# Patient Record
Sex: Female | Born: 1935 | Race: White | Hispanic: No | Smoking: Never smoker
Health system: Southern US, Community
[De-identification: ages and names within clinical notes are randomized; demographics above are authoritative.]

## PROBLEM LIST (undated history)

## (undated) DIAGNOSIS — I10 Essential (primary) hypertension: Secondary | ICD-10-CM

## (undated) DIAGNOSIS — J209 Acute bronchitis, unspecified: Secondary | ICD-10-CM

## (undated) DIAGNOSIS — R51 Headache: Secondary | ICD-10-CM

## (undated) DIAGNOSIS — C679 Malignant neoplasm of bladder, unspecified: Secondary | ICD-10-CM

## (undated) DIAGNOSIS — H109 Unspecified conjunctivitis: Secondary | ICD-10-CM

## (undated) DIAGNOSIS — M1990 Unspecified osteoarthritis, unspecified site: Secondary | ICD-10-CM

## (undated) DIAGNOSIS — I739 Peripheral vascular disease, unspecified: Secondary | ICD-10-CM

## (undated) DIAGNOSIS — D649 Anemia, unspecified: Secondary | ICD-10-CM

## (undated) DIAGNOSIS — R634 Abnormal weight loss: Secondary | ICD-10-CM

## (undated) DIAGNOSIS — I4430 Unspecified atrioventricular block: Secondary | ICD-10-CM

## (undated) DIAGNOSIS — J329 Chronic sinusitis, unspecified: Secondary | ICD-10-CM

## (undated) DIAGNOSIS — R296 Repeated falls: Secondary | ICD-10-CM

## (undated) DIAGNOSIS — G4700 Insomnia, unspecified: Secondary | ICD-10-CM

## (undated) DIAGNOSIS — D469 Myelodysplastic syndrome, unspecified: Secondary | ICD-10-CM

## (undated) DIAGNOSIS — N390 Urinary tract infection, site not specified: Secondary | ICD-10-CM

## (undated) DIAGNOSIS — S72142A Displaced intertrochanteric fracture of left femur, initial encounter for closed fracture: Secondary | ICD-10-CM

## (undated) DIAGNOSIS — F329 Major depressive disorder, single episode, unspecified: Secondary | ICD-10-CM

## (undated) DIAGNOSIS — T7840XA Allergy, unspecified, initial encounter: Secondary | ICD-10-CM

## (undated) DIAGNOSIS — E785 Hyperlipidemia, unspecified: Secondary | ICD-10-CM

## (undated) DIAGNOSIS — N189 Chronic kidney disease, unspecified: Secondary | ICD-10-CM

## (undated) DIAGNOSIS — S2249XA Multiple fractures of ribs, unspecified side, initial encounter for closed fracture: Secondary | ICD-10-CM

## (undated) DIAGNOSIS — N183 Chronic kidney disease, stage 3 unspecified: Secondary | ICD-10-CM

## (undated) DIAGNOSIS — Z9841 Cataract extraction status, right eye: Secondary | ICD-10-CM

## (undated) DIAGNOSIS — F419 Anxiety disorder, unspecified: Secondary | ICD-10-CM

## (undated) DIAGNOSIS — E039 Hypothyroidism, unspecified: Secondary | ICD-10-CM

## (undated) DIAGNOSIS — I2510 Atherosclerotic heart disease of native coronary artery without angina pectoris: Secondary | ICD-10-CM

## (undated) DIAGNOSIS — I6529 Occlusion and stenosis of unspecified carotid artery: Secondary | ICD-10-CM

## (undated) HISTORY — PX: CHOLECYSTECTOMY: SHX55

## (undated) HISTORY — DX: Myelodysplastic syndrome, unspecified: D46.9

## (undated) HISTORY — DX: Multiple fractures of ribs, unspecified side, initial encounter for closed fracture: S22.49XA

## (undated) HISTORY — DX: Unspecified osteoarthritis, unspecified site: M19.90

## (undated) HISTORY — DX: Chronic kidney disease, unspecified: N18.9

## (undated) HISTORY — DX: Malignant neoplasm of bladder, unspecified: C67.9

## (undated) HISTORY — DX: Urinary tract infection, site not specified: N39.0

## (undated) HISTORY — DX: Chronic sinusitis, unspecified: J32.9

## (undated) HISTORY — DX: Unspecified conjunctivitis: H10.9

## (undated) HISTORY — DX: Headache: R51

## (undated) HISTORY — DX: Cataract extraction status, right eye: Z98.41

## (undated) HISTORY — DX: Occlusion and stenosis of unspecified carotid artery: I65.29

## (undated) HISTORY — PX: SHOULDER OPEN ROTATOR CUFF REPAIR: SHX2407

## (undated) HISTORY — DX: Allergy, unspecified, initial encounter: T78.40XA

## (undated) HISTORY — DX: Major depressive disorder, single episode, unspecified: F32.9

## (undated) HISTORY — PX: PACEMAKER INSERTION: SHX728

## (undated) HISTORY — DX: Hyperlipidemia, unspecified: E78.5

## (undated) HISTORY — DX: Essential (primary) hypertension: I10

## (undated) HISTORY — DX: Acute bronchitis, unspecified: J20.9

## (undated) HISTORY — DX: Insomnia, unspecified: G47.00

## (undated) HISTORY — DX: Peripheral vascular disease, unspecified: I73.9

## (undated) HISTORY — DX: Anemia, unspecified: D64.9

## (undated) HISTORY — PX: ABDOMINAL HYSTERECTOMY: SHX81

## (undated) HISTORY — DX: Hypothyroidism, unspecified: E03.9

## (undated) HISTORY — DX: Anxiety disorder, unspecified: F41.9

## (undated) HISTORY — PX: OTHER SURGICAL HISTORY: SHX169

## (undated) HISTORY — DX: Abnormal weight loss: R63.4

## (undated) HISTORY — DX: Atherosclerotic heart disease of native coronary artery without angina pectoris: I25.10

## (undated) HISTORY — PX: FOOT SURGERY: SHX648

---

## 1999-01-28 ENCOUNTER — Encounter: Payer: Self-pay | Admitting: Cardiology

## 1999-01-28 ENCOUNTER — Ambulatory Visit (HOSPITAL_COMMUNITY): Admission: RE | Admit: 1999-01-28 | Discharge: 1999-01-28 | Payer: Self-pay | Admitting: Cardiology

## 1999-05-04 ENCOUNTER — Emergency Department (HOSPITAL_COMMUNITY): Admission: EM | Admit: 1999-05-04 | Discharge: 1999-05-04 | Payer: Self-pay | Admitting: *Deleted

## 1999-09-29 ENCOUNTER — Encounter: Admission: RE | Admit: 1999-09-29 | Discharge: 1999-09-29 | Payer: Self-pay | Admitting: Family Medicine

## 2000-08-31 ENCOUNTER — Encounter: Payer: Self-pay | Admitting: Cardiology

## 2000-08-31 ENCOUNTER — Ambulatory Visit (HOSPITAL_COMMUNITY): Admission: RE | Admit: 2000-08-31 | Discharge: 2000-08-31 | Payer: Self-pay | Admitting: Cardiology

## 2000-09-11 ENCOUNTER — Emergency Department (HOSPITAL_COMMUNITY): Admission: EM | Admit: 2000-09-11 | Discharge: 2000-09-11 | Payer: Self-pay | Admitting: Emergency Medicine

## 2000-10-11 ENCOUNTER — Encounter: Admission: RE | Admit: 2000-10-11 | Discharge: 2000-10-11 | Payer: Self-pay | Admitting: Family Medicine

## 2000-10-11 ENCOUNTER — Encounter: Payer: Self-pay | Admitting: Otolaryngology

## 2001-03-28 ENCOUNTER — Encounter: Payer: Self-pay | Admitting: Emergency Medicine

## 2001-03-28 ENCOUNTER — Emergency Department (HOSPITAL_COMMUNITY): Admission: EM | Admit: 2001-03-28 | Discharge: 2001-03-28 | Payer: Self-pay | Admitting: Emergency Medicine

## 2001-08-08 ENCOUNTER — Encounter: Payer: Self-pay | Admitting: Orthopedic Surgery

## 2001-08-08 ENCOUNTER — Encounter: Admission: RE | Admit: 2001-08-08 | Discharge: 2001-08-08 | Payer: Self-pay | Admitting: Orthopedic Surgery

## 2001-08-10 ENCOUNTER — Ambulatory Visit (HOSPITAL_BASED_OUTPATIENT_CLINIC_OR_DEPARTMENT_OTHER): Admission: RE | Admit: 2001-08-10 | Discharge: 2001-08-11 | Payer: Self-pay | Admitting: Orthopedic Surgery

## 2001-11-21 ENCOUNTER — Encounter: Admission: RE | Admit: 2001-11-21 | Discharge: 2001-11-21 | Payer: Self-pay | Admitting: Urology

## 2001-11-21 ENCOUNTER — Encounter: Payer: Self-pay | Admitting: Urology

## 2001-12-07 ENCOUNTER — Encounter (INDEPENDENT_AMBULATORY_CARE_PROVIDER_SITE_OTHER): Payer: Self-pay | Admitting: Specialist

## 2001-12-07 ENCOUNTER — Ambulatory Visit (HOSPITAL_BASED_OUTPATIENT_CLINIC_OR_DEPARTMENT_OTHER): Admission: RE | Admit: 2001-12-07 | Discharge: 2001-12-07 | Payer: Self-pay | Admitting: Urology

## 2002-03-08 ENCOUNTER — Encounter (INDEPENDENT_AMBULATORY_CARE_PROVIDER_SITE_OTHER): Payer: Self-pay | Admitting: *Deleted

## 2002-03-08 ENCOUNTER — Ambulatory Visit (HOSPITAL_BASED_OUTPATIENT_CLINIC_OR_DEPARTMENT_OTHER): Admission: RE | Admit: 2002-03-08 | Discharge: 2002-03-08 | Payer: Self-pay | Admitting: Urology

## 2002-03-26 ENCOUNTER — Encounter: Admission: RE | Admit: 2002-03-26 | Discharge: 2002-03-26 | Payer: Self-pay | Admitting: Urology

## 2002-03-26 ENCOUNTER — Encounter: Payer: Self-pay | Admitting: Urology

## 2002-06-14 ENCOUNTER — Encounter: Admission: RE | Admit: 2002-06-14 | Discharge: 2002-06-14 | Payer: Self-pay | Admitting: Family Medicine

## 2002-06-14 ENCOUNTER — Encounter: Payer: Self-pay | Admitting: Family Medicine

## 2002-12-16 DIAGNOSIS — C679 Malignant neoplasm of bladder, unspecified: Secondary | ICD-10-CM

## 2002-12-16 HISTORY — DX: Malignant neoplasm of bladder, unspecified: C67.9

## 2002-12-27 ENCOUNTER — Encounter: Payer: Self-pay | Admitting: Urology

## 2002-12-27 ENCOUNTER — Encounter (INDEPENDENT_AMBULATORY_CARE_PROVIDER_SITE_OTHER): Payer: Self-pay | Admitting: Specialist

## 2002-12-27 ENCOUNTER — Ambulatory Visit (HOSPITAL_BASED_OUTPATIENT_CLINIC_OR_DEPARTMENT_OTHER): Admission: RE | Admit: 2002-12-27 | Discharge: 2002-12-27 | Payer: Self-pay | Admitting: Urology

## 2003-12-10 ENCOUNTER — Encounter: Admission: RE | Admit: 2003-12-10 | Discharge: 2003-12-10 | Payer: Self-pay | Admitting: Family Medicine

## 2004-06-22 ENCOUNTER — Ambulatory Visit: Payer: Self-pay | Admitting: Internal Medicine

## 2005-10-01 ENCOUNTER — Ambulatory Visit: Payer: Self-pay | Admitting: Internal Medicine

## 2005-12-10 ENCOUNTER — Ambulatory Visit: Payer: Self-pay | Admitting: Internal Medicine

## 2006-01-11 ENCOUNTER — Ambulatory Visit: Payer: Self-pay | Admitting: Internal Medicine

## 2006-01-18 ENCOUNTER — Ambulatory Visit: Payer: Self-pay | Admitting: Internal Medicine

## 2006-03-11 ENCOUNTER — Ambulatory Visit: Payer: Self-pay | Admitting: Internal Medicine

## 2007-06-27 ENCOUNTER — Ambulatory Visit (HOSPITAL_COMMUNITY): Admission: RE | Admit: 2007-06-27 | Discharge: 2007-06-27 | Payer: Self-pay | Admitting: Family Medicine

## 2007-07-08 ENCOUNTER — Emergency Department (HOSPITAL_COMMUNITY): Admission: EM | Admit: 2007-07-08 | Discharge: 2007-07-08 | Payer: Self-pay | Admitting: Emergency Medicine

## 2007-07-11 ENCOUNTER — Encounter: Admission: RE | Admit: 2007-07-11 | Discharge: 2007-07-11 | Payer: Self-pay | Admitting: Family Medicine

## 2007-08-18 ENCOUNTER — Ambulatory Visit (HOSPITAL_COMMUNITY): Admission: RE | Admit: 2007-08-18 | Discharge: 2007-08-18 | Payer: Self-pay | Admitting: Family Medicine

## 2007-10-23 ENCOUNTER — Ambulatory Visit: Payer: Self-pay | Admitting: Cardiology

## 2007-10-23 ENCOUNTER — Inpatient Hospital Stay (HOSPITAL_COMMUNITY): Admission: AD | Admit: 2007-10-23 | Discharge: 2007-10-30 | Payer: Self-pay | Admitting: Family Medicine

## 2007-10-23 ENCOUNTER — Ambulatory Visit: Payer: Self-pay | Admitting: Oncology

## 2007-10-27 ENCOUNTER — Ambulatory Visit: Payer: Self-pay | Admitting: Oncology

## 2007-10-27 ENCOUNTER — Encounter: Payer: Self-pay | Admitting: Gastroenterology

## 2007-11-01 ENCOUNTER — Ambulatory Visit: Payer: Self-pay | Admitting: Gastroenterology

## 2007-11-05 ENCOUNTER — Encounter: Payer: Self-pay | Admitting: Gastroenterology

## 2007-11-08 ENCOUNTER — Ambulatory Visit: Payer: Self-pay | Admitting: Cardiology

## 2007-11-13 ENCOUNTER — Encounter: Payer: Self-pay | Admitting: Gastroenterology

## 2007-11-13 ENCOUNTER — Ambulatory Visit: Payer: Self-pay

## 2007-11-13 LAB — CBC & DIFF AND RETIC
Basophils Absolute: 0 10*3/uL (ref 0.0–0.1)
Eosinophils Absolute: 0.5 10*3/uL (ref 0.0–0.5)
HGB: 10.1 g/dL — ABNORMAL LOW (ref 11.6–15.9)
IRF: 0.5 — ABNORMAL HIGH (ref 0.130–0.330)
MCV: 101.7 fL — ABNORMAL HIGH (ref 81.0–101.0)
MONO#: 0.9 10*3/uL (ref 0.1–0.9)
NEUT#: 5.4 10*3/uL (ref 1.5–6.5)
RDW: 14.3 % (ref 11.3–14.5)
RETIC #: 107.8 10*3/uL (ref 19.7–115.1)
Retic %: 3.8 % — ABNORMAL HIGH (ref 0.4–2.3)
WBC: 8.1 10*3/uL (ref 3.9–10.0)
lymph#: 1.3 10*3/uL (ref 0.9–3.3)

## 2007-11-13 LAB — COMPREHENSIVE METABOLIC PANEL
ALT: 21 U/L (ref 0–35)
AST: 21 U/L (ref 0–37)
Alkaline Phosphatase: 40 U/L (ref 39–117)
CO2: 23 mEq/L (ref 19–32)
Creatinine, Ser: 1.46 mg/dL — ABNORMAL HIGH (ref 0.40–1.20)
Sodium: 140 mEq/L (ref 135–145)
Total Bilirubin: 0.4 mg/dL (ref 0.3–1.2)
Total Protein: 7.2 g/dL (ref 6.0–8.3)

## 2007-11-13 LAB — ERYTHROCYTE SEDIMENTATION RATE: Sed Rate: 49 mm/hr — ABNORMAL HIGH (ref 0–30)

## 2007-11-13 LAB — MORPHOLOGY

## 2007-11-13 LAB — CHCC SMEAR

## 2007-11-15 ENCOUNTER — Emergency Department (HOSPITAL_COMMUNITY): Admission: EM | Admit: 2007-11-15 | Discharge: 2007-11-15 | Payer: Self-pay | Admitting: Emergency Medicine

## 2007-11-23 ENCOUNTER — Other Ambulatory Visit: Admission: RE | Admit: 2007-11-23 | Discharge: 2007-11-23 | Payer: Self-pay | Admitting: Oncology

## 2007-11-23 ENCOUNTER — Encounter: Payer: Self-pay | Admitting: Oncology

## 2007-12-10 ENCOUNTER — Inpatient Hospital Stay (HOSPITAL_COMMUNITY): Admission: EM | Admit: 2007-12-10 | Discharge: 2007-12-11 | Payer: Self-pay | Admitting: Emergency Medicine

## 2007-12-10 ENCOUNTER — Ambulatory Visit: Payer: Self-pay | Admitting: *Deleted

## 2007-12-15 ENCOUNTER — Ambulatory Visit: Payer: Self-pay | Admitting: Oncology

## 2007-12-15 ENCOUNTER — Encounter: Payer: Self-pay | Admitting: Gastroenterology

## 2007-12-15 LAB — MORPHOLOGY

## 2007-12-15 LAB — CBC & DIFF AND RETIC
BASO%: 0.2 % (ref 0.0–2.0)
Eosinophils Absolute: 0.2 10*3/uL (ref 0.0–0.5)
MCV: 102.5 fL — ABNORMAL HIGH (ref 81.0–101.0)
MONO%: 9.2 % (ref 0.0–13.0)
NEUT#: 5.1 10*3/uL (ref 1.5–6.5)
RBC: 2.81 10*6/uL — ABNORMAL LOW (ref 3.70–5.32)
RDW: 14.9 % — ABNORMAL HIGH (ref 11.3–14.5)
RETIC #: 75.3 10*3/uL (ref 19.7–115.1)
Retic %: 2.7 % — ABNORMAL HIGH (ref 0.4–2.3)
WBC: 7.1 10*3/uL (ref 3.9–10.0)

## 2008-01-19 ENCOUNTER — Encounter: Payer: Self-pay | Admitting: Gastroenterology

## 2008-01-19 LAB — CBC & DIFF AND RETIC
Eosinophils Absolute: 0.2 10*3/uL (ref 0.0–0.5)
IRF: 0.35 — ABNORMAL HIGH (ref 0.130–0.330)
MCV: 100.3 fL (ref 81.0–101.0)
MONO#: 0.6 10*3/uL (ref 0.1–0.9)
MONO%: 9.6 % (ref 0.0–13.0)
NEUT#: 4.8 10*3/uL (ref 1.5–6.5)
RBC: 3.09 10*6/uL — ABNORMAL LOW (ref 3.70–5.32)
RDW: 14.7 % — ABNORMAL HIGH (ref 11.3–14.5)
RETIC #: 61.2 10*3/uL (ref 19.7–115.1)
Retic %: 2 % (ref 0.4–2.3)
WBC: 6.8 10*3/uL (ref 3.9–10.0)
lymph#: 1.1 10*3/uL (ref 0.9–3.3)

## 2008-01-19 LAB — MORPHOLOGY: PLT EST: DECREASED

## 2008-01-30 ENCOUNTER — Ambulatory Visit: Payer: Self-pay | Admitting: Internal Medicine

## 2008-02-04 ENCOUNTER — Emergency Department (HOSPITAL_BASED_OUTPATIENT_CLINIC_OR_DEPARTMENT_OTHER): Admission: EM | Admit: 2008-02-04 | Discharge: 2008-02-04 | Payer: Self-pay | Admitting: Emergency Medicine

## 2008-02-13 ENCOUNTER — Ambulatory Visit: Payer: Self-pay | Admitting: Cardiology

## 2008-02-21 ENCOUNTER — Ambulatory Visit: Payer: Self-pay | Admitting: Oncology

## 2008-02-23 LAB — CBC WITH DIFFERENTIAL/PLATELET
Basophils Absolute: 0 10*3/uL (ref 0.0–0.1)
Eosinophils Absolute: 0.2 10*3/uL (ref 0.0–0.5)
HCT: 30.8 % — ABNORMAL LOW (ref 34.8–46.6)
HGB: 10.3 g/dL — ABNORMAL LOW (ref 11.6–15.9)
MONO#: 0.6 10*3/uL (ref 0.1–0.9)
NEUT#: 5.9 10*3/uL (ref 1.5–6.5)
NEUT%: 73.8 % (ref 39.6–76.8)
RDW: 14.9 % — ABNORMAL HIGH (ref 11.3–14.5)
lymph#: 1.2 10*3/uL (ref 0.9–3.3)

## 2008-04-05 LAB — CBC WITH DIFFERENTIAL/PLATELET
Eosinophils Absolute: 0.4 10*3/uL (ref 0.0–0.5)
HCT: 31 % — ABNORMAL LOW (ref 34.8–46.6)
HGB: 10.5 g/dL — ABNORMAL LOW (ref 11.6–15.9)
LYMPH%: 22.4 % (ref 14.0–48.0)
MONO#: 0.6 10*3/uL (ref 0.1–0.9)
NEUT#: 4.4 10*3/uL (ref 1.5–6.5)
NEUT%: 62.5 % (ref 39.6–76.8)
Platelets: 150 10*3/uL (ref 145–400)
WBC: 7.1 10*3/uL (ref 3.9–10.0)
lymph#: 1.6 10*3/uL (ref 0.9–3.3)

## 2008-04-05 LAB — MORPHOLOGY

## 2008-04-05 LAB — IRON AND TIBC: %SAT: 18 % — ABNORMAL LOW (ref 20–55)

## 2008-04-05 LAB — FERRITIN: Ferritin: 32 ng/mL (ref 10–291)

## 2008-05-06 ENCOUNTER — Ambulatory Visit (HOSPITAL_COMMUNITY): Admission: RE | Admit: 2008-05-06 | Discharge: 2008-05-06 | Payer: Self-pay | Admitting: Family Medicine

## 2008-06-28 ENCOUNTER — Ambulatory Visit: Payer: Self-pay | Admitting: Oncology

## 2008-07-02 LAB — CBC WITH DIFFERENTIAL/PLATELET
Basophils Absolute: 0 10*3/uL (ref 0.0–0.1)
EOS%: 3.4 % (ref 0.0–7.0)
Eosinophils Absolute: 0.3 10*3/uL (ref 0.0–0.5)
HCT: 34.5 % — ABNORMAL LOW (ref 34.8–46.6)
HGB: 11.4 g/dL — ABNORMAL LOW (ref 11.6–15.9)
LYMPH%: 16.6 % (ref 14.0–48.0)
MCH: 33.9 pg (ref 26.0–34.0)
MCV: 102.5 fL — ABNORMAL HIGH (ref 81.0–101.0)
MONO%: 7.4 % (ref 0.0–13.0)
NEUT%: 72.4 % (ref 39.6–76.8)
Platelets: 151 10*3/uL (ref 145–400)
RDW: 14.1 % (ref 11.3–14.5)

## 2008-07-02 LAB — MORPHOLOGY: PLT EST: ADEQUATE

## 2008-08-30 ENCOUNTER — Encounter (INDEPENDENT_AMBULATORY_CARE_PROVIDER_SITE_OTHER): Payer: Self-pay | Admitting: *Deleted

## 2008-09-20 ENCOUNTER — Ambulatory Visit: Payer: Self-pay | Admitting: Oncology

## 2008-09-24 LAB — CBC & DIFF AND RETIC
BASO%: 0.4 % (ref 0.0–2.0)
LYMPH%: 13.7 % — ABNORMAL LOW (ref 14.0–49.7)
MCHC: 33.2 g/dL (ref 31.5–36.0)
MCV: 101.8 fL — ABNORMAL HIGH (ref 79.5–101.0)
MONO%: 6.3 % (ref 0.0–14.0)
Platelets: 155 10*3/uL (ref 145–400)
RBC: 3.24 10*6/uL — ABNORMAL LOW (ref 3.70–5.45)
Retic %: 1.9 % (ref 0.4–2.3)

## 2008-09-24 LAB — MORPHOLOGY: RBC Comments: NORMAL

## 2008-09-25 LAB — FERRITIN: Ferritin: 37 ng/mL (ref 10–291)

## 2008-09-25 LAB — COMPREHENSIVE METABOLIC PANEL
Albumin: 4.4 g/dL (ref 3.5–5.2)
Alkaline Phosphatase: 38 U/L — ABNORMAL LOW (ref 39–117)
BUN: 18 mg/dL (ref 6–23)
CO2: 22 mEq/L (ref 19–32)
Glucose, Bld: 167 mg/dL — ABNORMAL HIGH (ref 70–99)
Potassium: 4.1 mEq/L (ref 3.5–5.3)

## 2008-09-25 LAB — IRON AND TIBC
Iron: 71 ug/dL (ref 42–145)
UIBC: 278 ug/dL

## 2008-09-25 LAB — LACTATE DEHYDROGENASE: LDH: 186 U/L (ref 94–250)

## 2008-09-27 ENCOUNTER — Encounter: Payer: Self-pay | Admitting: Cardiology

## 2008-10-08 ENCOUNTER — Ambulatory Visit (HOSPITAL_COMMUNITY): Admission: RE | Admit: 2008-10-08 | Discharge: 2008-10-08 | Payer: Self-pay | Admitting: Oncology

## 2008-10-25 ENCOUNTER — Encounter: Payer: Self-pay | Admitting: Cardiology

## 2008-10-31 DIAGNOSIS — N189 Chronic kidney disease, unspecified: Secondary | ICD-10-CM | POA: Insufficient documentation

## 2008-10-31 DIAGNOSIS — E785 Hyperlipidemia, unspecified: Secondary | ICD-10-CM | POA: Insufficient documentation

## 2008-10-31 DIAGNOSIS — I739 Peripheral vascular disease, unspecified: Secondary | ICD-10-CM

## 2008-10-31 DIAGNOSIS — I10 Essential (primary) hypertension: Secondary | ICD-10-CM | POA: Insufficient documentation

## 2008-10-31 DIAGNOSIS — I6529 Occlusion and stenosis of unspecified carotid artery: Secondary | ICD-10-CM

## 2008-10-31 DIAGNOSIS — E039 Hypothyroidism, unspecified: Secondary | ICD-10-CM | POA: Insufficient documentation

## 2008-11-03 DIAGNOSIS — I951 Orthostatic hypotension: Secondary | ICD-10-CM | POA: Insufficient documentation

## 2008-11-05 ENCOUNTER — Ambulatory Visit: Payer: Self-pay | Admitting: Internal Medicine

## 2008-11-05 DIAGNOSIS — I472 Ventricular tachycardia: Secondary | ICD-10-CM

## 2008-11-05 DIAGNOSIS — I442 Atrioventricular block, complete: Secondary | ICD-10-CM | POA: Insufficient documentation

## 2008-11-13 ENCOUNTER — Telehealth (INDEPENDENT_AMBULATORY_CARE_PROVIDER_SITE_OTHER): Payer: Self-pay | Admitting: *Deleted

## 2008-11-14 ENCOUNTER — Encounter: Payer: Self-pay | Admitting: Internal Medicine

## 2008-11-14 ENCOUNTER — Ambulatory Visit: Payer: Self-pay

## 2008-12-16 ENCOUNTER — Emergency Department (HOSPITAL_BASED_OUTPATIENT_CLINIC_OR_DEPARTMENT_OTHER): Admission: EM | Admit: 2008-12-16 | Discharge: 2008-12-16 | Payer: Self-pay | Admitting: Emergency Medicine

## 2008-12-16 ENCOUNTER — Ambulatory Visit: Payer: Self-pay | Admitting: Diagnostic Radiology

## 2008-12-20 ENCOUNTER — Ambulatory Visit: Payer: Self-pay | Admitting: Oncology

## 2008-12-24 LAB — CBC WITH DIFFERENTIAL/PLATELET
BASO%: 0.2 % (ref 0.0–2.0)
EOS%: 4.4 % (ref 0.0–7.0)
LYMPH%: 13.6 % — ABNORMAL LOW (ref 14.0–49.7)
MCHC: 33.2 g/dL (ref 31.5–36.0)
MCV: 99.8 fL (ref 79.5–101.0)
MONO%: 7.2 % (ref 0.0–14.0)
Platelets: 168 10*3/uL (ref 145–400)
RBC: 2.91 10*6/uL — ABNORMAL LOW (ref 3.70–5.45)
WBC: 8.6 10*3/uL (ref 3.9–10.3)

## 2009-02-03 ENCOUNTER — Telehealth (INDEPENDENT_AMBULATORY_CARE_PROVIDER_SITE_OTHER): Payer: Self-pay | Admitting: *Deleted

## 2009-02-21 ENCOUNTER — Encounter (INDEPENDENT_AMBULATORY_CARE_PROVIDER_SITE_OTHER): Payer: Self-pay | Admitting: *Deleted

## 2009-03-18 ENCOUNTER — Encounter (INDEPENDENT_AMBULATORY_CARE_PROVIDER_SITE_OTHER): Payer: Self-pay | Admitting: *Deleted

## 2009-03-24 ENCOUNTER — Ambulatory Visit: Payer: Self-pay | Admitting: Oncology

## 2009-03-26 ENCOUNTER — Encounter: Payer: Self-pay | Admitting: Cardiology

## 2009-03-26 LAB — COMPREHENSIVE METABOLIC PANEL
ALT: 15 U/L (ref 0–35)
AST: 16 U/L (ref 0–37)
Albumin: 4.2 g/dL (ref 3.5–5.2)
Alkaline Phosphatase: 36 U/L — ABNORMAL LOW (ref 39–117)
BUN: 11 mg/dL (ref 6–23)
Potassium: 3.8 mEq/L (ref 3.5–5.3)
Sodium: 142 mEq/L (ref 135–145)

## 2009-03-26 LAB — CBC WITH DIFFERENTIAL/PLATELET
BASO%: 0.3 % (ref 0.0–2.0)
Basophils Absolute: 0 10*3/uL (ref 0.0–0.1)
EOS%: 3.7 % (ref 0.0–7.0)
MCH: 32.6 pg (ref 25.1–34.0)
MCHC: 32.4 g/dL (ref 31.5–36.0)
MCV: 100.7 fL (ref 79.5–101.0)
MONO%: 8.1 % (ref 0.0–14.0)
RBC: 2.92 10*6/uL — ABNORMAL LOW (ref 3.70–5.45)
RDW: 15.4 % — ABNORMAL HIGH (ref 11.2–14.5)

## 2009-04-02 ENCOUNTER — Encounter: Payer: Self-pay | Admitting: Internal Medicine

## 2009-04-02 ENCOUNTER — Ambulatory Visit: Payer: Self-pay

## 2009-04-09 LAB — CBC WITH DIFFERENTIAL/PLATELET
Basophils Absolute: 0 10*3/uL (ref 0.0–0.1)
Eosinophils Absolute: 0.3 10*3/uL (ref 0.0–0.5)
LYMPH%: 17.3 % (ref 14.0–49.7)
MCV: 99 fL (ref 79.5–101.0)
MONO%: 8.8 % (ref 0.0–14.0)
NEUT#: 5.4 10*3/uL (ref 1.5–6.5)
Platelets: 134 10*3/uL — ABNORMAL LOW (ref 145–400)
RBC: 2.96 10*6/uL — ABNORMAL LOW (ref 3.70–5.45)

## 2009-04-23 ENCOUNTER — Ambulatory Visit: Payer: Self-pay | Admitting: Oncology

## 2009-04-23 LAB — CBC WITH DIFFERENTIAL/PLATELET
Basophils Absolute: 0 10*3/uL (ref 0.0–0.1)
EOS%: 5.1 % (ref 0.0–7.0)
HCT: 30.9 % — ABNORMAL LOW (ref 34.8–46.6)
HGB: 9.5 g/dL — ABNORMAL LOW (ref 11.6–15.9)
LYMPH%: 18.2 % (ref 14.0–49.7)
MCH: 30.2 pg (ref 25.1–34.0)
MCHC: 30.7 g/dL — ABNORMAL LOW (ref 31.5–36.0)
MONO#: 0.8 10*3/uL (ref 0.1–0.9)
NEUT%: 65.1 % (ref 38.4–76.8)
Platelets: 139 10*3/uL — ABNORMAL LOW (ref 145–400)
lymph#: 1.3 10*3/uL (ref 0.9–3.3)

## 2009-05-07 LAB — CBC WITH DIFFERENTIAL/PLATELET
BASO%: 0.4 % (ref 0.0–2.0)
Basophils Absolute: 0 10*3/uL (ref 0.0–0.1)
EOS%: 5.1 % (ref 0.0–7.0)
Eosinophils Absolute: 0.4 10*3/uL (ref 0.0–0.5)
HCT: 32.9 % — ABNORMAL LOW (ref 34.8–46.6)
HGB: 10 g/dL — ABNORMAL LOW (ref 11.6–15.9)
LYMPH%: 13.5 % — ABNORMAL LOW (ref 14.0–49.7)
MCH: 29.3 pg (ref 25.1–34.0)
MCHC: 30.4 g/dL — ABNORMAL LOW (ref 31.5–36.0)
MCV: 96.5 fL (ref 79.5–101.0)
MONO#: 0.7 10*3/uL (ref 0.1–0.9)
MONO%: 8.2 % (ref 0.0–14.0)
NEUT#: 6.1 10*3/uL (ref 1.5–6.5)
NEUT%: 72.8 % (ref 38.4–76.8)
Platelets: 162 10*3/uL (ref 145–400)
RBC: 3.41 10*6/uL — ABNORMAL LOW (ref 3.70–5.45)
RDW: 15.1 % — ABNORMAL HIGH (ref 11.2–14.5)
WBC: 8.4 10*3/uL (ref 3.9–10.3)
lymph#: 1.1 10*3/uL (ref 0.9–3.3)

## 2009-05-15 ENCOUNTER — Ambulatory Visit: Payer: Self-pay | Admitting: Oncology

## 2009-05-21 LAB — CBC WITH DIFFERENTIAL/PLATELET
BASO%: 0.2 % (ref 0.0–2.0)
EOS%: 5.1 % (ref 0.0–7.0)
HCT: 34.3 % — ABNORMAL LOW (ref 34.8–46.6)
HGB: 10.7 g/dL — ABNORMAL LOW (ref 11.6–15.9)
MCH: 29.6 pg (ref 25.1–34.0)
MONO#: 0.8 10*3/uL (ref 0.1–0.9)
NEUT#: 6.2 10*3/uL (ref 1.5–6.5)
NEUT%: 69.1 % (ref 38.4–76.8)
RDW: 15.7 % — ABNORMAL HIGH (ref 11.2–14.5)
lymph#: 1.6 10*3/uL (ref 0.9–3.3)

## 2009-06-04 LAB — CBC WITH DIFFERENTIAL/PLATELET
Basophils Absolute: 0 10*3/uL (ref 0.0–0.1)
EOS%: 6.1 % (ref 0.0–7.0)
Eosinophils Absolute: 0.5 10*3/uL (ref 0.0–0.5)
HCT: 29.5 % — ABNORMAL LOW (ref 34.8–46.6)
HGB: 9.6 g/dL — ABNORMAL LOW (ref 11.6–15.9)
MCH: 30.6 pg (ref 25.1–34.0)
MCV: 94.3 fL (ref 79.5–101.0)
NEUT#: 6.6 10*3/uL — ABNORMAL HIGH (ref 1.5–6.5)
NEUT%: 73.8 % (ref 38.4–76.8)
lymph#: 1.2 10*3/uL (ref 0.9–3.3)

## 2009-06-16 ENCOUNTER — Ambulatory Visit: Payer: Self-pay | Admitting: Oncology

## 2009-06-18 LAB — CBC WITH DIFFERENTIAL/PLATELET
Basophils Absolute: 0 10*3/uL (ref 0.0–0.1)
EOS%: 5.3 % (ref 0.0–7.0)
HGB: 10.2 g/dL — ABNORMAL LOW (ref 11.6–15.9)
LYMPH%: 12 % — ABNORMAL LOW (ref 14.0–49.7)
MCH: 28.7 pg (ref 25.1–34.0)
MCV: 95.8 fL (ref 79.5–101.0)
MONO%: 9.4 % (ref 0.0–14.0)
NEUT%: 73.1 % (ref 38.4–76.8)
Platelets: 157 10*3/uL (ref 145–400)
RDW: 16.8 % — ABNORMAL HIGH (ref 11.2–14.5)

## 2009-06-25 ENCOUNTER — Encounter: Payer: Self-pay | Admitting: Cardiology

## 2009-07-02 ENCOUNTER — Encounter: Payer: Self-pay | Admitting: Cardiology

## 2009-07-02 LAB — CBC WITH DIFFERENTIAL/PLATELET
BASO%: 0.3 % (ref 0.0–2.0)
EOS%: 4.1 % (ref 0.0–7.0)
HCT: 34 % — ABNORMAL LOW (ref 34.8–46.6)
LYMPH%: 18.6 % (ref 14.0–49.7)
MCH: 30.3 pg (ref 25.1–34.0)
MCHC: 32.6 g/dL (ref 31.5–36.0)
MCV: 93.1 fL (ref 79.5–101.0)
MONO%: 9.5 % (ref 0.0–14.0)
NEUT%: 67.5 % (ref 38.4–76.8)
Platelets: 190 10*3/uL (ref 145–400)
lymph#: 1.4 10*3/uL (ref 0.9–3.3)

## 2009-07-14 ENCOUNTER — Ambulatory Visit: Payer: Self-pay | Admitting: Oncology

## 2009-07-16 ENCOUNTER — Encounter: Payer: Self-pay | Admitting: Cardiology

## 2009-07-16 LAB — CBC WITH DIFFERENTIAL/PLATELET
BASO%: 0.3 % (ref 0.0–2.0)
Basophils Absolute: 0 10*3/uL (ref 0.0–0.1)
HCT: 37.5 % (ref 34.8–46.6)
LYMPH%: 19.2 % (ref 14.0–49.7)
MCH: 29.2 pg (ref 25.1–34.0)
MCHC: 30.7 g/dL — ABNORMAL LOW (ref 31.5–36.0)
MONO#: 1.1 10*3/uL — ABNORMAL HIGH (ref 0.1–0.9)
NEUT%: 63 % (ref 38.4–76.8)
Platelets: 112 10*3/uL — ABNORMAL LOW (ref 145–400)
WBC: 8.7 10*3/uL (ref 3.9–10.3)

## 2009-09-09 ENCOUNTER — Ambulatory Visit: Payer: Self-pay | Admitting: Oncology

## 2009-09-12 ENCOUNTER — Encounter: Payer: Self-pay | Admitting: Cardiology

## 2009-09-16 ENCOUNTER — Encounter: Payer: Self-pay | Admitting: Internal Medicine

## 2009-09-16 LAB — CBC WITH DIFFERENTIAL/PLATELET
EOS%: 4.2 % (ref 0.0–7.0)
Eosinophils Absolute: 0.3 10*3/uL (ref 0.0–0.5)
MCV: 101.2 fL — ABNORMAL HIGH (ref 79.5–101.0)
MONO%: 9.6 % (ref 0.0–14.0)
NEUT#: 4.6 10*3/uL (ref 1.5–6.5)
RBC: 2.68 10*6/uL — ABNORMAL LOW (ref 3.70–5.45)
RDW: 18.4 % — ABNORMAL HIGH (ref 11.2–14.5)

## 2009-09-26 ENCOUNTER — Encounter: Payer: Self-pay | Admitting: Cardiology

## 2009-09-30 ENCOUNTER — Encounter: Payer: Self-pay | Admitting: Cardiology

## 2009-09-30 LAB — CBC WITH DIFFERENTIAL/PLATELET
Eosinophils Absolute: 0.4 10*3/uL (ref 0.0–0.5)
MONO#: 0.7 10*3/uL (ref 0.1–0.9)
NEUT#: 4.9 10*3/uL (ref 1.5–6.5)
RBC: 3.04 10*6/uL — ABNORMAL LOW (ref 3.70–5.45)
RDW: 15.7 % — ABNORMAL HIGH (ref 11.2–14.5)
WBC: 7.1 10*3/uL (ref 3.9–10.3)

## 2009-10-10 ENCOUNTER — Ambulatory Visit: Payer: Self-pay | Admitting: Oncology

## 2009-10-14 LAB — CBC WITH DIFFERENTIAL/PLATELET
Eosinophils Absolute: 0.3 10*3/uL (ref 0.0–0.5)
HCT: 31.9 % — ABNORMAL LOW (ref 34.8–46.6)
LYMPH%: 14.8 % (ref 14.0–49.7)
MONO#: 1 10*3/uL — ABNORMAL HIGH (ref 0.1–0.9)
NEUT#: 6.7 10*3/uL — ABNORMAL HIGH (ref 1.5–6.5)
NEUT%: 70.4 % (ref 38.4–76.8)
Platelets: 133 10*3/uL — ABNORMAL LOW (ref 145–400)
WBC: 9.5 10*3/uL (ref 3.9–10.3)

## 2009-10-28 LAB — CBC WITH DIFFERENTIAL/PLATELET
Basophils Absolute: 0.1 10*3/uL (ref 0.0–0.1)
EOS%: 4.9 % (ref 0.0–7.0)
HCT: 28.9 % — ABNORMAL LOW (ref 34.8–46.6)
HGB: 9.5 g/dL — ABNORMAL LOW (ref 11.6–15.9)
LYMPH%: 13.5 % — ABNORMAL LOW (ref 14.0–49.7)
MCH: 32.3 pg (ref 25.1–34.0)
MCHC: 32.8 g/dL (ref 31.5–36.0)
MCV: 98.2 fL (ref 79.5–101.0)
MONO%: 7.2 % (ref 0.0–14.0)
NEUT%: 73.8 % (ref 38.4–76.8)
Platelets: 141 10*3/uL — ABNORMAL LOW (ref 145–400)

## 2009-10-30 ENCOUNTER — Encounter: Payer: Self-pay | Admitting: Cardiology

## 2009-10-31 ENCOUNTER — Ambulatory Visit: Payer: Self-pay | Admitting: Internal Medicine

## 2009-11-11 ENCOUNTER — Ambulatory Visit: Payer: Self-pay | Admitting: Oncology

## 2009-11-11 LAB — CBC WITH DIFFERENTIAL/PLATELET
BASO%: 0.4 % (ref 0.0–2.0)
Basophils Absolute: 0 10*3/uL (ref 0.0–0.1)
EOS%: 5.3 % (ref 0.0–7.0)
HGB: 10.1 g/dL — ABNORMAL LOW (ref 11.6–15.9)
MCH: 32.1 pg (ref 25.1–34.0)
MCHC: 33.2 g/dL (ref 31.5–36.0)
MCV: 96.7 fL (ref 79.5–101.0)
MONO%: 8.2 % (ref 0.0–14.0)
RBC: 3.14 10*6/uL — ABNORMAL LOW (ref 3.70–5.45)
RDW: 16 % — ABNORMAL HIGH (ref 11.2–14.5)

## 2009-11-12 ENCOUNTER — Encounter: Admission: RE | Admit: 2009-11-12 | Discharge: 2009-11-12 | Payer: Self-pay | Admitting: Family Medicine

## 2009-11-25 LAB — CBC WITH DIFFERENTIAL/PLATELET
Basophils Absolute: 0 10*3/uL (ref 0.0–0.1)
Eosinophils Absolute: 0.1 10*3/uL (ref 0.0–0.5)
HGB: 10.2 g/dL — ABNORMAL LOW (ref 11.6–15.9)
LYMPH%: 13.8 % — ABNORMAL LOW (ref 14.0–49.7)
MCV: 97.2 fL (ref 79.5–101.0)
MONO#: 1.1 10*3/uL — ABNORMAL HIGH (ref 0.1–0.9)
NEUT#: 7.3 10*3/uL — ABNORMAL HIGH (ref 1.5–6.5)
Platelets: 197 10*3/uL (ref 145–400)
RBC: 3.25 10*6/uL — ABNORMAL LOW (ref 3.70–5.45)
RDW: 16.7 % — ABNORMAL HIGH (ref 11.2–14.5)
WBC: 9.9 10*3/uL (ref 3.9–10.3)

## 2009-12-09 LAB — CBC WITH DIFFERENTIAL/PLATELET
Eosinophils Absolute: 0.2 10*3/uL (ref 0.0–0.5)
HCT: 32.2 % — ABNORMAL LOW (ref 34.8–46.6)
LYMPH%: 11.1 % — ABNORMAL LOW (ref 14.0–49.7)
MCV: 98.2 fL (ref 79.5–101.0)
MONO#: 1.2 10*3/uL — ABNORMAL HIGH (ref 0.1–0.9)
MONO%: 11 % (ref 0.0–14.0)
NEUT#: 8.5 10*3/uL — ABNORMAL HIGH (ref 1.5–6.5)
NEUT%: 75.7 % (ref 38.4–76.8)
Platelets: 150 10*3/uL (ref 145–400)
WBC: 11.2 10*3/uL — ABNORMAL HIGH (ref 3.9–10.3)

## 2009-12-19 ENCOUNTER — Ambulatory Visit: Payer: Self-pay | Admitting: Oncology

## 2009-12-23 LAB — CBC WITH DIFFERENTIAL/PLATELET
BASO%: 0.5 % (ref 0.0–2.0)
Basophils Absolute: 0 10*3/uL (ref 0.0–0.1)
EOS%: 3.2 % (ref 0.0–7.0)
HGB: 9.6 g/dL — ABNORMAL LOW (ref 11.6–15.9)
MCH: 29 pg (ref 25.1–34.0)
MCHC: 30.4 g/dL — ABNORMAL LOW (ref 31.5–36.0)
MCV: 95.5 fL (ref 79.5–101.0)
MONO%: 10.7 % (ref 0.0–14.0)
RBC: 3.31 10*6/uL — ABNORMAL LOW (ref 3.70–5.45)
RDW: 16.5 % — ABNORMAL HIGH (ref 11.2–14.5)
lymph#: 1.3 10*3/uL (ref 0.9–3.3)

## 2009-12-26 ENCOUNTER — Telehealth: Payer: Self-pay | Admitting: Internal Medicine

## 2009-12-29 ENCOUNTER — Ambulatory Visit: Payer: Self-pay

## 2009-12-29 ENCOUNTER — Encounter: Payer: Self-pay | Admitting: Internal Medicine

## 2009-12-29 ENCOUNTER — Telehealth (INDEPENDENT_AMBULATORY_CARE_PROVIDER_SITE_OTHER): Payer: Self-pay | Admitting: *Deleted

## 2010-01-05 ENCOUNTER — Ambulatory Visit: Payer: Self-pay | Admitting: Internal Medicine

## 2010-01-05 DIAGNOSIS — IMO0002 Reserved for concepts with insufficient information to code with codable children: Secondary | ICD-10-CM

## 2010-01-06 LAB — CBC WITH DIFFERENTIAL/PLATELET
Basophils Absolute: 0 10*3/uL (ref 0.0–0.1)
Eosinophils Absolute: 0.4 10*3/uL (ref 0.0–0.5)
HGB: 10.5 g/dL — ABNORMAL LOW (ref 11.6–15.9)
MCV: 95.2 fL (ref 79.5–101.0)
MONO#: 0.9 10*3/uL (ref 0.1–0.9)
MONO%: 9.9 % (ref 0.0–14.0)
NEUT#: 5.8 10*3/uL (ref 1.5–6.5)
RBC: 3.54 10*6/uL — ABNORMAL LOW (ref 3.70–5.45)
RDW: 17.2 % — ABNORMAL HIGH (ref 11.2–14.5)
WBC: 8.6 10*3/uL (ref 3.9–10.3)
lymph#: 1.6 10*3/uL (ref 0.9–3.3)

## 2010-01-20 ENCOUNTER — Ambulatory Visit: Payer: Self-pay | Admitting: Oncology

## 2010-01-20 LAB — CBC WITH DIFFERENTIAL/PLATELET
BASO%: 0.2 % (ref 0.0–2.0)
Basophils Absolute: 0 10*3/uL (ref 0.0–0.1)
EOS%: 5.5 % (ref 0.0–7.0)
HCT: 30.1 % — ABNORMAL LOW (ref 34.8–46.6)
HGB: 9.8 g/dL — ABNORMAL LOW (ref 11.6–15.9)
MCH: 30.4 pg (ref 25.1–34.0)
MCHC: 32.4 g/dL (ref 31.5–36.0)
MONO#: 0.7 10*3/uL (ref 0.1–0.9)
RDW: 18.6 % — ABNORMAL HIGH (ref 11.2–14.5)
WBC: 9.3 10*3/uL (ref 3.9–10.3)
lymph#: 1.4 10*3/uL (ref 0.9–3.3)

## 2010-01-20 LAB — MORPHOLOGY

## 2010-01-20 LAB — COMPREHENSIVE METABOLIC PANEL
Alkaline Phosphatase: 28 U/L — ABNORMAL LOW (ref 39–117)
BUN: 21 mg/dL (ref 6–23)
CO2: 29 mEq/L (ref 19–32)
Creatinine, Ser: 1.29 mg/dL — ABNORMAL HIGH (ref 0.40–1.20)
Glucose, Bld: 88 mg/dL (ref 70–99)
Total Bilirubin: 0.6 mg/dL (ref 0.3–1.2)

## 2010-01-20 LAB — LACTATE DEHYDROGENASE: LDH: 211 U/L (ref 94–250)

## 2010-01-21 ENCOUNTER — Telehealth: Payer: Self-pay | Admitting: Internal Medicine

## 2010-01-29 ENCOUNTER — Ambulatory Visit: Payer: Self-pay | Admitting: Internal Medicine

## 2010-01-30 LAB — CONVERTED CEMR LAB
BUN: 25 mg/dL — ABNORMAL HIGH (ref 6–23)
Sodium: 142 meq/L (ref 135–145)

## 2010-02-03 LAB — CBC WITH DIFFERENTIAL/PLATELET
BASO%: 0.4 % (ref 0.0–2.0)
Basophils Absolute: 0 10*3/uL (ref 0.0–0.1)
Eosinophils Absolute: 1 10*3/uL — ABNORMAL HIGH (ref 0.0–0.5)
HCT: 34.5 % — ABNORMAL LOW (ref 34.8–46.6)
HGB: 10.6 g/dL — ABNORMAL LOW (ref 11.6–15.9)
MCHC: 30.7 g/dL — ABNORMAL LOW (ref 31.5–36.0)
MONO#: 0.9 10*3/uL (ref 0.1–0.9)
NEUT#: 6.4 10*3/uL (ref 1.5–6.5)
NEUT%: 65.6 % (ref 38.4–76.8)
Platelets: 143 10*3/uL — ABNORMAL LOW (ref 145–400)
WBC: 9.7 10*3/uL (ref 3.9–10.3)
lymph#: 1.4 10*3/uL (ref 0.9–3.3)

## 2010-02-11 ENCOUNTER — Encounter: Payer: Self-pay | Admitting: Cardiology

## 2010-02-17 LAB — CBC WITH DIFFERENTIAL/PLATELET
Basophils Absolute: 0 10*3/uL (ref 0.0–0.1)
EOS%: 5.1 % (ref 0.0–7.0)
Eosinophils Absolute: 0.5 10*3/uL (ref 0.0–0.5)
HGB: 10.1 g/dL — ABNORMAL LOW (ref 11.6–15.9)
LYMPH%: 11.4 % — ABNORMAL LOW (ref 14.0–49.7)
MCH: 29.4 pg (ref 25.1–34.0)
MCV: 95.9 fL (ref 79.5–101.0)
MONO%: 10 % (ref 0.0–14.0)
NEUT%: 73.2 % (ref 38.4–76.8)
Platelets: 143 10*3/uL — ABNORMAL LOW (ref 145–400)
RDW: 16.6 % — ABNORMAL HIGH (ref 11.2–14.5)

## 2010-02-23 ENCOUNTER — Telehealth: Payer: Self-pay | Admitting: Internal Medicine

## 2010-02-24 ENCOUNTER — Ambulatory Visit: Payer: Self-pay | Admitting: Internal Medicine

## 2010-02-24 LAB — CONVERTED CEMR LAB
Calcium: 9.6 mg/dL (ref 8.4–10.5)
Chloride: 100 meq/L (ref 96–112)
Glucose, Bld: 135 mg/dL — ABNORMAL HIGH (ref 70–99)

## 2010-02-27 ENCOUNTER — Ambulatory Visit: Payer: Self-pay | Admitting: Oncology

## 2010-03-03 LAB — CBC WITH DIFFERENTIAL/PLATELET
BASO%: 0.4 % (ref 0.0–2.0)
EOS%: 4.9 % (ref 0.0–7.0)
HCT: 28.9 % — ABNORMAL LOW (ref 34.8–46.6)
LYMPH%: 15.6 % (ref 14.0–49.7)
MCH: 31 pg (ref 25.1–34.0)
MCHC: 33 g/dL (ref 31.5–36.0)
MCV: 93.7 fL (ref 79.5–101.0)
MONO%: 9.4 % (ref 0.0–14.0)
NEUT%: 69.7 % (ref 38.4–76.8)
Platelets: 136 10*3/uL — ABNORMAL LOW (ref 145–400)
RBC: 3.08 10*6/uL — ABNORMAL LOW (ref 3.70–5.45)
WBC: 7.6 10*3/uL (ref 3.9–10.3)

## 2010-03-05 ENCOUNTER — Ambulatory Visit (HOSPITAL_COMMUNITY)
Admission: RE | Admit: 2010-03-05 | Discharge: 2010-03-05 | Payer: Self-pay | Source: Home / Self Care | Admitting: Family Medicine

## 2010-03-17 LAB — CBC WITH DIFFERENTIAL/PLATELET
BASO%: 0.4 % (ref 0.0–2.0)
Eosinophils Absolute: 0.3 10*3/uL (ref 0.0–0.5)
HCT: 33.2 % — ABNORMAL LOW (ref 34.8–46.6)
LYMPH%: 11.1 % — ABNORMAL LOW (ref 14.0–49.7)
MCHC: 30.4 g/dL — ABNORMAL LOW (ref 31.5–36.0)
MCV: 95.1 fL (ref 79.5–101.0)
MONO%: 7.4 % (ref 0.0–14.0)
NEUT%: 78.4 % — ABNORMAL HIGH (ref 38.4–76.8)
Platelets: 144 10*3/uL — ABNORMAL LOW (ref 145–400)
RBC: 3.49 10*6/uL — ABNORMAL LOW (ref 3.70–5.45)
nRBC: 0 % (ref 0–0)

## 2010-03-31 ENCOUNTER — Ambulatory Visit: Payer: Self-pay | Admitting: Oncology

## 2010-03-31 LAB — CBC WITH DIFFERENTIAL/PLATELET
BASO%: 0.3 % (ref 0.0–2.0)
Basophils Absolute: 0 10*3/uL (ref 0.0–0.1)
EOS%: 3.8 % (ref 0.0–7.0)
HCT: 31.5 % — ABNORMAL LOW (ref 34.8–46.6)
HGB: 9.6 g/dL — ABNORMAL LOW (ref 11.6–15.9)
MCH: 28.9 pg (ref 25.1–34.0)
MCHC: 30.5 g/dL — ABNORMAL LOW (ref 31.5–36.0)
MCV: 94.9 fL (ref 79.5–101.0)
MONO%: 7.8 % (ref 0.0–14.0)
NEUT%: 73.9 % (ref 38.4–76.8)
lymph#: 1.6 10*3/uL (ref 0.9–3.3)

## 2010-04-14 LAB — CBC WITH DIFFERENTIAL/PLATELET
BASO%: 0.2 % (ref 0.0–2.0)
Basophils Absolute: 0 10*3/uL (ref 0.0–0.1)
EOS%: 4 % (ref 0.0–7.0)
HGB: 9.3 g/dL — ABNORMAL LOW (ref 11.6–15.9)
MCH: 31 pg (ref 25.1–34.0)
MCHC: 32.9 g/dL (ref 31.5–36.0)
MCV: 94 fL (ref 79.5–101.0)
MONO%: 9.5 % (ref 0.0–14.0)
RBC: 3.01 10*6/uL — ABNORMAL LOW (ref 3.70–5.45)
RDW: 19.1 % — ABNORMAL HIGH (ref 11.2–14.5)
lymph#: 1 10*3/uL (ref 0.9–3.3)

## 2010-04-28 LAB — CBC WITH DIFFERENTIAL/PLATELET
Basophils Absolute: 0 10*3/uL (ref 0.0–0.1)
Eosinophils Absolute: 0.2 10*3/uL (ref 0.0–0.5)
HCT: 30 % — ABNORMAL LOW (ref 34.8–46.6)
HGB: 9.7 g/dL — ABNORMAL LOW (ref 11.6–15.9)
LYMPH%: 12.3 % — ABNORMAL LOW (ref 14.0–49.7)
MCV: 93.8 fL (ref 79.5–101.0)
MONO#: 0.7 10*3/uL (ref 0.1–0.9)
MONO%: 8.7 % (ref 0.0–14.0)
NEUT#: 6.4 10*3/uL (ref 1.5–6.5)
Platelets: 143 10*3/uL — ABNORMAL LOW (ref 145–400)

## 2010-05-07 ENCOUNTER — Ambulatory Visit: Payer: Self-pay | Admitting: Oncology

## 2010-05-26 ENCOUNTER — Ambulatory Visit: Payer: Self-pay | Admitting: Oncology

## 2010-05-26 ENCOUNTER — Encounter: Payer: Self-pay | Admitting: Cardiology

## 2010-05-26 LAB — CBC WITH DIFFERENTIAL/PLATELET
BASO%: 0.4 % (ref 0.0–2.0)
Basophils Absolute: 0 10*3/uL (ref 0.0–0.1)
EOS%: 3.6 % (ref 0.0–7.0)
Eosinophils Absolute: 0.3 10*3/uL (ref 0.0–0.5)
HCT: 29.2 % — ABNORMAL LOW (ref 34.8–46.6)
HGB: 9.3 g/dL — ABNORMAL LOW (ref 11.6–15.9)
LYMPH%: 17.7 % (ref 14.0–49.7)
MCH: 29.5 pg (ref 25.1–34.0)
MCHC: 31.7 g/dL (ref 31.5–36.0)
MCV: 93.1 fL (ref 79.5–101.0)
MONO#: 1 10*3/uL — ABNORMAL HIGH (ref 0.1–0.9)
MONO%: 11.3 % (ref 0.0–14.0)
NEUT#: 5.7 10*3/uL (ref 1.5–6.5)
NEUT%: 67 % (ref 38.4–76.8)
Platelets: 150 10*3/uL (ref 145–400)
RBC: 3.14 10*6/uL — ABNORMAL LOW (ref 3.70–5.45)
RDW: 19.2 % — ABNORMAL HIGH (ref 11.2–14.5)
WBC: 8.5 10*3/uL (ref 3.9–10.3)
lymph#: 1.5 10*3/uL (ref 0.9–3.3)

## 2010-05-26 LAB — COMPREHENSIVE METABOLIC PANEL
ALT: 15 U/L (ref 0–35)
AST: 21 U/L (ref 0–37)
Albumin: 4.5 g/dL (ref 3.5–5.2)
Alkaline Phosphatase: 29 U/L — ABNORMAL LOW (ref 39–117)
BUN: 19 mg/dL (ref 6–23)
CO2: 28 mEq/L (ref 19–32)
Calcium: 9.5 mg/dL (ref 8.4–10.5)
Chloride: 103 mEq/L (ref 96–112)
Creatinine, Ser: 1.29 mg/dL — ABNORMAL HIGH (ref 0.40–1.20)
Glucose, Bld: 100 mg/dL — ABNORMAL HIGH (ref 70–99)
Potassium: 4 mEq/L (ref 3.5–5.3)
Sodium: 142 mEq/L (ref 135–145)
Total Bilirubin: 0.5 mg/dL (ref 0.3–1.2)
Total Protein: 7 g/dL (ref 6.0–8.3)

## 2010-05-26 LAB — LACTATE DEHYDROGENASE: LDH: 220 U/L (ref 94–250)

## 2010-06-07 ENCOUNTER — Encounter: Payer: Self-pay | Admitting: Cardiology

## 2010-06-07 ENCOUNTER — Encounter: Payer: Self-pay | Admitting: Family Medicine

## 2010-06-16 LAB — CBC WITH DIFFERENTIAL/PLATELET
BASO%: 0.3 % (ref 0.0–2.0)
EOS%: 4.9 % (ref 0.0–7.0)
HCT: 29.9 % — ABNORMAL LOW (ref 34.8–46.6)
MCH: 29.8 pg (ref 25.1–34.0)
MCHC: 31.9 g/dL (ref 31.5–36.0)
MONO#: 0.8 10*3/uL (ref 0.1–0.9)
NEUT%: 69.6 % (ref 38.4–76.8)
RBC: 3.2 10*6/uL — ABNORMAL LOW (ref 3.70–5.45)
WBC: 8 10*3/uL (ref 3.9–10.3)
lymph#: 1.3 10*3/uL (ref 0.9–3.3)

## 2010-06-18 NOTE — Progress Notes (Signed)
  Called and spoke w/ [facility], she faxed over records to me.Gave to Jodi Marble Mesiemore  December 29, 2009 11:46 AM\    Appended Document:  Records not give to [place].Given to [employer]

## 2010-06-18 NOTE — Cardiovascular Report (Signed)
Summary: Office Visit   Office Visit   Imported By: Roderic Ovens 01/06/2010 16:11:24  _____________________________________________________________________  External Attachment:    Type:   Image     Comment:   External Document

## 2010-06-18 NOTE — Letter (Signed)
Summary: labs dr Laurence Spates 09-16-09  labs dr Laurence Spates 09-16-09   Imported By: Faythe Ghee 09/26/2009 09:47:11  _____________________________________________________________________  External Attachment:    Type:   Image     Comment:   External Document

## 2010-06-18 NOTE — Assessment & Plan Note (Signed)
Summary: ROV PER DR.ALLRED/D.MILLER   Primary Provider:  Oval Linsey MD  CC:  pt feeling better. Pt states she is still having puffiness and swelling at site.  She also states she has finished antibiotic for MRSA and that the dermatology center was supposed to fax some documentation about her MRSA.  History of Present Illness: [name] is seen in followup for pacemaker implantation for symptomatic bradycardia with high-grade heart block and post micturition syncope.  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 The site was in the left shoulder proximate to her pacemaker site. 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 It has been healing now. She is previously but no longer on antibiotics. There's been some concern about puffiness of the pacemaker site. She saw Dr. Johney Frame last week to set up this appointment today.  The other concern is that she has been having problems with falling for the last 2 weeks. She finds that she tends to drift to one side and then falls down. She is to stay on the ground for some period of time.       Current Medications (verified): 1)  Plavix 75 Mg Tabs (Clopidogrel Bisulfate) .... Take One Tablet By Mouth Once Daily. 2)  Singulair 10 Mg Tabs (Montelukast Sodium) .... As Needed 3)  Acetaminophen 500 Mg Tabs (Acetaminophen) .... As Needed 4)  Levothroid 112 Mcg Tabs (Levothyroxine Sodium) .... Take One Tablet By Mouth Once Daily. 5)  Aspirin Ec 325 Mg Tbec (Aspirin) .... Take One Tablet By Mouth Daily 6)  Crestor 20 Mg Tabs (Rosuvastatin Calcium) .... Take One Tablet By Mouth Once Daily. 7)  Metformin Hcl 1000 Mg Tabs (Metformin Hcl) .... Take One Tablet By Mouth Twice Daily. 8)  Paroxetine Hcl 10 Mg/60ml Susp (Paroxetine Hcl) .... Take One Tablet By Mouth Once Daily. 9)  Lisinopril-Hydrochlorothiazide 20-12.5 Mg Tabs (Lisinopril-Hydrochlorothiazide) .... Take One Tablet Once Daily  Allergies (verified): 1)  ! Penicillin 2)  !  Sulfa 3)  ! Codeine  Past History:  Past Medical History: Last updated: 10/31/2008 CAD, UNSPECIFIED SITE (ICD-414.00) PVD (ICD-443.9) CAROTID ARTERY STENOSIS, WITHOUT INFARCTION (ICD-433.10) HYPERTENSION, UNSPECIFIED (ICD-401.9) HYPERLIPIDEMIA-MIXED (ICD-272.4) HYPOTHYROIDISM (ICD-244.9) RENAL FAILURE, CHRONIC (ICD-585.9)    Past Surgical History: Last updated: 10/31/2008 Cholecystectomy PCM - medtronic Hyesterctomy  Social History: Last updated: 10/31/2008 Retired  Single  Tobacco Use - No.  Alcohol Use - no Regular Exercise - no Drug Use - no  Vital Signs:  Patient profile:   77 year old female Height:      61 inches Weight:      117 pounds BMI:     22.19 Pulse rate:   74 / minute Pulse (ortho):   86 / minute Pulse rhythm:   regular BP sitting:   130 / 68  (left arm) BP standing:   108 / 71 Cuff size:   large  Vitals Entered By: Judithe Modest CMA (January 05, 2010 11:47 AM)  Serial Vital Signs/Assessments:  Time      Position  BP       Pulse  Resp  Temp     By 12:35 PM  Lying LA  130/72                         Judithe Modest CMA 12:35 PM  Sitting   108/72   80                    Judithe Modest CMA  12:35 PM  Standing  108/71   86                    Judithe Modest CMA  Comments: 12:35 PM 2 minutes-115/73 HR 81 3 minutes-112/72 HR 81  Pt reports no symptoms during.   By: Judithe Modest CMA    Physical Exam  General:  The patient was alert and oriented in no acute distress. HEENT Normal.  Neck veins were flat, carotids were brisk.  Pacemaker site was well-healed . It is without tenderness erythema or puffiness. The other wound is approximately 1-2 cm to its left (i.e. laterally) and seems to be healing. Lungs were clear.  Heart sounds were regular without murmurs or gallops.  Abdomen was soft with active bowel sounds. There is no clubbing cyanosis or edema. Skin Warm and dry neurological exam demonstrates abnol rhomberg with drifitng to the left   finger nose normal     PPM Specifications Following MD:  Sherryl Manges, MD     PPM Vendor:  Medtronic     PPM Model Number:  [id number]     PPM Serial Number:  [id number] PPM DOI:  10/25/2007      Lead 1    Location: RA     DOI: 10/25/2007     Model #: 9147     Serial #: [id number]     Status: active Lead 2    Location: RV     DOI: 10/25/2007     Model #: 6578     Serial #: [id number]     Status: active   Indications:  BRADY   PPM Follow Up Pacer Dependent:  Yes      Episodes Coumadin:  No  Parameters Mode:  DDDR     Lower Rate Limit:  60     Upper Rate Limit:  130 Paced AV Delay:  150     Sensed AV Delay:  120  Impression & Recommendations:  Problem # 1:  HYPOTENSION, ORTHOSTATIC (ICD-458.0) NOt demonstable to day, bytut history consistent and tehn almost passed out with abrupt standing.  will discontinue her Lisisonpril HCT  She will need followup with her PCP re BP  Problem # 2:  PACEMAKER, PERMANENT DDD- MDT (ICD-V45.01) wound looks ok, no evidne ce of infection of device pocket   Problem # 3:  CAD, PRIOR STENTING  NORMAL LV FUNCTION (ICD-414.00)  wothoug symptpoms Her updated medication list for this problem includes:    Plavix 75 Mg Tabs (Clopidogrel bisulfate) .Marland Kitchen... Take one tablet by mouth once daily.    Aspirin Ec 325 Mg Tbec (Aspirin) .Marland Kitchen... Take one tablet by mouth daily    Lisinopril-hydrochlorothiazide 20-12.5 Mg Tabs (Lisinopril-hydrochlorothiazide) .Marland Kitchen... Take one tablet once daily  Her updated medication list for this problem includes:    Plavix 75 Mg Tabs (Clopidogrel bisulfate) .Marland Kitchen... Take one tablet by mouth once daily.    Aspirin Ec 325 Mg Tbec (Aspirin) .Marland Kitchen... Take one tablet by mouth daily    Lisinopril-hydrochlorothiazide 20-12.5 Mg Tabs (Lisinopril-hydrochlorothiazide) .Marland Kitchen... Take one tablet once daily  Problem # 4:  SHOULDR&UPPR ARM ABRASION/FRICTION BURN INFECTED (ICD-912.1) related to dermatological procedure, proximal to but not involving hte pacer  pocket  Patient Instructions: 1)  Your physician recommends that you schedule a follow-up appointment in: 6 MONTHS KRISTIN AND PAULA 2)  Your physician has recommended you make the following change in your medication:  STOP LISINOPRIL/HCTZ AND CALL IN COUPLE OF WEEKS WITH STATUS IF CONT TO HAVE DIZZINESS

## 2010-06-18 NOTE — Cardiovascular Report (Signed)
Summary: Office Visit   Office Visit   Imported By: Roderic Ovens 11/03/2009 15:25:08  _____________________________________________________________________  External Attachment:    Type:   Image     Comment:   External Document

## 2010-06-18 NOTE — Letter (Signed)
Summary: Internal Correspondence  Internal Correspondence   Imported By: Kassie Mends 10/02/2009 09:26:42  _____________________________________________________________________  External Attachment:    Type:   Image     Comment:   External Document

## 2010-06-18 NOTE — Cardiovascular Report (Signed)
Summary: Office Visit   Office Visit   Imported By: Roderic Ovens 05/22/2009 15:21:26  _____________________________________________________________________  External Attachment:    Type:   Image     Comment:   External Document

## 2010-06-18 NOTE — Letter (Signed)
[facility] 09-[facility]  [facility] 09-16-2009   Imported By: Faythe Ghee 09/30/2009 11:55:52  _____________________________________________________________________  External Attachment:    Type:   Image     Comment:   External Document

## 2010-06-18 NOTE — Letter (Signed)
Summary: [facility]  [facility]   Imported By: Debby Freiberg 11/20/2009 13:28:30  _____________________________________________________________________  External Attachment:    Type:   Image     Comment:   External Document

## 2010-06-18 NOTE — Progress Notes (Signed)
Summary: CALLING ABOUT B/P MEDS  Phone Note Call from Patient Call back at Home Phone [phone]   Caller: Patient Summary of Call: CALLING REGARDING BEING OFF OF B/P MEDICATION LISINOPRIL Initial call taken by: Judie Grieve,  January 21, 2010 11:11 AM  Follow-up for Phone Call        PER DR Graciela Husbands START HCTZ 25 MG 1/2 TAB once daily AND REPEAT BMET IN 1 WEEK  PT AWARE OF ABOVE AND TO ALSO KEEP B/P LOG. Follow-up by: Scherrie Bateman, LPN,  January 21, 2010 4:38 PM    New/Updated Medications: HYDROCHLOROTHIAZIDE 25 MG TABS (HYDROCHLOROTHIAZIDE) 1/2 TAB once daily Prescriptions: HYDROCHLOROTHIAZIDE 25 MG TABS (HYDROCHLOROTHIAZIDE) 1/2 TAB once daily  #30 x 11   Entered by:   Scherrie Bateman, LPN   Authorized by:   Nathen May, MD, [facility]   Signed by:   Scherrie Bateman, LPN on 27/25/3664   Method used:   [facility] to        [facility]  [address] [phone] ([facility]ail[facility]       [address] [place], [place]  [address]       Ph: [phone] or [phone]       Fax: [phone]   RxID:   [id number]

## 2010-06-18 NOTE — Assessment & Plan Note (Signed)
Summary: pacer check.[email or web address].amber   Visit Type:  Follow-up Primary Provider:  Oval Linsey MD   History of Present Illness: [name] is seen in followup for pacemaker implantation for symptomatic bradycardia with high-grade heart block and post micturition syncope. She has had no recurrent syncope.  The patient denies SOB, chest pain, edema or palpitations  that she is worried about her pacemaker site because of thinning at the cephalad aspect.  She struggles with sleeping and sadness related to the 16 year absence since her husband died     Current Medications (verified): 1)  Plavix 75 Mg Tabs (Clopidogrel Bisulfate) .... Take One Tablet By Mouth Once Daily. 2)  Singulair 10 Mg Tabs (Montelukast Sodium) .... As Needed 3)  Acetaminophen 500 Mg Tabs (Acetaminophen) .... As Needed 4)  Levothroid 112 Mcg Tabs (Levothyroxine Sodium) .... Take One Tablet By Mouth Once Daily. 5)  Aspirin Ec 325 Mg Tbec (Aspirin) .... Take One Tablet By Mouth Daily 6)  Crestor 20 Mg Tabs (Rosuvastatin Calcium) .... Take One Tablet By Mouth Once Daily. 7)  Metformin Hcl 1000 Mg Tabs (Metformin Hcl) .... Take One Tablet By Mouth Twice Daily. 8)  Paroxetine Hcl 10 Mg/14ml Susp (Paroxetine Hcl) .... Take One Tablet By Mouth Once Daily.  Allergies (verified): 1)  ! Penicillin 2)  ! Sulfa 3)  ! Codeine  Past History:  Past Medical History: Last updated: 10/31/2008 CAD, UNSPECIFIED SITE (ICD-414.00) PVD (ICD-443.9) CAROTID ARTERY STENOSIS, WITHOUT INFARCTION (ICD-433.10) HYPERTENSION, UNSPECIFIED (ICD-401.9) HYPERLIPIDEMIA-MIXED (ICD-272.4) HYPOTHYROIDISM (ICD-244.9) RENAL FAILURE, CHRONIC (ICD-585.9)    Vital Signs:  Patient profile:   77 year old female Height:      61 inches Weight:      117 pounds BMI:     22.19 Pulse rate:   76 / minute BP sitting:   110 / 70  (left arm)  Vitals Entered By: Laurance Flatten CMA (October 31, 2009 3:23 PM)  Physical Exam  General:  The patient was alert and  oriented in no acute distress. HEENT Normal.  Neck veins were flat, carotids were brisk.  Lungs were clear.  there is atrophy of the subcutaneous fat cephalad to the pacemaker. There is no tethering of the skin over the device Heart sounds were regular without murmurs or gallops.  Abdomen was soft with active bowel sounds. There is no clubbing cyanosis or edema. Skin Warm and dry    PPM Specifications Following MD:  Sherryl Manges, MD     PPM Vendor:  Medtronic     PPM Model Number:  [id number]     PPM Serial Number:  [id number] PPM DOI:  10/25/2007      Lead 1    Location: RA     DOI: 10/25/2007     Model #: 5076     Serial #: [id number]     Status: active Lead 2    Location: RV     DOI: 10/25/2007     Model #: 8295     Serial #: [id number]     Status: active   Indications:  BRADY   PPM Follow Up Battery Voltage:  2.80 V     Battery Est. Longevity:  72YRS     Pacer Dependent:  Yes       PPM Device Measurements Atrium  Amplitude: 0.70 mV, Impedance: 534 ohms, Threshold: 0.750 V at 0.40 msec Right Ventricle  Amplitude: 5.60 mV, Impedance: 462 ohms, Threshold: 0.50 V at 0.40 msec  Episodes MS Episodes:  6  Percent Mode Switch:  <0.1%     Coumadin:  No Ventricular High Rate:  0     Atrial Pacing:  20.6%     Ventricular Pacing:  99.9%  Parameters Mode:  DDDR     Lower Rate Limit:  60     Upper Rate Limit:  130 Paced AV Delay:  150     Sensed AV Delay:  120 Tech Comments:  6 MODE SWITCHES--ALL LESS THAN 1 MINUTE.  NORMAL DEVICE FUNCTION.  CHANGED A AMPLITUDE FROM 1.5 TO 2.00 AND RV AMPLITUDE FROM 2.00 TO 2.5O V.  ROV 6 [facility]. Vella Kohler  October 31, 2009 3:41 PM  Impression & Recommendations:  Problem # 1:  VENTRICULAR TACHYCARDIA (ICD-427.1) no ventricular tachycardia Her updated medication list for this problem includes:    Plavix 75 Mg Tabs (Clopidogrel bisulfate) .Marland Kitchen... Take one tablet by mouth once daily.    Aspirin Ec 325 Mg Tbec (Aspirin) .Marland Kitchen... Take one tablet by  mouth daily  Problem # 2:  SECOND DEGREE AV BLOCK, MOBITZ II (ICD-426.12) she is 100% ventricularly paced Her updated medication list for this problem includes:    Plavix 75 Mg Tabs (Clopidogrel bisulfate) .Marland Kitchen... Take one tablet by mouth once daily.    Aspirin Ec 325 Mg Tbec (Aspirin) .Marland Kitchen... Take one tablet by mouth daily  Problem # 3:  PACEMAKER, PERMANENT DDD- MDT (ICD-V45.01) Device parameters and data were reviewed and no changes were made  as noted there is subcutaneous atrophy but no tethering  Patient Instructions: 1)  Your physician wants you to follow-up in: 6 MONTHS WITH DEVICE CLINIC.  You will receive a reminder letter in the mail two months in advance. If you don't receive a letter, please call our office to schedule the follow-up appointment.

## 2010-06-18 NOTE — Letter (Signed)
Summary: [facility] 06-25-09  [facility] 06-25-09   Imported By: Faythe Ghee 09/12/2009 14:47:46  _____________________________________________________________________  External Attachment:    Type:   Image     Comment:   External Document

## 2010-06-18 NOTE — Progress Notes (Signed)
  Phone Note Call from Patient   Caller: Ky Barban Call For: HCTZ Details for Reason: took whole pill for month rather than 1/2 pill  Follow-up for Phone Call        spoke w/pt and adv w/discuss w/md if BMP needed and BP check needed. She read labs from M/C but addresses WBC only.  Follow-up by: Claris Gladden RN,  February 23, 2010 9:56 AM  Additional Follow-up for Phone Call Additional follow up Details #1::        per Dr. Johney Frame see if can f/u w/PCP. Claris Gladden, RN, BSN 10/10 [phone]    Additional Follow-up for Phone Call Additional follow up Details #2::    adv pt that have ordered HCTZ 12.5 so won't have to cut pill in half.  also she will come in tomorrow for BMET, BP check and also stated that pacemaker feels like sticking out and wants that looked at.  Follow-up by: Claris Gladden RN,  February 23, 2010 12:13 PM  New/Updated Medications: HYDROCHLOROTHIAZIDE 12.5 MG TABS (HYDROCHLOROTHIAZIDE) Take one tablet by mouth daily. Prescriptions: HYDROCHLOROTHIAZIDE 12.5 MG TABS (HYDROCHLOROTHIAZIDE) Take one tablet by mouth daily.  #30 x 11   Entered by:   Claris Gladden RN   Authorized by:   Nathen May, MD, [facility]   Signed by:   Claris Gladden RN on 02/23/2010   Method used:   [facility]        [facility]  [address] [phone]       [address]       [place], [place]  [address]       Ph: [phone] or [phone]       Fax: [phone]   RxID:   [id number]

## 2010-06-18 NOTE — Letter (Signed)
Summary: [facility] 01/20/10  [facility] 01/20/10   Imported By: Faythe Ghee 02/11/2010 15:50:01  _____________________________________________________________________  External Attachment:    Type:   Image     Comment:   External Document

## 2010-06-18 NOTE — Assessment & Plan Note (Signed)
Summary: device check/pacer site is puffy   Allergies: 1)  ! Penicillin 2)  ! Sulfa 3)  ! Codeine   PPM Specifications Following MD:  Sherryl Manges, MD     PPM Vendor:  Medtronic     PPM Model Number:  VEDR01     PPM Serial Number:  [id number] PPM DOI:  10/25/2007      Lead 1    Location: [facility]     DOI: 10/25/2007     Model #: 7846     Serial #: [id number]     Status: active Lead 2    Location: RV     DOI: 10/25/2007     Model #: 2440     Serial #: [id number]     Status: active   Indications:  BRADY   PPM Follow Up Remote Check?  Yes Battery Voltage:  2.80 V     Battery Est. Longevity:  8 yrs     Pacer Dependent:  Yes       PPM Device Measurements Atrium  Amplitude: 1.40 mV, Impedance: 536 ohms, Threshold: 0.50 V at 0.40 msec Right Ventricle  Amplitude: PACED mV, Impedance: 465 ohms, Threshold: 0.750 V at 0.40 msec  Episodes MS Episodes:  7     Percent Mode Switch:  <0.1%     Coumadin:  No Ventricular High Rate:  0     Atrial Pacing:  16.6%     Ventricular Pacing:  99.9%  Parameters Mode:  DDDR     Lower Rate Limit:  60     Upper Rate Limit:  130 Paced AV Delay:  150     Sensed AV Delay:  120 Tech Comments:  PT HAD SURGERY ON SHOULDER X 1 MTH AGO AND THEN DEVELOPED MRSA 2 WKS AFTER SURGERY.  PT HAS BEEN ANTIBIOTICS FOR MRSA.  PT NOTICED LAST WEEK PACER SITE WAS PUFFY.  PT WANTED TO BE CHECKED--PACER SITE A LITTLE PUFFY.  DR Johney Frame ALSO LOOKED AT SITE AND WANTS PT TO KEEP CLOSE CHECK ON IT.  PER JA OV SCHEDULED W/SK FOR NEXT WEEK.  PT SCHEDULED FOR 01-05-10 @ 1130.  NORMAL DEVICE FUNCTION.  NO CHANGES MADE.  Vella Kohler  December 29, 2009 12:25 PM

## 2010-06-18 NOTE — Progress Notes (Signed)
Summary: concern re pacemaker and post surgery site /pt call again  Phone Note Outgoing Call   Caller: Patient [phone] Reason for Call: Talk to Nurse Summary of Call: PT HAD SURGERY ON SHOULDER SAME SIDE AS PACEMAKER(DUE TO CANCER) PT HAD MRSA-SEEMS TO HAVE SOME PROBLEMS WITH THE INCISION SITE AND CONCERNED WITH HER PACEMAKER -PLS ADVISE [phone] OK TO LAEAVE MSG Initial call taken by: Glynda Jaeger,  December 26, 2009 11:32 AM Summary of Call: Spoke w/pt---pt had surgery x 1 mth ago and was diagnosed w/MRSA x 2 wks ago.  Pt has noticed pacer site is puffy and is very uncomfortable.  Pt is worried that something is wrong w/pacer.  Scheduled pt for ov today at 1030.  Vella Kohler  December 29, 2009 9:06 AM  Follow-up for Phone Call        Left message for patient to call us back.  pt returning call pls call [phone] Glynda Jaeger  December 26, 2009 4:26 PM  pt rtn call pls call her back at [phone] pt has problem with pacer Omer Jack  December 29, 2009 8:23 AM  Follow-up by: Altha Harm, LPN,  December 26, 2009 12:37 PM

## 2010-06-24 NOTE — Letter (Signed)
Summary: [facility]   Imported By: Marylou Mccoy 06/15/2010 16:01:56  _____________________________________________________________________  External Attachment:    Type:   Image     Comment:   External Document

## 2010-07-02 ENCOUNTER — Encounter: Payer: Self-pay | Admitting: Internal Medicine

## 2010-07-02 ENCOUNTER — Encounter (INDEPENDENT_AMBULATORY_CARE_PROVIDER_SITE_OTHER): Payer: Medicare Other

## 2010-07-02 DIAGNOSIS — I495 Sick sinus syndrome: Secondary | ICD-10-CM

## 2010-07-07 ENCOUNTER — Encounter (HOSPITAL_BASED_OUTPATIENT_CLINIC_OR_DEPARTMENT_OTHER): Payer: Medicare Other | Admitting: Oncology

## 2010-07-07 ENCOUNTER — Other Ambulatory Visit: Payer: Self-pay | Admitting: Oncology

## 2010-07-07 DIAGNOSIS — D649 Anemia, unspecified: Secondary | ICD-10-CM

## 2010-07-07 LAB — CBC WITH DIFFERENTIAL/PLATELET
Basophils Absolute: 0 10*3/uL (ref 0.0–0.1)
Eosinophils Absolute: 0.4 10*3/uL (ref 0.0–0.5)
HCT: 31.2 % — ABNORMAL LOW (ref 34.8–46.6)
HGB: 9.9 g/dL — ABNORMAL LOW (ref 11.6–15.9)
MCV: 93.7 fL (ref 79.5–101.0)
MONO%: 6.6 % (ref 0.0–14.0)
NEUT#: 7.5 10*3/uL — ABNORMAL HIGH (ref 1.5–6.5)
NEUT%: 74 % (ref 38.4–76.8)
RDW: 19.1 % — ABNORMAL HIGH (ref 11.2–14.5)

## 2010-07-14 NOTE — Procedures (Signed)
Summary: pc2/lg   Current Medications (verified): 1)  Singulair 10 Mg Tabs (Montelukast Sodium) .... As Needed 2)  Acetaminophen 500 Mg Tabs (Acetaminophen) .... As Needed 3)  Levothroid 112 Mcg Tabs (Levothyroxine Sodium) .... Take One Tablet By Mouth Once Daily. 4)  Aspirin Ec 325 Mg Tbec (Aspirin) .... Take One Tablet By Mouth Daily 5)  Crestor 20 Mg Tabs (Rosuvastatin Calcium) .... Take One Tablet By Mouth Once Daily. 6)  Metformin Hcl 1000 Mg Tabs (Metformin Hcl) .... Take One Tablet By Mouth Twice Daily. 7)  Paroxetine Hcl 10 Mg/34ml Susp (Paroxetine Hcl) .... Take One Tablet By Mouth Once Daily. 8)  Hydrochlorothiazide 12.5 Mg Tabs (Hydrochlorothiazide) .... Take One Tablet By Mouth Daily. 9)  Dulera 200-5 Mcg/act Aero (Mometasone Furo-Formoterol Fum) .... Use As Needed  Allergies (verified): 1)  ! Penicillin 2)  ! Sulfa 3)  ! Codeine  PPM Specifications Following MD:  Sherryl Manges, MD     PPM Vendor:  Medtronic     PPM Model Number:  [id number]     PPM Serial Number:  [id number] PPM DOI:  10/25/2007      Lead 1    Location: RA     DOI: 10/25/2007     Model #: 4098     Serial #: [id number]     Status: active Lead 2    Location: RV     DOI: 10/25/2007     Model #: 6213     Serial #: [id number]     Status: active   Indications:  BRADY   PPM Follow Up Pacer Dependent:  Yes      Episodes Coumadin:  No  Parameters Mode:  DDDR     Lower Rate Limit:  60     Upper Rate Limit:  130 Paced AV Delay:  150     Sensed AV Delay:  120 Tech Comments:  See PaceArt report. Vella Kohler  July 02, 2010 9:54 AM

## 2010-07-23 NOTE — Cardiovascular Report (Signed)
Summary: Office Visit   Office Visit   Imported By: Roderic Ovens 07/16/2010 16:14:12  _____________________________________________________________________  External Attachment:    Type:   Image     Comment:   External Document

## 2010-07-28 ENCOUNTER — Other Ambulatory Visit: Payer: Self-pay | Admitting: Oncology

## 2010-07-28 ENCOUNTER — Encounter (HOSPITAL_BASED_OUTPATIENT_CLINIC_OR_DEPARTMENT_OTHER): Payer: Medicare Other | Admitting: Oncology

## 2010-07-28 DIAGNOSIS — D649 Anemia, unspecified: Secondary | ICD-10-CM

## 2010-07-28 LAB — CBC WITH DIFFERENTIAL/PLATELET
Basophils Absolute: 0 10*3/uL (ref 0.0–0.1)
Eosinophils Absolute: 0.4 10*3/uL (ref 0.0–0.5)
HGB: 9.3 g/dL — ABNORMAL LOW (ref 11.6–15.9)
MCV: 94.4 fL (ref 79.5–101.0)
MONO%: 10 % (ref 0.0–14.0)
NEUT#: 5.5 10*3/uL (ref 1.5–6.5)
RBC: 3.21 10*6/uL — ABNORMAL LOW (ref 3.70–5.45)
RDW: 17.3 % — ABNORMAL HIGH (ref 11.2–14.5)
WBC: 8 10*3/uL (ref 3.9–10.3)
lymph#: 1.2 10*3/uL (ref 0.9–3.3)

## 2010-08-18 ENCOUNTER — Encounter (HOSPITAL_BASED_OUTPATIENT_CLINIC_OR_DEPARTMENT_OTHER): Payer: Medicare Other | Admitting: Oncology

## 2010-08-18 ENCOUNTER — Other Ambulatory Visit: Payer: Self-pay | Admitting: Oncology

## 2010-08-18 DIAGNOSIS — D649 Anemia, unspecified: Secondary | ICD-10-CM

## 2010-08-18 LAB — CBC WITH DIFFERENTIAL/PLATELET
Basophils Absolute: 0 10*3/uL (ref 0.0–0.1)
Eosinophils Absolute: 0.7 10*3/uL — ABNORMAL HIGH (ref 0.0–0.5)
HGB: 9.2 g/dL — ABNORMAL LOW (ref 11.6–15.9)
LYMPH%: 16.1 % (ref 14.0–49.7)
MONO#: 0.6 10*3/uL (ref 0.1–0.9)
NEUT#: 5.5 10*3/uL (ref 1.5–6.5)
Platelets: 134 10*3/uL — ABNORMAL LOW (ref 145–400)
RBC: 3.04 10*6/uL — ABNORMAL LOW (ref 3.70–5.45)
RDW: 18.9 % — ABNORMAL HIGH (ref 11.2–14.5)
WBC: 8.2 10*3/uL (ref 3.9–10.3)

## 2010-08-23 LAB — CBC
HCT: 34.3 % — ABNORMAL LOW (ref 36.0–46.0)
Hemoglobin: 11.4 g/dL — ABNORMAL LOW (ref 12.0–15.0)
MCHC: 33.2 g/dL (ref 30.0–36.0)
MCV: 101.5 fL — ABNORMAL HIGH (ref 78.0–100.0)
RBC: 3.38 MIL/uL — ABNORMAL LOW (ref 3.87–5.11)
RDW: 14.2 % (ref 11.5–15.5)

## 2010-08-23 LAB — DIFFERENTIAL
Basophils Absolute: 0 10*3/uL (ref 0.0–0.1)
Basophils Relative: 0 % (ref 0–1)
Eosinophils Relative: 3 % (ref 0–5)
Monocytes Absolute: 1 10*3/uL (ref 0.1–1.0)
Monocytes Relative: 10 % (ref 3–12)
Neutro Abs: 7.5 10*3/uL (ref 1.7–7.7)

## 2010-08-23 LAB — BASIC METABOLIC PANEL
CO2: 30 mEq/L (ref 19–32)
Chloride: 100 mEq/L (ref 96–112)
GFR calc Af Amer: >60 mL/min (ref >60)
Glucose, Bld: 121 mg/dL — ABNORMAL HIGH (ref 70–99)
Sodium: 143 mEq/L (ref 135–145)

## 2010-08-23 LAB — POCT CARDIAC MARKERS: CKMB, poc: <1 ng/mL — ABNORMAL LOW (ref 1.0–8.0)

## 2010-08-24 ENCOUNTER — Encounter (HOSPITAL_BASED_OUTPATIENT_CLINIC_OR_DEPARTMENT_OTHER): Payer: Medicare Other | Admitting: Oncology

## 2010-08-24 ENCOUNTER — Other Ambulatory Visit: Payer: Self-pay | Admitting: Oncology

## 2010-08-24 DIAGNOSIS — E039 Hypothyroidism, unspecified: Secondary | ICD-10-CM

## 2010-08-24 DIAGNOSIS — D649 Anemia, unspecified: Secondary | ICD-10-CM

## 2010-08-24 DIAGNOSIS — D696 Thrombocytopenia, unspecified: Secondary | ICD-10-CM

## 2010-08-24 LAB — CBC WITH DIFFERENTIAL/PLATELET
Basophils Absolute: 0 10*3/uL (ref 0.0–0.1)
Eosinophils Absolute: 0.7 10*3/uL — ABNORMAL HIGH (ref 0.0–0.5)
HCT: 32.6 % — ABNORMAL LOW (ref 34.8–46.6)
HGB: 10.4 g/dL — ABNORMAL LOW (ref 11.6–15.9)
MCH: 30 pg (ref 25.1–34.0)
MCV: 93.4 fL (ref 79.5–101.0)
MONO%: 9 % (ref 0.0–14.0)
NEUT#: 5.5 10*3/uL (ref 1.5–6.5)
NEUT%: 65.3 % (ref 38.4–76.8)
RDW: 18.8 % — ABNORMAL HIGH (ref 11.2–14.5)
lymph#: 1.5 10*3/uL (ref 0.9–3.3)

## 2010-09-08 ENCOUNTER — Encounter (HOSPITAL_BASED_OUTPATIENT_CLINIC_OR_DEPARTMENT_OTHER): Payer: Medicare Other | Admitting: Oncology

## 2010-09-08 ENCOUNTER — Other Ambulatory Visit: Payer: Self-pay | Admitting: Oncology

## 2010-09-08 DIAGNOSIS — D649 Anemia, unspecified: Secondary | ICD-10-CM

## 2010-09-08 LAB — CBC WITH DIFFERENTIAL/PLATELET
Eosinophils Absolute: 0.6 10*3/uL — ABNORMAL HIGH (ref 0.0–0.5)
HCT: 30.3 % — ABNORMAL LOW (ref 34.8–46.6)
LYMPH%: 14.6 % (ref 14.0–49.7)
MCV: 93.8 fL (ref 79.5–101.0)
MONO#: 0.7 10*3/uL (ref 0.1–0.9)
MONO%: 7.4 % (ref 0.0–14.0)
NEUT#: 6.6 10*3/uL — ABNORMAL HIGH (ref 1.5–6.5)
NEUT%: 71.1 % (ref 38.4–76.8)
Platelets: 137 10*3/uL — ABNORMAL LOW (ref 145–400)
WBC: 9.3 10*3/uL (ref 3.9–10.3)

## 2010-09-21 ENCOUNTER — Encounter (HOSPITAL_BASED_OUTPATIENT_CLINIC_OR_DEPARTMENT_OTHER): Payer: Medicare Other | Admitting: Oncology

## 2010-09-21 ENCOUNTER — Other Ambulatory Visit: Payer: Self-pay | Admitting: Oncology

## 2010-09-21 DIAGNOSIS — D649 Anemia, unspecified: Secondary | ICD-10-CM

## 2010-09-21 LAB — CBC & DIFF AND RETIC
BASO%: 0.2 % (ref 0.0–2.0)
EOS%: 5.3 % (ref 0.0–7.0)
HCT: 30.4 % — ABNORMAL LOW (ref 34.8–46.6)
LYMPH%: 15.8 % (ref 14.0–49.7)
MCH: 29.1 pg (ref 25.1–34.0)
MCHC: 30.9 g/dL — ABNORMAL LOW (ref 31.5–36.0)
MCV: 94.1 fL (ref 79.5–101.0)
MONO#: 0.9 10*3/uL (ref 0.1–0.9)
MONO%: 9.6 % (ref 0.0–14.0)
NEUT%: 69.1 % (ref 38.4–76.8)
Platelets: 126 10*3/uL — ABNORMAL LOW (ref 145–400)

## 2010-09-21 LAB — COMPREHENSIVE METABOLIC PANEL
ALT: 13 U/L (ref 0–35)
AST: 19 U/L (ref 0–37)
Calcium: 10 mg/dL (ref 8.4–10.5)
Chloride: 100 mEq/L (ref 96–112)
Creatinine, Ser: 1.47 mg/dL — ABNORMAL HIGH (ref 0.40–1.20)
Sodium: 141 mEq/L (ref 135–145)

## 2010-09-21 LAB — MORPHOLOGY: PLT EST: DECREASED

## 2010-09-29 ENCOUNTER — Other Ambulatory Visit: Payer: Self-pay | Admitting: Oncology

## 2010-09-29 ENCOUNTER — Encounter (HOSPITAL_BASED_OUTPATIENT_CLINIC_OR_DEPARTMENT_OTHER): Payer: Medicare Other | Admitting: Oncology

## 2010-09-29 DIAGNOSIS — E039 Hypothyroidism, unspecified: Secondary | ICD-10-CM

## 2010-09-29 DIAGNOSIS — D696 Thrombocytopenia, unspecified: Secondary | ICD-10-CM

## 2010-09-29 DIAGNOSIS — D649 Anemia, unspecified: Secondary | ICD-10-CM

## 2010-09-29 LAB — CBC WITH DIFFERENTIAL/PLATELET
Basophils Absolute: 0 10*3/uL (ref 0.0–0.1)
EOS%: 3.8 % (ref 0.0–7.0)
HCT: 30.6 % — ABNORMAL LOW (ref 34.8–46.6)
HGB: 9.4 g/dL — ABNORMAL LOW (ref 11.6–15.9)
MCH: 28.7 pg (ref 25.1–34.0)
MCV: 93.6 fL (ref 79.5–101.0)
MONO%: 9.5 % (ref 0.0–14.0)
NEUT%: 71 % (ref 38.4–76.8)
RDW: 16.9 % — ABNORMAL HIGH (ref 11.2–14.5)

## 2010-09-29 NOTE — Assessment & Plan Note (Signed)
[facility]ALTHCARE                       [facility] CARDIOLOGY OFFICE NOTE   VALLERI, HENDRICKSEN                        MRN:          [id number]  DATE:11/08/2007                            DOB:          May 21, 1923    CARDIOLOGIST:  Gerrit Friends. Dietrich Pates, MD, [facility]  Duke Salvia, MD, [facility].   PRIMARY CARE PHYSICIAN:  Isabella Stalling, MD   REASON FOR VISIT:  Post-hospitalization followup.   HISTORY OF PRESENT ILLNESS:  [name] is an 77 year old female patient  who was recently admitted with unstable angina and near syncope  associated with profound first-degree AV block as well as transient  second-degree heart block.  She was initially seen in [facility] and transferred to [facility].  She underwent cardiac  catheterization that revealed normal LV function and 95% ostial first  diagonal stenosis and a 70% proximal right posterolateral branch  stenosis.  She was treated with a TAXUS drug-eluting stent to the  diagonal lesion  by Dr. Juanda Chance.  Medical therapy was recommended for the  right posterolateral branch stenosis.  She eventually underwent  Medtronic Dual-chamber pacemaker implantation by Dr. Graciela Husbands secondary to  her asymptomatic bradycardia and second-degree heart block.  The patient  had a significant amount of nausea and was eventually seen by  gastroenterology while she was admitted.  She was found to have gastric  erosions on her EGD and was placed on twice daily dosing of proton pump  inhibitor therapy.  She was also noted to be significantly anemic.  She  was seen by Dr. Cyndie Chime of Hematology who ordered several studies,  which essentially came back negative.  The patient probably is going to  undergo a bone marrow biopsy and sees Dr. Cyndie Chime on Monday of next  week.  She saw her primary care physician this morning who noted that  she is probably diabetic and has ordered some followup blood work.  Her  last set of  labs from discharge from [facility] revealed  potassium of 4.7, creatinine of 1.12, hemoglobin of 9.3, MCV of  103.5,  platelet count 93,000.   The patient notes that she continues to be weak.  She denies any  recurrence of her angina.  She does have some soreness around her  pacemaker.  She denies any fevers, chills, or purulent drainage.  She  denies any orthopnea, PND, or pedal edema.  Denies any syncope or near  syncope.   CURRENT MEDICATIONS:  1. Tessalon Perles as directed.  2. Synthroid 112 mcg daily.  3. Crestor 20 mg daily.  4. Aspirin 325 mg daily.  5. Benicar 20 mg daily.  6. Singulair p.r.n.  7. Xanax 0.25 mg half tablet nightly.  8. Plavix 75 mg daily.  9. Pepcid  20 mg b.i.d.   PHYSICAL EXAMINATION:  GENERAL:  She is a well-nourished, well-developed  female, in no acute distress.  VITAL SIGNS:  Blood pressure is 106/70, pulse 60, and weight 127 pounds.  HEENT:  Normal.  NECK:  Without JVD.  CARDIAC:  Normal S1 and S2.  Regular rate and rhythm.  LUNGS:  Clear to auscultation bilaterally.  ABDOMEN:  Soft and nontender.  EXTREMITIES:  Without edema.  NEUROLOGIC:  She is alert and oriented x3.  Cranial nerves II through  XII are grossly intact.  BACK:  She has some mild femoral arteriotomy site with a hematoma or  bruits.   IMPRESSION:  1. Coronary artery disease status post TAXUS drug-eluting stent      placement to the first diagonal, October 23, 2007.      a.     Residual posterolateral branch stenosis treated medically.  2. Good left ventricular function.  3. Symptomatic bradycardia status post Medtronic Dual-chamber      pacemaker implantation.  4. Anemia.      a.     Rule out myelodysplastic syndrome - followed by Dr.       Cyndie Chime.  5. Gastroesophageal reflux disease with evidence of gastric erosions      by recent esophagogastroduodenoscopy - evaluated by      Gastroenterology during her recent hospitalization.  6. Carotid stenosis with 50%  right internal carotid artery stenosis      and a 7-mm left internal carotid artery aneurysm - on Plavix      therapy.  7. Hypertension.  8. Dyslipidemia followed by Dr. Janna Arch.  9. History of asthmatic bronchitis.  10.Treated hypothyroidism.  11.History of bladder cancer diagnosed July 2003 status post      chemotherapy.  12.Deconditioning.   PLAN:  1. Ms. Bonds returns to the office today for followup.  Overall, she      is doing well from a cardiovascular standpoint.  She continues to      be weak.  This may in part be related to her anemia.  I suspect she      is also deconditioned.  2. She sees Dr. Cyndie Chime on Monday.  He will followup on her      anemia.  Therefore, we will not draw a complete blood count today.  3. It appears as though she  has diabetes mellitus based upon her      recent lab work detected at Dr. Otilio Saber office and her      hemoglobin A1C done when she was recently hospitalized was 7.      Further recommendations will be per her primary care physician.  4. She has a pacemaker clinic followup next Monday.  She has been      reminded of this appointment.  5. We will facilitate referral to cardiac rehab for her.  I think      continuing increase in activity may help.  6. Her medications appear to be adequate and her blood pressure is      well controlled.  She will continue on aspirin and Plavix.  She has      been educated on the importance of Plavix therapy with a drug-      eluting stent.  7. The patient will followup with Dr. Dietrich Pates in the next 3 months      for routine followup or sooner p.r.n.      Tereso Newcomer, PA-C  Electronically Signed      Gerrit Friends. Dietrich Pates, MD, [facility]  Electronically Signed   SW/MedQ  DD: 11/08/2007  DT: 11/09/2007  Job #: [id number]   cc:   Melvyn Novas, MD

## 2010-09-29 NOTE — H&P (Signed)
[name] NO.:  [id number]   MEDICAL RECORD NO.:  [id number]          PATIENT TYPE:  INP   LOCATION:  2919                         FACILITY:  [facility]   PHYSICIAN:  [name] Sans. Wall, MD, FACCDATE OF BIRTH:  01/20/1924   DATE OF ADMISSION:  10/23/2007  DATE OF DISCHARGE:                              HISTORY & PHYSICAL   PRIMARY CARE [name]:  [name] Harness, MD.   PRIMARY CARDIOLOGIST:  [name].   PATIENT PROFILE:  An 77 year old Caucasian female without prior cardiac  history who presents with a 6-day history of substernal chest pressure  and presyncope in the setting of bradycardia and second-degree type 2  heart block.  Problems:  1. Symptomatic bradycardia with first-degree heart block and secondary      type 2 heart block.  2. Hypertension.  3. Hyperlipidemia.  4. PVD/carotid disease.      a.     August 18, 2007, MRA 50% right internal carotid artery       stenosis, focal 7-mm left internal carotid artery aneurysm.  5. Hypothyroidism.  6. Status post hysterectomy, approximately 50 years ago.  7. Status post cholecystectomy and appendectomy.  8. Bladder cancer, previously followed by Dr. Etta [name] with chemotherapy      last performed 4 years ago.  9. Seasonal allergies.   HISTORY OF PRESENT ILLNESS:  An 77 year old Caucasian female without  prior cardiac history.  She was in her usual state of health until this  past Wednesday, October 18, 2007 at which she began to experience  orthostasis presyncope.  By October 19, 2007, she noticed irregularity to  her heart rhythm with intermittent rest, exertional substernal chest  pressure, and heaviness associated with dyspnea and nausea, lasting  approximately 5-10 minutes, resolving spontaneously.  Symptoms persisted  all weekend long on intermittent basis and today she saw her primary  care [name] who sent her along to [facility] where she was  found to be bradycardic with first-degree  heart block as well as  secondary type 2 heart block with variable block.  She was then sent on  to [facility].  Currently, she is in step-down and continues to complain  of mild chest pressure with dizziness and lightheadedness even with just  sitting up.  Heart rate is in the 40s.   ALLERGIES:  1. CODEINE.  2. SULFA.  3. PENICILLIN.   HOME MEDICATIONS:  1. Synthroid 112 mcg daily.  2. Plavix 75 mg daily.  3. Aspirin 81 mg daily.  4. Benicar HCT 40/12.5 mg daily.  5. Crestor 20 mg nightly.  6. Singular 10 mg p.r.n.   FAMILY HISTORY:  Mother died of pneumonia following a hip fracture at  age 24.  Father died of old age at 79.  She has a brother died of  cancer.   SOCIAL HISTORY:  She lives in [place] by herself.  She is retired.  She denies tobacco, alcohol, or drug use.  Has never done any exercise.   REVIEW OF SYSTEMS:  Positive for vertiginous-like symptoms with  lightheadedness, dizziness,  and presyncope since last Wednesday.  She  has had chest pressure and heaviness associated with dyspnea on exertion  as well as dyspnea at rest as well as nausea since last Thursday.  Otherwise, all systems were reviewed and negative.   PHYSICAL EXAMINATION:  VITAL SIGNS:  She is afebrile.  Heart rate 44,  respirations 20, blood pressure 104/47, and pulse ox 98% on 2 L.  GENERAL:  Pleasant white female in no acute distress.  Awake, alert, and  oriented x3.  HEENT:  Normal.  Nares grossly intact, nonfocal.  SKIN:  Warm and dry without lesions or masses.  NECK:  She has bilateral soft carotid bruits.  No JVD.  LUNGS:  Respirations regular, unlabored. Clear to auscultation.  CARDIAC:  Irregular, distant S1 and S2.  I do not appreciate any  murmurs.  ABDOMEN:  Round, soft, nontender, and nondistended.  Bowel sounds  present.  LOWER EXTREMITIES:  Warm, dry, and pink.  No clubbing, cyanosis, or  edema.  Dorsalis pedis +2, tibial pulses 2+ ankle  bilaterally.  No  femoral bruits  noted.   Chest x-ray is pending.  EKG shows sinus rhythm with a first-degree AV  block as well as secondary type 2 heart block at a rate of 44 beats per  minute.  There is a normal axis and right bundle-branch block.   LAB WORK:  CK 56, MB 1.8, and troponin I 0.01.   ASSESSMENT AND PLAN:  1. Unstable angina/symptomatic bradycardia/high-grade heart block      including second-degree type 2:  She has had symptoms since October 18, 2007.  She still complains of chest heaviness currently.  She did      receive Lovenox at 3 p.m. and is currently on IV nitroglycerin.  We      [name] titrate this for chest pain and continue cycle of cardiac      markers.  As she is continuing to have symptoms, we [name] plan an      urgent catheterization this afternoon.  With regards to her      bradycardia, we [name] first rule out ischemia and also check her      blood works to rule out electrolyte abnormalities which did not      appear had been done yet.  If we cannot find an ischemic or      metabolic cause of her bradycardia, she [name] likely require      permanent pacemaker possibly tomorrow or sooner if necessary.      Continue aspirin and statin therapy.  No beta-blocker in the      setting of bradycardia.  2. Hypertension, currently stable.  3. Hyperlipidemia.  Continue lipid therapy.  We [name] continue statin      therapy.  Check lipids and LFTs.  4. Known peripheral vascular disease.  She was recently placed on      Plavix secondary to internal carotid artery      stenosis.  Continue aspirin, statin, and Plavix.  5. History of bladder cancer.  She last underwent chemotherapy about 4      years ago.  She has not seen Dr. Etta [name] in some time.  She has had      no issues since her last chemotherapy.      [name], ANP      [name] Sans. Daleen Squibb, MD, [facility]  Electronically Signed    CB/MEDQ  D:  10/23/2007  T:  10/24/2007  Job:  [id number]

## 2010-09-29 NOTE — H&P (Signed)
[name], FAILLA NO.:  [id number]   MEDICAL RECORD NO.:  [id number]         PATIENT TYPE:  PINP   LOCATION:  IC07                          FACILITY:  [facility]   PHYSICIAN:  Melvyn Novas, MDDATE OF BIRTH:  January 22, 1924   DATE OF ADMISSION:  10/23/2007  DATE OF DISCHARGE:  06/08/2009LH                              HISTORY & PHYSICAL   The patient is an 77 year old white female, who presented to the office  and had complaints of retrosternal chest pressure, which was recurrent  over several days.  She was seen in the ER, subsequently sent home.  Now, she presents to the office with having a history of exertional  classic crescendo angina.  The patient was subsequently admitted to ICU  for evaluation of this and to rule out myocardial infarction and  stabilize any potential ischemic mediated symptoms.   PAST MEDICAL HISTORY:  Significant for peripheral arterial disease with  79% right internal carotid artery stenosis, hyperlipidemia, history of  hypertension, chronic renal failure, and hypothyroidism.   PAST SURGICAL HISTORY:  Remarkable for cholecystectomy.   CURRENT MEDICATIONS:  1. Synthroid 112 mcg p.o. daily.  2. Crestor 20 mg per day.  3. Plavix 75 mg daily.  4. Aspirin 81 mg per day.  5. Benicar 40 mg per day.   SOCIAL HISTORY:  She is a nonsmoker.   PHYSICAL EXAMINATION:  VITAL SIGNS:  Blood pressure is 132/86.  Pulse is  72 and regular.  She is afebrile.  Respiratory rate is 18.  EYES:  PERRLA.  Extraocular movements are intact.  Sclerae clear.  Conjunctivae pink.  NECK:  No JVD.  No carotid bruits.  No thyromegaly.  No thyroid bruits.  LUNGS:  Clear to A and P.  No rales, wheezes, or rhonchi.  HEART:  Regular rhythm.  S1, S2 with normal intensity.  No S3, S4,  gallops.  No heaves, thrills, rubs.  ABDOMEN:  Soft and nontender.  Bowel sounds are normoactive.  No  guarding, rebound, masses, or splenomegaly.  EXTREMITIES:  Trace to 1+ pedal  edema.  NEUROLOGIC:  Cranial nerves grossly intact.  The patient moves all 4  extremities.  Plantars are downgoing.   IMPRESSION:  1. Crescendo angina pectoris.  2. Rule out myocardial infarction.  3. Peripheral arterial disease.  4. Hypertension.  5. Hyperlipidemia.  6. Hypothyroidism.   PLAN:  Admit to ICU with Lovenox, aspirin, Plavix, add Lopressor 25 mg  p.o. q.12 h. and intravenous nitroglycerin for stabilization and ruling  out myocardial infarction.      Melvyn Novas, MD  Electronically Signed     RMD/MEDQ  D:  04/05/2008  T:  04/06/2008  Job:  [id number]

## 2010-09-29 NOTE — Consult Note (Signed)
[name], [name] NO.:  [id number]   MEDICAL RECORD NO.:  [id number]          PATIENT TYPE:  INP   LOCATION:  5501                         FACILITY:  [facility]   PHYSICIAN:  Audery Amel, MD    DATE OF BIRTH:  Feb 02, 1924   DATE OF CONSULTATION:  12/10/2007  DATE OF DISCHARGE:                                 CONSULTATION   PRIMARY CARDIOLOGIST:  Gerrit Friends. Dietrich Pates, MD, [facility]   CONSULT REQUESTED BY:  Internal Medicine Teaching Service.   CHIEF COMPLAINT:  Chest pain.   HISTORY OF PRESENT ILLNESS:  [name] is an 77 year old white female  with past medical history notable for coronary artery disease status  post recent PCI who presents with complaints of chest pain after a  traumatic fall.  The patient states that she got up this morning at  approximately 2:00 a.m. to go to the bathroom.  On her way back to bed  from the bathroom, she fell and landed on her right side sustaining  injury to her right neck and right chest wall.  The patient denied any  chest pain, shortness breath, dyspnea on exertion, palpitations, or  presyncopal symptoms prior to falling.  She did however have right-sided  chest discomfort after the fall.  She presented to the emergency  department for evaluation.  On arrival, her EKG revealed atrial pacing  with no evidence of acute injury or ischemia.  Her initial cardiac  biomarkers were negative x1.  The patient had several CT scans one of  which revealed a fractured right sixth rib.  In light of these findings,  the patient was admitted to the Internal Medicine Service for further  monitoring.  Given her recent history of PCI and an elevated creatinine  kinase, we are consulted for further evaluation.   PAST MEDICAL HISTORY:  1. CAD status post PCI to the ostial D1 with a Taxus 2.25 x 8 mm drug-      eluting stent.  2. Symptomatic bradycardia status post dual-chamber pacemaker.  3. Peptic ulcer disease.  4. Anemia.  5.  Hyperlipidemia.  6. Hypertension.  7. Hypothyroidism.  8. History of bladder carcinoma.  9. Status post TAH.  10.Status post cholecystectomy.  11.Status post appendectomy.   ALLERGIES:  CODEINE, PENICILLIN, SULFA.   HOME MEDICATIONS:  1. Synthroid 112 mcg daily.  2. Aspirin 81 mg daily.  3. Plavix 75  mg daily.  4. Protonix 40 mg b.i.d.  5. Crestor 20 mg daily.  6. Benicar 20 mg daily.   SOCIAL HISTORY:  The patient lives in [place] alone.  She is retired.  She denies any tobacco, alcohol or illicit substance.   FAMILY HISTORY:  There is no pertinent family history contributing to  her presentation.   REVIEW OF SYSTEMS:  As per HPI, otherwise a complete review of systems  was obtained and is negative except as documented.   PHYSICAL EXAMINATION:  Blood pressure is 103/55, heart rate is 65, O2  saturations are 93% on 2 liters nasal cannula.  GENERAL:  The patient is alert and x3, no acute  distress, pleasantly  conversant.  HEENT:  Normocephalic, atraumatic.  EOMI, PERRL, nares patent, OMP is  clear without erythema or exudate.  NECK:  Supple, full range of motion, no appreciable JVD.  Carotid  upstrokes are equal and symmetric bilaterally.  No audible bruit.  There  is no palpable thyromegaly or lymphadenopathy.  CARDIOVASCULAR:  Normal S1,S2 with no audible murmurs, rubs or gallops.  Her PMI is nondisplaced in the left mid clavicular line.  The patient  has palpable chest discomfort over the right chest wall.  LUNGS:  Clear to auscultation anteriorly.  SKIN:  No rashes or lesions.  ABDOMEN:  Soft, nontender, nondistended.  Positive bowel sounds and no  hepatosplenomegaly.  GU:  Normal female genitalia.  EXTREMITIES:  No clubbing, cyanosis or edema.  Peripheral pulses are 2+  and symmetric bilaterally.  There is no further evidence of  musculoskeletal trauma.  NEUROLOGIC:  Grossly nonfocal.   EKG :  Dual-chamber pacemaker with the appropriate capture and tracking.    LABORATORY DATA:  White count 9.6, hematocrit 25.8, platelets 113.  Sodium 139, potassium 4.4, chloride 107, CO2 is 26, BUN 25, creatinine  1.2, glucose 142, CK 189, MB 7.5, troponin-I less than 0.01.   IMPRESSION:  1. Traumatic chest pain.  2. Coronary artery disease.  3. Hyperlipidemia.  4. Hypertension.  5. Anemia.  6. Symptomatic bradycardia status post permanent pacemaker.  7. Hypothyroidism.   PLAN:  From a cardiovascular standpoint, [name] is stable.  On review  of her EKG, there is no evidence of acute injury or ischemia.  It  appears that her dual-chamber pacemaker is functioning appropriately.  Her elevated CK is most likely related to her traumatic fall, and is  unlikely to represent an acute coronary syndrome, especially with a  normal troponin.  I recommend continuing aspirin and Plavix therapy.  There is no indication for an antithrombin at this time.  I would  consider restarting her Crestor 20 mg once daily.  Currently her blood  pressure is under excellent control.  However, should she require  additional therapy, a beta blocker or an ACE inhibitor would be  suitable.  The patient's pacemaker was interrogated during this  presentation and reveals appropriate function and no further evaluation  is warranted.   We appreciate the opportunity to participate in the care of your patient  and we will follow along without you throughout her hospital course.  Please feel free to call with any additional questions.      Audery Amel, MD  Electronically Signed     SHG/MEDQ  D:  12/10/2007  T:  12/10/2007  Job:  [id number]

## 2010-09-29 NOTE — Assessment & Plan Note (Signed)
[name]                         ELECTROPHYSIOLOGY OFFICE NOTE   [name], [name]                        MRN:          [id number]  DATE:01/30/2008                            DOB:          09-May-1924    [name] is seen following pacemaker implantation for symptomatic  bradycardia.   She also had an episode recently of post-micturition syncope in the  middle of the night.  She has had problems with orthostatic  lightheadedness and she saw Dr. Cyndie Chime.  We noted that she was  orthostatic with her blood pressure going from the same position of 105  to 85 in the standing position associated with symptoms, so her Benicar  was discontinued.   Other medications include Synthroid, Xanax, Plavix, and famotidine.   On examination today, her blood pressure off antihypertensives were  143/76 with a pulse of 79.  Her weight was 121, which is down about 5  pounds for the last 2 years.  Her lungs were clear.  Her neck veins were  flat.  Her heart sounds were regular.  Extremities had no edema.   Interrogation of Medtronic Versa pulse generator demonstrated that she  had no intrinsic sinus rhythm at 30 beats per minute.  Her impedance was  570, her threshold was 0.75 at 0.4 in the RA and RV was 0.5 at 0.4 with  an impedance of 503 and an amplitude of 11.2.  Battery voltage was 2.80.  There was no significant intercurrent episodes of atrial fibrillation.   IMPRESSION:  1. Sinus bradycardia - symptomatic status post pacemaker implantation      with recent heart rate excursion.  2. Hypertension in the past with orthostatic intolerance and a recent      fall.   We had a lengthy discussion about the hemodynamics of orthostatic  intolerance secondary to systolic hypertension.  At this point, her  blood pressure does not seem to be striking, but it may be that this was  because of sitting in the upright position.  I have recommended that  when she see Dr.  Janna Arch and Dr. Dietrich Pates that she have her blood  pressure recorded in the supine position.  Sometimes, Mestinon in  conjunction with something like midodrine can be used in the patients  who have symptomatic orthostatic intolerance in the setting of  significant systolic hypertension.   I had also advised her to raise the head of her bed about 6 inches to  help with her nocturnal orthostasis.  We will see her again in one  year's time.     Duke Salvia, MD, [facility]  Electronically Signed    SCK/MedQ  DD: 01/30/2008  DT: 01/31/2008  Job #: [id number]   cc:   Gerrit Friends. Dietrich Pates, MD, [facility]  Melvyn Novas, MD  Genene Churn. Cyndie Chime, M.D.

## 2010-09-29 NOTE — Cardiovascular Report (Signed)
[name], [name]                 ACCOUNT NO.:  [id number]   MEDICAL RECORD NO.:  [id number]          PATIENT TYPE:  INP   LOCATION:  3740                         FACILITY:  [facility]   PHYSICIAN:  Everardo Beals. Juanda Chance, MD, FACCDATE OF BIRTH:  [birth year]   DATE OF PROCEDURE:  10/23/2007  DATE OF DISCHARGE:  10/30/2007                            CARDIAC CATHETERIZATION   HISTORY:  [name] is an 77 year old and was admitted with unstable  angina and second-degree AV block.  She underwent diagnostic  catheterization by Dr. Diona Browner and was found to have a severe disease  in diagonal branch of the LAD and also disease in the distal right  coronary artery and posterior descending artery.  After review of her  angiogram, Dr. Diona Browner and I felt that intervention of the diagonal was  the appropriate choice and felt that she probably would need a pacemaker  as well.   PROCEDURE:  The procedure was performed via the right femoral arteries  and arterial sheath and a Q 3.5 6-French guiding catheter with side  holes.  The patient was given Angiomax bolus and infusion and was loaded  with aspirin and Plavix.  We passed a Prowater wire down the diagonal  branch without difficulty.  We predilated with 2.0 x 16 mm Apex balloon  performing one inflation up to 8 atmospheres for 30 seconds.  We then  used a 2.25 x 10 mm cutting balloon performing three inflations up to 8  atmospheres for 30 seconds.  This gave less than optimal results and we  decided to deploy 2.25 x 8 mm Taxus Atom stent.  We deployed this with  one inflation up to 12 atmospheres for 30 seconds.  The patient  tolerated the procedure well and left the laboratory in satisfactory  condition.   RESULTS:  Initially, stenosis in the diagonal branch was estimated at  95%.  Following stenting it was improved to 0%.   CONCLUSION:  Successful PCI of the lesion in the diagonal branch to LAD  with improvement in central narrowing from 95% to 0%  using a drug-  eluting stent.   The patient returned to the recovery room for further observation.      Bruce Elvera Lennox Juanda Chance, MD, [employer]  Electronically Signed     BRB/MEDQ  D:  12/08/2007  T:  12/09/2007  Job:  [id number]   cc:   Delbert Harness, MD  Jonelle Sidle, MD

## 2010-09-29 NOTE — Letter (Signed)
February 13, 2008    Melvyn Novas, M.D.  [address]  [place], [place] [address]   RE:  TAKYAH, CIARAMITARO  MRN:  [id number]  /  DOB:  [birth year]   Dear Gerlene Burdock:   Ms. Philbert returns to the office for continued assessment and treatment  of coronary artery disease, now 3 months following placement of a drug-  eluting stent in her first diagonal vessel after she presented with  unstable angina.  She also has conduction system disease and a recently  implanted pacemaker.  Nonetheless, she suffered a fall due to a brief  syncopal spell in July.  That was apparently due to orthostatic  hypotension.  Since Benicar was discontinued, she has done well.  She  also has an early myelodysplastic syndrome followed by Dr. Cyndie Chime  that is not currently causing problems.  She has been treated for  hyperlipidemia, but I have no recent lipid profile.  She received  pneumococcal vaccine 3 months ago and would like [name] to administer  influenza vaccine.   CURRENT MEDICATIONS:  1. Levothyroxine 0.112 mg daily.  2. Rosuvastatin 20 mg daily.  3. Aspirin 325 mg daily.  4. Singulair p.r.n.  5. Clopidogrel 75 mg daily.  6. Pepcid 1 b.i.d.  7. Lorazepam 0.5 mg p.r.n.  8. Metformin 1000 mg b.i.d..   PHYSICAL EXAMINATION:  GENERAL:  Lambert Mody older woman in no acute distress.  VITAL SIGNS:  The weight is 121, stable.  Blood pressure 135/80 without  orthostatic change, heart rate 65 and regular, respirations 14.  NECK:  Questionable external jugular distention; normal carotid  upstrokes without bruits.  LUNGS:  Coarse rales at both bases, more prominent on the left.  CARDIAC:  Normal first and second heart sounds.  ABDOMEN:  Soft and nontender; no organomegaly.  EXTREMITIES:  No edema; distal pulses intact.   IMPRESSION:  Ms. Godino is doing well at present time.  It would be  reasonable to tolerate systolic blood pressures between 130 and 150 in  this diabetic woman, since she has some tendency  towards orthostatic  hypotension.  She will require aspirin and clopidogrel for at least 2  years.  We will check a lipid profile.  The last creatinine value we  have is 1.46 in June, which would raise some concern in a patient being  treated with metformin.  I assume that improved after by the car was  discontinued allowing metformin to be utilized.  I will plan to see this  nice woman again in 6 months.    Sincerely,      Gerrit Friends. Dietrich Pates, MD, [facility]  Electronically Signed    RMR/MedQ  DD: 02/13/2008  DT: 02/14/2008  Job #: [id number]

## 2010-09-29 NOTE — Discharge Summary (Signed)
[name], [name] NO.:  [id number]   MEDICAL RECORD NO.:  [id number]          PATIENT TYPE:  INP   LOCATION:  5501                         FACILITY:  [facility]   PHYSICIAN:  Alvester Morin, M.D.  DATE OF BIRTH:  1923/12/05   DATE OF ADMISSION:  12/10/2007  DATE OF DISCHARGE:  12/11/2007                               DISCHARGE SUMMARY   DISCHARGE DIAGNOSES:  1. Fracture of lateral right sixth rib and seventh rib secondary to      trauma from fall.  2. Asymptomatic hematuria.  3. Chronic macrocytic anemia with recent unrevealing workup in June      2009.  4. Chronic renal insufficiency.  5. Hypothyroidism.  6. Diabetes mellitus type 2 with hemoglobin A1c of 7.0 in June 2009.  7. Hypertension.  8. Hyperlipidemia.  9. Coronary artery disease status post cardiac catheterization showing      ejection fraction of 70% and placement of a drug-eluting stent in      June 2009.  10.History of symptomatic bradycardia status post [employer] pacemaker      in June 2009, for first and second degree atrioventricular block.  11.History of gastroesophageal reflux disease with gastric erosions      seen on EGD in June 2009.  12.History of bladder cancer in July 2003, status post chemotherapy.  13.History of cholecystectomy, appendectomy, and hysterectomy.   DISCHARGE MEDICATIONS:  1. Vicodin 5/500 mg 1-2 tablets p.o. q.6 h. p.r.n. for pain.  2. Synthroid 100 mcg p.o. daily.  3. Plavix 75 mg p.o. daily.  4. Metformin 1000 mg p.o. b.i.d.  5. Ecotrin 325 mg p.o. daily.  6. Crestor 20 mg p.o. daily.  7. Ferrous sulfate 325 mg p.o. daily.  8. Singulair 10 mg p.o. daily/p.r.n.  9. Pepcid 20 mg p.o. daily.  10.Ambien 5 mg p.o. nightly p.r.n. for insomnia.   DISPOSITION AND FOLLOWUP:  The patient is to follow up with her primary  care Candee Hoon, Dr. Janna Arch in [place] in 1-2 weeks after discharge.  At this time, the patient's breathing pain control should be assessed  due  to her recent traumatic rib fracture.  In addition, she should be  started on a calcium and vitamin D supplement as her serum 25-hydroxy  Vitamin D2 level was found to be low during her hospitilization.  In  addition, the patient's blood pressure should be checked as her Benicar  was discontinued upon discharge from the hospitalization due to low  normal blood pressures.  Furthermore, the patient is to follow up with  her Urologist at her earliest convenience.  At that time, the patient's  bladder should be assessed for a recurrence of her neoplasm as she was  found to have asymptomatic hematuria upon admission with an unrevealing  urinary tract infection work-up.   PROCEDURES PERFORMED:  A CT of head, cervical spine, chest, abdomen, and  pelvis was performed on December 10, 2007, and was within normal limits  except for a fracture of the lateral right sixth rib and probably her  seventh rib as well.  No signs of intracranial hemorrhage or  C-spine  fracture were noted.  Furthermore CT of her pelvis showed, her to have  an unremarkable bladder, but was positive for sigmoid diverticulosis.   BRIEF ADMITTING HISTORY AND PHYSICAL:  [name] is a pleasant 77-year-  old white female with a past medical history of pacemaker implantation  for symptomatic bradycardia, coronary artery disease status post drug-  eluting stent placement, chronic macrocytic anemia, diabetes mellitus  type 2, and history of bladder cancer in 2003 status post chemotherapy,  who presents to the emergency room after the patient fell at home and  hurt herself.  The patient states that approximately 2 o'clock in the  morning on day of admission, she was walking from the bathroom to her  bedroom when she suddenly fell on her right side.  The patient does not  recall tripping on anything and denies any loss of consciousness.  She  further denies any bowel or bladder incontinence after the event.  She  also denies any  precipitating events including feeling lightheaded,  chest pain, or palpitations.  The patient has no history of recurrent  falls in the past and denies any fevers, shortness of breath, abdominal  pain, or dysuria prior to the fall.  However, after the fall the patient  has been complaining of persistent pain on her lower right side of her  chest wall.   PHYSICAL EXAMINATION:  VITAL SIGNS:  Temperature was 96.1, blood  pressure of 94/58, pulse of 68, respiratory rate of 14, and oxygen  saturation of 97% on room air.  GENERAL:  The patient was in moderate distress, but was alert and  oriented x3.  HEENT:  Extraocular motions to be intact.  Pupils equal, round, reactive  to light and clear sclerae.  Oropharynx and oral cavity were clear.  NECK:  Supple without any JVD or lymphadenopathy or bruits.  RESPIRATORY:  Decreased inspiratory effort secondary to pain from her  right side.  Otherwise, the patient was clear to auscultation  bilaterally.  CARDIOVASCULAR:  Distant heart sounds, but regular rate and rhythm  without any murmurs, rubs or gallops.  GI:  Positive bowel sounds and a soft nondistended abdomen.  There is  some tenderness to palpation in the suprapubic region without guarding  or rebound.  There was also some point tenderness to the right upper  quadrant/side of chest wall following the lower ribs on the right side  without any associated costovertebral angle tenderness.  EXTREMITIES:  No clubbing, cyanosis or edema.  NEUROLOGIC:  The patient to be alert and oriented x 3.  Cranial nerves  II through XII to be intact.  A 5-/5 strength in all extremities and  normal sensation throughout.  PSYCHIATRY:  Appropriate.   The patient's white blood cell count was 9.6, hemoglobin of 8.7,  platelets of 113, and MCV of 101.9.  Sodium was 139, potassium was 4.0,  chloride was 106, bicarb was 26, BUN was 34, creatinine was 1.4, and  glucose was 135.  Urinalysis showed hyaline cast, 3-6  white blood cells,  and 7-10 red blood cells.   HOSPITAL COURSE BY PROBLEM:  Problem #1:  Fall with Associated Rib  Fracture-  The patient's rib fracture was managed with adequate pain  control during her hospitalization with follow up chest x-ray showing no  signs of pulmonary contusion.  She was discharged from the hospital in  stable condition with a prescription for Vicodin to help control the  pain from her rib fracture.  It was thought  that her fall was secondary  to combination of living in a cluttered house per the patient's  daughter, and recent start of a benzodiazepine.  Therefore, upon the  patient's discharge the patient was discontinued from the lorazepam and  Ambien was substituted to help with her insomnia.  Furthermore,  postdischarge results of the patient's serum vitamin D2 level showed her  to be low at 24 ng/mL.  It is recommended that she be started on a  calcium and vitamin D combination as an outpatient.  Cardiology was  consulted and subsequently ruled out a malfunctioning of the patient's  pacemaker as well as an acute coronary syndrome as the cause of the  patient's fall.  Other etiologies on the differential, which were  subsequently ruled out via history and labs included orthostatic  hypotension, TIAs/stroke, hypoglycemia, seizure, cardiac valvular  disorder, pulmonary embolism, and infectious etiologies.  Problem #2:  Hypertension-  Although, the patient was on  antihypertensive medication at home, her admission blood pressure was  only 94/58.  Therefore, her Benicar was held and subsequently  discontinued altogether upon her discharge.  Throughout her  hospitalization, the patient's blood pressure remained mainly in the  110's over 50's.  Problem #3:  Hematuria-  Upon admission, the patient was found to have  mild amounts of blood and small amount of white blood cell in her urine.  The patient denied any costovertebral angle tenderness or urinary tract   symptoms including dysuria and frequency.  On exam the patient was found  to be slightly tender to palpation in her suprapubic region, but this  was thought to be secondary to her recent fall.  The patient's urine  culture showed no growth, so unlikely to be infectious etiology.  With  her history of bladder cancer, however, she was instructed to follow up  with her urologist upon discharge for further evaluation possibly  including a cystoscopy as an outpatient.  Problem #4:  Hypothyroidism-  The patient's TSH was found to be low  normal at 0.36.  Therefore, her home Synthroid dose of 112 mcg was  decreased to 100 mcg per day upon discharge.  Problem #5:  Hyperlipidemia-  The patient's statin therapy was held upon  admission and then restarted upon discharge.  Her fasting lipid profile  during her hospital course revealed a total cholesterol of 109 with TG  of 176, HDL of 35, and LDL of 39.  Problem #6:  Diabetes Mellitus 2-  The patient's hemoglobin A1c was  borderline high at 7.0 in June 2009.  She was recently started on  metformin by her primary care Thi Klich.  However, her metformin was held  during her admission and fasting blood glucose on day of discharge was  106.  The patient was only on a sliding scale insulin during her  hospitalization and was restarted on her metformin upon discharge.  Problem #7.  Chronic Renal Insufficiency:  The patient was gently  hydrated with IV normal saline during her hospitalization and her  creatinine dropped from 1.40 to 1.03 upon discharge with a subsequent  drop in her BUN from 34 to 17.   DISCHARGE LABORATORY DATA AND VITALS:  On day of discharge, the patient  had a temperature of 97.8, blood pressure of 110/50, pulse of 62,  respiratory rate of 16, and sating 99% on 2 liters.  The patient had a  sodium of 139, potassium of 4.0, chloride of 105, bicarb of 29, BUN of  17, creatinine of 1.04,  glucose of 107, albumin was 2.8, calcium was  8.4,  phosphorus was 3.8, and magnesium was 2.0.      Lucy Antigua, MD  Electronically Signed      Alvester Morin, M.D.  Electronically Signed    RK/MEDQ  D:  12/12/2007  T:  12/13/2007  Job:  [id number]   cc:   Melvyn Novas, MD

## 2010-09-29 NOTE — Consult Note (Signed)
NAMEJANAYLA, MARIK NO.:  [id number]   MEDICAL RECORD NO.:  [id number]          PATIENT TYPE:  INP   LOCATION:  5501                         FACILITY:  [facility]   PHYSICIAN:  Gabrielle Dare. Janee Morn, M.D.DATE OF BIRTH:  [birth year]   DATE OF CONSULTATION:  12/11/2007  DATE OF DISCHARGE:                                 CONSULTATION   HISTORY OF PRESENT ILLNESS:  [name] is an 77 year old white female  who was admitted on December 10, 2007 after a fall at home.  She fell  walking to her bedroom and hit her right side.  She was evaluated in the  emergency department and workup showed right rib fractures, #6 and #7.  She was admitted to the Teaching Service.  As she has multiple medical  problems, we were asked to evaluate for further treatment of her rib  fractures.  Currently, she is complaining of some localized pain along  her right ribs extending upwards and posteriorly there is some  exacerbation of pain with deep inspiration.   PAST MEDICAL HISTORY:  Coronary artery disease secondary to AV block  with pacemaker placement.  She also had cardiac stents placed in June  2009.  She has diabetes mellitus, hypothyroidism, GERD, and chronic  renal insufficiency.   PAST SURGICAL HISTORY:  Hysterectomy and cholecystectomy many years ago.   CURRENT MEDICATIONS:  Plavix, Synthroid, Protonix, Percocet, aspirin,  NovoLog sliding scale, morphine, Zofran, and Colace.   ALLERGIES:  PENICILLIN, SULFA, and CODEINE.   REVIEW OF SYSTEMS:  MUSCULOSKELETAL:  As above.  PULMONARY:  She does  have some pain with increased breathing.  Remainder of review of systems  is unremarkable.   PHYSICAL EXAMINATION:  VITAL SIGNS:  Temperature 98.1, heart rate 60,  respirations 16, blood pressure 90/50 and respirations are 100% on 2 L  nasal cannula oxygen.  GENERAL:  She is awake and alert.  She is oriented.  She follows  commands.  She moves all extremities well.  HEENT:  Normocephalic with no  evidence of trauma.  Face is stable.  Pupils are equal and reactive.  Sclerae are clear.  NECK:  Has some tenderness in the anterior down the right side above the  clavicle.  No evidence of significant tenderness.  LUNGS:  Clear to auscultation.  She has a decent respiratory effort.  She has tenderness along the right ribs laterally in her right chest  wall more anteriorly.  She has no crepitance.  ABDOMEN:  Soft and nontender.  Bowel sounds are active.  There is no  distention.  No masses are palpated.  No organomegaly is noted.  EXTREMITIES:  No deformity or significant tenderness noted.   Data reviewed include CT scan results, CT scan of head is negative.  CT  scan of the cervical spine shows degenerative changes, but no acute  findings.  CT scan of the chest shows right rib fractures, #6 and #7.  CT scan of the abdomen and pelvis is negative.   IMPRESSION:  An 77 year old female, status post fall with right rib  fractures, #6 and #7.  RECOMMENDATIONS:  1. Increasing pulmonary toilet.  We will have respiratory therapy      evaluation and begin incentive spirometry.  She may need some      bronchodilators, but we will see how see does.  2. Continue pain medication as ordered.  3. We will follow along with you.      Gabrielle Dare Janee Morn, M.D.  Electronically Signed     BET/MEDQ  D:  12/11/2007  T:  12/12/2007  Job:  [id number]   cc:   Alvester Morin, M.D.

## 2010-09-29 NOTE — Discharge Summary (Signed)
[name], [name] NO.:  [id number]   MEDICAL RECORD NO.:  [id number]          PATIENT TYPE:  INP   LOCATION:  3740                         FACILITY:  [facility]   PHYSICIAN:  Duke Salvia, MD, FACCDATE OF BIRTH:  1923/06/05   DATE OF ADMISSION:  10/23/2007  DATE OF DISCHARGE:  10/30/2007                               DISCHARGE SUMMARY   ALLERGIES:  This patient has allergies and they are to CODEINE, SULFA,  and PENICILLIN.   TIME FOR THIS DICTATION AND EXAM:  Greater than 45 minutes.   FINAL DIAGNOSES:  1. Admitted after 6 days of substernal chest pain/symptoms include      presyncope with concurrence bradycardia, second-degree heart block,      and profound first-degree atrioventricular block.      a.     The patient had concomitant dizziness and nausea during 6       days prior to admission history.      b.     Troponin I studies are 0.01, then 0.01, then 0.04, and then       0.07.  2. Left heart catheterization performed on October 23, 2007, ejection      fraction is 70%.  There was a 95% ostial stenosis in the first      diagonal.  There was 70% proximal stenosis in the right      posterolateral branch.  The left circumflex in the left anterior      descending then had isolated 20-40% stenoses.  3. Percutaneous coronary intervention on October 23, 2007, Taxus drug-      eluting stent placed at the ostium of the first diagonal with a      reduction of the stenosis from 95% to 0, minimal impact on the left      anterior descending.  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  4. Symptomatic bradycardia.      a.     Medtronic VERSA dual-chamber pacemaker implanted on October 25, 2007, Dr. Sherryl Manges.  5. Persistent nausea after the cath and after the pacemaker.      a.     Gastrointestinal consult.      b.     Esophagogastroduodenoscopy on October 27, 2007, demonstrating       gastric erosions.  Recommendation  was PPI twice daily.      c.     If nausea persists per gastrointestinal, gastric emptying       study.      d.     The patient better on Reglan therapy (Reglan have been       recommended in the past by Pulmonology when she was seen for       cough/hoarseness) possibly a reflux process.  6. Anemia with hemoglobin of 9.7 on admission.      a.     Hematology consult.      b.     Labs this admission including vitamin B12, folate, ferritin,  and iron were all within normal limits.  The patient also had a       multiple myeloma panel, which showed faint restricted bands in the       gamma region, which could not be completed or excluded.      c.     Dr. Cyndie Chime in attendance will schedule outpatient bone       marrow biopsy and will call the patient as an outpatient.  Her       telephone number is [phone].  7. Debilitation.      a.     Daily ambulations with cardiac rehab.  The patient is       getting a little stronger, but she will need outpatient OT/PT and       to use walker at all times.  8. Elevated hemoglobin A1c of 7.0 this admission.  She is asked to see      Dr. Delbert Harness for elevated blood sugars.  For example on October 24, 2007, at 2 o'clock in the morning, they were 203 and at 11 o'clock      on October 27, 2007, of 243.   SECONDARY DIAGNOSES:  1. Hypertension.  2. Dyslipidemia.  3. Extracranial cerebrovascular occlusive disease.  The patient had      ultrasound on August 18, 2007, 50% right internal carotid artery      stenosis and a 7-mm left internal carotid artery aneurysm.  4. Asthmatic bronchitis.  5. Gastroesophageal reflux disease.  6. Hypothyroidism.  7. Status post hysterectomy, cholecystectomy, and appendectomy.  8. History of bladder cancer diagnosed in July 2003, chemotherapy per      Dr. Etta Grandchild with recurrence in October 2003.   PROCEDURES:  1. Left heart catheterization on October 23, 2007, ejection fraction 70%      with 95% ostial stenosis in the  first diagonal.  2. Percutaneous coronary intervention on October 23, 2007, Taxus drug-      eluting stent reducing 95% ostial diagonal stenosis to 0.  3. On October 25, 2007, implant of Medtronic VERSA dual-chamber pacemaker      with symptomatic bradycardia/presyncope.   BRIEF HISTORY:  [name] is an 77 year old female.  She presents with a  6-day history of substernal chest pressure/presyncope.  She has  bradycardia and second-degree type 2 heart block.   Her symptoms started on Wednesday, October 18, 2007, she began to experience  orthostasis and presyncope.  She noted irregularity in her heart rhythm.  She also had exertional substernal chest pressure and heaviness, which  was associated with dyspnea and nausea.  The symptoms would come and go  intermittently and would resolve spontaneously.   The patient saw her primary caregiver on October 23, 2007, he sent her to  [facility].  She was found to be bradycardic with first-degree  AV block as well as second-degree high-grade heart block.  She was then  transferred to [facility] where she continues to complain of mild chest  pressure, dizziness, lightheadedness, especially when trying to sit up,  her heart rates were in the 40s.  The patient is being admitted for  unstable angina and symptomatic bradycardia.  She will be maintained on  the IV nitroglycerin, which was started at [facility].  Cardiac markers  will continue to be cycled, but she will have urgent catheterization in  the afternoon of October 23, 2007.  With regard to bradycardia, she will  probably require permanent pacemaker.  At the time of admission, she is  on Plavix, aspirin, and statin.  She will not have a beta-blocker  secondary to bradycardia.   HOSPITAL COURSE:  The patient presents to [facility] in  transfer from [facility] complaining of substernal chest pressure,  presyncope, nausea, and dizziness.  She went to the cath lab in urgent  fashion where it was  found that she had a 95% ostial stenosis in the  first diagonal.  This was rapidly treated with a drug-eluting stent by  Dr. Charlies Constable.  He thought that the residual disease in the RPLS  could be treated medically.  She was seen in consultation by Dr. Sherryl Manges on October 25, 2007, who recommended implantation of permanent  pacemaker.  This was done the same day.  Because she was also  complaining of nausea whenever she assumed an upright position, a  Gastroenterology consult was obtained.  Because she had anemia on  admission with a hemoglobin 9.7, a Hematology consult was also obtained.  Both of these were accomplished before implantation of the pacemaker.  Per GI, she was scheduled for EGD, which showed gastric erosions.  She  was placed on proton pump inhibitor with the proviso that if nausea  persists, she would have a gastric emptying study.  Hematology  recommended a series of blood studies.  They all came back negative or  within normal limits, and so Dr. Cyndie Chime will call the patient when  she has been discharged for an outpatient bone marrow biopsy.  The  patient was also relatively debilitated.  She required several days of  two-person assist with a walker.  Before her gait would actually  stabilize to some extent, she will still need to use a walker at all  times and she will have outpatient home health and occupational therapy.  It is true that her exercise tolerance has gotten better.  Her nausea  has actually resolved at the time of discharge.  She will be going home  on October 30, 2007.   She has the following medications:  1. Darvocet-N 100, 1-2 tablets every 4-6 hours as needed for pain.  2. Enteric-coated aspirin 325 mg daily.  This is a new dose      recommended after stenting.  3. Plavix 75 mg daily.  4. Levothyroxine 112 mcg daily.  5. Benicar HCT 40/12.5, 1 tablet daily.  6. Crestor 20 mg daily at bedtime.  7. Pepcid 20 mg twice daily.  8. Reglan, a new  medication, 1 tablet before each meal and at bedtime.      This may help with her nausea.  9. Singulair 10 mg daily.  10.A new medication, Xanax 0.25 mg twice daily as needed.   PLAN:  1. The patient will follow up with Dr. Cyndie Chime, hematologist.  He      will call with an appointment for a bone marrow biopsy.  Once      again, her telephone number is [phone].  2. Follow up with [facility] [address]  She is      asked to keep her incision dry until Wednesday, November 01, 2007,      pacer clinic is Monday at November 13, 2007, at 9:40.  She will see Dr.      Graciela Husbands, Tuesday, January 30, 2008 at 11.  3. Follow up with [facility] Main office.  This is      located by [employer].  She sees  Dr. Dietrich Pates, Wednesday, November 08, 2007, at 10:30 in the morning.  This was a post cath visit.  4. The patient is asked to arrange to see Dr. Delbert Harness about elevated      blood sugars this admission.  Remember her hemoglobin A1c was 7,      and she has several glucose determinations greater than 200 this      admission.   LABORATORY STUDIES:  TSH, which was 1.489.  In the setting of nausea,  her amylase was 122 within normal limits.  Lipase 34 within normal  limits.  Alkaline phosphatase 28, SGOT 22, SGPT of 17, and Hemoccult was  negative.  On October 27, 2007, sodium is 136, potassium 4.7, chloride 99,  carbonate 26, BUN is 24, creatinine is 1.12, and glucose is 243.  Complete blood count on October 27, 2007, hemoglobin 9.3, hematocrit 28.1,  white cells 9.2, platelets 93, and mean cell volume is 103.5; however,  vitamin B12 is within normal limits.  This patient did not receive a  transfusion this admission.  Protime this admission 13.4 and INR is 1.  The troponin I studies are 0.01,  0.04, then 0.07.  Magnesium this admission is 2.  We will just repeat  the studies iron is 51, TIBC is 359, saturations are 14%, reticulocyte  count was 2.1 within normal limits,  folate was 19.7, vitamin B12 was  391, ferritin 72, and erythropoietin 52.3.      Maple Mirza, [place]      Duke Salvia, MD, [facility]  Electronically Signed    GM/MEDQ  D:  10/30/2007  T:  10/31/2007  Job:  [id number]   cc:   Delbert Harness, MD  Gerrit Friends. Dietrich Pates, MD, [facility]  Genene Churn. Cyndie Chime, M.D.  Barbette Hair. Arlyce Dice, MD,FACG

## 2010-09-29 NOTE — Consult Note (Signed)
[name], FIKES NO.:  [id number]   MEDICAL RECORD NO.:  [id number]          PATIENT TYPE:  INP   LOCATION:  2919                         FACILITY:  [facility]   PHYSICIAN:  Genene Churn. Granf[place], M.D.DATE OF BIRTH:  02-08-1924   DATE OF CONSULTATION:  DATE OF DISCHARGE:                                 CONSULTATION   CHIEF COMPLAINT:  Consultation called by Dr. Daleen Squibb, [facility]  for anemia and thrombocytopenia.   HISTORY OF PRESENT ILLNESS:  This is an 77 year old white female with a  past medical history of hypertension, hyperlipidemia, hypothyroidism,  and peripheral vascular disease who presented to [facility] with  presyncope and substernal chest pressure associated with an irregular  heart rhythm.  She was admitted on October 23, 2007.  She is found to be in  first degree heart block and type 2 secondary heart block with variable  block.  Her heart rate at that time was in the 40s.  She underwent a  cardiac catheterization and was to found to have a 95% diagonal ostial  lesion and underwent a stent procedure that was successful.  At the time  of admission her hemoglobin was found to be 9.7 and her platelet count  was 114.  Prior to this admission she has had some decreased energy  level and intermittent dizziness especially when she stands from a  seated or lying position.  However, these symptoms were becoming worse  one week prior to admission.   PAST MEDICAL HISTORY:  1. 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  Her last pathology report on the      computer was a low-grade noninvasional erythro carcinoma which was      done on August 2004.  She did have BCG instillation for      chemotherapy in October 2003.  She also had mitomycin C      instillation August 2004.  2. Hypertension.  3. Hyperlipidemia.  4. Peripheral vascular  disease.  5. Asthmatic bronchitis/allergic rhinitis.  6. GERD.  7. Hypothyroidism.  8. Coronary artery disease.  9. Status post hysterectomy, remote.  10.Status post appendectomy, remote.  11.Status post cholecystectomy, remote.   MEDICATIONS:  Medications in the hospital she is currently on  1. Aspirin 325 daily.  2. Synthroid 112 mcg daily.  3. Benicar 40 mg daily.  4. Hydrochlorothiazide 12.5 mg daily.  5. Crestor 20 mg daily.  6. Plavix 75 mg daily.   OUTPATIENT MEDICATIONS:  1. Synthroid 112 mcg daily.  2. Plavix 75 mg daily.  3. Aspirin 81 mg daily.  4. Benicar/hydrochlorothiazide 40/12.5 mg daily.  5. Crestor 20 mg daily.  6. Singulair 10 mg daily.   ALLERGIES:  1. CODEINE.  2. SULFA.  3. PENICILLIN.   FAMILY HISTORY:  Mother died at age 78 of pneumonia, father died at age  86.  She had a brother who died of lung cancer at age 25.  Her children  are all in good health.   SOCIAL HISTORY:  She lives alone at [facility].  She denied any tobacco,  alcohol, or drug use in the past.   REVIEW OF SYSTEMS:  Positive for chest pressure with movement.  Positive  dizziness when she sits up or walks.  Negative abdominal pain.  Negative  hematuria.  Negative melena.  Negative bleeding from gums and negative  for easy bruising.   PHYSICAL EXAMINATION:  VITAL SIGNS:  T-max of 98.7, pulse of 39 and  regular, respiratory rate of 15, blood pressure of 103/37, and sating  97%.  GENERAL:  This is a very pleasant white woman lying down in no acute  distress.  HEENT:  Pupils were equal, round, and reactive to light.  No nystagmus.  No icterus.  Oropharynx clear.  No petechiae.  No adenopathy.  No  ulcers.  LUNGS:  Clear to auscultation bilaterally.  HEART:  Distant heart sounds which were regular and no murmur  appreciated.  ABDOMEN:  Soft, nontender, nondistended.  Positive bowel sounds.  No  organomegaly.  PERIPHERY:  No edema, clubbing, or cyanosis.  NEURO:  Alert and  oriented x3.  Cranial nerves were intact.  Sensory was  intact.  Muscle strength was normal throughout.   IMPRESSION:  Moderate macrocytic anemia with associated mild  thrombocytopenia in an elderly woman on no obvious marrow suprressive  medications  Most likely etiology is a developing myelodysplastic syndrome.  Differential also includes B12, folic acid deficiency, multiple myeloma,  and other bone marrow disorders.  I would also consider the possibility  of a medication effect given acute fall in platelets consistent with  February 9 values, however, she has not started any new meds recently.   RECOMMENDATIONS:  1. Okay to proceed with pacemaker procedure today.  2. She is on two antiplatelet agents and although currently her      thrombocytopenia is mild, would avoid using heparin or Lovenox.  3. Probable bone marrow biopsy as an outpatient if outstanding labs do      not provide diagnosis.  4. Transfuse p.r.n. if hemoglobin is less than 8.   Thank you for this consultation      Hollace Hayward, M.D.  Electronically Signed      Genene Churn. Cyndie Chime, M.D.  Electronically Signed    TE/MEDQ  D:  10/25/2007  T:  10/26/2007  Job:  [id number]

## 2010-09-29 NOTE — Cardiovascular Report (Signed)
[name], PERROW NO.:  [id number]   MEDICAL RECORD NO.:  [id number]          PATIENT TYPE:  INP   LOCATION:  2919                         FACILITY:  [facility]   PHYSICIAN:  Everardo Beals. Juanda Chance, MD, FACCDATE OF BIRTH:  05-05-1924   DATE OF PROCEDURE:  DATE OF DISCHARGE:                            CARDIAC CATHETERIZATION   CLINICAL HISTORY:  Ms. [name] is 77 years old and has no known prior  heart disease.  She is admitted to the hospital with chest pain, thought  to represent unstable angina and type 1 second-degree AV block with some  intermittent third-degree AV block.  The diagnostic study was performed  by Dr. Diona Browner, which showed a 95% stenosis and large diagonal branch  of the LAD at the ostium of the diagonal branch as well as 80-90% lesion  in the distal right coronary extending into the posterior descending  branch.  We made a decision to see with intervention of the diagonal  branch today.   PROCEDURE:  The procedure was performed of the right femoral arterial  sheath and a 6-French Q 3.5 guiding catheter with side holes.  The  patient was given antiemetics and bolus infusion.  The patient had been  on Plavix therapy.  I believe she is on Plavix because of carotid  disease.  She did get 4 chewable aspirin today.  We crossed the lesion  in diagonal branch with PTT light support wire without difficulty.  We  attempted to cross with a 2.25 mm x 10 mm cutting balloon, but this was  unable to cross.  We then crossed with a 2.0 x 15 mm apex.  We crossed  the lesion and dilated up to 8 atmospheres for 30 seconds.  We then were  able to pass the 2.25 mm x 10 mm cutting balloon across the lesion and  we performed 3 inflations up to 10 atmospheres for 40 seconds.  This  gave [name] suboptimal result and we felt that we need to stent the branch.  We felt not to stent the vessel because we had some concern that it  might compromise the diagonal branch.  We chose a 2.25 mm  x 8 mm Taxus-  Atom stent and we deployed this with 1 inflation up to 14 atmospheres  for 30 seconds.  Final diagnostics were then performed through guiding  catheter.  The patient tolerated the procedure well and left the  laboratory in satisfactory condition.   RESULTS:  Initially, the stenosis at the ostium of diagonal branches  were estimated at 95%.  Following stenting, this improved to 0%.  There  was only mild compromise of the LAD.   CONCLUSION:  Successful PCI of the ostial lesion in the diagonal branch  of LAD using a Taxus-Atom drug-eluting stent with improvement in center  of narrowing from 95% to 0%.   DISPOSITION:  The patient __________ for further observation.  We will  review the films with my colleagues tomorrow and make a decision whether  we should perform a stage intervention on the lesion in the distal right  coronary.  The patient may also require pacing for high-degree A-V block  __________  type 1 AV block.      Bruce Elvera Lennox Juanda Chance, MD, [facility]  Electronically Signed    BRB/MEDQ  D:  10/23/2007  T:  10/24/2007  Job:  [id number]   cc:   Thomas C. Daleen Squibb, MD, [facility]  Jonelle Sidle, MD

## 2010-10-02 NOTE — Procedures (Signed)
[facility]  Patient:    [name], [name]                       MRN: [id number] Proc. Date: 08/31/00 Attending:  Winn Jock. Smitty Cords, M.D. CC:         Dr. Raquel Sarna, [facility], [facility]   Procedure Report  PROCEDURE:  Stress adenosine Cardiolite.  PRIMARY PURPOSE:  Chest pain.  CLINICAL NOTE:  Medical history indicates this to be a 77 year old widow who has had atypical chest pain primarily manifested by discomfort over the right sternum and up into the right neck area, also a history in the lower epigastric area with some discomfort and a history of hiatal hernia.  She denies any relationship to physical activity.  It usually comes on spontaneously, unassociated with other symptoms, although she does have complaints of some insomnia and has had a significant history of mild depression and anxiety.  PAST MEDICAL HISTORY:  Her past medical history likewise indicates she has had major surgery, including cholecystectomy, hysterectomy, and a subtotal thyroidectomy.  REVIEW OF SYSTEMS:  Her review of systems likewise reveals that she is now a widow living alone, has three children, living and well.  FAMILY HISTORY:  Her family history is not remarkable.  Her father had hypertension.  Her mother lived to be in her 9s.  MEDICATIONS:  Zoloft one daily, Synthroid dosage not known, Xanax 0.25 mg at bedtime.  PHYSICAL EXAMINATION:  VITAL SIGNS:  Weight 124; blood pressure 140/80 supine; pulse 85, regular; temperature 98.6; respirations quiet, no wheezing.  HEENT:  Negative.  NECK:  Thyroid scar, well-healed.  No carotid bruits.  CHEST:  Clear.  No wheezing or rales.  CARDIAC:  Normal in rhythm.  No significant murmur noted.  ABDOMEN:  Soft, nontender.  EXTREMITIES:  Good pedal pulses.  LABORATORY DATA:  Resting EKG reveals what appears to be an IV conduction defect, possibly related to primary voltage changes and cannot be  interpreted as necessarily left ventricular hypertrophy.  DESCRIPTION OF PROCEDURE:  The procedure consisted of IV adenosine given over a five-minute period of time.  There was a transient drop in the blood pressure to 110/70.  She complained of slight burning in her throat, and her blood pressure was associated with some symptoms but not notable on the monitor.  She completed the procedure without incident, slight dizziness immediately afterwards.  The perfusion scan will be in progress, and an additional note will be provided in regard to its results.  It is anticipated that the stress procedure is all she will need; however, if there is any doubt, she will be returned to the lab tomorrow for a second injection.  IMPRESSION:  No positive findings following stress adenosine.  A chest x-ray pending to rule out cardiac size changes. DD:  08/31/00 TD:  08/31/00 Job: [id number]

## 2010-10-02 NOTE — Assessment & Plan Note (Signed)
[facility]                               PULMONARY OFFICE NOTE   [name], [name]                        MRN:          [id number]  DATE:03/11/2006                            DOB:          1923-08-07    PROBLEMS:  1. Asthmatic bronchitis.  2. Allergic rhinitis.  3. Bladder cancer.  4. Esophageal reflux.   HISTORY:  She had seen the nurse practitioner with an acute upper  respiratory infection treated with a Z-Pak, Mucinex, and cough syrup on  September 4.  She had seen Dr. Pollyann Kennedy for persistent cough and globus  complaints.  He found no evidence of sinus disease and suspected reflux-  induced laryngitis, recommending a proton pump inhibitor and to hold off  Reglan.  He provided samples of a blue and white capsule she says helped but  she does not know what it was.  She has had flu vaccine.  Now she reports  she is still having some cough but better.  She wakes in the morning hoarse.  She does not remember trying Reglan.  She does not feel heartburn and does  not choke when she is swallowing.   MEDICATION:  1. Synthroid 112 mcg.  2. Benicar 20 mg b.i.d.  3. Amitriptyline 10 mg x2 at h.s.  4. Metformin 1000 mg b.i.d.  5. Aspirin 81 mg.  6. Crestor 20 mg.  7. Mucinex DM.  8. Asmanex.  9. Tessalon Perles.   OBJECTIVE:  Weight 114 pounds, BP 132/82, pulse regular 77, room air  saturation 97%.  She had a few squeaks on chest exam but no cough, wheeze,  rhonchi or dullness, and work of breathing was not increased.  Lung sounds  were actually rather quiet.  Heart sounds regular without murmur.  Pharynx  may be minimally reddened, nonspecific.   IMPRESSION:  Cough, improved after apparently treating as a reflux process  with underlying bronchitis.   PLAN:  1. Try available samples AcipHex 20 mg one q.a.m.  2. Reflux precautions.  3. Continue other medications.   She will follow with her primary physicians as already directed.  Schedule  return 6 months but earlier, suggested 2 weeks, if not improved.     Clinton D. Maple Hudson, MD, FCCP, FACP    CDY/MedQ  DD: 03/13/2006  DT: 03/14/2006  Job #: [id number]   cc:   Enrigue Catena H. Pollyann Kennedy, MD  [facility]

## 2010-10-02 NOTE — Op Note (Signed)
   NAME:  [name], [name]                           ACCOUNT NO.:  [id number]   MEDICAL RECORD NO.:  [id number]                   PATIENT TYPE:  AMB   LOCATION:  [facility]                                 FACILITY:  [facility]   PHYSICIAN:  Claudette Laws, M.D.               DATE OF BIRTH:  Jan 09, 1924   DATE OF PROCEDURE:  12/27/2002  DATE OF DISCHARGE:                                 OPERATIVE REPORT   PREOPERATIVE DIAGNOSIS:  Superficial papillary bladder carcinoma.   POSTOPERATIVE DIAGNOSIS:  Superficial papillary bladder carcinoma.   OPERATIONS:  1. Cystoscopy.  2. Cold cup biopsy, bladder tumor.  3. Fulguration, multiple bladder lesions.  4. Instillation of 40 mg of mitomycin C in 40 mL of sterile water.   SURGEON:  Claudette Laws, M.D.   DESCRIPTION OF PROCEDURE:  The patient was prepped and draped in the dorsal  lithotomy position under LMA anesthesia.  Cystoscopy was performed with both  the 12 degree and 70 degree lens using the camera.  She was noted to have a  small papillary superficial-appearing lesion along the midline of the  posterior bladder wall.  This was biopsied with cold cup forceps and then  the base was fulgurated with the Bugbee electrode at 30 coagulation and also  on a centimeter area around it.  There were also other erythematous areas,  which we fulgurated with the Bugbee electrode.  We could see some scarring  in the bladder from prior bladder resections.  The ureteral orifices were  normal.  There were no calculi, +1 trabeculation.  Some of the findings were  hard to differentiate between BCG effect and actual carcinoma in situ.  At  the conclusion the bladder was emptied and 40 mg of mitomycin C in 40 mL of  sterile water was instilled through a three-way Foley catheter, which was  then plugged.  The plan was to retain this for one hour and then irrigate  the bladder with 1 L of saline.  The patient was then taken back to the  recovery room in satisfactory  condition.                                                Claudette Laws, M.D.    RFS/MEDQ  D:  12/27/2002  T:  12/27/2002  Job:  [id number]

## 2010-10-06 ENCOUNTER — Other Ambulatory Visit: Payer: Self-pay | Admitting: Family Medicine

## 2010-10-06 DIAGNOSIS — Z1231 Encounter for screening mammogram for malignant neoplasm of breast: Secondary | ICD-10-CM

## 2010-10-20 ENCOUNTER — Other Ambulatory Visit: Payer: Self-pay | Admitting: Oncology

## 2010-10-20 ENCOUNTER — Encounter (HOSPITAL_BASED_OUTPATIENT_CLINIC_OR_DEPARTMENT_OTHER): Payer: Medicare Other | Admitting: Oncology

## 2010-10-20 DIAGNOSIS — D696 Thrombocytopenia, unspecified: Secondary | ICD-10-CM

## 2010-10-20 DIAGNOSIS — D649 Anemia, unspecified: Secondary | ICD-10-CM

## 2010-10-20 DIAGNOSIS — E039 Hypothyroidism, unspecified: Secondary | ICD-10-CM

## 2010-10-20 LAB — CBC WITH DIFFERENTIAL/PLATELET
BASO%: 0.2 % (ref 0.0–2.0)
LYMPH%: 15.1 % (ref 14.0–49.7)
MCH: 30.5 pg (ref 25.1–34.0)
MCHC: 32.9 g/dL (ref 31.5–36.0)
MCV: 92.7 fL (ref 79.5–101.0)
MONO%: 8.3 % (ref 0.0–14.0)
Platelets: 127 10*3/uL — ABNORMAL LOW (ref 145–400)
RBC: 2.97 10*6/uL — ABNORMAL LOW (ref 3.70–5.45)

## 2010-10-21 ENCOUNTER — Telehealth: Payer: Self-pay | Admitting: Internal Medicine

## 2010-10-21 NOTE — Telephone Encounter (Signed)
Faxed LOV, Pacer Check & Stress to [facility] @ [facility] ([phone]).

## 2010-10-22 ENCOUNTER — Emergency Department (HOSPITAL_BASED_OUTPATIENT_CLINIC_OR_DEPARTMENT_OTHER)
Admission: EM | Admit: 2010-10-22 | Discharge: 2010-10-22 | Disposition: A | Discharge status: Home / Self Care | Payer: Medicare Other | Attending: Emergency Medicine | Admitting: Emergency Medicine

## 2010-10-22 ENCOUNTER — Emergency Department (INDEPENDENT_AMBULATORY_CARE_PROVIDER_SITE_OTHER): Payer: Medicare Other

## 2010-10-22 DIAGNOSIS — M542 Cervicalgia: Secondary | ICD-10-CM

## 2010-10-22 DIAGNOSIS — S0003XA Contusion of scalp, initial encounter: Secondary | ICD-10-CM

## 2010-10-22 DIAGNOSIS — W19XXXA Unspecified fall, initial encounter: Secondary | ICD-10-CM

## 2010-10-22 DIAGNOSIS — Y92009 Unspecified place in unspecified non-institutional (private) residence as the place of occurrence of the external cause: Secondary | ICD-10-CM | POA: Insufficient documentation

## 2010-10-22 DIAGNOSIS — M4802 Spinal stenosis, cervical region: Secondary | ICD-10-CM

## 2010-10-22 DIAGNOSIS — E119 Type 2 diabetes mellitus without complications: Secondary | ICD-10-CM | POA: Insufficient documentation

## 2010-10-22 DIAGNOSIS — S0083XA Contusion of other part of head, initial encounter: Secondary | ICD-10-CM | POA: Insufficient documentation

## 2010-10-22 DIAGNOSIS — Z950 Presence of cardiac pacemaker: Secondary | ICD-10-CM | POA: Insufficient documentation

## 2010-10-22 DIAGNOSIS — I10 Essential (primary) hypertension: Secondary | ICD-10-CM | POA: Insufficient documentation

## 2010-10-22 DIAGNOSIS — M47812 Spondylosis without myelopathy or radiculopathy, cervical region: Secondary | ICD-10-CM

## 2010-10-22 DIAGNOSIS — M5030 Other cervical disc degeneration, unspecified cervical region: Secondary | ICD-10-CM

## 2010-10-22 DIAGNOSIS — Z79899 Other long term (current) drug therapy: Secondary | ICD-10-CM | POA: Insufficient documentation

## 2010-10-22 DIAGNOSIS — D649 Anemia, unspecified: Secondary | ICD-10-CM | POA: Insufficient documentation

## 2010-10-22 DIAGNOSIS — M25529 Pain in unspecified elbow: Secondary | ICD-10-CM

## 2010-10-22 DIAGNOSIS — Z7982 Long term (current) use of aspirin: Secondary | ICD-10-CM | POA: Insufficient documentation

## 2010-10-22 DIAGNOSIS — R55 Syncope and collapse: Secondary | ICD-10-CM

## 2010-10-22 DIAGNOSIS — IMO0002 Reserved for concepts with insufficient information to code with codable children: Secondary | ICD-10-CM | POA: Insufficient documentation

## 2010-10-22 DIAGNOSIS — E039 Hypothyroidism, unspecified: Secondary | ICD-10-CM | POA: Insufficient documentation

## 2010-10-22 DIAGNOSIS — E7800 Pure hypercholesterolemia, unspecified: Secondary | ICD-10-CM | POA: Insufficient documentation

## 2010-10-22 LAB — BASIC METABOLIC PANEL
Chloride: 99 mEq/L (ref 96–112)
Creatinine, Ser: 1.4 mg/dL — ABNORMAL HIGH (ref 0.4–1.2)
GFR calc Af Amer: 43 mL/min — ABNORMAL LOW (ref >60)
GFR calc non Af Amer: 36 mL/min — ABNORMAL LOW (ref >60)

## 2010-10-22 LAB — DIFFERENTIAL
Basophils Relative: 0 % (ref 0–1)
Eosinophils Absolute: 0.4 10*3/uL (ref 0.0–0.7)
Monocytes Relative: 11 % (ref 3–12)
Neutrophils Relative %: 71 % (ref 43–77)

## 2010-10-22 LAB — CBC
MCH: 28.8 pg (ref 26.0–34.0)
Platelets: 130 10*3/uL — ABNORMAL LOW (ref 150–400)
RBC: 2.99 MIL/uL — ABNORMAL LOW (ref 3.87–5.11)
WBC: 8.2 10*3/uL (ref 4.0–10.5)

## 2010-10-23 ENCOUNTER — Emergency Department (HOSPITAL_BASED_OUTPATIENT_CLINIC_OR_DEPARTMENT_OTHER)
Admission: EM | Admit: 2010-10-23 | Discharge: 2010-10-23 | Disposition: A | Discharge status: Home / Self Care | Payer: Medicare Other | Attending: Emergency Medicine | Admitting: Emergency Medicine

## 2010-10-23 ENCOUNTER — Emergency Department (INDEPENDENT_AMBULATORY_CARE_PROVIDER_SITE_OTHER): Payer: Medicare Other

## 2010-10-23 DIAGNOSIS — S0083XA Contusion of other part of head, initial encounter: Secondary | ICD-10-CM | POA: Insufficient documentation

## 2010-10-23 DIAGNOSIS — M25519 Pain in unspecified shoulder: Secondary | ICD-10-CM | POA: Insufficient documentation

## 2010-10-23 DIAGNOSIS — S0003XA Contusion of scalp, initial encounter: Secondary | ICD-10-CM

## 2010-10-23 DIAGNOSIS — W19XXXA Unspecified fall, initial encounter: Secondary | ICD-10-CM

## 2010-10-23 DIAGNOSIS — S1093XA Contusion of unspecified part of neck, initial encounter: Secondary | ICD-10-CM

## 2010-10-23 DIAGNOSIS — E119 Type 2 diabetes mellitus without complications: Secondary | ICD-10-CM | POA: Insufficient documentation

## 2010-10-23 DIAGNOSIS — E7800 Pure hypercholesterolemia, unspecified: Secondary | ICD-10-CM | POA: Insufficient documentation

## 2010-10-23 DIAGNOSIS — I10 Essential (primary) hypertension: Secondary | ICD-10-CM | POA: Insufficient documentation

## 2010-10-23 DIAGNOSIS — E039 Hypothyroidism, unspecified: Secondary | ICD-10-CM | POA: Insufficient documentation

## 2010-10-23 DIAGNOSIS — Z79899 Other long term (current) drug therapy: Secondary | ICD-10-CM | POA: Insufficient documentation

## 2010-10-26 ENCOUNTER — Ambulatory Visit (HOSPITAL_BASED_OUTPATIENT_CLINIC_OR_DEPARTMENT_OTHER): Admission: RE | Admit: 2010-10-26 | Payer: Medicare Other | Source: Ambulatory Visit | Admitting: Urology

## 2010-11-10 ENCOUNTER — Encounter (HOSPITAL_BASED_OUTPATIENT_CLINIC_OR_DEPARTMENT_OTHER): Payer: Medicare Other | Admitting: Oncology

## 2010-11-10 ENCOUNTER — Other Ambulatory Visit: Payer: Self-pay | Admitting: Oncology

## 2010-11-10 DIAGNOSIS — E039 Hypothyroidism, unspecified: Secondary | ICD-10-CM

## 2010-11-10 DIAGNOSIS — D649 Anemia, unspecified: Secondary | ICD-10-CM

## 2010-11-10 DIAGNOSIS — D509 Iron deficiency anemia, unspecified: Secondary | ICD-10-CM

## 2010-11-10 DIAGNOSIS — D696 Thrombocytopenia, unspecified: Secondary | ICD-10-CM

## 2010-11-10 LAB — CBC WITH DIFFERENTIAL/PLATELET
Eosinophils Absolute: 0.2 10*3/uL (ref 0.0–0.5)
LYMPH%: 13.7 % — ABNORMAL LOW (ref 14.0–49.7)
MONO#: 0.7 10*3/uL (ref 0.1–0.9)
NEUT#: 7.5 10*3/uL — ABNORMAL HIGH (ref 1.5–6.5)
Platelets: 147 10*3/uL (ref 145–400)
RBC: 3.06 10*6/uL — ABNORMAL LOW (ref 3.70–5.45)
RDW: 18.1 % — ABNORMAL HIGH (ref 11.2–14.5)
WBC: 9.8 10*3/uL (ref 3.9–10.3)
lymph#: 1.3 10*3/uL (ref 0.9–3.3)

## 2010-11-19 ENCOUNTER — Ambulatory Visit: Payer: Medicare Other

## 2010-11-23 ENCOUNTER — Other Ambulatory Visit: Payer: Self-pay | Admitting: Urology

## 2010-11-23 ENCOUNTER — Ambulatory Visit (HOSPITAL_BASED_OUTPATIENT_CLINIC_OR_DEPARTMENT_OTHER)
Admission: RE | Admit: 2010-11-23 | Discharge: 2010-11-23 | Disposition: A | Discharge status: Home / Self Care | Payer: Medicare Other | Source: Ambulatory Visit | Attending: Urology | Admitting: Urology

## 2010-11-23 DIAGNOSIS — N329 Bladder disorder, unspecified: Secondary | ICD-10-CM | POA: Insufficient documentation

## 2010-11-23 DIAGNOSIS — Z9861 Coronary angioplasty status: Secondary | ICD-10-CM | POA: Insufficient documentation

## 2010-11-23 DIAGNOSIS — Z8551 Personal history of malignant neoplasm of bladder: Secondary | ICD-10-CM | POA: Insufficient documentation

## 2010-11-23 DIAGNOSIS — E119 Type 2 diabetes mellitus without complications: Secondary | ICD-10-CM | POA: Insufficient documentation

## 2010-11-23 DIAGNOSIS — Z950 Presence of cardiac pacemaker: Secondary | ICD-10-CM | POA: Insufficient documentation

## 2010-11-23 DIAGNOSIS — I10 Essential (primary) hypertension: Secondary | ICD-10-CM | POA: Insufficient documentation

## 2010-11-23 DIAGNOSIS — Z01812 Encounter for preprocedural laboratory examination: Secondary | ICD-10-CM | POA: Insufficient documentation

## 2010-11-23 DIAGNOSIS — I2510 Atherosclerotic heart disease of native coronary artery without angina pectoris: Secondary | ICD-10-CM | POA: Insufficient documentation

## 2010-11-23 LAB — POCT I-STAT 4, (NA,K, GLUC, HGB,HCT)
Glucose, Bld: 133 mg/dL — ABNORMAL HIGH (ref 70–99)
HCT: 31 % — ABNORMAL LOW (ref 36.0–46.0)
Hemoglobin: 10.5 g/dL — ABNORMAL LOW (ref 12.0–15.0)
Potassium: 4.1 mEq/L (ref 3.5–5.1)
Sodium: 138 mEq/L (ref 135–145)

## 2010-11-26 NOTE — Op Note (Signed)
  NAMEENORA, TRILLO NO.:  [id number]  MEDICAL RECORD NO.:  [id number]  LOCATION:                                 FACILITY:  PHYSICIAN:  Valetta Fuller, M.D.  DATE OF BIRTH:  [birth year]  DATE OF PROCEDURE:  11/23/2010 DATE OF DISCHARGE:                              OPERATIVE REPORT   PREOPERATIVE DIAGNOSIS:  History of bladder cancer.  POSTOPERATIVE DIAGNOSIS:  History of bladder cancer.  PROCEDURE PERFORMED:  Cystoscopy, bilateral retrograde pyelography and bladder biopsy with fulguration.  SURGEON:  Valetta Fuller, MD  ANESTHESIA:  General.  INDICATIONS:  Ms. Erck is 77 years of age.  The patient previously was seen by Dr. 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  The patient did have multifocal tumors that apparently showed no definitive evidence of invasion, although apparently superficial invasion was suspected at one point.  The patient did receive BCG therapy.  The patient subsequently had been under the care of Dr. Alfredo Martinez.  Recently on cystoscopy, she was felt to have some increased erythema on the wall of her bladder posteriorly and the concern was over possible carcinoma in situ.  She was sent to see me for assessment.  The patient underwent assessment in our office.  We discussed her situation and felt that it would be prudent to re-evaluate her urothelium with cystoscopy under anesthesia, possible biopsy, bladder washings for cytology and retrograde pyelograms.  Full informed consent was obtained.  TECHNIQUE AND FINDINGS:  The patient was brought to the operating room where she had successful induction of general anesthesia.  She received perioperative ciprofloxacin and was placed in lithotomy position and prepped and draped in usual manner.  Cystoscopy revealed unremarkable urethra.  On the left posterior wall of her bladder, there was an area of some slight  increased erythema.  This appeared to be inflammatory but carcinoma in situ cannot be completely ruled out.  Retrograde pyelograms were done with open-end catheters bilaterally. Fluoroscopic interpretation of the entire collecting system bilaterally was performed and felt to be normal without filling defects or obstruction.  Saline barbotage of the bladder was done and sent for cytologic analysis.  The posterior wall of her bladder was then biopsied with two small cold cup biopsies and these areas were then fulgurated with a Bugbee electrode.  The patient had no obvious complications and was brought to recovery room in stable condition.     Valetta Fuller, M.D.     DSG/MEDQ  D:  11/24/2010  T:  11/24/2010  Job:  [id number]  Electronically Signed by Barron Alvine M.D. on 11/26/2010 05:45:11 PM

## 2010-12-01 ENCOUNTER — Other Ambulatory Visit: Payer: Self-pay | Admitting: Oncology

## 2010-12-01 ENCOUNTER — Encounter (HOSPITAL_BASED_OUTPATIENT_CLINIC_OR_DEPARTMENT_OTHER): Payer: Medicare Other | Admitting: Oncology

## 2010-12-01 DIAGNOSIS — D649 Anemia, unspecified: Secondary | ICD-10-CM

## 2010-12-01 DIAGNOSIS — E039 Hypothyroidism, unspecified: Secondary | ICD-10-CM

## 2010-12-01 DIAGNOSIS — D469 Myelodysplastic syndrome, unspecified: Secondary | ICD-10-CM

## 2010-12-01 DIAGNOSIS — D696 Thrombocytopenia, unspecified: Secondary | ICD-10-CM

## 2010-12-01 LAB — CBC WITH DIFFERENTIAL/PLATELET
Basophils Absolute: 0 10*3/uL (ref 0.0–0.1)
Eosinophils Absolute: 0.3 10*3/uL (ref 0.0–0.5)
HGB: 9.5 g/dL — ABNORMAL LOW (ref 11.6–15.9)
MCV: 91.4 fL (ref 79.5–101.0)
MONO#: 0.9 10*3/uL (ref 0.1–0.9)
MONO%: 9.6 % (ref 0.0–14.0)
NEUT#: 6.4 10*3/uL (ref 1.5–6.5)
RBC: 3.2 10*6/uL — ABNORMAL LOW (ref 3.70–5.45)
RDW: 18.6 % — ABNORMAL HIGH (ref 11.2–14.5)
WBC: 8.9 10*3/uL (ref 3.9–10.3)
lymph#: 1.4 10*3/uL (ref 0.9–3.3)

## 2010-12-11 ENCOUNTER — Encounter: Payer: Self-pay | Admitting: Internal Medicine

## 2010-12-22 ENCOUNTER — Other Ambulatory Visit: Payer: Self-pay | Admitting: Oncology

## 2010-12-22 ENCOUNTER — Encounter (HOSPITAL_BASED_OUTPATIENT_CLINIC_OR_DEPARTMENT_OTHER): Payer: Medicare Other | Admitting: Oncology

## 2010-12-22 DIAGNOSIS — D469 Myelodysplastic syndrome, unspecified: Secondary | ICD-10-CM

## 2010-12-22 DIAGNOSIS — D649 Anemia, unspecified: Secondary | ICD-10-CM

## 2010-12-22 DIAGNOSIS — D696 Thrombocytopenia, unspecified: Secondary | ICD-10-CM

## 2010-12-22 DIAGNOSIS — E039 Hypothyroidism, unspecified: Secondary | ICD-10-CM

## 2010-12-22 LAB — CBC WITH DIFFERENTIAL/PLATELET
Basophils Absolute: 0 10*3/uL (ref 0.0–0.1)
Eosinophils Absolute: 0.4 10*3/uL (ref 0.0–0.5)
HCT: 29.3 % — ABNORMAL LOW (ref 34.8–46.6)
HGB: 8.8 g/dL — ABNORMAL LOW (ref 11.6–15.9)
LYMPH%: 19.1 % (ref 14.0–49.7)
MCV: 94.5 fL (ref 79.5–101.0)
MONO#: 0.8 10*3/uL (ref 0.1–0.9)
MONO%: 10.2 % (ref 0.0–14.0)
NEUT#: 5.4 10*3/uL (ref 1.5–6.5)
Platelets: 141 10*3/uL — ABNORMAL LOW (ref 145–400)
RBC: 3.1 10*6/uL — ABNORMAL LOW (ref 3.70–5.45)
WBC: 8.2 10*3/uL (ref 3.9–10.3)
nRBC: 0 % (ref 0–0)

## 2011-01-11 ENCOUNTER — Emergency Department (HOSPITAL_COMMUNITY): Payer: Medicare Other

## 2011-01-11 ENCOUNTER — Telehealth: Payer: Self-pay | Admitting: Internal Medicine

## 2011-01-11 ENCOUNTER — Inpatient Hospital Stay (HOSPITAL_COMMUNITY)
Admission: EM | Admit: 2011-01-11 | Discharge: 2011-01-13 | DRG: 313 | Disposition: A | Discharge status: Home / Self Care | Payer: Medicare Other | Attending: Cardiology | Admitting: Cardiology

## 2011-01-11 DIAGNOSIS — R0789 Other chest pain: Principal | ICD-10-CM | POA: Diagnosis present

## 2011-01-11 DIAGNOSIS — R0602 Shortness of breath: Secondary | ICD-10-CM

## 2011-01-11 DIAGNOSIS — E039 Hypothyroidism, unspecified: Secondary | ICD-10-CM | POA: Diagnosis present

## 2011-01-11 DIAGNOSIS — D649 Anemia, unspecified: Secondary | ICD-10-CM | POA: Diagnosis present

## 2011-01-11 DIAGNOSIS — Z8551 Personal history of malignant neoplasm of bladder: Secondary | ICD-10-CM

## 2011-01-11 DIAGNOSIS — D469 Myelodysplastic syndrome, unspecified: Secondary | ICD-10-CM | POA: Diagnosis present

## 2011-01-11 DIAGNOSIS — N183 Chronic kidney disease, stage 3 unspecified: Secondary | ICD-10-CM | POA: Diagnosis present

## 2011-01-11 DIAGNOSIS — Z7982 Long term (current) use of aspirin: Secondary | ICD-10-CM

## 2011-01-11 DIAGNOSIS — R079 Chest pain, unspecified: Secondary | ICD-10-CM

## 2011-01-11 DIAGNOSIS — I2510 Atherosclerotic heart disease of native coronary artery without angina pectoris: Secondary | ICD-10-CM | POA: Diagnosis present

## 2011-01-11 DIAGNOSIS — I129 Hypertensive chronic kidney disease with stage 1 through stage 4 chronic kidney disease, or unspecified chronic kidney disease: Secondary | ICD-10-CM | POA: Diagnosis present

## 2011-01-11 DIAGNOSIS — Z950 Presence of cardiac pacemaker: Secondary | ICD-10-CM

## 2011-01-11 DIAGNOSIS — I498 Other specified cardiac arrhythmias: Secondary | ICD-10-CM | POA: Diagnosis present

## 2011-01-11 DIAGNOSIS — E119 Type 2 diabetes mellitus without complications: Secondary | ICD-10-CM | POA: Diagnosis present

## 2011-01-11 LAB — URINALYSIS, ROUTINE W REFLEX MICROSCOPIC
Glucose, UA: NEGATIVE mg/dL
Specific Gravity, Urine: 1.015 (ref 1.005–1.030)
pH: 5.5 (ref 5.0–8.0)

## 2011-01-11 LAB — DIFFERENTIAL
Basophils Absolute: 0 10*3/uL (ref 0.0–0.1)
Basophils Relative: 0 % (ref 0–1)
Lymphocytes Relative: 15 % (ref 12–46)
Monocytes Absolute: 0.8 10*3/uL (ref 0.1–1.0)
Monocytes Relative: 9 % (ref 3–12)
Neutro Abs: 6.2 10*3/uL (ref 1.7–7.7)
Neutrophils Relative %: 70 % (ref 43–77)

## 2011-01-11 LAB — MRSA PCR SCREENING: MRSA by PCR: NEGATIVE

## 2011-01-11 LAB — BASIC METABOLIC PANEL
CO2: 30 mEq/L (ref 19–32)
Chloride: 99 mEq/L (ref 96–112)
GFR calc Af Amer: 49 mL/min — ABNORMAL LOW (ref >60)
Potassium: 3.6 mEq/L (ref 3.5–5.1)

## 2011-01-11 LAB — CBC
HCT: 29.2 % — ABNORMAL LOW (ref 36.0–46.0)
Hemoglobin: 8.9 g/dL — ABNORMAL LOW (ref 12.0–15.0)
MCH: 28 pg (ref 26.0–34.0)
RBC: 3.18 MIL/uL — ABNORMAL LOW (ref 3.87–5.11)

## 2011-01-11 LAB — HEPATIC FUNCTION PANEL
ALT: 16 U/L (ref 0–35)
AST: 20 U/L (ref 0–37)
Alkaline Phosphatase: 30 U/L — ABNORMAL LOW (ref 39–117)
Total Protein: 6.3 g/dL (ref 6.0–8.3)

## 2011-01-11 LAB — GLUCOSE, CAPILLARY: Glucose-Capillary: 165 mg/dL — ABNORMAL HIGH (ref 70–99)

## 2011-01-11 LAB — TROPONIN I: Troponin I: <0.3 ng/mL (ref <0.30)

## 2011-01-11 LAB — CK TOTAL AND CKMB (NOT AT ARMC): Relative Index: INVALID (ref 0.0–2.5)

## 2011-01-11 LAB — PROTIME-INR
INR: 0.91 (ref 0.00–1.49)
Prothrombin Time: 12.4 seconds (ref 11.6–15.2)

## 2011-01-11 LAB — TSH: TSH: 0.267 u[IU]/mL — ABNORMAL LOW (ref 0.350–4.500)

## 2011-01-11 LAB — URINE MICROSCOPIC-ADD ON

## 2011-01-11 NOTE — Telephone Encounter (Signed)
PT calling stating left arm pain woke her in middle of nite, also had CP--"something sitting on my chest" and SOB--took 2 arthritis tylenol with some  Relief--no N and V--No diaphoresis--advised pt to come to [place] --pt states appoint with dr Graciela Husbands 8/28, but advised to go to ED ANYWAY--PT AGREES--NT

## 2011-01-11 NOTE — Telephone Encounter (Signed)
Pt calling c/o tightness in chest and a little sob. Pt said she is not too alarmed by it. Pt said she does have appt w/ Dr. Graciela Husbands tomorrow just wanted to inform Dr. Odessa Fleming nurse of her condition in case nurse wants pt to come in today. Please return pt call to advise/discuss.

## 2011-01-12 ENCOUNTER — Inpatient Hospital Stay (HOSPITAL_COMMUNITY): Payer: Medicare Other

## 2011-01-12 ENCOUNTER — Encounter: Payer: Medicare Other | Admitting: Internal Medicine

## 2011-01-12 DIAGNOSIS — I2510 Atherosclerotic heart disease of native coronary artery without angina pectoris: Secondary | ICD-10-CM

## 2011-01-12 DIAGNOSIS — I059 Rheumatic mitral valve disease, unspecified: Secondary | ICD-10-CM

## 2011-01-12 LAB — CBC
HCT: 24.2 % — ABNORMAL LOW (ref 36.0–46.0)
Hemoglobin: 8 g/dL — ABNORMAL LOW (ref 12.0–15.0)
MCH: 27.8 pg (ref 26.0–34.0)
MCH: 28.7 pg (ref 26.0–34.0)
MCHC: 30.2 g/dL (ref 30.0–36.0)
MCV: 92 fL (ref 78.0–100.0)
RBC: 2.79 MIL/uL — ABNORMAL LOW (ref 3.87–5.11)
RDW: 17.3 % — ABNORMAL HIGH (ref 11.5–15.5)
WBC: 7.7 10*3/uL (ref 4.0–10.5)

## 2011-01-12 LAB — CARDIAC PANEL(CRET KIN+CKTOT+MB+TROPI)
Relative Index: INVALID (ref 0.0–2.5)
Troponin I: <0.3 ng/mL (ref <0.30)

## 2011-01-12 LAB — DIFFERENTIAL
Eosinophils Relative: 6 % — ABNORMAL HIGH (ref 0–5)
Lymphocytes Relative: 19 % (ref 12–46)
Lymphs Abs: 1.5 10*3/uL (ref 0.7–4.0)
Monocytes Absolute: 0.9 10*3/uL (ref 0.1–1.0)
Monocytes Relative: 12 % (ref 3–12)

## 2011-01-12 LAB — BASIC METABOLIC PANEL
BUN: 26 mg/dL — ABNORMAL HIGH (ref 6–23)
Chloride: 102 mEq/L (ref 96–112)
Creatinine, Ser: 1.39 mg/dL — ABNORMAL HIGH (ref 0.50–1.10)
GFR calc Af Amer: 44 mL/min — ABNORMAL LOW (ref >60)

## 2011-01-12 LAB — GLUCOSE, CAPILLARY: Glucose-Capillary: 128 mg/dL — ABNORMAL HIGH (ref 70–99)

## 2011-01-12 LAB — URINE CULTURE
Colony Count: NO GROWTH
Culture  Setup Time: 201208271142

## 2011-01-12 NOTE — H&P (Addendum)
[name], [name] NO.:  [id number]  MEDICAL RECORD NO.:  [id number]  LOCATION:  3730                         FACILITY:  [facility]  PHYSICIAN:  Peter M. Swaziland, M.D.  DATE OF BIRTH:  [birth year]  DATE OF ADMISSION:  01/11/2011 DATE OF DISCHARGE:                             HISTORY & PHYSICAL   PRIMARY CARDIOLOGIST:  Duke Salvia, MD, [facility]  PRIMARY MEDICAL DOCTOR:  Dr. Janna Arch, MD  CHIEF COMPLAINT:  Chest pressure and shortness of breath.  HISTORY OF PRESENT ILLNESS:  Ms. [name] is an 77 year old pleasant lady with a history of coronary artery disease status post drug-eluting stent placement to the diagonal in 2009, bradycardia status post pacemaker implantation, chronic kidney disease, myelodysplastic syndrome who presented to the [facility] with complaints of chest pressure and shortness of breath.  She woke up approximately 3 o'clock in the morning with a sensation of left arm pain radiating to her neck associated with shortness of breath and pressure on her chest, just someone standing on it.  She got up, took 2 arthritis pills with no relief.  The pain resolved spontaneously, then came back, she became particularly concern about the neck discomfort, so called our office and was instructed to proceed to the ER for further evaluation.  She still had chest pressure coming into the emergency room which was relieved with nitroglycerin and aspirin.  With her 2009 stent, she only had shortness of breath and no discomfort.  EKG is V-paced and initial troponin is negative x1.  She has a decreased hemoglobin of 8.9 and creatinine of 1.6 which are in line with previously known values.  Her daughter is convinced that her mother's symptoms are related to [place] and [place] fries that she ate the night prior.  PAST MEDICAL HISTORY: 1. Coronary artery disease status post drug-eluting stent placement to     the ostial diagonal in June 2009. 2.  Myelodysplastic syndrome.     a.     Followed by Dr. Cyndie Chime.     b.     Hemoglobin of 8.21 October 2010. 3. Chronic renal insufficiency with creatinine of 1.40 in June 2012. 4. Hypothyroidism. 5. Hypertension. 6. Hyperlipidemia. 7. PAD with 79% RCA stenosis in 2009. 8. 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 9. Diabetes mellitus. 10.GERD and gastric erosion, EGD in 2009. 11.Bladder cancer treated in 2003. 12.History of orthostatic hypotension. 13.Status post cholecystectomy. 14.Status post appendectomy. 15.Status post hysterectomy.  MEDICATIONS: 1. Aranesp every 3 weeks. 2. HCTZ 12.5 mg daily. 3. Paxil 10 mg daily. 4. Metformin 1000 mg b.i.d. 5. Aspirin 325 mg daily. 6. Crestor 20 mg daily. 7. Levothyroxine 112 mcg daily. 8. Tylenol 500 mg 2 tablets p.r.n.  ALLERGIES:  CODEINE, PENICILLIN, and SULFA.  SOCIAL HISTORY:  Ms. [name] lives in [place].  She is accompanied by her daughter today.  She denies any tobacco or alcohol use.  FAMILY HISTORY:  Her mother died of pneumonia.  Father died of old age. She has siblings with lung cancer.  REVIEW OF SYSTEMS:  No fevers.  The patient felt an occasional chilliness,  states she is cold natured but denies any overt rigors.  No nausea, vomiting, or diarrhea.  No lower extremity edema or syncope.  No orthopnea.  All other systems reviewed and otherwise negative.  LABORATORY FINDINGS:  WBC 8.8, hemoglobin 8.9, hematocrit 29.2, platelet count 143.  Sodium 138, potassium 2.6, chloride 99, CO2 30, glucose 140, BUN 23, creatinine 1.26.  Cardiac enzymes negative x1.  UA is small blood with small leukocytes.  EKG V-paced, 66 beats per minutes, otherwise no acute changes.  RADIOLOGIC STUDIES:  Chest x-ray showed possible mild pulmonary edema versus interval worsening and pulmonary fibrosis.Marland Kitchen  PHYSICAL EXAMINATION:  VITAL SIGNS:  Temperature 98.1, pulse  56, respirations 15, blood pressure 124/60, pulse ox 100% on room air. GENERAL:  This is a pleasant, comfortable-appearing white female in no acute distress, lying flat in bed. HEENT:  Normocephalic, atraumatic.  Extraocular movements intact.  Clear sclerae.  Nares are without discharge. NECK:  Supple without carotid bruits. CARDIAC:  Auscultation to the heart shows regular rate and rhythm with S1 and S2 without murmurs, rubs or gallops. LUNGS:  Crackles bilaterally half way up, reminiscent of those with pulmonary fibrosis.  SKIN:  Skin on her neck is thickened. ABDOMEN:  Soft, nontender, nondistended, positive bowel sounds.  No rebound or guarding. EXTREMITIES:  Warm, dry without edema.  She has 2+ pedal pulses bilaterally. NEURO:  She is alert and oriented x3, responds to questions appropriately with a normal affect.  ASSESSMENT AND PLAN:  The patient was seen and examined by Dr. Swaziland and myself.  This is an 77 year old lady with a history of coronary artery disease, chronic renal insufficiency, myelodysplastic syndrome, pacemaker implantation who presents with an episode of chest discomfort waking her from sleep associated with shortness of breath.  These symptoms are somewhat worrisome for unstable angina.  However given her creatinine and myelodysplastic syndrome, she is a less than ideal candidate for catheterization.  In light of these comorbidities, we will plan to admit the patient and rule her out by cycling cardiac enzymes. We will keep her n.p.o. after midnight, plan for Lexiscan Myoview in the morning.  We will hold off on heparin given her chronically low hemoglobin.  We would consider the addition of Imdur.  We will hold off on beta-blockade given question of pulmonary fibrosis, of which the patient denies any known history that is evidenced by her chest x-ray.  Her other home medications will be continued with the exception of metformin and HCTZ, in the event that  she does rule in.  At that point the risks versus benefits of the catheterization would have to be discussed with the patient and her family.     Ronie Spies, P.A.C.   ______________________________ Peter M. Swaziland, M.D.    DD/MEDQ  D:  01/11/2011  T:  01/12/2011  Job:  [id number]  cc:   Duke Salvia, MD, [facility] Richard Roylene Reason, MD  Electronically Signed by Ronie Spies  on 01/12/2011 91:47:82 PM Electronically Signed by PETER Swaziland M.D. on 01/21/2011 06:46:21 PM

## 2011-01-13 LAB — GLUCOSE, CAPILLARY: Glucose-Capillary: 147 mg/dL — ABNORMAL HIGH (ref 70–99)

## 2011-01-13 LAB — BASIC METABOLIC PANEL
CO2: 31 mEq/L (ref 19–32)
GFR calc non Af Amer: 39 mL/min — ABNORMAL LOW (ref >60)
Glucose, Bld: 146 mg/dL — ABNORMAL HIGH (ref 70–99)
Potassium: 4 mEq/L (ref 3.5–5.1)
Sodium: 141 mEq/L (ref 135–145)

## 2011-01-13 LAB — CBC
Hemoglobin: 10.8 g/dL — ABNORMAL LOW (ref 12.0–15.0)
RBC: 3.78 MIL/uL — ABNORMAL LOW (ref 3.87–5.11)

## 2011-01-13 MED ORDER — TECHNETIUM TC 99M TETROFOSMIN IV KIT
10.0000 | PACK | Freq: Once | INTRAVENOUS | Status: AC | PRN
  Administered 2011-01-13: 10 via INTRAVENOUS

## 2011-01-13 MED ORDER — TECHNETIUM TC 99M TETROFOSMIN IV KIT
30.0000 | PACK | Freq: Once | INTRAVENOUS | Status: AC | PRN
  Administered 2011-01-13: 30 via INTRAVENOUS

## 2011-01-14 LAB — CROSSMATCH: Unit division: 0

## 2011-01-14 LAB — GLUCOSE, CAPILLARY: Glucose-Capillary: 155 mg/dL — ABNORMAL HIGH (ref 70–99)

## 2011-02-03 NOTE — Discharge Summary (Signed)
NAMEPRISCILA, BEAN NO.:  [id number]  MEDICAL RECORD NO.:  [id number]  LOCATION:  3730                         FACILITY:  [facility]  PHYSICIAN:  Bevelyn Buckles. Grason Brailsford, MDDATE OF BIRTH:  [birth year]  DATE OF ADMISSION:  01/11/2011 DATE OF DISCHARGE:  01/13/2011                              DISCHARGE SUMMARY   PROCEDURES: 1. Lexiscan Myoview. 2. Two-view chest x-ray. 3. Two-D echocardiogram.  PRIMARY FINAL DISCHARGE DIAGNOSIS:  Chest pain.  SECONDARY DIAGNOSES: 1. History of myelodysplasia. 2. 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 3. Status post cardiac catheterization in June 2009 with drug-eluting     stent to the ostium of the diagonal. 4. History of symptomatic bradycardia with first-degree heart block     and Mobitz II second-degree arteriovenous block, status post     Medtronic Versa dual lead permanent pacemaker. 5. Chronic kidney disease, stage III. 6. Diabetes. 7. Hypothyroidism. 8. Hypertension. 9. Hyperlipidemia. 10.Gastroesophageal reflux disease and gastric erosions seen on     esophagogastroduodenoscopy. 11.History of bladder cancer. 12.Status post cholecystectomy, appendectomy, and hysterectomy.  TIME AT DISCHARGE:  38 minutes.  HOSPITAL COURSE:  Ms. [name] is an 77 year old female with no previous history of coronary artery disease.  She has been hospitalized before for chest pain, but has not been cathed since 2009.  She had chest pain and came to the hospital where she was admitted for further evaluation and treatment.  Her cardiac enzymes were negative for MI.  She had anemia with a hemoglobin of 7.3 and was transfused 2 units.  Her hemoglobin was 10.8 with hematocrit of 33.3 at discharge and she is to follow up with Dr. Cyndie Chime.  She has chronic kidney disease stage III and her BUN and creatinine are 24/1.29 at discharge.  Her TSH is slightly low at 0.267 and this is to be followed as  well.  There was concern for UTI and a urinalysis showed a small amount of blood and leukocytes, but her urine culture was negative.  A chest x-ray showed pulmonary fibrosis versus mild pulmonary edema.  Since her cardiac enzymes were negative, Ms. [name] had a Lexiscan Myoview done on January 13, 2011.  She is otherwise doing well and if the Myoview was negative she is considered stable for discharge, to follow up as an outpatient.  DISCHARGE INSTRUCTIONS: 1. Her activity level is to be increased gradually. 2. She is encouraged to stick to a low-sodium, heart-healthy, diabetic     diet. 3. She is to follow up with Dr. Graciela Husbands and an appointment will be     arranged. 4. She is to follow up with Dr. Janna Arch as needed.  DISCHARGE MEDICATIONS: 1. Tylenol 500 mg 2 tablets daily p.r.n. 2. Paxil 10 mg a day. 3. Metformin 1000 mg b.i.d. 4. Crestor 20 mg a day. 5. Aspirin 325 mg a day. 6. Hydrochlorothiazide 12.5 mg a day. 7. Levothroid 112 mcg daily. 8. Aranesp every 3 weeks. 9. Sublingual nitroglycerin p.r.n.Theodore Demark, PA-C   ______________________________ Bevelyn Buckles. Slevin Gunby, MD    RB/MEDQ  D:  01/13/2011  T:  01/13/2011  Job:  [id number]  cc:  Melvyn Novas, MD  Electronically Signed by Theodore Demark PA-C on 01/21/2011 03:16:50 PM Electronically Signed by Arvilla Meres MD on 02/03/2011 10:37:26 PM

## 2011-02-05 LAB — CBC
HCT: 32.7 — ABNORMAL LOW
Hemoglobin: 11 — ABNORMAL LOW
MCHC: 33.6
MCV: 100.7 — ABNORMAL HIGH
Platelets: 177
RBC: 3.24 — ABNORMAL LOW
RDW: 14
WBC: 9.1

## 2011-02-05 LAB — I-STAT 8, (EC8 V) (CONVERTED LAB)
Acid-Base Excess: 2
BUN: 28 — ABNORMAL HIGH
Bicarbonate: 25.1 — ABNORMAL HIGH
Chloride: 108
Glucose, Bld: 137 — ABNORMAL HIGH
HCT: 35 — ABNORMAL LOW
Hemoglobin: 11.9 — ABNORMAL LOW
Operator id: 265201
Potassium: 4.3
Sodium: 139
TCO2: 26
pCO2, Ven: 31.6 — ABNORMAL LOW
pH, Ven: 7.508 — ABNORMAL HIGH

## 2011-02-05 LAB — DIFFERENTIAL
Basophils Absolute: 0
Eosinophils Relative: 6 — ABNORMAL HIGH
Lymphocytes Relative: 22
Lymphs Abs: 2
Neutro Abs: 5.8

## 2011-02-05 LAB — POCT I-STAT CREATININE
Creatinine, Ser: 1.4 — ABNORMAL HIGH
Operator id: 265201

## 2011-02-08 ENCOUNTER — Encounter (HOSPITAL_BASED_OUTPATIENT_CLINIC_OR_DEPARTMENT_OTHER): Payer: Medicare Other | Admitting: Oncology

## 2011-02-08 ENCOUNTER — Other Ambulatory Visit: Payer: Self-pay | Admitting: Oncology

## 2011-02-08 DIAGNOSIS — D649 Anemia, unspecified: Secondary | ICD-10-CM

## 2011-02-08 DIAGNOSIS — D696 Thrombocytopenia, unspecified: Secondary | ICD-10-CM

## 2011-02-08 DIAGNOSIS — Z23 Encounter for immunization: Secondary | ICD-10-CM

## 2011-02-08 DIAGNOSIS — D469 Myelodysplastic syndrome, unspecified: Secondary | ICD-10-CM

## 2011-02-08 DIAGNOSIS — E039 Hypothyroidism, unspecified: Secondary | ICD-10-CM

## 2011-02-08 DIAGNOSIS — N289 Disorder of kidney and ureter, unspecified: Secondary | ICD-10-CM

## 2011-02-08 LAB — CBC & DIFF AND RETIC
Basophils Absolute: 0 10*3/uL (ref 0.0–0.1)
Eosinophils Absolute: 0.7 10*3/uL — ABNORMAL HIGH (ref 0.0–0.5)
HGB: 11.4 g/dL — ABNORMAL LOW (ref 11.6–15.9)
Immature Retic Fract: 15.6 % — ABNORMAL HIGH (ref 1.60–10.00)
MCV: 90 fL (ref 79.5–101.0)
MONO#: 1.1 10*3/uL — ABNORMAL HIGH (ref 0.1–0.9)
MONO%: 10.7 % (ref 0.0–14.0)
NEUT#: 6.7 10*3/uL — ABNORMAL HIGH (ref 1.5–6.5)
Platelets: 114 10*3/uL — ABNORMAL LOW (ref 145–400)
RDW: 17 % — ABNORMAL HIGH (ref 11.2–14.5)
Retic Ct Abs: 53.04 10*3/uL (ref 33.70–90.70)
WBC: 10 10*3/uL (ref 3.9–10.3)

## 2011-02-08 LAB — COMPREHENSIVE METABOLIC PANEL
Albumin: 3.9 g/dL (ref 3.5–5.2)
Alkaline Phosphatase: 33 U/L — ABNORMAL LOW (ref 39–117)
BUN: 24 mg/dL — ABNORMAL HIGH (ref 6–23)
Calcium: 10.5 mg/dL (ref 8.4–10.5)
Chloride: 99 mEq/L (ref 96–112)
Glucose, Bld: 158 mg/dL — ABNORMAL HIGH (ref 70–99)
Potassium: 3.3 mEq/L — ABNORMAL LOW (ref 3.5–5.3)
Sodium: 140 mEq/L (ref 135–145)
Total Protein: 7.4 g/dL (ref 6.0–8.3)

## 2011-02-08 LAB — MORPHOLOGY

## 2011-02-11 LAB — BASIC METABOLIC PANEL
BUN: 18
BUN: 20
CO2: 24
CO2: 26
Calcium: 8.8
Calcium: 9.2
Calcium: 9.5
Creatinine, Ser: 1.25 — ABNORMAL HIGH
GFR calc Af Amer: 56 — ABNORMAL LOW
GFR calc non Af Amer: 42 — ABNORMAL LOW
GFR calc non Af Amer: 47 — ABNORMAL LOW
Glucose, Bld: 133 — ABNORMAL HIGH
Glucose, Bld: 203 — ABNORMAL HIGH
Sodium: 136
Sodium: 141

## 2011-02-11 LAB — CBC
HCT: 25.9 — ABNORMAL LOW
HCT: 27.5 — ABNORMAL LOW
Hemoglobin: 8.8 — ABNORMAL LOW
Hemoglobin: 8.9 — ABNORMAL LOW
Hemoglobin: 9.3 — ABNORMAL LOW
Hemoglobin: 9.5 — ABNORMAL LOW
MCHC: 34.1
MCHC: 34.3
MCHC: 35.2
MCV: 102.5 — ABNORMAL HIGH
MCV: 102.7 — ABNORMAL HIGH
Platelets: 106 — ABNORMAL LOW
Platelets: 114 — ABNORMAL LOW
Platelets: 96 — ABNORMAL LOW
RBC: 2.48 — ABNORMAL LOW
RBC: 2.52 — ABNORMAL LOW
RBC: 2.69 — ABNORMAL LOW
RBC: 2.69 — ABNORMAL LOW
RBC: 2.71 — ABNORMAL LOW
RDW: 13.7
RDW: 13.9
WBC: 7.4
WBC: 9.2

## 2011-02-11 LAB — CARDIAC PANEL(CRET KIN+CKTOT+MB+TROPI)
Relative Index: INVALID
Relative Index: INVALID
Relative Index: INVALID
Total CK: 56
Troponin I: 0.01

## 2011-02-11 LAB — COMPREHENSIVE METABOLIC PANEL
BUN: 19
CO2: 24
Calcium: 9.2
Chloride: 108
Creatinine, Ser: 1.18
GFR calc non Af Amer: 44 — ABNORMAL LOW
Total Bilirubin: 0.7

## 2011-02-11 LAB — HEMOGLOBIN A1C
Hgb A1c MFr Bld: 7 — ABNORMAL HIGH
Mean Plasma Glucose: 172

## 2011-02-11 LAB — PROTIME-INR
INR: 1
Prothrombin Time: 13.4

## 2011-02-11 LAB — RETICULOCYTES
RBC.: 3.3 — ABNORMAL LOW
Retic Count, Absolute: 69.3
Retic Ct Pct: 2.1

## 2011-02-11 LAB — POCT I-STAT, CHEM 8
Calcium, Ion: 1.24
Creatinine, Ser: 1.5 — ABNORMAL HIGH
Glucose, Bld: 103 — ABNORMAL HIGH
HCT: 31 — ABNORMAL LOW
Hemoglobin: 10.5 — ABNORMAL LOW

## 2011-02-11 LAB — MULTIPLE MYELOMA PANEL, SERUM
Alpha-2-Globulin: 16.1 — ABNORMAL HIGH
Beta Globulin: 6.3
Gamma Globulin: 11.5
IgM, Serum: 195
M-Spike, %: NOT DETECTED
Total Protein: 7.2

## 2011-02-11 LAB — IRON AND TIBC
Iron: 51
TIBC: 359
UIBC: 308

## 2011-02-11 LAB — FERRITIN: Ferritin: 72 (ref 10–291)

## 2011-02-11 LAB — HEPATIC FUNCTION PANEL
Albumin: 3.3 — ABNORMAL LOW
Total Protein: 6.2

## 2011-02-11 LAB — DIFFERENTIAL
Basophils Relative: 0
Lymphocytes Relative: 14
Monocytes Relative: 8
Neutro Abs: 7.2

## 2011-02-11 LAB — CROSSMATCH
ABO/RH(D): O POS
Antibody Screen: NEGATIVE

## 2011-02-11 LAB — LIPASE, BLOOD: Lipase: 34

## 2011-02-11 LAB — OCCULT BLOOD X 1 CARD TO LAB, STOOL: Fecal Occult Bld: NEGATIVE

## 2011-02-11 LAB — APTT: aPTT: 32

## 2011-02-11 LAB — VITAMIN B12: Vitamin B-12: 391 (ref 211–911)

## 2011-02-11 LAB — CHROMOSOME ANALYSIS, BONE MARROW

## 2011-02-11 LAB — ABO/RH: ABO/RH(D): O POS

## 2011-02-12 LAB — CARDIAC PANEL(CRET KIN+CKTOT+MB+TROPI)
CK, MB: 2.8
Relative Index: 3.6 — ABNORMAL HIGH
Total CK: 146
Troponin I: 0.03
Troponin I: <0.01

## 2011-02-12 LAB — URINALYSIS, ROUTINE W REFLEX MICROSCOPIC
Glucose, UA: NEGATIVE
Ketones, ur: NEGATIVE
Protein, ur: NEGATIVE
Specific Gravity, Urine: 1.016
Urobilinogen, UA: 0.2

## 2011-02-12 LAB — URINE MICROSCOPIC-ADD ON

## 2011-02-12 LAB — COMPREHENSIVE METABOLIC PANEL
ALT: 24
AST: 30
Alkaline Phosphatase: 34 — ABNORMAL LOW
CO2: 26
Calcium: 8.7
Chloride: 107
GFR calc non Af Amer: 44 — ABNORMAL LOW
Glucose, Bld: 142 — ABNORMAL HIGH
Potassium: 4.4
Sodium: 139
Total Bilirubin: 0.7

## 2011-02-12 LAB — LIPID PANEL
Total CHOL/HDL Ratio: 3.1
VLDL: 35

## 2011-02-12 LAB — RENAL FUNCTION PANEL
BUN: 17
Calcium: 8.4
Glucose, Bld: 107 — ABNORMAL HIGH
Phosphorus: 3.8

## 2011-02-12 LAB — POCT I-STAT, CHEM 8
Calcium, Ion: 1.2
Chloride: 106
Creatinine, Ser: 1.4 — ABNORMAL HIGH
Glucose, Bld: 135 — ABNORMAL HIGH
HCT: 27 — ABNORMAL LOW
Hemoglobin: 9.2 — ABNORMAL LOW

## 2011-02-12 LAB — URINE CULTURE: Culture: NO GROWTH

## 2011-02-12 LAB — CBC
MCHC: 33.8
MCV: 101.9 — ABNORMAL HIGH
Platelets: 113 — ABNORMAL LOW

## 2011-02-12 LAB — MAGNESIUM: Magnesium: 2

## 2011-02-12 LAB — PROTIME-INR: Prothrombin Time: 13.9

## 2011-02-12 LAB — SODIUM, URINE, RANDOM: Sodium, Ur: 147

## 2011-02-12 LAB — SAMPLE TO BLOOD BANK

## 2011-02-12 LAB — CK TOTAL AND CKMB (NOT AT ARMC): Total CK: 189 — ABNORMAL HIGH

## 2011-02-23 ENCOUNTER — Emergency Department (HOSPITAL_COMMUNITY): Payer: Medicare Other

## 2011-02-23 ENCOUNTER — Inpatient Hospital Stay (HOSPITAL_COMMUNITY)
Admission: EM | Admit: 2011-02-23 | Discharge: 2011-03-02 | DRG: 690 | Disposition: A | Discharge status: Skilled Nursing Facility | Payer: Medicare Other | Attending: Internal Medicine | Admitting: Internal Medicine

## 2011-02-23 DIAGNOSIS — R52 Pain, unspecified: Secondary | ICD-10-CM

## 2011-02-23 DIAGNOSIS — I739 Peripheral vascular disease, unspecified: Secondary | ICD-10-CM | POA: Diagnosis present

## 2011-02-23 DIAGNOSIS — I2510 Atherosclerotic heart disease of native coronary artery without angina pectoris: Secondary | ICD-10-CM | POA: Diagnosis present

## 2011-02-23 DIAGNOSIS — Z9181 History of falling: Secondary | ICD-10-CM

## 2011-02-23 DIAGNOSIS — D630 Anemia in neoplastic disease: Secondary | ICD-10-CM | POA: Diagnosis present

## 2011-02-23 DIAGNOSIS — N390 Urinary tract infection, site not specified: Principal | ICD-10-CM | POA: Diagnosis present

## 2011-02-23 DIAGNOSIS — S025XXA Fracture of tooth (traumatic), initial encounter for closed fracture: Secondary | ICD-10-CM | POA: Diagnosis present

## 2011-02-23 DIAGNOSIS — E860 Dehydration: Secondary | ICD-10-CM | POA: Diagnosis present

## 2011-02-23 DIAGNOSIS — N189 Chronic kidney disease, unspecified: Secondary | ICD-10-CM | POA: Diagnosis present

## 2011-02-23 DIAGNOSIS — Z8551 Personal history of malignant neoplasm of bladder: Secondary | ICD-10-CM

## 2011-02-23 DIAGNOSIS — E119 Type 2 diabetes mellitus without complications: Secondary | ICD-10-CM | POA: Diagnosis present

## 2011-02-23 DIAGNOSIS — I129 Hypertensive chronic kidney disease with stage 1 through stage 4 chronic kidney disease, or unspecified chronic kidney disease: Secondary | ICD-10-CM | POA: Diagnosis present

## 2011-02-23 DIAGNOSIS — S82009A Unspecified fracture of unspecified patella, initial encounter for closed fracture: Secondary | ICD-10-CM | POA: Diagnosis present

## 2011-02-23 DIAGNOSIS — R0602 Shortness of breath: Secondary | ICD-10-CM | POA: Diagnosis not present

## 2011-02-23 DIAGNOSIS — E039 Hypothyroidism, unspecified: Secondary | ICD-10-CM | POA: Diagnosis present

## 2011-02-23 DIAGNOSIS — R55 Syncope and collapse: Secondary | ICD-10-CM | POA: Diagnosis present

## 2011-02-23 DIAGNOSIS — R296 Repeated falls: Secondary | ICD-10-CM | POA: Diagnosis present

## 2011-02-23 DIAGNOSIS — D469 Myelodysplastic syndrome, unspecified: Secondary | ICD-10-CM | POA: Diagnosis present

## 2011-02-23 DIAGNOSIS — Y92009 Unspecified place in unspecified non-institutional (private) residence as the place of occurrence of the external cause: Secondary | ICD-10-CM

## 2011-02-23 DIAGNOSIS — R0789 Other chest pain: Secondary | ICD-10-CM | POA: Diagnosis present

## 2011-02-23 DIAGNOSIS — Z950 Presence of cardiac pacemaker: Secondary | ICD-10-CM

## 2011-02-23 LAB — CARDIAC PANEL(CRET KIN+CKTOT+MB+TROPI)
CK, MB: 2.3 ng/mL (ref 0.3–4.0)
Relative Index: INVALID (ref 0.0–2.5)
Total CK: 73 U/L (ref 7–177)

## 2011-02-23 LAB — POCT I-STAT, CHEM 8
Calcium, Ion: 1.15 mmol/L (ref 1.12–1.32)
Creatinine, Ser: 1.3 mg/dL — ABNORMAL HIGH (ref 0.50–1.10)
Hemoglobin: 11.2 g/dL — ABNORMAL LOW (ref 12.0–15.0)
Sodium: 140 mEq/L (ref 135–145)
TCO2: 26 mmol/L (ref 0–100)

## 2011-02-23 LAB — COMPREHENSIVE METABOLIC PANEL
Albumin: 3.1 g/dL — ABNORMAL LOW (ref 3.5–5.2)
Alkaline Phosphatase: 30 U/L — ABNORMAL LOW (ref 39–117)
BUN: 18 mg/dL (ref 6–23)
Sodium: 140 mEq/L (ref 135–145)
Total Protein: 6.3 g/dL (ref 6.0–8.3)

## 2011-02-23 LAB — CBC
HCT: 33 % — ABNORMAL LOW (ref 36.0–46.0)
Hemoglobin: 10.5 g/dL — ABNORMAL LOW (ref 12.0–15.0)
MCV: 94 fL (ref 78.0–100.0)
MCV: 93.8 fL (ref 78.0–100.0)
Platelets: 112 10*3/uL — ABNORMAL LOW (ref 150–400)
RBC: 3.51 MIL/uL — ABNORMAL LOW (ref 3.87–5.11)
RBC: 3.25 MIL/uL — ABNORMAL LOW (ref 3.87–5.11)
RDW: 18.5 % — ABNORMAL HIGH (ref 11.5–15.5)
RDW: 18.4 % — ABNORMAL HIGH (ref 11.5–15.5)
WBC: 9.9 10*3/uL (ref 4.0–10.5)
WBC: 10.5 10*3/uL (ref 4.0–10.5)

## 2011-02-23 LAB — DIFFERENTIAL
Basophils Absolute: 0 10*3/uL (ref 0.0–0.1)
Eosinophils Relative: 6 % — ABNORMAL HIGH (ref 0–5)
Lymphocytes Relative: 13 % (ref 12–46)
Lymphs Abs: 1.2 10*3/uL (ref 0.7–4.0)
Neutro Abs: 7.4 10*3/uL (ref 1.7–7.7)

## 2011-02-23 LAB — URINALYSIS, ROUTINE W REFLEX MICROSCOPIC
Ketones, ur: NEGATIVE mg/dL
Protein, ur: 30 mg/dL — AB
Urobilinogen, UA: 0.2 mg/dL (ref 0.0–1.0)

## 2011-02-23 LAB — PROTIME-INR: Prothrombin Time: 13.7 seconds (ref 11.6–15.2)

## 2011-02-23 LAB — PHOSPHORUS: Phosphorus: 2.4 mg/dL (ref 2.3–4.6)

## 2011-02-23 LAB — ABO/RH: ABO/RH(D): O POS

## 2011-02-23 LAB — CK TOTAL AND CKMB (NOT AT ARMC): Total CK: 69 U/L (ref 7–177)

## 2011-02-23 LAB — GLUCOSE, CAPILLARY: Glucose-Capillary: 189 mg/dL — ABNORMAL HIGH (ref 70–99)

## 2011-02-23 LAB — URINE MICROSCOPIC-ADD ON

## 2011-02-23 LAB — TROPONIN I: Troponin I: <0.3 ng/mL (ref <0.30)

## 2011-02-23 LAB — APTT: aPTT: 26 seconds (ref 24–37)

## 2011-02-23 LAB — MAGNESIUM: Magnesium: 1.6 mg/dL (ref 1.5–2.5)

## 2011-02-24 ENCOUNTER — Other Ambulatory Visit: Payer: Self-pay

## 2011-02-24 DIAGNOSIS — I059 Rheumatic mitral valve disease, unspecified: Secondary | ICD-10-CM

## 2011-02-24 DIAGNOSIS — D469 Myelodysplastic syndrome, unspecified: Secondary | ICD-10-CM

## 2011-02-24 DIAGNOSIS — R55 Syncope and collapse: Secondary | ICD-10-CM

## 2011-02-24 LAB — COMPREHENSIVE METABOLIC PANEL
Alkaline Phosphatase: 30 U/L — ABNORMAL LOW (ref 39–117)
BUN: 12 mg/dL (ref 6–23)
CO2: 30 mEq/L (ref 19–32)
Calcium: 9.1 mg/dL (ref 8.4–10.5)
GFR calc Af Amer: 50 mL/min — ABNORMAL LOW (ref >90)
GFR calc non Af Amer: 43 mL/min — ABNORMAL LOW (ref >90)
Glucose, Bld: 127 mg/dL — ABNORMAL HIGH (ref 70–99)
Potassium: 3.9 mEq/L (ref 3.5–5.1)
Total Protein: 6.2 g/dL (ref 6.0–8.3)

## 2011-02-24 LAB — LIPID PANEL
Cholesterol: 129 mg/dL (ref 0–200)
Total CHOL/HDL Ratio: 3.5 RATIO

## 2011-02-24 LAB — URINE CULTURE: Colony Count: 7000

## 2011-02-24 LAB — CARDIAC PANEL(CRET KIN+CKTOT+MB+TROPI)
CK, MB: 2.1 ng/mL (ref 0.3–4.0)
CK, MB: 2.8 ng/mL (ref 0.3–4.0)
Relative Index: INVALID (ref 0.0–2.5)
Total CK: 73 U/L (ref 7–177)
Troponin I: <0.3 ng/mL (ref <0.30)

## 2011-02-24 LAB — CBC
MCHC: 32.5 g/dL (ref 30.0–36.0)
RDW: 18.6 % — ABNORMAL HIGH (ref 11.5–15.5)

## 2011-02-24 LAB — MRSA PCR SCREENING: MRSA by PCR: NEGATIVE

## 2011-02-24 LAB — HEMOGLOBIN A1C
Hgb A1c MFr Bld: 7.1 % — ABNORMAL HIGH (ref <5.7)
Mean Plasma Glucose: 157 mg/dL — ABNORMAL HIGH (ref <117)

## 2011-02-24 LAB — GLUCOSE, CAPILLARY: Glucose-Capillary: 138 mg/dL — ABNORMAL HIGH (ref 70–99)

## 2011-02-24 NOTE — H&P (Signed)
[name], OSBOURN NO.:  [id number]  MEDICAL RECORD NO.:  [id number]  LOCATION:  [facility]                         FACILITY:  [facility]  PHYSICIAN:  Standley Dakins, MD   DATE OF BIRTH:  1923-09-04  DATE OF ADMISSION:  02/23/2011 DATE OF DISCHARGE:                             HISTORY & PHYSICAL   PRIMARY CARE PHYSICIAN:  Melvyn Novas, MD.  HEMATOLOGIST:  Levert Feinstein, M.D., F.A.C.P.  NEUROLOGIST:  Valetta Fuller, MD  CHIEF COMPLAINT:  Fall.  HISTORY OF PRESENT ILLNESS:  This patient is an 77 year old female who presented to the emergency department after falling down at home.  She reports that she was going to a doctor's appointment today.  She was in her bathroom and she turned to fix her hair and fell flat on her face. She knocked down her front teeth and fractured her right knee.  The patient says that she immediately got up after falling down and was in a pool of blood and started to collect her teeth.  She reports that she has been disoriented since that time.  Unfortunately, the patient has been having frequent episodes of passing out.  This is the worst episode.  She reports that she blacks out 1 time per month and that has occurred over the last 3-4 months.  The patient lives alone, but has family members that live close to her.  The patient denies having chest pain or shortness of breath before, during, or after the episode.  The patient reports that she has been urinating frequently for the past several weeks.  She did have a cystoscopy done several weeks ago to follow up on her history of bladder cancer.  The patient is also being treated for anemia and received a blood transfusion of 2 units approximately 1 month ago.  The patient reports that she otherwise has been doing fairly well.  She has no history of epilepsy.  Hospital admission was requested for further evaluation and monitoring for this syncopal episode.  PAST  MEDICAL HISTORY: 1. Coronary artery disease status post drug-eluting stent placement to     the ostial diagonal in June 2009. 2. Myelodysplastic syndrome followed by Dr. Cephas Darby. 3. Chronic renal insufficiency. 4. Hypothyroidism. 5. Hypertension. 6. Hyperlipidemia. 7. Peripheral artery disease with 79% RCA stenosis in 2009. 8. Symptomatic bradycardia with 1st and 2nd degree AV block status     post Medtronic pacemaker implantation in June 2009. 9. Diabetes mellitus type 2 non-insulin requiring. 10.GERD with a history of gastric erosions status post EGD in 2009. 11.Bladder cancer treated in 2003. 12.History of orthostatic hypotension. 13.Status post cholecystectomy. 14.Status post appendectomy. 15.Status post hysterectomy.  MEDICATIONS: 1. Hydrochlorothiazide 12.5 mg p.o. daily. 2. Aranesp injected every 3 weeks. 3. Paxil 10 mg daily. 4. Metformin 1000 mg p.o. b.i.d. 5. Aspirin 325 mg daily. 6. Crestor 20 mg daily. 7. Levothyroxine 112 mcg daily. 8. Tylenol 500 mg 2 tablets p.o. q.4-6 hours p.r.n. pain. 9. Advair Diskus 100/50 one puff inhaled b.i.d. only use p.r.n. and     last used approximately 3 months ago according to the patient.  ALLERGIES: 1. CODEINE. 2. PENICILLIN. 3. SULFA.  SOCIAL HISTORY:  [name] lives in [place]  She lives very close to her daughter and son who live next door.  Her granddaughter also lives close to her.  She denies tobacco, alcohol, or recreational drug use.  She is widowed.  FAMILY HISTORY:  The patient reports that her mother died of pneumonia. Her father died of old age.  She has siblings with lung cancer.  REVIEW OF SYSTEMS:  Significant for all of these systems and symptoms listed in the HPI, otherwise all systems reviewed and reported as negative.  PHYSICAL EXAMINATION:  CURRENT VITAL SIGNS:  Temperature 97.6, pulse 76, respirations 17, blood pressure 118/71, pulse ox 97% on room air. GENERAL:  This  is a well-developed elderly female.  She is lying in the bed.  She is in no apparent distress.  She is bruised on her lips and a small amount of blood around the lips and she has missing teeth in the front of the mouth from her recent fall. HEENT:  Normocephalic.  Bruising noted on the mouth as mentioned above. Abrasions and bleeding noted.  Nares clear.  Extraocular movements intact.  Sclerae are clear.  NECK:  Supple.  Thyroid soft.  No nodules or masses palpated. CARDIAC:  Normal S1 and S2 sounds, paced rhythm. LUNGS: Bilateral breath sounds with rare expiratory wheezes heard anteriorly.  ABDOMEN:  Soft, nontender, nondistended.  Bowel sounds present.  No guarding or rebound tenderness. EXTREMITIES:  Swollen right patella noted.  Abrasions on the upper extremities and psoriatic lesions noted on the right hand. NEUROLOGICAL EXAM:  Alert and oriented x3.  No focal deficits.  LABORATORY DATA:  White blood cell count 9.9, hemoglobin 10.5, hematocrit 33.0, platelet count 131.  Urinalysis reveals 11-20 white blood cells per high-power field, 7-10 red blood cells per high-power field, and bacteria.  Sodium 140, potassium 3.6, chloride 102, CO2 is 26, BUN 22, creatinine 1.30, glucose 189.  X-ray of the right elbow negative for fracture.  Chest x-ray chronic lung disease.  No acute cardiopulmonary disease noted.  X-ray of the right knee nondisplaced fracture of the patella noted.  CT of the head reveals atrophy and chronic ischemic change.  No acute abnormality seen.  No facial fracture seen.  CT of the cervical spine negative for fracture.  IMPRESSION: 1. An 77 year old female with recurrent syncopal episodes. 2. Urinary tract infection. 3. Clinical dehydration. 4. Anemia. 5. Diabetes mellitus type 2. 6. Dyslipidemia. 7. Coronary artery disease. 8. Hypothyroidism. 9. Acute right knee patellar fracture. 10.Psoriasis.  PLAN: 1. The patient is going to be admitted into the hospital  and worked up     for syncopal episodes. 2. We will treat the patient's dehydration with gentle IV fluids and     monitor fluid balance. 3. Monitor anemia. 4. Resume home medications for blood pressured and monitor blood     glucose. 5. Check an echocardiogram, carotid Doppler studies, and monitor     telemetry. 6. Consult orthopedist to evaluate and provide recommendations for the     right knee fracture.  Currently, the patient has a brace that has     been fitted on the right knee for stability. 7. The patient is requesting Dr. Eulah Pont for orthopedic care. 8. Monitor and make adjustments to medical care as required.     Standley Dakins, MD     CJ/MEDQ  D:  02/23/2011  T:  02/23/2011  Job:  [id number]  cc:   Melvyn Novas, MD Fax: [phone]  Valetta Fuller, MD Fax: [phone]  Levert Feinstein, M.D., F.A.C.P. Fax: [phone]  Electronically Signed by Standley Dakins  on 02/24/2011 11:49:50 PM

## 2011-02-25 ENCOUNTER — Inpatient Hospital Stay (HOSPITAL_COMMUNITY): Payer: Medicare Other

## 2011-02-25 LAB — CBC
MCH: 30.7 pg (ref 26.0–34.0)
MCHC: 32.6 g/dL (ref 30.0–36.0)
Platelets: 109 10*3/uL — ABNORMAL LOW (ref 150–400)
RDW: 18.7 % — ABNORMAL HIGH (ref 11.5–15.5)

## 2011-02-25 LAB — GLUCOSE, CAPILLARY
Glucose-Capillary: 147 mg/dL — ABNORMAL HIGH (ref 70–99)
Glucose-Capillary: 104 mg/dL — ABNORMAL HIGH (ref 70–99)
Glucose-Capillary: 202 mg/dL — ABNORMAL HIGH (ref 70–99)

## 2011-02-25 LAB — COMPREHENSIVE METABOLIC PANEL
ALT: 17 U/L (ref 0–35)
Albumin: 3 g/dL — ABNORMAL LOW (ref 3.5–5.2)
Alkaline Phosphatase: 30 U/L — ABNORMAL LOW (ref 39–117)
Calcium: 9 mg/dL (ref 8.4–10.5)
GFR calc Af Amer: 50 mL/min — ABNORMAL LOW (ref >90)
Potassium: 3.6 mEq/L (ref 3.5–5.1)
Sodium: 137 mEq/L (ref 135–145)
Total Protein: 6.1 g/dL (ref 6.0–8.3)

## 2011-02-26 DIAGNOSIS — S82009A Unspecified fracture of unspecified patella, initial encounter for closed fracture: Secondary | ICD-10-CM

## 2011-02-26 LAB — GLUCOSE, CAPILLARY
Glucose-Capillary: 158 mg/dL — ABNORMAL HIGH (ref 70–99)
Glucose-Capillary: 176 mg/dL — ABNORMAL HIGH (ref 70–99)

## 2011-02-26 LAB — BASIC METABOLIC PANEL
BUN: 10 mg/dL (ref 6–23)
CO2: 28 mEq/L (ref 19–32)
Glucose, Bld: 180 mg/dL — ABNORMAL HIGH (ref 70–99)
Potassium: 3.6 mEq/L (ref 3.5–5.1)
Sodium: 136 mEq/L (ref 135–145)

## 2011-02-26 LAB — CBC
HCT: 27.6 % — ABNORMAL LOW (ref 36.0–46.0)
Hemoglobin: 9.2 g/dL — ABNORMAL LOW (ref 12.0–15.0)
RBC: 2.96 MIL/uL — ABNORMAL LOW (ref 3.87–5.11)

## 2011-02-26 LAB — CARDIAC PANEL(CRET KIN+CKTOT+MB+TROPI): Relative Index: INVALID (ref 0.0–2.5)

## 2011-02-27 LAB — CROSSMATCH
Antibody Screen: NEGATIVE
Unit division: 0

## 2011-02-27 LAB — CARDIAC PANEL(CRET KIN+CKTOT+MB+TROPI)
Relative Index: INVALID (ref 0.0–2.5)
Total CK: 44 U/L (ref 7–177)
Troponin I: <0.3 ng/mL (ref <0.30)

## 2011-02-27 LAB — GLUCOSE, CAPILLARY
Glucose-Capillary: 177 mg/dL — ABNORMAL HIGH (ref 70–99)
Glucose-Capillary: 268 mg/dL — ABNORMAL HIGH (ref 70–99)

## 2011-02-27 LAB — OCCULT BLOOD X 1 CARD TO LAB, STOOL: Fecal Occult Bld: NEGATIVE

## 2011-03-01 ENCOUNTER — Inpatient Hospital Stay (HOSPITAL_COMMUNITY): Payer: Medicare Other

## 2011-03-01 ENCOUNTER — Other Ambulatory Visit: Payer: Self-pay | Admitting: Internal Medicine

## 2011-03-01 DIAGNOSIS — R9389 Abnormal findings on diagnostic imaging of other specified body structures: Secondary | ICD-10-CM

## 2011-03-01 LAB — GLUCOSE, CAPILLARY: Glucose-Capillary: 164 mg/dL — ABNORMAL HIGH (ref 70–99)

## 2011-03-01 LAB — URINE CULTURE
Colony Count: 15000
Culture  Setup Time: 201210142110

## 2011-03-01 MED ORDER — XENON XE 133 GAS
10.0000 | GAS_FOR_INHALATION | Freq: Once | RESPIRATORY_TRACT | Status: AC | PRN
  Administered 2011-03-01: 10 via RESPIRATORY_TRACT

## 2011-03-01 MED ORDER — TECHNETIUM TO 99M ALBUMIN AGGREGATED
5.0000 | Freq: Once | INTRAVENOUS | Status: AC | PRN
  Administered 2011-03-01: 5 via INTRAVENOUS

## 2011-03-01 NOTE — Consult Note (Signed)
NAMEGERALDY, [name] NO.:  [id number]  MEDICAL RECORD NO.:  [id number]  LOCATION:  1432                         FACILITY:  [facility]  PHYSICIAN:  Madlyn Frankel. Charlann Boxer, M.D.  DATE OF BIRTH:  08/22/1923  DATE OF CONSULTATION:  02/23/2011 DATE OF DISCHARGE:                                CONSULTATION   CHIEF COMPLAINT:  Right patella fracture.  [name] is a pleasant 77 year old female, seen and evaluated at [facility] in room 1432 with her son and grandson by her bedside.  She had apparently fallen at her home.  She lives alone and does not remember the event and felt to have syncopal episode.  She unfortunately fell directly forward, hitting her face and head. Workup was negative. She did lose some teeth at the incident and had knee pain.  She was brought to the emergency room.  Radiographs revealed patella fracture. Orthopedics was consulted.  She is admitted to the medical service for evaluation of her syncopal episode, chronic anemia, etc.  We  were asked to see and evaluate her for her right knee.  PAST MEDICAL HISTORY:  Includes: 1. Chronic anemia. 2. History of coronary disease. 3. History of stent placement, 2009. 4. History of myelodysplastic syndrome followed by Dr. Cyndie Chime. 5. History of renal insufficiency. 6. Hypothyroidism. 7. Hypertension. 8. Hyperlipidemia. 9. History of peripheral artery disease. 10.Symptomatic bradycardia . 11.Diabetes. 12.Reflux disease. 13.Bladder cancer treated in 2003. 14.History of orthostatic hypotension.  PAST SURGICAL HISTORY:  Includes: 1. Appendectomy. 2. Hysterectomy. 3. Cholecystectomy.  MEDICATIONS:  Hydrochlorothiazide, Aranesp, Paxil, metformin, aspirin, Crestor, levothyroxine, Tylenol, and Advair Diskus.  DRUG ALLERGIES: 1. CODEINE. 2. PENICILLIN. 3. SULFA.  SOCIAL HISTORY:  She lives in [place], [place].  She denies any tobacco or alcohol use.  She is widowed.  FAMILY HISTORY:   Mom died of pneumonia.  No known cause of death of her father.  She has 1 sibling with lung cancer.  REVIEW OF SYSTEMS:  Reviewed.  No orthopedic conditions associated or musculoskeletal conditions associated with her current admission, other than her right patella injury.  Otherwise review of systems reported by the admitting history and physical are reviewed.  PHYSICAL EXAMINATION:  GENERAL:  She is seen and evaluated in the hospital bed today.  She is awake, alert, and oriented.  She does have some bruising and some dried blood on her face, and has lost front right teeth, 2 or 3 of these teeth.  She otherwise is awake, alert. VITAL SIGNS:  Afebrile. Vitals are stable.  At this time, she is present with her son and grandson. EXTREMITIES:  She is currently in a knee immobilizer from the ER.  She has no cuts or laceration about the knee.  She has normal dorsi and plantar flexion of her foot and ankle.  RADIOGRAPHS:  Views of her right knee indicate a fracture to the dorsal 3rd of the patella, midportion of patella without continued displacement into the joint surface and no displacement of the patella fracture.  ASSESSMENT:  Nondisplaced patella fracture.  PLAN:  [name] treatment is going to be nonoperative management of her right patella fracture. She will use a knee  immobilizer.  Physical Therapy will be consulted for weightbearing as tolerated.  Activity with knee immobilizer to prevent further flexion.  Recommendations for needs of walker versus a cane will be provided.  She can follow up with an orthopedist in 2 to 3 weeks for clinical and radiographic followup.  Ice can otherwise be used for pain in addition to Tylenol other than mild narcotics given her current situation.  She will currently be in the hospital for a period of time while they do her workup.  She has seen Dr. Mckinley Jewel in the past and of course can get back to see him or would be glad to see him at  [facility], office [phone].     Madlyn Frankel Charlann Boxer, M.D.     MDO/MEDQ  D:  02/23/2011  T:  02/24/2011  Job:  [id number]  Electronically Signed by Durene Romans M.D. on 03/01/2011 08:52:19 AM

## 2011-03-02 ENCOUNTER — Encounter: Payer: Medicare Other | Admitting: Internal Medicine

## 2011-03-02 LAB — GLUCOSE, CAPILLARY
Glucose-Capillary: 210 mg/dL — ABNORMAL HIGH (ref 70–99)
Glucose-Capillary: 160 mg/dL — ABNORMAL HIGH (ref 70–99)

## 2011-03-06 NOTE — Discharge Summary (Signed)
NAMEBERNETA, SCONYERS NO.:  [id number]  MEDICAL RECORD NO.:  [id number]  LOCATION:                                 FACILITY:  PHYSICIAN:  Henretta Quist Bosie Helper, MD      DATE OF BIRTH:  1923-10-29  DATE OF ADMISSION:  02/23/2011 DATE OF DISCHARGE:  03/01/2011                              DISCHARGE SUMMARY   PRIMARY CARE PHYSICIAN:  Melvyn Novas, M.D.  HEMATOLOGIST:  Levert Feinstein, M.D., F.A.C.P.  UROLOGIST:  Valetta Fuller, M.D.  DISCHARGE DIAGNOSES: 1. Status post fall, cause is likely secondary to deconditioning. 2. History of recurrent fall. 3. Right-sided chest pain, secondary to muscular from the fall. 4. Right patellar fracture of the patella. 5. History of chronic anemia, secondary to myelodysplasia, received     Aranesp. 6. Status post pacemaker, which is interrogated during hospital stay. 7. Diabetes mellitus with hemoglobin A1c of 7.1. 8. Hypothyroidism. 9. Hypertension. 10.Hyperlipidemia.  DISCHARGE MEDICATIONS: 1. Lisinopril 5 mg p.o. daily. 2. Tramadol 50 mg p.o. q.6 hours as needed. 3. Aspirin 325 mg daily. 4. Hydrochlorothiazide 12.5 mg daily. 5. Levothyroxine 112 mcg daily. 6. Metformin 1000 mg twice daily. 7. Nitroglycerin 0.4 mg every 5 minutes as needed. 8. Paxil 10 mg p.o. daily. 9. Crestor 20 mg p.o. at bedtime.  PROCEDURES: 1. Chronic lung disease.  No acute cardiopulmonary process. 2. X-ray of the knee, right nondisplaced fracture of the patella. 3. CT of the head.  Atrophy and chronic ischemic change.  No acute     abnormality. 4. CT maxillofacial.  Negative for facial fracture. 5. CT of C-spine.  Negative for fracture of the cervical spine. 6. V/Q scan, low probability for PE.  CONSULTATIONS: orthopedicas and cardeology  HISTORY OF PRESENT ILLNESS:  This is an 77 year old female, who presented to the ED after falling down at home.  She was in her bathroom and tend to fix her hair and fell flat on her  face.  She knocked down her front teeth and fractured her right knee.  The patient denies any syncopal episode.  The patient admitted she has been falling.  She denies any chest pain or shortness of breath.  An EKG did show electronic paced rhythm. 1. Fall.  The patient had a pacemaker, which we interrogated.  Cardiac     enzymes cycled, which were negative.  The patient was kept under     telemonitor.  The patient had a 2D echo done, which showed some     mild change and I will let Dr. Graciela Husbands know before discharging the     patient, V/Q scan negative and as I mentioned, cardiac enzymes     negative and the patient denies any left-sided chest pain.  Her     chest pain is mainly on the right side and was told that is     musculoskeletal in nature. 2. Anemia, secondary to myelodysplastic syndrome.  The patient has     received Aranesp. 3. Hyperlipidemia.  The patient will continue her home medication. 4. Hypertension.  Lisinopril and hydrochlorothiazide added to her     medications. 5. Right patellar fracture ortho consulted_ done  by Dr. Durene Romans.  Their     recommendation, knee immobilizer, physical therapy for     weightbearing as tolerated.  She needs to follow up with Dr. Charlann Boxer     within the next 3 weeks for clinical and angiographic followup.     Dr. Charlann Boxer is happy to see her in [facility] office at     [phone].  From our standpoint, the patient is medically cleared to     be discharged.     Xavia Kniskern Bosie Helper, MD     HIE/MEDQ  D:  03/01/2011  T:  03/01/2011  Job:  [id number]  Electronically Signed by Ebony Cargo MD on 03/06/2011 12:27:16 PM

## 2011-03-16 ENCOUNTER — Other Ambulatory Visit: Payer: Self-pay | Admitting: Oncology

## 2011-03-16 ENCOUNTER — Encounter (HOSPITAL_BASED_OUTPATIENT_CLINIC_OR_DEPARTMENT_OTHER): Payer: Medicare Other | Admitting: Oncology

## 2011-03-16 DIAGNOSIS — D509 Iron deficiency anemia, unspecified: Secondary | ICD-10-CM

## 2011-03-16 DIAGNOSIS — E039 Hypothyroidism, unspecified: Secondary | ICD-10-CM

## 2011-03-16 DIAGNOSIS — D696 Thrombocytopenia, unspecified: Secondary | ICD-10-CM

## 2011-03-16 DIAGNOSIS — D649 Anemia, unspecified: Secondary | ICD-10-CM

## 2011-03-16 LAB — CBC WITH DIFFERENTIAL/PLATELET
Basophils Absolute: 0 10*3/uL (ref 0.0–0.1)
EOS%: 5.4 % (ref 0.0–7.0)
Eosinophils Absolute: 0.5 10*3/uL (ref 0.0–0.5)
HGB: 11.2 g/dL — ABNORMAL LOW (ref 11.6–15.9)
NEUT#: 6.1 10*3/uL (ref 1.5–6.5)
RDW: 19.7 % — ABNORMAL HIGH (ref 11.2–14.5)
lymph#: 1.6 10*3/uL (ref 0.9–3.3)

## 2011-03-20 NOTE — Consult Note (Signed)
NAMEKENDRA, GRISSETT NO.:  [id number]  MEDICAL RECORD NO.:  [id number]  LOCATION:  1432                         FACILITY:  [facility]  PHYSICIAN:  Pricilla Riffle, MD, FACCDATE OF BIRTH:  08-07-1923  DATE OF CONSULTATION: DATE OF DISCHARGE:  03/02/2011                                CONSULTATION   IDENTIFICATION:  The patient is an 77 year old who we are asked to see regarding an abnormal test.  The patient is an 77 year old admitted on October 9th, she fell at home, knocked out her teeth, fractured her knee.  She denied syncope.  She got up early that morning and felt okay, had breakfast, got dressed, went to the bathroom and got up to put her makeup on.  She opened the cabinet, turned to the left, knew she was going to fall, but denies passing out.  In the hospital, she was consulted by Orthopedic Surgery and Hematology. A permanent pacemaker was interrogated and appeared to be working okay.  She did have an episode of chest pain on October 12th, felt to be muscular, shortness of breath on October 14, given IV Lasix and IV fluids were discontinued.  Lisinopril was added.  Currently she notes no shortness of breath.  No chest pain.  ALLERGIES:  CODEINE, PENICILLIN and SULFA.  PAST MEDICAL HISTORY: 1. Coronary artery disease, status post DES to the diagonal in 2002. 2. Orthostatic hypotension. 3. Myelodysplastic syndrome. 4. URI. 5. Hypothyroidism. 6. Hypertension. 7. Dyslipidemia. 8. Bradycardia leading to permanent pacemaker in June 2009. 9. Diabetes. 10.GE reflux. 11.Bladder cancer.  PAST SURGICAL HISTORY:  Status post cholecystectomy.  Status post pacer implant.  Status post appendectomy.  Status post TAH.  MEDICATIONS AT HOME:  Aspirin, Crestor, Paxil, Synthroid, metformin. Here in the hospital, they are the same, plus hydrochlorothiazide, which has been discontinued, Lasix she got x1, 40 mg lisinopril started on October 14, and IV fluids at 50  mL/h.  SOCIAL HISTORY:  The patient lives at home.  She is widow.  Does not smoke.  Does not drink.  FAMILY HISTORY:  Noncontributory.  REVIEW OF SYSTEMS:  All systems were reviewed and negative to the above problem except as noted above.  PHYSICAL EXAMINATION:  GENERAL:  On exam, the patient is in no acute distress at rest. VITAL SIGNS:  Blood pressure 112-130/60-70, pulse is 72, and on tele, it is paced, temperature is 98.  I's and O's today -840 mL.  Orthostatics on February 25, 2011, showed pulse lying of 63, blood pressure 136/72; sitting pulse 76, blood pressure 130/72; standing pulse 79, blood pressure 130/65. HEENT:  Normocephalic, atraumatic.  EOMI. NECK:  JVP is normal.  No bruits.  No thyromegaly. LUNGS:  Clear to auscultation.  No rales or wheezes. CARDIAC:  Regular rate and rhythm, S1, S2.  No S3.  No significant murmurs. ABDOMEN:  Shows mild diffuse tenderness.  No masses.  Normal bowel sounds.  No rebound. EXTREMITIES:  Right leg in brace.  No edema.  2+ posterior tibial pulses.  Echocardiogram shows 50-55% LVEF with septal wall motion consistent with pacer delay.  Carotid duplex scan shows no stenosis in the ICA with 60- 79% stenosis.  Vertebral flow is antegrade.  Chest x-ray, no acute disease.  V/Q scan, low probability for PE.  A 12-lead EKG shows sinus rhythm, ventricular paced.  IMPRESSION:  The patient is an 77 year old with history of orthostatic intolerance, hypertension, bradycardia (status post pacemaker) admitted with sudden fall.  There was no syncope on her report, still suspicious for orthostasis, though not orthostatics here.  Workup otherwise was negative.  Echo was without change.  RECOMMENDATIONS:  I will follow.  I would hold blood pressure meds.  Let her blood pressure went a little higher.  She is going to rehab and this can be followed closely.  I would avoid sudden up, down, changes in position, would use support hose.  Will make sure  she has follow up with Sherryl Manges.     Pricilla Riffle, MD, [facility]     PVR/MEDQ  D:  03/19/2011  T:  03/20/2011  Job:  [id number]

## 2011-03-23 ENCOUNTER — Telehealth: Payer: Self-pay | Admitting: *Deleted

## 2011-03-23 ENCOUNTER — Telehealth: Payer: Self-pay | Admitting: Cardiology

## 2011-03-23 NOTE — Telephone Encounter (Signed)
Daughter is sending another [employer] statement to you. This insurance will help pay for hospital stay

## 2011-03-23 NOTE — Telephone Encounter (Signed)
Pt daughter calling asking that Synetta Fail go ahead and send paper to Dr. Graciela Husbands to fill out. No need to call back.

## 2011-03-23 NOTE — Telephone Encounter (Signed)
Pt needs physicians statemant pt's dtr faxed checking on status pls call [phone] dtr pat [name]

## 2011-03-25 ENCOUNTER — Telehealth: Payer: Self-pay | Admitting: Internal Medicine

## 2011-03-25 NOTE — Telephone Encounter (Signed)
Pt's daughter sent a physicians statement yesterday and wanted to make sure you got it

## 2011-03-25 NOTE — Telephone Encounter (Signed)
I left a message for [name] to call.

## 2011-03-26 NOTE — Telephone Encounter (Signed)
I left a message for [name] to call. I have the form she sent for the patient. It has been signed by Dr. Graciela Husbands, but there are some blanks we have to find info for and complete.

## 2011-03-30 NOTE — Telephone Encounter (Signed)
Pt daughter returning call to speak to [facility]. Please return call.

## 2011-03-30 NOTE — Telephone Encounter (Signed)
LMTC

## 2011-03-31 ENCOUNTER — Telehealth: Payer: Self-pay | Admitting: Internal Medicine

## 2011-03-31 NOTE — Telephone Encounter (Signed)
I spoke with the patient's daughter earlier this afternoon to let her know I had faxed the form to her office fax. She states she was out of the office for her mother birthday, but would send her husband to look for this, and if she did not call back today, she had the form. No call received as of 5:15pm.

## 2011-03-31 NOTE — Telephone Encounter (Signed)
Previous encounter open. Closing this encounter.

## 2011-03-31 NOTE — Telephone Encounter (Signed)
Late entry: I spoke with the patient's daughter last night and explained the Physician Statement form was filled out as much as possible. She will attach her EOB's from those dates and send with the form. I faxed the form to [phone] today.

## 2011-03-31 NOTE — Telephone Encounter (Signed)
Wanted to let you know fax was not received, fax [phone]

## 2011-03-31 NOTE — Telephone Encounter (Signed)
[name] 03/31/2011 10:40 AM Signed  Wanted to let you know fax was not received, fax [phone]       I left a message on the Mrs. Whitt's cell number that I faxed the form at 10:17am to the fax number left and received confirmation that this went through. I attempted to call the work #, but Mrs. Whitt is not there today.

## 2011-04-02 ENCOUNTER — Other Ambulatory Visit: Payer: Self-pay | Admitting: Oncology

## 2011-04-02 ENCOUNTER — Encounter: Payer: Self-pay | Admitting: Oncology

## 2011-04-02 DIAGNOSIS — D469 Myelodysplastic syndrome, unspecified: Secondary | ICD-10-CM

## 2011-04-02 DIAGNOSIS — N189 Chronic kidney disease, unspecified: Secondary | ICD-10-CM

## 2011-04-02 HISTORY — DX: Myelodysplastic syndrome, unspecified: D46.9

## 2011-04-02 NOTE — Progress Notes (Signed)
  CC: Melvyn Novas, MD  Gerrit Friends. Dietrich Pates, MD, [facility]   Soon to be 77 year old woman followed here for a myelodysplastic syndrome.  She is on chronic Aranesp injections to maintain her hemoglobin above 10 g.   I received a call from Dr. Willa Rough last month.  The patient was hospitalized with anginal chest pain.  She has known underlying ischemic cardiac disease.  She has had a previous pacemaker implant, previous LAD drug-eluting coronary stent in June 2009.  She developed left shoulder pain radiating down her arm and then dyspnea.  Myocardial infarction was ruled out.  She had a Myoview stress study, results not available at time of this dictation.  She had an echocardiogram which showed a normal ejection fraction around 60% to 65%.  Hemoglobin was 7.3 at time of admission and she was transfused 2 units of blood.     Her shoulder pain and dyspnea have resolved.  No new symptoms since hospital discharge on 08/29.  She does complain of weakness and unsteadiness.  She does not go out much anymore.  PHYSICAL EXAMINATION:  Blood pressure 127/74, pulse 85 regular.  She is afebrile.  Weight 117, stable.  Head and Neck:  Normal.  Lungs:  Clear.  Regular cardiac rhythm.  No murmur.  Abdomen:  Soft, nontender.  No mass, no organomegaly.  Extremities:  No edema.  Neurologic:  No focal deficit.  Gait not tested.  LABORATORY DATA:  Hemoglobin 11.4, hematocrit 35, MCV 90, white count 10,000, 67 neutrophils, 15 lymphocytes, 11 monocytes, platelets 114,000.  Chem profile:  BUN 24, creatinine 1.4, random glucose 158, potassium 3.3, uric acid 10.5.  IMPRESSION:   1. Low-risk myelodysplastic syndrome stable on intermittent Aranesp injections.  Plan to continue the same.  It is not clear why her hemoglobin fell so low at time of recent admission.  We have been able to keep her hemoglobins at 9 g or above with the Aranesp.  She was a little bit lower than usual at time of her last injection on 12/22/2010  with hemoglobin 8.8.  We will continue to monitor counts every 3 weeks and adjust the Aranesp if necessary. 2. Coronary artery disease status post pacemaker for heart block, status post drug-eluting stent of the left anterior descending.  Recent evaluation unremarkable after the patient received a transfusion.  I think we will try to maintain her hemoglobin at 10 g or above. 3. Chronic mild renal insufficiency, current creatinine 1.4. 4. Hypothyroidism on replacement. 5. Hyperlipidemia. 6. Essential hypertension. 7. Type 2 diabetes on Glucophage.   ADDENDUM:  Myoview study was normal.    ______________________________ Levert Feinstein, M.D., F.A.C.P. JMG/MEDQ  D:  02/08/2011  T:  02/08/2011  Job:  [id number]

## 2011-04-06 ENCOUNTER — Encounter: Payer: Self-pay | Admitting: Physician Assistant

## 2011-04-06 ENCOUNTER — Ambulatory Visit (INDEPENDENT_AMBULATORY_CARE_PROVIDER_SITE_OTHER): Payer: Medicare Other | Admitting: Physician Assistant

## 2011-04-06 ENCOUNTER — Ambulatory Visit (INDEPENDENT_AMBULATORY_CARE_PROVIDER_SITE_OTHER): Payer: Medicare Other | Admitting: *Deleted

## 2011-04-06 ENCOUNTER — Other Ambulatory Visit (HOSPITAL_BASED_OUTPATIENT_CLINIC_OR_DEPARTMENT_OTHER): Payer: Medicare Other | Admitting: Lab

## 2011-04-06 ENCOUNTER — Other Ambulatory Visit: Payer: Self-pay | Admitting: Internal Medicine

## 2011-04-06 ENCOUNTER — Encounter: Payer: Self-pay | Admitting: Internal Medicine

## 2011-04-06 ENCOUNTER — Other Ambulatory Visit: Payer: Self-pay | Admitting: Oncology

## 2011-04-06 ENCOUNTER — Ambulatory Visit: Payer: Medicare Other

## 2011-04-06 VITALS — BP 131/67 | HR 88 | Ht 60.0 in | Wt 115.0 lb

## 2011-04-06 DIAGNOSIS — I472 Ventricular tachycardia: Secondary | ICD-10-CM

## 2011-04-06 DIAGNOSIS — D469 Myelodysplastic syndrome, unspecified: Secondary | ICD-10-CM

## 2011-04-06 DIAGNOSIS — D696 Thrombocytopenia, unspecified: Secondary | ICD-10-CM

## 2011-04-06 DIAGNOSIS — I10 Essential (primary) hypertension: Secondary | ICD-10-CM

## 2011-04-06 DIAGNOSIS — D649 Anemia, unspecified: Secondary | ICD-10-CM

## 2011-04-06 DIAGNOSIS — R42 Dizziness and giddiness: Secondary | ICD-10-CM

## 2011-04-06 DIAGNOSIS — E785 Hyperlipidemia, unspecified: Secondary | ICD-10-CM

## 2011-04-06 DIAGNOSIS — I2510 Atherosclerotic heart disease of native coronary artery without angina pectoris: Secondary | ICD-10-CM | POA: Insufficient documentation

## 2011-04-06 DIAGNOSIS — E039 Hypothyroidism, unspecified: Secondary | ICD-10-CM

## 2011-04-06 DIAGNOSIS — I441 Atrioventricular block, second degree: Secondary | ICD-10-CM

## 2011-04-06 LAB — CBC WITH DIFFERENTIAL/PLATELET
BASO%: 0.1 % (ref 0.0–2.0)
EOS%: 4 % (ref 0.0–7.0)
MCH: 33 pg (ref 25.1–34.0)
MCHC: 32.6 g/dL (ref 31.5–36.0)
MCV: 101.2 fL — ABNORMAL HIGH (ref 79.5–101.0)
MONO%: 5.6 % (ref 0.0–14.0)
RBC: 3.36 10*6/uL — ABNORMAL LOW (ref 3.70–5.45)
RDW: 18.7 % — ABNORMAL HIGH (ref 11.2–14.5)
lymph#: 1.4 10*3/uL (ref 0.9–3.3)

## 2011-04-06 LAB — PACEMAKER DEVICE OBSERVATION
AL AMPLITUDE: 0.7 mv
AL THRESHOLD: 0.75 V
BAMS-0001: 150 {beats}/min
RV LEAD IMPEDENCE PM: 465 Ohm
RV LEAD THRESHOLD: 0.625 V
VENTRICULAR PACING PM: 100

## 2011-04-06 MED ORDER — DARBEPOETIN ALFA-POLYSORBATE 500 MCG/ML IJ SOLN: 300.0000 ug | Freq: Once | INTRAMUSCULAR | Status: DC

## 2011-04-06 NOTE — Assessment & Plan Note (Signed)
Managed by PCP

## 2011-04-06 NOTE — Assessment & Plan Note (Signed)
She had normal LVF by recent echo and an normal Myoview in the last several mos.  Check BMET and Mg2+ today.

## 2011-04-06 NOTE — Assessment & Plan Note (Signed)
Controlled.  Continue current therapy.  

## 2011-04-06 NOTE — Progress Notes (Signed)
PPM check 

## 2011-04-06 NOTE — Assessment & Plan Note (Signed)
Resolved.  Workup in the hospital unrevealing.  Likely related to orthostatic intolerance from the HCTZ.

## 2011-04-06 NOTE — Patient Instructions (Addendum)
Your physician wants you to follow-up in: 6 MONTHS WITH DR. Graciela Husbands. You will receive a reminder letter in the mail two months in advance. If you don't receive a letter, please call our office to schedule the follow-up appointment.  Your physician recommends that you return for lab work in: BMET, MAGNESIUM 427.1 PT WILL GET THIS DONE @ DR. GRANDFORTUNA OFFICE TODAY, RX GIVEN FOR LAB ORDER WITH RESULTS TO BE FAXED TO [place], PA-C [phone].

## 2011-04-06 NOTE — Progress Notes (Signed)
Addended by: Tarri Fuller on: 04/06/2011 09:57 AM   Modules accepted: Orders

## 2011-04-06 NOTE — Progress Notes (Signed)
History of Present Illness: PCP:  Dr. Janna Arch Primary Cardiologist:  Dr. Sherryl Manges  Primary Electrophysiologist:  Dr. Sherryl Manges   [name] is a 77 y.o. female presents for post-hospitalization followup.  She has a history of CAD, s/p Taxus DES to the oD1 in 6/09 and residual 70% in prox right PL branch treated medically, bradycardia, s/p Medtronic dual chamber pacer in 6/09, DM2, HTN, HLP, hypothyroidism, carotid stenosis, RICA 50% by MRA in 2009, CKD, bladder CA, myelodysplastic syndrome (followed by Dr. Cyndie Chime), PUD, asthmatic bronchitis.  Last myoview 8/12 after admxn for chest pain:   Impression: Normal lexiscan myovue EF 70%  She was admitted 10/9-10/15.  She presented with a fall that was ultimately felt to be related to deconditioning vs orthostasis.  There was no reported syncope.  She knocked out some teeth and fractured her knee.  Pacer was interrogated and working appropriately.  She had some chest pain that was felt to be MSK.  MI was ruled out.  Carotid dopplers 10/12: RICA 40-59%.  Echo done 10/12 did not mention LVF but notable for mild MR.  Dr. Dietrich Pates saw patient in consultation and noted EF was 50-55%.  Of note, Echo 8/12:  Mild LVH, EF 60-65%, grade 1 diast dysfxn, mild MR.  VQ scan was low probability for pulmonary embolism.    Pertinent Labs: K 3.6, creatinine 1.04, ALT 22, Hgb 9.2, BNP 395, TSH 0.257 (low), hemoccult negative.  She is out of rehab.  Knee feels better.  The patient denies chest pain, significant shortness of breath, syncope, orthopnea, PND or significant pedal edema.  No further dizziness.  Feels like stopping HCTZ was helpful.  Of note, pacer interrogated today.  Functioning appropriately.  There was a short run of what appears to be NSVT.  Discussed with patient and she was asymptomatic.    Past Medical History  Diagnosis Date  . CAD (coronary artery disease)   . PVD (peripheral vascular disease)   . Carotid artery stenosis   . HTN  (hypertension)   . Hyperlipidemia   . Hypothyroidism   . Chronic renal failure   . Myelodysplastic syndrome 04/02/2011    Current Outpatient Prescriptions  Medication Sig Dispense Refill  . acetaminophen (TYLENOL) 500 MG tablet Take 500 mg by mouth every 6 (six) hours as needed.        Marland Kitchen amLODipine (NORVASC) 5 MG tablet Take 5 mg by mouth daily.        Marland Kitchen aspirin 81 MG tablet Take 81 mg by mouth daily.        Marland Kitchen levothyroxine (SYNTHROID, LEVOTHROID) 112 MCG tablet Take 112 mcg by mouth daily.        . metFORMIN (GLUMETZA) 1000 MG (MOD) 24 hr tablet Take 1,000 mg by mouth 2 (two) times daily with a meal.        . Mometasone Furo-Formoterol Fum (DULERA) 200-5 MCG/ACT AERO Inhale into the lungs. Use as needed       . montelukast (SINGULAIR) 10 MG tablet as needed.        Marland Kitchen PARoxetine (PAXIL) 10 MG tablet Take 10 mg by mouth daily.        . rosuvastatin (CRESTOR) 20 MG tablet Take 20 mg by mouth daily.        Marland Kitchen zolpidem (AMBIEN) 5 MG tablet Take 5 mg by mouth at bedtime as needed.          Allergies: Allergies  Allergen Reactions  . Codeine   . Penicillins   .  Sulfonamide Derivatives     History  Substance Use Topics  . Smoking status: Never Smoker   . Smokeless tobacco: Not on file  . Alcohol Use: No     Vital Signs: BP 131/67  Pulse 88  Ht 5' (1.524 m)  Wt 115 lb (52.164 kg)  BMI 22.46 kg/m2  PHYSICAL EXAM: Well nourished, well developed, in no acute distress HEENT: normal Neck: no JVD at 90 degrees Cardiac:  normal S1, S2; RRR; no murmur Lungs:  clear to auscultation bilaterally, no wheezing, rhonchi or rales Abd: soft, nontender, no hepatomegaly Ext: no edema MSK:  Right knee in an immobilizer Skin: warm and dry Neuro:  CNs 2-12 intact, no focal abnormalities noted  ASSESSMENT AND PLAN:

## 2011-04-06 NOTE — Assessment & Plan Note (Signed)
Continue ASA and statin.  Follow up with Dr. Sherryl Manges in 6 mos.

## 2011-04-13 ENCOUNTER — Encounter: Payer: Self-pay | Admitting: *Deleted

## 2011-04-13 NOTE — Progress Notes (Signed)
Copy of lab done 04/06/11 sent electronically to Dr. Janna Arch & Dr. Sibley Bing.

## 2011-04-27 ENCOUNTER — Other Ambulatory Visit: Payer: Self-pay | Admitting: Oncology

## 2011-04-27 ENCOUNTER — Other Ambulatory Visit (HOSPITAL_BASED_OUTPATIENT_CLINIC_OR_DEPARTMENT_OTHER): Payer: Medicare Other | Admitting: Lab

## 2011-04-27 ENCOUNTER — Ambulatory Visit: Payer: Medicare Other

## 2011-04-27 DIAGNOSIS — D509 Iron deficiency anemia, unspecified: Secondary | ICD-10-CM

## 2011-04-27 DIAGNOSIS — D469 Myelodysplastic syndrome, unspecified: Secondary | ICD-10-CM

## 2011-04-27 LAB — CBC WITH DIFFERENTIAL/PLATELET
Basophils Absolute: 0 10*3/uL (ref 0.0–0.1)
EOS%: 10.3 % — ABNORMAL HIGH (ref 0.0–7.0)
Eosinophils Absolute: 0.9 10*3/uL — ABNORMAL HIGH (ref 0.0–0.5)
HGB: 11.1 g/dL — ABNORMAL LOW (ref 11.6–15.9)
MONO%: 10.8 % (ref 0.0–14.0)
NEUT#: 5.4 10*3/uL (ref 1.5–6.5)
RBC: 3.34 10*6/uL — ABNORMAL LOW (ref 3.70–5.45)
RDW: 14.9 % — ABNORMAL HIGH (ref 11.2–14.5)
lymph#: 1.5 10*3/uL (ref 0.9–3.3)
nRBC: 0 % (ref 0–0)

## 2011-04-27 MED ORDER — DARBEPOETIN ALFA-POLYSORBATE 500 MCG/ML IJ SOLN: 300.0000 ug | Freq: Once | INTRAMUSCULAR | Status: DC

## 2011-05-07 ENCOUNTER — Telehealth: Payer: Self-pay | Admitting: Hematology and Oncology

## 2011-05-07 NOTE — Telephone Encounter (Signed)
Returned pt call re 2013 appts. Informed pt 2013 appts have been scheduled and gv next date for 1/2. Pt will get schedule when she comes.

## 2011-05-19 ENCOUNTER — Ambulatory Visit (HOSPITAL_BASED_OUTPATIENT_CLINIC_OR_DEPARTMENT_OTHER): Payer: Medicare Other

## 2011-05-19 ENCOUNTER — Other Ambulatory Visit: Payer: Self-pay | Admitting: Oncology

## 2011-05-19 ENCOUNTER — Other Ambulatory Visit: Payer: Medicare Other

## 2011-05-19 VITALS — BP 137/74 | HR 86 | Temp 98.9°F | Wt 115.3 lb

## 2011-05-19 DIAGNOSIS — D469 Myelodysplastic syndrome, unspecified: Secondary | ICD-10-CM

## 2011-05-19 LAB — CBC WITH DIFFERENTIAL/PLATELET
BASO%: 0.5 % (ref 0.0–2.0)
Eosinophils Absolute: 0.6 10*3/uL — ABNORMAL HIGH (ref 0.0–0.5)
HCT: 31.4 % — ABNORMAL LOW (ref 34.8–46.6)
LYMPH%: 17.8 % (ref 14.0–49.7)
MCHC: 33 g/dL (ref 31.5–36.0)
MONO#: 0.5 10*3/uL (ref 0.1–0.9)
NEUT#: 4.8 10*3/uL (ref 1.5–6.5)
Platelets: 122 10*3/uL — ABNORMAL LOW (ref 145–400)
RBC: 3.02 10*6/uL — ABNORMAL LOW (ref 3.70–5.45)
WBC: 7.2 10*3/uL (ref 3.9–10.3)
lymph#: 1.3 10*3/uL (ref 0.9–3.3)

## 2011-05-19 MED ORDER — DARBEPOETIN ALFA-POLYSORBATE 500 MCG/ML IJ SOLN
300.0000 ug | Freq: Once | INTRAMUSCULAR | Status: AC
  Administered 2011-05-19: 300 ug via SUBCUTANEOUS
  Filled 2011-05-19: qty 1

## 2011-06-09 ENCOUNTER — Other Ambulatory Visit: Payer: Medicare Other | Admitting: Lab

## 2011-06-09 ENCOUNTER — Telehealth: Payer: Self-pay | Admitting: *Deleted

## 2011-06-09 ENCOUNTER — Ambulatory Visit: Payer: Medicare Other

## 2011-06-09 NOTE — Telephone Encounter (Signed)
Patient missed lab and injection appt.  Called and left message to call back to reschedule.

## 2011-06-30 ENCOUNTER — Ambulatory Visit: Payer: Medicare Other

## 2011-06-30 ENCOUNTER — Other Ambulatory Visit (HOSPITAL_BASED_OUTPATIENT_CLINIC_OR_DEPARTMENT_OTHER): Payer: Medicare Other | Admitting: Lab

## 2011-06-30 DIAGNOSIS — D509 Iron deficiency anemia, unspecified: Secondary | ICD-10-CM

## 2011-06-30 DIAGNOSIS — D469 Myelodysplastic syndrome, unspecified: Secondary | ICD-10-CM

## 2011-06-30 LAB — CBC WITH DIFFERENTIAL/PLATELET
Basophils Absolute: 0 10*3/uL (ref 0.0–0.1)
EOS%: 8.6 % — ABNORMAL HIGH (ref 0.0–7.0)
HCT: 33.2 % — ABNORMAL LOW (ref 34.8–46.6)
HGB: 11 g/dL — ABNORMAL LOW (ref 11.6–15.9)
LYMPH%: 15.2 % (ref 14.0–49.7)
MCH: 33.5 pg (ref 25.1–34.0)
MCHC: 33.2 g/dL (ref 31.5–36.0)
MCV: 100.7 fL (ref 79.5–101.0)
NEUT%: 68.6 % (ref 38.4–76.8)
Platelets: 116 10*3/uL — ABNORMAL LOW (ref 145–400)
lymph#: 1.3 10*3/uL (ref 0.9–3.3)

## 2011-06-30 NOTE — Progress Notes (Signed)
Aranesp held today d/t hgb 11.0.  Verified with Efraim Kaufmann, pharmacist.

## 2011-06-30 NOTE — Progress Notes (Signed)
Aranesp held today d/t hgb 11.0.  Verified with Melissa, pharmacist. 

## 2011-07-13 ENCOUNTER — Ambulatory Visit: Payer: Medicare Other | Admitting: Hematology and Oncology

## 2011-07-13 ENCOUNTER — Other Ambulatory Visit: Payer: Medicare Other

## 2011-07-13 ENCOUNTER — Ambulatory Visit: Payer: Medicare Other

## 2011-07-16 ENCOUNTER — Encounter: Payer: Self-pay | Admitting: Family Medicine

## 2011-07-16 ENCOUNTER — Ambulatory Visit (INDEPENDENT_AMBULATORY_CARE_PROVIDER_SITE_OTHER): Payer: Medicare Other | Admitting: Family Medicine

## 2011-07-16 DIAGNOSIS — M129 Arthropathy, unspecified: Secondary | ICD-10-CM

## 2011-07-16 DIAGNOSIS — C679 Malignant neoplasm of bladder, unspecified: Secondary | ICD-10-CM

## 2011-07-16 DIAGNOSIS — R319 Hematuria, unspecified: Secondary | ICD-10-CM

## 2011-07-16 DIAGNOSIS — I441 Atrioventricular block, second degree: Secondary | ICD-10-CM

## 2011-07-16 DIAGNOSIS — E119 Type 2 diabetes mellitus without complications: Secondary | ICD-10-CM

## 2011-07-16 DIAGNOSIS — I10 Essential (primary) hypertension: Secondary | ICD-10-CM

## 2011-07-16 DIAGNOSIS — R42 Dizziness and giddiness: Secondary | ICD-10-CM

## 2011-07-16 DIAGNOSIS — J329 Chronic sinusitis, unspecified: Secondary | ICD-10-CM

## 2011-07-16 DIAGNOSIS — M1990 Unspecified osteoarthritis, unspecified site: Secondary | ICD-10-CM

## 2011-07-16 DIAGNOSIS — D469 Myelodysplastic syndrome, unspecified: Secondary | ICD-10-CM

## 2011-07-16 DIAGNOSIS — D649 Anemia, unspecified: Secondary | ICD-10-CM

## 2011-07-16 DIAGNOSIS — E039 Hypothyroidism, unspecified: Secondary | ICD-10-CM

## 2011-07-16 DIAGNOSIS — T7840XA Allergy, unspecified, initial encounter: Secondary | ICD-10-CM

## 2011-07-16 DIAGNOSIS — E785 Hyperlipidemia, unspecified: Secondary | ICD-10-CM

## 2011-07-16 DIAGNOSIS — G4700 Insomnia, unspecified: Secondary | ICD-10-CM

## 2011-07-16 DIAGNOSIS — N390 Urinary tract infection, site not specified: Secondary | ICD-10-CM

## 2011-07-16 HISTORY — DX: Allergy, unspecified, initial encounter: T78.40XA

## 2011-07-16 HISTORY — DX: Urinary tract infection, site not specified: N39.0

## 2011-07-16 HISTORY — DX: Insomnia, unspecified: G47.00

## 2011-07-16 LAB — POCT URINALYSIS DIPSTICK
Glucose, UA: NEGATIVE
Spec Grav, UA: 1.02
Urobilinogen, UA: 0.2
pH, UA: 5.5

## 2011-07-16 MED ORDER — GUAIFENESIN ER 600 MG PO TB12: 1200.0000 mg | ORAL_TABLET | Freq: Two times a day (BID) | ORAL | Status: DC

## 2011-07-16 MED ORDER — CIPROFLOXACIN HCL 250 MG PO TABS: 250.0000 mg | ORAL_TABLET | Freq: Two times a day (BID) | ORAL | Status: AC

## 2011-07-16 NOTE — Patient Instructions (Signed)

## 2011-07-16 NOTE — Assessment & Plan Note (Signed)
Had an episode of vertigo earlier this week and fell backwards and hit her head. No break in skin or fracture

## 2011-07-18 ENCOUNTER — Encounter: Payer: Self-pay | Admitting: Family Medicine

## 2011-07-18 DIAGNOSIS — M1990 Unspecified osteoarthritis, unspecified site: Secondary | ICD-10-CM | POA: Insufficient documentation

## 2011-07-18 DIAGNOSIS — C679 Malignant neoplasm of bladder, unspecified: Secondary | ICD-10-CM | POA: Insufficient documentation

## 2011-07-18 DIAGNOSIS — D649 Anemia, unspecified: Secondary | ICD-10-CM

## 2011-07-18 DIAGNOSIS — J329 Chronic sinusitis, unspecified: Secondary | ICD-10-CM

## 2011-07-18 DIAGNOSIS — D539 Nutritional anemia, unspecified: Secondary | ICD-10-CM | POA: Insufficient documentation

## 2011-07-18 HISTORY — DX: Unspecified osteoarthritis, unspecified site: M19.90

## 2011-07-18 HISTORY — DX: Chronic sinusitis, unspecified: J32.9

## 2011-07-18 HISTORY — DX: Anemia, unspecified: D64.9

## 2011-07-18 NOTE — Assessment & Plan Note (Signed)
Has pacer in place and follows with cardiology

## 2011-07-18 NOTE — Assessment & Plan Note (Signed)
Will request old records, no change in meds at this time.

## 2011-07-18 NOTE — Assessment & Plan Note (Signed)
Tolerating Crestor, avoid trans fats, consider fatty acid supplement

## 2011-07-18 NOTE — Assessment & Plan Note (Signed)
Has appt with urology soon

## 2011-07-18 NOTE — Progress Notes (Signed)
Patient ID: [name], female   DOB: 19-Jan-1924, 78 y.o.   MRN: [id number] [name] [id number] August 26, 1923 07/18/2011      Progress Note New Patient  Subjective  Chief Complaint  Chief Complaint  Patient presents with  . Establish Care    New acute: cough     HPI  Patient is an 78 year old Caucasian female who is in today for new patient appointment. She's been feeling poorly this week she still fatigued and exhausted. She did fall and bruised her right shoulder recently but that is improving. She's had several days of nasal congestion productive yellow phlegm. Cough productive of yellow phlegm. She feels tight in her chest and short of breath and wheezing at times. She noticed some anorexia but denies nausea, vomiting, diarrhea or constipation. She denies palpitations or chest pain. She denies sore throat or pain she has a lot of pressure in her head but denies actual headache. She is a long complicated past medical history including bladder cancer, diabetes, hypertension and depression. Overall she feels her depression is manageable. She's had a lot of trouble with her cardiovascular health and follows with cardiology. Has a pacemaker in place.  Past Medical History  Diagnosis Date  . CAD (coronary artery disease)   . PVD (peripheral vascular disease)   . Carotid artery stenosis   . HTN (hypertension)   . Hyperlipidemia   . Hypothyroidism   . Chronic renal failure   . Diabetes mellitus 07/16/2011  . Allergic state 07/16/2011  . Insomnia 07/16/2011  . Recurrent UTI 07/16/2011  . Asthma     childhood, recurrent at 12  . Myelodysplastic syndrome 04/02/2011    bladder cancer  . Bladder cancer 07/18/2011  . Sinusitis 07/18/2011  . Anemia 07/18/2011  . Arthritis 07/18/2011    Past Surgical History  Procedure Date  . Cholecystectomy   . Pcm - medtronic   . Abdominal hysterectomy   . Shoulder open rotator cuff repair   . Foot surgery     bunionectomy    Family History  Problem  Relation Age of Onset  . Heart disease Mother     chf  . Arthritis Mother   . Arthritis Father   . Kidney disease Father   . Hypertension Father   . Cancer Brother     lung  . Arthritis Daughter     MVA with very serious injuries    History   Social History  . Marital Status: Widowed    Spouse Name: N/A    Number of Children: N/A  . Years of Education: N/A   Occupational History  . Not on file.   Social History Main Topics  . Smoking status: Never Smoker   . Smokeless tobacco: Not on file  . Alcohol Use: No  . Drug Use: No  . Sexually Active: No   Other Topics Concern  . Not on file   Social History Narrative  . No narrative on file    Current Outpatient Prescriptions on File Prior to Visit  Medication Sig Dispense Refill  . acetaminophen (TYLENOL) 500 MG tablet Take 500 mg by mouth every 6 (six) hours as needed.        Marland Kitchen amLODipine (NORVASC) 5 MG tablet Take 5 mg by mouth daily.        Marland Kitchen aspirin 81 MG tablet Take 81 mg by mouth daily.        Marland Kitchen levothyroxine (SYNTHROID, LEVOTHROID) 112 MCG tablet Take 112 mcg by mouth daily.        Marland Kitchen  metFORMIN (GLUMETZA) 1000 MG (MOD) 24 hr tablet Take 1,000 mg by mouth 2 (two) times daily with a meal.        . Mometasone Furo-Formoterol Fum (DULERA) 200-5 MCG/ACT AERO Inhale into the lungs. Use as needed       . montelukast (SINGULAIR) 10 MG tablet as needed.        Marland Kitchen PARoxetine (PAXIL) 10 MG tablet Take 10 mg by mouth daily.        . rosuvastatin (CRESTOR) 20 MG tablet Take 20 mg by mouth daily.          Allergies  Allergen Reactions  . Codeine   . Penicillins   . Sulfonamide Derivatives     Review of Systems  Review of Systems  Constitutional: Positive for malaise/fatigue. Negative for fever and chills.  HENT: Positive for congestion. Negative for hearing loss, ear pain and nosebleeds.   Eyes: Negative for discharge.  Respiratory: Positive for sputum production and shortness of breath. Negative for cough and  wheezing.   Cardiovascular: Negative for chest pain, palpitations and leg swelling.  Gastrointestinal: Negative for heartburn, nausea, vomiting, abdominal pain, diarrhea, constipation and blood in stool.  Genitourinary: Positive for frequency. Negative for dysuria, urgency and hematuria.  Musculoskeletal: Positive for myalgias. Negative for back pain and falls.  Skin: Negative for rash.  Neurological: Negative for dizziness, tremors, sensory change, focal weakness, loss of consciousness, weakness and headaches.  Endo/Heme/Allergies: Negative for polydipsia. Does not bruise/bleed easily.  Psychiatric/Behavioral: Negative for depression and suicidal ideas. The patient is not nervous/anxious and does not have insomnia.     Objective  BP 112/60  Pulse 72  Temp(Src) 98 F (36.7 C) (Oral)  Wt 110 lb (49.896 kg)  Physical Exam  Physical Exam  Constitutional: She is oriented to person, place, and time and well-developed, well-nourished, and in no distress. No distress.  HENT:  Head: Normocephalic and atraumatic.  Right Ear: External ear normal.  Left Ear: External ear normal.  Nose: Nose normal.  Mouth/Throat: Oropharynx is clear and moist. No oropharyngeal exudate.       Nasal mucosa boggy and erythema.   Eyes: Conjunctivae are normal. Pupils are equal, round, and reactive to light. Right eye exhibits no discharge. Left eye exhibits no discharge. No scleral icterus.  Neck: Normal range of motion. Neck supple. No thyromegaly present.  Cardiovascular: Normal rate, regular rhythm and intact distal pulses.   Murmur heard.      Pacer palpated in LUpper CW, 1/6 M  Pulmonary/Chest: Effort normal and breath sounds normal. No respiratory distress. She has no wheezes. She has no rales.  Abdominal: Soft. Bowel sounds are normal. She exhibits no distension and no mass. There is no tenderness.  Musculoskeletal: Normal range of motion. She exhibits no edema and no tenderness.  Lymphadenopathy:     She has no cervical adenopathy.  Neurological: She is alert and oriented to person, place, and time. She has normal reflexes. No cranial nerve deficit. Coordination normal.  Skin: Skin is warm and dry. No rash noted. She is not diaphoretic.  Psychiatric: Mood, memory and affect normal.       Assessment & Plan  Dizziness Had an episode of vertigo earlier this week and fell backwards and hit her head. No break in skin or fracture  Bladder cancer Has appt with urology soon.   Diabetes mellitus Will request records from patient's previous PMD. No change in meds, encouraged to minimize simple carbs.  HYPERTENSION, UNSPECIFIED Well controlled at today's visit.  No changes  Recurrent UTI Recently treated now with decreased nocturia  SECOND DEGREE AV BLOCK, MOBITZ II Has pacer in place and follows with cardiology  Sinusitis Given Ciprofloxacin bid, encouraged Mucinex bid and increased hydration. Report if symptoms do not improve.  HYPOTHYROIDISM Will request old records, no change in meds at this time.  HYPERLIPIDEMIA-MIXED Tolerating Crestor, avoid trans fats, consider fatty acid supplement  Myelodysplastic syndrome Patient following with hematology, has chronic anemia as a result  Arthritis Most notable in right knee which she fractured this past October.

## 2011-07-18 NOTE — Assessment & Plan Note (Signed)
Will request records from patient's previous PMD. No change in meds, encouraged to minimize simple carbs.

## 2011-07-18 NOTE — Assessment & Plan Note (Signed)
Well controlled at today's visit. No changes 

## 2011-07-18 NOTE — Assessment & Plan Note (Addendum)
Patient following with hematology, has chronic anemia as a result

## 2011-07-18 NOTE — Assessment & Plan Note (Signed)
Given Ciprofloxacin bid, encouraged Mucinex bid and increased hydration. Report if symptoms do not improve.

## 2011-07-18 NOTE — Assessment & Plan Note (Signed)
Recently treated now with decreased nocturia

## 2011-07-18 NOTE — Assessment & Plan Note (Signed)
Most notable in right knee which she fractured this past October.

## 2011-07-19 ENCOUNTER — Telehealth: Payer: Self-pay

## 2011-07-19 LAB — URINE CULTURE

## 2011-07-19 NOTE — Telephone Encounter (Signed)
FYIAurther Loft with [facility] stated that patient is pretty much back to normal. Pt is able to stand again to get diaper on/off, alert, and will speak/ answer questions again.

## 2011-07-19 NOTE — Telephone Encounter (Signed)
Information entered into the wrong chart

## 2011-07-21 ENCOUNTER — Other Ambulatory Visit: Payer: Medicare Other

## 2011-07-21 ENCOUNTER — Ambulatory Visit: Payer: Medicare Other

## 2011-07-21 ENCOUNTER — Ambulatory Visit (HOSPITAL_BASED_OUTPATIENT_CLINIC_OR_DEPARTMENT_OTHER): Payer: Medicare Other | Admitting: Nurse Practitioner

## 2011-07-21 ENCOUNTER — Telehealth: Payer: Self-pay | Admitting: Oncology

## 2011-07-21 ENCOUNTER — Ambulatory Visit: Payer: Medicare Other | Admitting: Nurse Practitioner

## 2011-07-21 ENCOUNTER — Other Ambulatory Visit (INDEPENDENT_AMBULATORY_CARE_PROVIDER_SITE_OTHER): Payer: Medicare Other

## 2011-07-21 VITALS — BP 138/72 | HR 82 | Temp 97.6°F | Ht 60.0 in | Wt 112.3 lb

## 2011-07-21 DIAGNOSIS — I2510 Atherosclerotic heart disease of native coronary artery without angina pectoris: Secondary | ICD-10-CM

## 2011-07-21 DIAGNOSIS — D469 Myelodysplastic syndrome, unspecified: Secondary | ICD-10-CM

## 2011-07-21 DIAGNOSIS — D638 Anemia in other chronic diseases classified elsewhere: Secondary | ICD-10-CM

## 2011-07-21 DIAGNOSIS — D649 Anemia, unspecified: Secondary | ICD-10-CM

## 2011-07-21 DIAGNOSIS — R350 Frequency of micturition: Secondary | ICD-10-CM

## 2011-07-21 LAB — POCT URINALYSIS DIPSTICK
Glucose, UA: NEGATIVE
Ketones, UA: NEGATIVE
Protein, UA: 100
Urobilinogen, UA: 0.2

## 2011-07-21 LAB — CBC WITH DIFFERENTIAL/PLATELET
BASO%: 0.2 % (ref 0.0–2.0)
Basophils Absolute: 0 10*3/uL (ref 0.0–0.1)
Eosinophils Absolute: 0.7 10*3/uL — ABNORMAL HIGH (ref 0.0–0.5)
HCT: 31.7 % — ABNORMAL LOW (ref 34.8–46.6)
HGB: 10.4 g/dL — ABNORMAL LOW (ref 11.6–15.9)
LYMPH%: 13.7 % — ABNORMAL LOW (ref 14.0–49.7)
MONO#: 0.7 10*3/uL (ref 0.1–0.9)
NEUT#: 5.5 10*3/uL (ref 1.5–6.5)
NEUT%: 68.8 % (ref 38.4–76.8)
Platelets: 163 10*3/uL (ref 145–400)
WBC: 8.1 10*3/uL (ref 3.9–10.3)
lymph#: 1.1 10*3/uL (ref 0.9–3.3)

## 2011-07-21 MED ORDER — DARBEPOETIN ALFA-POLYSORBATE 300 MCG/0.6ML IJ SOLN
300.0000 ug | Freq: Once | INTRAMUSCULAR | Status: AC
  Administered 2011-07-21: 300 ug via SUBCUTANEOUS
  Filled 2011-07-21: qty 0.6

## 2011-07-21 NOTE — Progress Notes (Signed)
OFFICE PROGRESS NOTE  Interval history:  [name] is an 78 year old woman with myelodysplastic syndrome. She is maintained on Aranesp injections on a 3 week schedule to keep the hemoglobin greater than 10 g. She is seen today for scheduled followup.  [name] reports that overall she feels well. She states her energy is "okay". She denies shortness of breath. No bleeding.   Objective: Blood pressure 138/72, pulse 82, temperature 97.6 F (36.4 C), temperature source Oral, height 5' (1.524 m), weight 112 lb 4.8 oz (50.939 kg).  Oropharynx is without thrush or ulceration. No palpable cervical, supraclavicular or axillary lymph nodes. Lungs are clear. No wheezes or rales. Regular cardiac rhythm. Abdomen is soft and nontender. No organomegaly. Extremities are without edema. Calves are soft and nontender.  Lab Results: Lab Results  Component Value Date   WBC 8.1 07/21/2011   HGB 10.4* 07/21/2011   HCT 31.7* 07/21/2011   MCV 100.9 07/21/2011   PLT 163 07/21/2011    Chemistry:    Chemistry      Component Value Date/Time   NA 136 02/26/2011 0525   K 3.6 02/26/2011 0525   CL 99 02/26/2011 0525   CO2 28 02/26/2011 0525   BUN 10 02/26/2011 0525   CREATININE 1.04 02/26/2011 0525      Component Value Date/Time   CALCIUM 8.6 02/26/2011 0525   ALKPHOS 30* 02/25/2011 0445   AST 19 02/25/2011 0445   ALT 17 02/25/2011 0445   BILITOT 0.5 02/25/2011 0445       Studies/Results: No results found.  Medications: I have reviewed the patient's current medications.  Assessment/Plan:  1. Myelodysplastic syndrome. We are checking CBCs on a 3 week schedule and administering Aranesp as indicated. 2. Coronary artery disease status post pacemaker for heart block, status post drug-eluting stent of the left anterior descending. 3. Chronic mild renal insufficiency. 4. Hypothyroidism on replacement. 5. Hyperlipidemia. 6. Essential hypertension. 7. Type 2 diabetes on metformin.  Disposition-[name]  appears stable. We will continue to check blood counts on a 3 week schedule with Aranesp as indicated. She will return for a followup visit in June 2013. She will contact the office the interim with any problems.  Lonna Cobb ANP/GNP-BC

## 2011-07-21 NOTE — Telephone Encounter (Signed)
appts made and printed for  q 3week for  Lab inj and a md visit on 6/18   aom

## 2011-07-27 ENCOUNTER — Ambulatory Visit: Payer: Medicare Other | Admitting: Nurse Practitioner

## 2011-08-10 ENCOUNTER — Other Ambulatory Visit: Payer: Medicare Other | Admitting: Lab

## 2011-08-10 ENCOUNTER — Ambulatory Visit (HOSPITAL_BASED_OUTPATIENT_CLINIC_OR_DEPARTMENT_OTHER): Payer: Medicare Other

## 2011-08-10 VITALS — BP 147/72 | HR 90 | Temp 97.1°F

## 2011-08-10 DIAGNOSIS — D469 Myelodysplastic syndrome, unspecified: Secondary | ICD-10-CM

## 2011-08-10 LAB — CBC WITH DIFFERENTIAL/PLATELET
BASO%: 0.4 % (ref 0.0–2.0)
Basophils Absolute: 0 10*3/uL (ref 0.0–0.1)
EOS%: 4.9 % (ref 0.0–7.0)
HCT: 32.4 % — ABNORMAL LOW (ref 34.8–46.6)
LYMPH%: 16.4 % (ref 14.0–49.7)
MCH: 32.7 pg (ref 25.1–34.0)
MCHC: 32.7 g/dL (ref 31.5–36.0)
NEUT%: 67.3 % (ref 38.4–76.8)
Platelets: 130 10*3/uL — ABNORMAL LOW (ref 145–400)

## 2011-08-10 MED ORDER — DARBEPOETIN ALFA-POLYSORBATE 300 MCG/0.6ML IJ SOLN
300.0000 ug | Freq: Once | INTRAMUSCULAR | Status: AC
  Administered 2011-08-10: 300 ug via SUBCUTANEOUS
  Filled 2011-08-10: qty 0.6

## 2011-08-16 ENCOUNTER — Ambulatory Visit (INDEPENDENT_AMBULATORY_CARE_PROVIDER_SITE_OTHER): Payer: Medicare Other | Admitting: Family Medicine

## 2011-08-16 ENCOUNTER — Encounter: Payer: Self-pay | Admitting: Family Medicine

## 2011-08-16 VITALS — BP 136/75 | HR 84 | Temp 97.4°F | Ht 60.0 in | Wt 109.1 lb

## 2011-08-16 DIAGNOSIS — E785 Hyperlipidemia, unspecified: Secondary | ICD-10-CM

## 2011-08-16 DIAGNOSIS — E119 Type 2 diabetes mellitus without complications: Secondary | ICD-10-CM

## 2011-08-16 DIAGNOSIS — Z9841 Cataract extraction status, right eye: Secondary | ICD-10-CM

## 2011-08-16 DIAGNOSIS — D649 Anemia, unspecified: Secondary | ICD-10-CM

## 2011-08-16 DIAGNOSIS — R05 Cough: Secondary | ICD-10-CM

## 2011-08-16 DIAGNOSIS — J329 Chronic sinusitis, unspecified: Secondary | ICD-10-CM

## 2011-08-16 DIAGNOSIS — R350 Frequency of micturition: Secondary | ICD-10-CM

## 2011-08-16 DIAGNOSIS — F419 Anxiety disorder, unspecified: Secondary | ICD-10-CM

## 2011-08-16 DIAGNOSIS — I10 Essential (primary) hypertension: Secondary | ICD-10-CM

## 2011-08-16 DIAGNOSIS — Z9849 Cataract extraction status, unspecified eye: Secondary | ICD-10-CM

## 2011-08-16 DIAGNOSIS — F341 Dysthymic disorder: Secondary | ICD-10-CM

## 2011-08-16 MED ORDER — BENZONATATE 100 MG PO CAPS: 100.0000 mg | ORAL_CAPSULE | Freq: Three times a day (TID) | ORAL | Status: AC | PRN

## 2011-08-16 NOTE — Patient Instructions (Signed)

## 2011-08-17 ENCOUNTER — Other Ambulatory Visit (INDEPENDENT_AMBULATORY_CARE_PROVIDER_SITE_OTHER): Payer: Medicare Other

## 2011-08-17 DIAGNOSIS — E119 Type 2 diabetes mellitus without complications: Secondary | ICD-10-CM

## 2011-08-17 DIAGNOSIS — E785 Hyperlipidemia, unspecified: Secondary | ICD-10-CM

## 2011-08-17 DIAGNOSIS — R350 Frequency of micturition: Secondary | ICD-10-CM

## 2011-08-17 DIAGNOSIS — I10 Essential (primary) hypertension: Secondary | ICD-10-CM

## 2011-08-17 LAB — RENAL FUNCTION PANEL
Albumin: 4 g/dL (ref 3.5–5.2)
BUN: 25 mg/dL — ABNORMAL HIGH (ref 6–23)
GFR: 42.09 mL/min — ABNORMAL LOW (ref >60.00)
Glucose, Bld: 156 mg/dL — ABNORMAL HIGH (ref 70–99)
Phosphorus: 4 mg/dL (ref 2.3–4.6)
Potassium: 4.2 mEq/L (ref 3.5–5.1)

## 2011-08-17 LAB — LIPID PANEL
Cholesterol: 123 mg/dL (ref 0–200)
LDL Cholesterol: 41 mg/dL (ref 0–99)
Total CHOL/HDL Ratio: 2
VLDL: 29.8 mg/dL (ref 0.0–40.0)

## 2011-08-17 LAB — MICROALBUMIN / CREATININE URINE RATIO: Microalb, Ur: 14.4 mg/dL — ABNORMAL HIGH (ref 0.0–1.9)

## 2011-08-17 LAB — CBC
HCT: 32.7 % — ABNORMAL LOW (ref 36.0–46.0)
MCV: 101.3 fl — ABNORMAL HIGH (ref 78.0–100.0)
RDW: 16.5 % — ABNORMAL HIGH (ref 11.5–14.6)
WBC: 7.7 10*3/uL (ref 4.5–10.5)

## 2011-08-17 LAB — HEPATIC FUNCTION PANEL
ALT: 15 U/L (ref 0–35)
AST: 22 U/L (ref 0–37)
Alkaline Phosphatase: 37 U/L — ABNORMAL LOW (ref 39–117)
Bilirubin, Direct: 0 mg/dL (ref 0.0–0.3)
Total Bilirubin: 0.6 mg/dL (ref 0.3–1.2)

## 2011-08-17 LAB — TSH: TSH: 0.13 u[IU]/mL — ABNORMAL LOW (ref 0.35–5.50)

## 2011-08-18 ENCOUNTER — Other Ambulatory Visit: Payer: Self-pay | Admitting: Oncology

## 2011-08-18 ENCOUNTER — Encounter: Payer: Self-pay | Admitting: Family Medicine

## 2011-08-18 ENCOUNTER — Other Ambulatory Visit: Payer: Self-pay | Admitting: *Deleted

## 2011-08-18 DIAGNOSIS — Z9841 Cataract extraction status, right eye: Secondary | ICD-10-CM

## 2011-08-18 DIAGNOSIS — D469 Myelodysplastic syndrome, unspecified: Secondary | ICD-10-CM

## 2011-08-18 DIAGNOSIS — F419 Anxiety disorder, unspecified: Secondary | ICD-10-CM

## 2011-08-18 DIAGNOSIS — F32A Depression, unspecified: Secondary | ICD-10-CM | POA: Insufficient documentation

## 2011-08-18 DIAGNOSIS — F329 Major depressive disorder, single episode, unspecified: Secondary | ICD-10-CM | POA: Insufficient documentation

## 2011-08-18 HISTORY — DX: Anxiety disorder, unspecified: F41.9

## 2011-08-18 HISTORY — DX: Cataract extraction status, right eye: Z98.41

## 2011-08-18 HISTORY — DX: Depression, unspecified: F32.A

## 2011-08-18 NOTE — Assessment & Plan Note (Signed)
Symptoms essentially resolved

## 2011-08-18 NOTE — Assessment & Plan Note (Signed)
Check hgba1c with blood draw, avoid simple carbs

## 2011-08-18 NOTE — Assessment & Plan Note (Signed)
Has recently been told she may need surgery and does note she has been straining more and having occasional fleeting pains in the muscles of the forehead above the eye, may apply warm compresses and topical ointments, report if symptoms worsen

## 2011-08-18 NOTE — Assessment & Plan Note (Signed)
Adequately controlled, no changes 

## 2011-08-18 NOTE — Assessment & Plan Note (Signed)
Doing well on low dose Paroxetine, no changes today

## 2011-08-18 NOTE — Progress Notes (Signed)
Patient ID: [name], female   DOB: 09-Aug-1923, 78 y.o.   MRN: [id number] CHARLIE CHAR [id number] 10/14/1923 08/18/2011      Progress Note-Follow Up  Subjective  Chief Complaint  Chief Complaint  Patient presents with  . Follow-up    1 month follow up    HPI  Is an 78 year old patient female who is in today for followup. Overall she's doing relatively well. Her anemia has worsened again and she has restarted her Aranesp shots roughly every third week. She does note when her anemia worsened she does have more trouble with disequilibrium and stumbling. She's not any falls or syncope. She doesn't know which is worse when she stands up quickly. She also notes some slight blurring in her vision especially in the right field and has been told recently she is cataract. She may need to consider surgery. She does note since then she's had some intermittent Sharp pains in her forehead which resolved spontaneously. No chest pain no palpitations, shortness of breath, GI or GU complaints. Her mood is improved and stable. Her sugars have been well controlled recently. No polyuria or polydipsia. She reports her mood response to the Paxil. No new or acute concerns noted at this time.  Past Medical History  Diagnosis Date  . CAD (coronary artery disease)   . PVD (peripheral vascular disease)   . Carotid artery stenosis   . HTN (hypertension)   . Hyperlipidemia   . Hypothyroidism   . Chronic renal failure   . Diabetes mellitus 07/16/2011  . Allergic state 07/16/2011  . Insomnia 07/16/2011  . Recurrent UTI 07/16/2011  . Asthma     childhood, recurrent at 89  . Myelodysplastic syndrome 04/02/2011    bladder cancer  . Bladder cancer 07/18/2011  . Sinusitis 07/18/2011  . Anemia 07/18/2011  . Arthritis 07/18/2011  . Cataract extraction status of right eye 08/18/2011  . Anxiety and depression 08/18/2011    Past Surgical History  Procedure Date  . Cholecystectomy   . Pcm - medtronic   . Abdominal  hysterectomy   . Shoulder open rotator cuff repair   . Foot surgery     bunionectomy    Family History  Problem Relation Age of Onset  . Heart disease Mother     chf  . Arthritis Mother   . Arthritis Father   . Kidney disease Father   . Hypertension Father   . Cancer Brother     lung  . Arthritis Daughter     MVA with very serious injuries    History   Social History  . Marital Status: Widowed    Spouse Name: N/A    Number of Children: N/A  . Years of Education: N/A   Occupational History  . Not on file.   Social History Main Topics  . Smoking status: Never Smoker   . Smokeless tobacco: Never Used  . Alcohol Use: No  . Drug Use: No  . Sexually Active: No   Other Topics Concern  . Not on file   Social History Narrative  . No narrative on file    Current Outpatient Prescriptions on File Prior to Visit  Medication Sig Dispense Refill  . acetaminophen (TYLENOL) 500 MG tablet Take 500 mg by mouth every 6 (six) hours as needed.        Marland Kitchen amLODipine (NORVASC) 5 MG tablet Take 5 mg by mouth daily.        Marland Kitchen aspirin 81 MG tablet Take 81  mg by mouth daily.        Marland Kitchen levothyroxine (SYNTHROID, LEVOTHROID) 112 MCG tablet Take 112 mcg by mouth daily.        . metFORMIN (GLUMETZA) 1000 MG (MOD) 24 hr tablet Take 1,000 mg by mouth 2 (two) times daily with a meal.        . Mometasone Furo-Formoterol Fum (DULERA) 200-5 MCG/ACT AERO Inhale into the lungs. Use as needed       . montelukast (SINGULAIR) 10 MG tablet as needed.        Marland Kitchen PARoxetine (PAXIL) 10 MG tablet Take 10 mg by mouth daily.        . rosuvastatin (CRESTOR) 20 MG tablet Take 20 mg by mouth daily.          Allergies  Allergen Reactions  . Codeine   . Penicillins   . Sulfonamide Derivatives     Review of Systems  Review of Systems  Constitutional: Positive for malaise/fatigue. Negative for fever.  HENT: Negative for congestion.   Eyes: Positive for blurred vision. Negative for double vision, photophobia,  pain and discharge.       Slightly right eye, cataract  Respiratory: Negative for shortness of breath.   Cardiovascular: Negative for chest pain, palpitations and leg swelling.  Gastrointestinal: Negative for nausea, abdominal pain and diarrhea.  Genitourinary: Negative for dysuria.  Musculoskeletal: Negative for falls.  Skin: Negative for rash.  Neurological: Positive for weakness and headaches. Negative for dizziness, tingling, tremors and loss of consciousness.  Endo/Heme/Allergies: Negative for polydipsia.  Psychiatric/Behavioral: Negative for depression and suicidal ideas. The patient is not nervous/anxious and does not have insomnia.     Objective  BP 136/75  Pulse 84  Temp(Src) 97.4 F (36.3 C) (Temporal)  Ht 5' (1.524 m)  Wt 109 lb 1.9 oz (49.497 kg)  BMI 21.31 kg/m2  SpO2 97%  Physical Exam  Physical Exam  Constitutional: She is oriented to person, place, and time and well-developed, well-nourished, and in no distress. No distress.  HENT:  Head: Normocephalic and atraumatic.  Eyes: Conjunctivae are normal.  Neck: Neck supple. No thyromegaly present.  Cardiovascular: Normal rate and regular rhythm.   Murmur heard.      1/6 sys m  Pulmonary/Chest: Effort normal and breath sounds normal. She has no wheezes.  Abdominal: She exhibits no distension and no mass.  Musculoskeletal: She exhibits no edema.  Lymphadenopathy:    She has no cervical adenopathy.  Neurological: She is alert and oriented to person, place, and time.  Skin: Skin is warm and dry. No rash noted. She is not diaphoretic.  Psychiatric: Memory, affect and judgment normal.    Lab Results  Component Value Date   TSH 0.13* 08/17/2011   Lab Results  Component Value Date   WBC 7.7 08/17/2011   HGB 10.5* 08/17/2011   HCT 32.7* 08/17/2011   MCV 101.3* 08/17/2011   PLT 143.0* 08/17/2011   Lab Results  Component Value Date   CREATININE 1.3* 08/17/2011   BUN 25* 08/17/2011   NA 139 08/17/2011   K 4.2 08/17/2011   CL  100 08/17/2011   CO2 29 08/17/2011   Lab Results  Component Value Date   ALT 15 08/17/2011   AST 22 08/17/2011   ALKPHOS 37* 08/17/2011   BILITOT 0.6 08/17/2011   Lab Results  Component Value Date   CHOL 123 08/17/2011   Lab Results  Component Value Date   HDL 51.80 08/17/2011   Lab Results  Component Value  Date   LDLCALC 41 08/17/2011   Lab Results  Component Value Date   TRIG 149.0 08/17/2011   Lab Results  Component Value Date   CHOLHDL 2 08/17/2011     Assessment & Plan  Anemia Has had to restart her Aranesp shots recently and does note she feels a little less steady on her feet when her anemia starts to resurface, no falls or syncope. She is reminded to stay well hydrated and to arise slowly from a sitting or lying position  HYPERTENSION, UNSPECIFIED Adequately controlled, no changes  Cataract extraction status of right eye Has recently been told she may need surgery and does note she has been straining more and having occasional fleeting pains in the muscles of the forehead above the eye, may apply warm compresses and topical ointments, report if symptoms worsen  Sinusitis Symptoms essentially resolved  Diabetes mellitus Check hgba1c with blood draw, avoid simple carbs  Anxiety and depression Doing well on low dose Paroxetine, no changes today

## 2011-08-18 NOTE — Assessment & Plan Note (Signed)
Has had to restart her Aranesp shots recently and does note she feels a little less steady on her feet when her anemia starts to resurface, no falls or syncope. She is reminded to stay well hydrated and to arise slowly from a sitting or lying position

## 2011-08-31 ENCOUNTER — Other Ambulatory Visit: Payer: Self-pay | Admitting: Nurse Practitioner

## 2011-08-31 ENCOUNTER — Other Ambulatory Visit (HOSPITAL_BASED_OUTPATIENT_CLINIC_OR_DEPARTMENT_OTHER): Payer: Medicare Other | Admitting: Lab

## 2011-08-31 ENCOUNTER — Ambulatory Visit (HOSPITAL_BASED_OUTPATIENT_CLINIC_OR_DEPARTMENT_OTHER): Payer: Medicare Other

## 2011-08-31 VITALS — BP 150/79 | HR 91 | Temp 97.8°F

## 2011-08-31 DIAGNOSIS — D469 Myelodysplastic syndrome, unspecified: Secondary | ICD-10-CM

## 2011-08-31 LAB — CBC WITH DIFFERENTIAL/PLATELET
BASO%: 0.5 % (ref 0.0–2.0)
Eosinophils Absolute: 0.6 10*3/uL — ABNORMAL HIGH (ref 0.0–0.5)
HCT: 32 % — ABNORMAL LOW (ref 34.8–46.6)
HGB: 10.4 g/dL — ABNORMAL LOW (ref 11.6–15.9)
MCHC: 32.3 g/dL (ref 31.5–36.0)
MONO#: 0.7 10*3/uL (ref 0.1–0.9)
NEUT#: 5.3 10*3/uL (ref 1.5–6.5)
NEUT%: 67.7 % (ref 38.4–76.8)
WBC: 7.8 10*3/uL (ref 3.9–10.3)
lymph#: 1.3 10*3/uL (ref 0.9–3.3)

## 2011-08-31 MED ORDER — DARBEPOETIN ALFA-POLYSORBATE 300 MCG/0.6ML IJ SOLN
300.0000 ug | Freq: Once | INTRAMUSCULAR | Status: AC
  Administered 2011-08-31: 300 ug via SUBCUTANEOUS
  Filled 2011-08-31: qty 0.6

## 2011-09-21 ENCOUNTER — Ambulatory Visit (HOSPITAL_BASED_OUTPATIENT_CLINIC_OR_DEPARTMENT_OTHER): Payer: Medicare Other

## 2011-09-21 ENCOUNTER — Other Ambulatory Visit (HOSPITAL_BASED_OUTPATIENT_CLINIC_OR_DEPARTMENT_OTHER): Payer: Medicare Other | Admitting: Lab

## 2011-09-21 VITALS — BP 130/72 | HR 87 | Temp 97.4°F

## 2011-09-21 DIAGNOSIS — D469 Myelodysplastic syndrome, unspecified: Secondary | ICD-10-CM

## 2011-09-21 LAB — CBC WITH DIFFERENTIAL/PLATELET
Basophils Absolute: 0.1 10*3/uL (ref 0.0–0.1)
Eosinophils Absolute: 0.6 10*3/uL — ABNORMAL HIGH (ref 0.0–0.5)
HCT: 32.8 % — ABNORMAL LOW (ref 34.8–46.6)
HGB: 10.4 g/dL — ABNORMAL LOW (ref 11.6–15.9)
MCV: 98.8 fL (ref 79.5–101.0)
MONO%: 6.8 % (ref 0.0–14.0)
NEUT#: 6.9 10*3/uL — ABNORMAL HIGH (ref 1.5–6.5)
RDW: 16.2 % — ABNORMAL HIGH (ref 11.2–14.5)
lymph#: 1.3 10*3/uL (ref 0.9–3.3)

## 2011-09-21 MED ORDER — DARBEPOETIN ALFA-POLYSORBATE 300 MCG/0.6ML IJ SOLN
300.0000 ug | Freq: Once | INTRAMUSCULAR | Status: AC
  Administered 2011-09-21: 300 ug via SUBCUTANEOUS
  Filled 2011-09-21: qty 0.6

## 2011-10-05 ENCOUNTER — Encounter: Payer: Self-pay | Admitting: Internal Medicine

## 2011-10-05 ENCOUNTER — Ambulatory Visit (INDEPENDENT_AMBULATORY_CARE_PROVIDER_SITE_OTHER): Payer: Medicare Other | Admitting: Internal Medicine

## 2011-10-05 VITALS — BP 132/71 | HR 89 | Ht 60.0 in | Wt 112.0 lb

## 2011-10-05 DIAGNOSIS — I10 Essential (primary) hypertension: Secondary | ICD-10-CM

## 2011-10-05 DIAGNOSIS — I472 Ventricular tachycardia: Secondary | ICD-10-CM

## 2011-10-05 DIAGNOSIS — I2510 Atherosclerotic heart disease of native coronary artery without angina pectoris: Secondary | ICD-10-CM

## 2011-10-05 DIAGNOSIS — Z950 Presence of cardiac pacemaker: Secondary | ICD-10-CM | POA: Insufficient documentation

## 2011-10-05 NOTE — Progress Notes (Signed)
HPI  [name] is a 78 y.o. female followup for pacemaker implantation for symptomatic bradycardia with high-grade heart block and post  micturition syncope.  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 The site was in the left shoulder proximate to  her pacemaker site.   She has a history of CAD, s/p Taxus DES to the oD1 in 6/09 and residual 70% in prox right PL branch treated medically   8/12  Normal lexiscan myovue EF 70%  this was following an admission for chest pain August 2012. She now comes in complaining of discomfort in the infrascapular region with walking. She also complains of debilitating fatigue.  I should note that she has chronic kidney disease which has not been too bad and a myelodysplasia for which he gets recurrently transfused. Most recent hemoglobin 2 weeks ago was 10.4.       Past Medical History  Diagnosis Date  . CAD (coronary artery disease)   . PVD (peripheral vascular disease)   . Carotid artery stenosis   . HTN (hypertension)   . Hyperlipidemia   . Hypothyroidism   . Chronic renal failure   . Diabetes mellitus 07/16/2011  . Allergic state 07/16/2011  . Insomnia 07/16/2011  . Recurrent UTI 07/16/2011  . Asthma     childhood, recurrent at 74  . Myelodysplastic syndrome 04/02/2011    bladder cancer  . Bladder cancer 07/18/2011  . Sinusitis 07/18/2011  . Anemia 07/18/2011  . Arthritis 07/18/2011  . Cataract extraction status of right eye 08/18/2011  . Anxiety and depression 08/18/2011    Past Surgical History  Procedure Date  . Cholecystectomy   . Pcm - medtronic   . Abdominal hysterectomy   . Shoulder open rotator cuff repair   . Foot surgery     bunionectomy    Current Outpatient Prescriptions  Medication Sig Dispense Refill  . acetaminophen (TYLENOL) 500 MG tablet Take 500 mg by mouth every 6 (six) hours as needed.        Marland Kitchen amLODipine (NORVASC) 5 MG tablet Take 5 mg by mouth daily.        Marland Kitchen aspirin 81 MG tablet Take 81 mg by mouth  daily.        Marland Kitchen levothyroxine (SYNTHROID, LEVOTHROID) 112 MCG tablet Take 112 mcg by mouth daily.        . metFORMIN (GLUMETZA) 1000 MG (MOD) 24 hr tablet Take 1,000 mg by mouth 2 (two) times daily with a meal.        . Mometasone Furo-Formoterol Fum (DULERA) 200-5 MCG/ACT AERO Inhale into the lungs. Use as needed       . montelukast (SINGULAIR) 10 MG tablet as needed.        Marland Kitchen PARoxetine (PAXIL) 10 MG tablet Take 10 mg by mouth daily.        . rosuvastatin (CRESTOR) 20 MG tablet Take 20 mg by mouth daily.        . solifenacin (VESICARE) 5 MG tablet Take 10 mg by mouth daily.        Allergies  Allergen Reactions  . Codeine   . Penicillins   . Sulfonamide Derivatives     Review of Systems negative except from HPI and PMH  Physical Exam BP 132/71  Pulse 89  Ht 5' (1.524 m)  Wt 112 lb (50.803 kg)  BMI 21.87 kg/m2 Well developed and well nourished in no acute distress HENT normal E scleral and icterus clear Neck Supple JVP flat; carotids brisk and  full Clear to ausculation Regular rate and rhythm, no murmurs gallops or rub Soft with active bowel sounds No clubbing cyanosis none Edema Alert and oriented, grossly normal motor and sensory function Skin Warm and Dry    Assessment and  Plan

## 2011-10-05 NOTE — Assessment & Plan Note (Signed)
Blood pressure is relatively well controlled. I wonder though about whether the amlodipine is contributing to the progressive fatigue and she has noted over the last 3-6 months. We'll undertake a three-week elimination trial

## 2011-10-05 NOTE — Assessment & Plan Note (Signed)
The patient's device was interrogated.  The information was reviewed. No changes were made in the programming.    

## 2011-10-05 NOTE — Assessment & Plan Note (Signed)
Concern that her infrascapular chest discomfort is a ischemic equivalent. We will plan to undertaking Myoview. sHe had one done in August. I would have a low threshold for proceeding with catheterization event that were abnormal.

## 2011-10-05 NOTE — Patient Instructions (Signed)
Your physician wants you to follow-up in: 6 months with Kristin/ Gunnar Fusi for a device check. You will receive a reminder letter in the mail two months in advance. If you don't receive a letter, please call our office to schedule the follow-up appointment.  Your physician has recommended you make the following change in your medication:  1) hold amlodipine for 3 weeks to see how you feel.  Your physician has requested that you have a lexiscan myoview. For further information please visit [email or web address] Please follow instruction sheet, as given.

## 2011-10-06 ENCOUNTER — Telehealth: Payer: Self-pay

## 2011-10-06 ENCOUNTER — Other Ambulatory Visit: Payer: Self-pay | Admitting: Family Medicine

## 2011-10-06 LAB — PACEMAKER DEVICE OBSERVATION
AL THRESHOLD: 0.625 V
ATRIAL PACING PM: 38
BATTERY VOLTAGE: 2.79 V
VENTRICULAR PACING PM: 100

## 2011-10-07 MED ORDER — METFORMIN HCL ER (MOD) 1000 MG PO TB24: 1000.0000 mg | ORAL_TABLET | Freq: Two times a day (BID) | ORAL | Status: DC

## 2011-10-07 NOTE — Telephone Encounter (Signed)
RX sent

## 2011-10-08 NOTE — Telephone Encounter (Signed)
Error

## 2011-10-12 ENCOUNTER — Other Ambulatory Visit (HOSPITAL_BASED_OUTPATIENT_CLINIC_OR_DEPARTMENT_OTHER): Payer: Medicare Other | Admitting: Lab

## 2011-10-12 ENCOUNTER — Ambulatory Visit (HOSPITAL_BASED_OUTPATIENT_CLINIC_OR_DEPARTMENT_OTHER): Payer: Medicare Other

## 2011-10-12 VITALS — BP 133/72 | HR 91 | Temp 99.0°F

## 2011-10-12 DIAGNOSIS — D469 Myelodysplastic syndrome, unspecified: Secondary | ICD-10-CM

## 2011-10-12 LAB — CBC WITH DIFFERENTIAL/PLATELET
BASO%: 0.5 % (ref 0.0–2.0)
Eosinophils Absolute: 0.7 10*3/uL — ABNORMAL HIGH (ref 0.0–0.5)
HCT: 31.8 % — ABNORMAL LOW (ref 34.8–46.6)
MCHC: 32.3 g/dL (ref 31.5–36.0)
MONO#: 0.8 10*3/uL (ref 0.1–0.9)
NEUT#: 6.2 10*3/uL (ref 1.5–6.5)
NEUT%: 68 % (ref 38.4–76.8)
WBC: 9.2 10*3/uL (ref 3.9–10.3)
lymph#: 1.5 10*3/uL (ref 0.9–3.3)
nRBC: 0 % (ref 0–0)

## 2011-10-12 MED ORDER — DARBEPOETIN ALFA-POLYSORBATE 300 MCG/0.6ML IJ SOLN
300.0000 ug | Freq: Once | INTRAMUSCULAR | Status: AC
  Administered 2011-10-12: 300 ug via SUBCUTANEOUS
  Filled 2011-10-12: qty 0.6

## 2011-10-14 ENCOUNTER — Ambulatory Visit (HOSPITAL_COMMUNITY): Payer: Medicare Other | Attending: Internal Medicine | Admitting: Radiology

## 2011-10-14 VITALS — BP 119/77 | HR 60 | Ht 60.0 in | Wt 110.0 lb

## 2011-10-14 DIAGNOSIS — R5383 Other fatigue: Secondary | ICD-10-CM | POA: Insufficient documentation

## 2011-10-14 DIAGNOSIS — E119 Type 2 diabetes mellitus without complications: Secondary | ICD-10-CM | POA: Insufficient documentation

## 2011-10-14 DIAGNOSIS — R0602 Shortness of breath: Secondary | ICD-10-CM

## 2011-10-14 DIAGNOSIS — R110 Nausea: Secondary | ICD-10-CM | POA: Insufficient documentation

## 2011-10-14 DIAGNOSIS — I779 Disorder of arteries and arterioles, unspecified: Secondary | ICD-10-CM | POA: Insufficient documentation

## 2011-10-14 DIAGNOSIS — R0609 Other forms of dyspnea: Secondary | ICD-10-CM | POA: Insufficient documentation

## 2011-10-14 DIAGNOSIS — I472 Ventricular tachycardia: Secondary | ICD-10-CM

## 2011-10-14 DIAGNOSIS — R61 Generalized hyperhidrosis: Secondary | ICD-10-CM | POA: Insufficient documentation

## 2011-10-14 DIAGNOSIS — R5381 Other malaise: Secondary | ICD-10-CM | POA: Insufficient documentation

## 2011-10-14 DIAGNOSIS — R000 Tachycardia, unspecified: Secondary | ICD-10-CM | POA: Insufficient documentation

## 2011-10-14 DIAGNOSIS — I209 Angina pectoris, unspecified: Secondary | ICD-10-CM

## 2011-10-14 DIAGNOSIS — R42 Dizziness and giddiness: Secondary | ICD-10-CM | POA: Insufficient documentation

## 2011-10-14 DIAGNOSIS — I739 Peripheral vascular disease, unspecified: Secondary | ICD-10-CM | POA: Insufficient documentation

## 2011-10-14 DIAGNOSIS — E785 Hyperlipidemia, unspecified: Secondary | ICD-10-CM | POA: Insufficient documentation

## 2011-10-14 DIAGNOSIS — R55 Syncope and collapse: Secondary | ICD-10-CM | POA: Insufficient documentation

## 2011-10-14 DIAGNOSIS — I10 Essential (primary) hypertension: Secondary | ICD-10-CM | POA: Insufficient documentation

## 2011-10-14 DIAGNOSIS — I2510 Atherosclerotic heart disease of native coronary artery without angina pectoris: Secondary | ICD-10-CM | POA: Insufficient documentation

## 2011-10-14 DIAGNOSIS — R0989 Other specified symptoms and signs involving the circulatory and respiratory systems: Secondary | ICD-10-CM | POA: Insufficient documentation

## 2011-10-14 DIAGNOSIS — I447 Left bundle-branch block, unspecified: Secondary | ICD-10-CM

## 2011-10-14 MED ORDER — ADENOSINE (DIAGNOSTIC) 3 MG/ML IV SOLN
0.5600 mg/kg | Freq: Once | INTRAVENOUS | Status: AC
  Administered 2011-10-14: 27.9 mg via INTRAVENOUS

## 2011-10-14 MED ORDER — TECHNETIUM TC 99M TETROFOSMIN IV KIT
32.7000 | PACK | Freq: Once | INTRAVENOUS | Status: AC | PRN
  Administered 2011-10-14: 32.7 via INTRAVENOUS

## 2011-10-14 MED ORDER — TECHNETIUM TC 99M TETROFOSMIN IV KIT
10.8000 | PACK | Freq: Once | INTRAVENOUS | Status: AC | PRN
  Administered 2011-10-14: 11 via INTRAVENOUS

## 2011-10-14 NOTE — Progress Notes (Signed)
[facility] SITE 3 NUCLEAR MED [address] [place] [address] [phone]  Cardiology Nuclear Med Study  [name] is a 78 y.o. female     MRN : [id number]     DOB: 1924/03/03  Procedure Date: 10/14/2011  Nuclear Med Background Indication for Stress Test:  Evaluation for Ischemia and Stent Patency History:  '09 Stent-Dx with residual in PLA; 8/12 QMV:HQIONG, EF=70%; PTVP Cardiac Risk Factors: Carotid Disease, Hypertension, Lipids, NIDDM and PVD  Symptoms:  "Discomfort" Between Shoulder Blades with Walking; Diaphoresis, Dizziness, DOE, Fatigue, Fatigue with Exertion, Nausea, Near Syncope and Rapid HR    Nuclear Pre-Procedure Caffeine/Decaff Intake:  None > 12 hrs NPO After: 9:00pm   Lungs:  clear O2 Sat: 98% on room air. IV 0.9% NS with Angio Cath:  20g  IV Site: R Wrist x 1, tolerated well IV Started by:  Irean Hong, RN  Chest Size (in):  38 Cup Size: B  Height: 5' (1.524 m)  Weight:  110 lb (49.896 kg)  BMI:  Body mass index is 21.48 kg/(m^2). Tech Comments:  n/a    Nuclear Med Study 1 or 2 day study: 1 day  Stress Test Type:  Adenosine  Reading MD: Charlton Haws, MD  Order Authorizing Provider:  Sherryl Manges, MD  Resting Radionuclide: Technetium 71m Tetrofosmin  Resting Radionuclide Dose: 10.8 mCi   Stress Radionuclide:  Technetium 25m Tetrofosmin  Stress Radionuclide Dose: 32.7 mCi           Stress Protocol Rest HR: 60 Stress HR: 77  Rest BP: 119/77 Stress BP: 174/81  Exercise Time (min): n/a METS: n/a   Predicted Max HR: 135 bpm % Max HR: 57.04 bpm Rate Pressure Product: 29528   Dose of Adenosine (mg):  28.0 Dose of Lexiscan: n/a mg  Dose of Atropine (mg): n/a Dose of Dobutamine: n/a mcg/kg/min (at max HR)  Stress Test Technologist: Smiley Houseman, CMA-N  Nuclear Technologist:  Domenic Polite, CNMT     Rest Procedure:  Myocardial perfusion imaging was performed at rest 45 minutes following the intravenous administration of  Technetium 25m Tetrofosmin.  Rest ECG: AV Paced-LBBB.  Stress Procedure:  The patient received IV adenosine at 140 mcg/kg/min for 4 minutes.  There were no significant changes with infusion.  She did c/o chest tightness, 10/10, with infusion.  Technetium 58m Tetrofosmin was injected at the 2 minute mark and quantitative spect images were obtained after a 45 minute delay.  Stress ECG: P synchronous pacing  QPS Raw Data Images:  Normal; no motion artifact; normal heart/lung ratio. Stress Images:  Normal homogeneous uptake in all areas of the myocardium. Rest Images:  Normal homogeneous uptake in all areas of the myocardium. Subtraction (SDS):  Normal Transient Ischemic Dilatation (Normal <1.22):  .99 Lung/Heart Ratio (Normal <0.45):  .35  Quantitative Gated Spect Images QGS EDV:  56 ml QGS ESV:  17 ml  Impression Exercise Capacity:  Adenosine study with no exercise. BP Response:  Normal blood pressure response. Clinical Symptoms:  Atypical chest pain. ECG Impression:  No significant ST segment change suggestive of ischemia. Comparison with Prior Nuclear Study: No images to compare  Overall Impression:  Normal stress nuclear study.  LV Ejection Fraction: 69%.  LV Wall Motion:  NL LV Function; NL Wall Motion  Charlton Haws

## 2011-11-02 ENCOUNTER — Telehealth: Payer: Self-pay | Admitting: Oncology

## 2011-11-02 ENCOUNTER — Ambulatory Visit (HOSPITAL_BASED_OUTPATIENT_CLINIC_OR_DEPARTMENT_OTHER): Payer: Medicare Other | Admitting: Oncology

## 2011-11-02 ENCOUNTER — Other Ambulatory Visit (HOSPITAL_BASED_OUTPATIENT_CLINIC_OR_DEPARTMENT_OTHER): Payer: Medicare Other | Admitting: Lab

## 2011-11-02 VITALS — BP 141/82 | HR 84 | Temp 97.0°F | Ht 60.0 in | Wt 111.6 lb

## 2011-11-02 DIAGNOSIS — I2510 Atherosclerotic heart disease of native coronary artery without angina pectoris: Secondary | ICD-10-CM

## 2011-11-02 DIAGNOSIS — D4620 Refractory anemia with excess of blasts, unspecified: Secondary | ICD-10-CM

## 2011-11-02 DIAGNOSIS — N289 Disorder of kidney and ureter, unspecified: Secondary | ICD-10-CM

## 2011-11-02 DIAGNOSIS — D469 Myelodysplastic syndrome, unspecified: Secondary | ICD-10-CM

## 2011-11-02 DIAGNOSIS — E039 Hypothyroidism, unspecified: Secondary | ICD-10-CM

## 2011-11-02 LAB — COMPREHENSIVE METABOLIC PANEL
AST: 20 U/L (ref 0–37)
Albumin: 4.3 g/dL (ref 3.5–5.2)
BUN: 15 mg/dL (ref 6–23)
Calcium: 9.6 mg/dL (ref 8.4–10.5)
Chloride: 103 mEq/L (ref 96–112)
Glucose, Bld: 82 mg/dL (ref 70–99)
Potassium: 4.2 mEq/L (ref 3.5–5.3)

## 2011-11-02 LAB — CBC WITH DIFFERENTIAL/PLATELET
BASO%: 0.6 % (ref 0.0–2.0)
Eosinophils Absolute: 0.3 10*3/uL (ref 0.0–0.5)
MCV: 95.7 fL (ref 79.5–101.0)
MONO%: 7.6 % (ref 0.0–14.0)
NEUT#: 5.3 10*3/uL (ref 1.5–6.5)
RBC: 3.49 10*6/uL — ABNORMAL LOW (ref 3.70–5.45)
RDW: 17.3 % — ABNORMAL HIGH (ref 11.2–14.5)
WBC: 7.5 10*3/uL (ref 3.9–10.3)

## 2011-11-02 NOTE — Progress Notes (Signed)
Hematology and Oncology Follow Up Visit  [name] [id number] 06-Oct-1923 78 y.o. 11/02/2011 2:35 PM   Principle Diagnosis: Encounter Diagnosis  Name Primary?  . MDS (myelodysplastic syndrome), low grade Yes     Interim History:    A follow-up visit for this 78 year old woman initially evaluated for a moderate microcytic anemia and mild thrombocytopenia back in July 2009. She had a bone marrow aspiration & biopsy on November 23, 2007.   The bone marrow was slightly hypercellular for  age, estimated at 40-50%.  There was normal maturation in all three-cell lines, with some mild megaloblastic changes in the erythroid precursors.  No excess blasts,  in fact, zero blasts were seen on a 500-cell differential.  No excess plasma cells, which were only 1%. Storage iron was decreased,  there were no ringed sideroblasts.  She had normal B12 and folic acid levels.  Cytogenetic studies normal. I thought she had a low risk myelodysplastic syndrome. She does have mild chronic renal insufficiency which may also be impacting on her anemia. She's been on intermittent Aranesp injections every 3 weeks to maintain hemoglobin 10 g or above. Overall the program has been successful except when she has acute medical illness that causes a fall in her hemoglobin lower than her baseline.  She has underlying coronary artery disease and is status post coronary stent placement in 2009. She had a pacemaker put him in the past. She had a recent cardiac evaluation including a pacemaker check and a nuclear medicine  test through Dr. Clayborne Artist office in May and everything turned out normal. Estimated left ventricular ejection fraction 89% with normal wall motion.  She has been more unsteady on her feet. She fell down a few weeks ago and fell flat on her face and not got some teeth.  She is due for cataract surgery in July and August. Medications: reviewed  Allergies:  Allergies  Allergen Reactions  . Codeine   .  Penicillins   . Sulfonamide Derivatives     Review of Systems: Constitutional:   Intermittent fatigue. Respiratory: Dyspnea on exertion. Cardiovascular:  No ischemic type chest pain or palpitations Gastrointestinal: No change in bowel habit Genito-Urinary: No urinary tract symptoms Musculoskeletal: No muscle or bone pain Neurologic: No headache or change in vision Skin: No rash Remaining ROS negative.  Physical Exam: Blood pressure 141/82, pulse 84, temperature 97 F (36.1 C), temperature source Oral, height 5' (1.524 m), weight 111 lb 9.6 oz (50.621 kg). Wt Readings from Last 3 Encounters:  11/02/11 111 lb 9.6 oz (50.621 kg)  10/14/11 110 lb (49.896 kg)  10/05/11 112 lb (50.803 kg)     General appearance: Thin Caucasian woman HENNT: Pharynx no erythema or exudate Lymph nodes: No adenopathy Breasts: Not examined Lungs: Clear to auscultation resonant to percussion Heart: Regular rhythm Abdomen: Soft nontender no mass no organomegaly Extremities: No edema no calf tenderness venous stasis changes Vascular: No cyanosis Neurologic: Mental status intact, pupils equal round reactive to light, motor strength 5 over 5, reflexes 1+ symmetric. Gait not tested but she is ambulating without a cane or walker Skin: Scattered small ecchymoses on her arm. Skin is very thin  Lab Results: Lab Results  Component Value Date   WBC 7.5 11/02/2011   HGB 10.7* 11/02/2011   HCT 33.4* 11/02/2011   MCV 95.7 11/02/2011   PLT 131* 11/02/2011     Chemistry      Component Value Date/Time   NA 139 08/17/2011 1024   K 4.2 08/17/2011  1024   CL 100 08/17/2011 1024   CO2 29 08/17/2011 1024   BUN 25* 08/17/2011 1024   CREATININE 1.3* 08/17/2011 1024      Component Value Date/Time   CALCIUM 9.3 08/17/2011 1024   ALKPHOS 37* 08/17/2011 1024   AST 22 08/17/2011 1024   ALT 15 08/17/2011 1024   BILITOT 0.6 08/17/2011 1024       Radiological Studies: No results found.  Impression and Plan: #1. Low risk myelodysplastic  syndrome. Hemoglobin stable at or above 10 g on intermittent Aranesp injections. Plan: Continue the same  #2. Mild chronic renal insufficiency  #3. Coronary artery disease with history of a third-degree heart block requiring permanent pacemaker and status post drug-eluting stent to the left anterior descending coronary artery.  #4. Hypothyroid on replacement  #5. Hyperlipidemia  #6. Essential hypertension  #7. Type 2 diabetes   CC:. Dr. Joaquin Courts Dr. Charlton Haws   Levert Feinstein, MD 6/18/20132:35 PM

## 2011-11-02 NOTE — Telephone Encounter (Signed)
gve the pt her July-dec 2013 appt calendar °

## 2011-11-15 ENCOUNTER — Other Ambulatory Visit: Payer: Medicare Other

## 2011-11-15 ENCOUNTER — Ambulatory Visit: Payer: Medicare Other | Admitting: Oncology

## 2011-11-23 ENCOUNTER — Other Ambulatory Visit (HOSPITAL_BASED_OUTPATIENT_CLINIC_OR_DEPARTMENT_OTHER): Payer: Medicare Other | Admitting: Lab

## 2011-11-23 ENCOUNTER — Ambulatory Visit (HOSPITAL_BASED_OUTPATIENT_CLINIC_OR_DEPARTMENT_OTHER): Payer: Medicare Other

## 2011-11-23 VITALS — BP 129/74 | HR 79 | Temp 99.0°F

## 2011-11-23 DIAGNOSIS — D4620 Refractory anemia with excess of blasts, unspecified: Secondary | ICD-10-CM

## 2011-11-23 DIAGNOSIS — D469 Myelodysplastic syndrome, unspecified: Secondary | ICD-10-CM

## 2011-11-23 LAB — CBC WITH DIFFERENTIAL/PLATELET
BASO%: 0.4 % (ref 0.0–2.0)
Basophils Absolute: 0 10*3/uL (ref 0.0–0.1)
EOS%: 6.1 % (ref 0.0–7.0)
Eosinophils Absolute: 0.5 10*3/uL (ref 0.0–0.5)
HCT: 33.3 % — ABNORMAL LOW (ref 34.8–46.6)
HGB: 10.7 g/dL — ABNORMAL LOW (ref 11.6–15.9)
LYMPH%: 14.5 % (ref 14.0–49.7)
MCH: 31.2 pg (ref 25.1–34.0)
MCHC: 32.1 g/dL (ref 31.5–36.0)
MCV: 97.2 fL (ref 79.5–101.0)
MONO#: 0.6 10*3/uL (ref 0.1–0.9)
MONO%: 7.4 % (ref 0.0–14.0)
NEUT#: 6.1 10*3/uL (ref 1.5–6.5)
NEUT%: 71.6 % (ref 38.4–76.8)
Platelets: 111 10*3/uL — ABNORMAL LOW (ref 145–400)
RBC: 3.42 10*6/uL — ABNORMAL LOW (ref 3.70–5.45)
RDW: 18.9 % — ABNORMAL HIGH (ref 11.2–14.5)
WBC: 8.5 10*3/uL (ref 3.9–10.3)
lymph#: 1.2 10*3/uL (ref 0.9–3.3)

## 2011-11-23 MED ORDER — DARBEPOETIN ALFA-POLYSORBATE 300 MCG/0.6ML IJ SOLN
300.0000 ug | Freq: Once | INTRAMUSCULAR | Status: AC
  Administered 2011-11-23: 300 ug via SUBCUTANEOUS
  Filled 2011-11-23: qty 0.6

## 2011-12-13 ENCOUNTER — Telehealth: Payer: Self-pay | Admitting: Oncology

## 2011-12-13 NOTE — Telephone Encounter (Signed)
Pt called today to r/s 7/30 lb/inj to 8/1.

## 2011-12-14 ENCOUNTER — Ambulatory Visit: Payer: Medicare Other

## 2011-12-14 ENCOUNTER — Other Ambulatory Visit: Payer: Medicare Other | Admitting: Lab

## 2011-12-16 ENCOUNTER — Other Ambulatory Visit (HOSPITAL_BASED_OUTPATIENT_CLINIC_OR_DEPARTMENT_OTHER): Payer: Medicare Other | Admitting: Lab

## 2011-12-16 ENCOUNTER — Ambulatory Visit (HOSPITAL_BASED_OUTPATIENT_CLINIC_OR_DEPARTMENT_OTHER): Payer: Medicare Other

## 2011-12-16 VITALS — BP 130/72 | HR 79 | Temp 97.5°F

## 2011-12-16 DIAGNOSIS — D469 Myelodysplastic syndrome, unspecified: Secondary | ICD-10-CM

## 2011-12-16 LAB — CBC WITH DIFFERENTIAL/PLATELET
EOS%: 9.5 % — ABNORMAL HIGH (ref 0.0–7.0)
Eosinophils Absolute: 0.7 10*3/uL — ABNORMAL HIGH (ref 0.0–0.5)
HGB: 10.4 g/dL — ABNORMAL LOW (ref 11.6–15.9)
MCH: 31.9 pg (ref 25.1–34.0)
MCV: 99.2 fL (ref 79.5–101.0)
MONO%: 8.9 % (ref 0.0–14.0)
NEUT#: 5.1 10*3/uL (ref 1.5–6.5)
RBC: 3.26 10*6/uL — ABNORMAL LOW (ref 3.70–5.45)
RDW: 18.2 % — ABNORMAL HIGH (ref 11.2–14.5)
lymph#: 1.2 10*3/uL (ref 0.9–3.3)

## 2011-12-16 MED ORDER — DARBEPOETIN ALFA-POLYSORBATE 300 MCG/0.6ML IJ SOLN
300.0000 ug | Freq: Once | INTRAMUSCULAR | Status: AC
  Administered 2011-12-16: 300 ug via SUBCUTANEOUS

## 2011-12-17 ENCOUNTER — Encounter: Payer: Self-pay | Admitting: Family Medicine

## 2011-12-17 ENCOUNTER — Ambulatory Visit (INDEPENDENT_AMBULATORY_CARE_PROVIDER_SITE_OTHER)
Admission: RE | Admit: 2011-12-17 | Discharge: 2011-12-17 | Disposition: A | Discharge status: Home / Self Care | Payer: Medicare Other | Source: Ambulatory Visit | Attending: Family Medicine | Admitting: Family Medicine

## 2011-12-17 ENCOUNTER — Ambulatory Visit (INDEPENDENT_AMBULATORY_CARE_PROVIDER_SITE_OTHER): Payer: Medicare Other | Admitting: Family Medicine

## 2011-12-17 VITALS — BP 151/77 | HR 79 | Temp 97.7°F | Ht 60.0 in | Wt 110.8 lb

## 2011-12-17 DIAGNOSIS — N3281 Overactive bladder: Secondary | ICD-10-CM

## 2011-12-17 DIAGNOSIS — J40 Bronchitis, not specified as acute or chronic: Secondary | ICD-10-CM

## 2011-12-17 DIAGNOSIS — J209 Acute bronchitis, unspecified: Secondary | ICD-10-CM

## 2011-12-17 DIAGNOSIS — N318 Other neuromuscular dysfunction of bladder: Secondary | ICD-10-CM

## 2011-12-17 DIAGNOSIS — I10 Essential (primary) hypertension: Secondary | ICD-10-CM

## 2011-12-17 DIAGNOSIS — R319 Hematuria, unspecified: Secondary | ICD-10-CM

## 2011-12-17 DIAGNOSIS — N390 Urinary tract infection, site not specified: Secondary | ICD-10-CM

## 2011-12-17 LAB — POCT URINALYSIS DIPSTICK
Glucose, UA: NEGATIVE
Protein, UA: 30
Urobilinogen, UA: 0.2

## 2011-12-17 MED ORDER — CIPROFLOXACIN HCL 250 MG PO TABS: 250.0000 mg | ORAL_TABLET | Freq: Two times a day (BID) | ORAL | Status: AC

## 2011-12-17 MED ORDER — FESOTERODINE FUMARATE ER 4 MG PO TB24: 4.0000 mg | ORAL_TABLET | Freq: Every day | ORAL | Status: DC

## 2011-12-17 MED ORDER — HYDROCOD POLST-CHLORPHEN POLST 10-8 MG/5ML PO LQCR: 5.0000 mL | Freq: Two times a day (BID) | ORAL | Status: DC

## 2011-12-17 MED ORDER — GUAIFENESIN ER 600 MG PO TB12: 1200.0000 mg | ORAL_TABLET | Freq: Two times a day (BID) | ORAL | Status: DC

## 2011-12-17 NOTE — Patient Instructions (Addendum)

## 2011-12-20 ENCOUNTER — Encounter: Payer: Self-pay | Admitting: Family Medicine

## 2011-12-20 DIAGNOSIS — J209 Acute bronchitis, unspecified: Secondary | ICD-10-CM | POA: Insufficient documentation

## 2011-12-20 HISTORY — DX: Acute bronchitis, unspecified: J20.9

## 2011-12-20 LAB — URINE CULTURE: Colony Count: >=100000

## 2011-12-20 NOTE — Progress Notes (Signed)
Patient ID: [name], female   DOB: June 19, 1923, 78 y.o.   MRN: [id number] [name] [id number] 1923-07-26 12/20/2011      Progress Note-Follow Up  Subjective  Chief Complaint  Chief Complaint  Patient presents with  . Cough    X 2 weeks,deep cough- hurts in shoulder blades    HPI  Patient is an 78 year old Caucasian female who is in today complaining of 2 weeks worth of worsening congestion. She reports the cough is causing her pain between her shoulder blades. As a sharp pain in results of that she's coughing. She denies shortness or breath or anterior chest pain. She denies palpitations but she does note fevers chills and anorexia are present. She had some intermittent sharp fleeting headaches with coughing as well. No ear pain or sore throat. She does feel weak and woozy. Describes himself as feeling little off-balance. Does have urinary frequency but this is not notably worse. Denies dysuria. Is scheduled to have cataract surgery on 12/20/2011 is hoping to get this taken care of. Had right eye cataract surgery last month and  Past Medical History  Diagnosis Date  . CAD (coronary artery disease)   . PVD (peripheral vascular disease)   . Carotid artery stenosis   . HTN (hypertension)   . Hyperlipidemia   . Hypothyroidism   . Chronic renal failure   . Diabetes mellitus 07/16/2011  . Allergic state 07/16/2011  . Insomnia 07/16/2011  . Recurrent UTI 07/16/2011  . Asthma     childhood, recurrent at 67  . Myelodysplastic syndrome 04/02/2011    bladder cancer  . Bladder cancer 07/18/2011  . Sinusitis 07/18/2011  . Anemia 07/18/2011  . Arthritis 07/18/2011  . Cataract extraction status of right eye 08/18/2011  . Anxiety and depression 08/18/2011  . Bronchitis, acute 12/20/2011    Past Surgical History  Procedure Date  . Cholecystectomy   . Pcm - medtronic   . Abdominal hysterectomy   . Shoulder open rotator cuff repair   . Foot surgery     bunionectomy    Family History  Problem  Relation Age of Onset  . Heart disease Mother     chf  . Arthritis Mother   . Arthritis Father   . Kidney disease Father   . Hypertension Father   . Cancer Brother     lung  . Arthritis Daughter     MVA with very serious injuries    History   Social History  . Marital Status: Widowed    Spouse Name: N/A    Number of Children: N/A  . Years of Education: N/A   Occupational History  . Not on file.   Social History Main Topics  . Smoking status: Never Smoker   . Smokeless tobacco: Never Used  . Alcohol Use: No  . Drug Use: No  . Sexually Active: No   Other Topics Concern  . Not on file   Social History Narrative  . No narrative on file    Current Outpatient Prescriptions on File Prior to Visit  Medication Sig Dispense Refill  . acetaminophen (TYLENOL) 500 MG tablet Take 500 mg by mouth every 6 (six) hours as needed.        Marland Kitchen amLODipine (NORVASC) 5 MG tablet Take 5 mg by mouth daily.        Marland Kitchen aspirin 81 MG tablet Take 81 mg by mouth daily.        Marland Kitchen levothyroxine (SYNTHROID, LEVOTHROID) 112 MCG tablet Take 112  mcg by mouth daily.        . metFORMIN (GLUMETZA) 1000 MG (MOD) 24 hr tablet Take 1 tablet (1,000 mg total) by mouth 2 (two) times daily with a meal.  60 tablet  4  . Mometasone Furo-Formoterol Fum (DULERA) 200-5 MCG/ACT AERO Inhale into the lungs. Use as needed       . PARoxetine (PAXIL) 10 MG tablet Take 10 mg by mouth daily.        . rosuvastatin (CRESTOR) 20 MG tablet Take 20 mg by mouth daily.        . fesoterodine (TOVIAZ) 4 MG TB24 Take 1 tablet (4 mg total) by mouth daily.  30 tablet  0  . montelukast (SINGULAIR) 10 MG tablet as needed.          Allergies  Allergen Reactions  . Codeine   . Penicillins   . Sulfonamide Derivatives     Review of Systems  Review of Systems  Constitutional: Positive for fever, chills and malaise/fatigue.  HENT: Negative for congestion.   Eyes: Negative for discharge.  Respiratory: Positive for cough, sputum  production and shortness of breath.   Cardiovascular: Positive for chest pain. Negative for palpitations and leg swelling.       Sharp in back between shoulder blades only with coughing  Gastrointestinal: Negative for nausea, abdominal pain and diarrhea.  Genitourinary: Negative for dysuria.  Musculoskeletal: Negative for falls.  Skin: Negative for rash.  Neurological: Negative for loss of consciousness and headaches.  Endo/Heme/Allergies: Negative for polydipsia.  Psychiatric/Behavioral: Negative for depression and suicidal ideas. The patient is not nervous/anxious and does not have insomnia.     Objective  BP 151/77  Pulse 79  Temp 97.7 F (36.5 C) (Temporal)  Ht 5' (1.524 m)  Wt 110 lb 12.8 oz (50.259 kg)  BMI 21.64 kg/m2  SpO2 91%  Physical Exam  Physical Exam  Constitutional: She is oriented to person, place, and time and well-developed, well-nourished, and in no distress. No distress.  HENT:  Head: Normocephalic and atraumatic.       Oropharynx is erythematous  Eyes: Conjunctivae are normal.  Neck: Neck supple. No thyromegaly present.  Cardiovascular: Normal rate, regular rhythm and normal heart sounds.   Pulmonary/Chest: Effort normal and breath sounds normal. She has no wheezes.  Abdominal: She exhibits no distension and no mass.  Musculoskeletal: She exhibits no edema.  Lymphadenopathy:    She has no cervical adenopathy.  Neurological: She is alert and oriented to person, place, and time.  Skin: Skin is warm and dry. No rash noted. She is not diaphoretic.  Psychiatric: Memory, affect and judgment normal.    Lab Results  Component Value Date   TSH 0.13* 08/17/2011   Lab Results  Component Value Date   WBC 7.8 12/16/2011   HGB 10.4* 12/16/2011   HCT 32.4* 12/16/2011   MCV 99.2 12/16/2011   PLT 117* 12/16/2011   Lab Results  Component Value Date   CREATININE 1.19* 11/02/2011   BUN 15 11/02/2011   NA 141 11/02/2011   K 4.2 11/02/2011   CL 103 11/02/2011   CO2 29  11/02/2011   Lab Results  Component Value Date   ALT 14 11/02/2011   AST 20 11/02/2011   ALKPHOS 42 11/02/2011   BILITOT 0.4 11/02/2011   Lab Results  Component Value Date   CHOL 123 08/17/2011   Lab Results  Component Value Date   HDL 51.80 08/17/2011   Lab Results  Component Value Date  LDLCALC 41 08/17/2011   Lab Results  Component Value Date   TRIG 149.0 08/17/2011   Lab Results  Component Value Date   CHOLHDL 2 08/17/2011     Assessment & Plan  Recurrent UTI Urine is infected again, started on antibiotics today  HYPERTENSION, UNSPECIFIED Mild elevation with acute illness, will recheck at next visit.  Bronchitis, acute Started on antibiotics, mucinex and Tussionex, report if no improvement

## 2011-12-20 NOTE — Assessment & Plan Note (Signed)
Started on antibiotics, mucinex and Tussionex, report if no improvement

## 2011-12-20 NOTE — Assessment & Plan Note (Signed)
Urine is infected again, started on antibiotics today

## 2011-12-20 NOTE — Assessment & Plan Note (Signed)
Mild elevation with acute illness, will recheck at next visit 

## 2011-12-21 NOTE — Progress Notes (Signed)
Quick Note:  Patient Informed and voiced understanding. Pt states she feels better  ______

## 2012-01-04 ENCOUNTER — Other Ambulatory Visit (HOSPITAL_BASED_OUTPATIENT_CLINIC_OR_DEPARTMENT_OTHER): Payer: Medicare Other | Admitting: Lab

## 2012-01-04 ENCOUNTER — Ambulatory Visit (HOSPITAL_BASED_OUTPATIENT_CLINIC_OR_DEPARTMENT_OTHER): Payer: Medicare Other

## 2012-01-04 VITALS — BP 160/71 | HR 71 | Temp 98.6°F

## 2012-01-04 DIAGNOSIS — D469 Myelodysplastic syndrome, unspecified: Secondary | ICD-10-CM

## 2012-01-04 LAB — CBC WITH DIFFERENTIAL/PLATELET
Basophils Absolute: 0 10*3/uL (ref 0.0–0.1)
EOS%: 4.9 % (ref 0.0–7.0)
HGB: 10.5 g/dL — ABNORMAL LOW (ref 11.6–15.9)
MCH: 31.7 pg (ref 25.1–34.0)
MCV: 99.1 fL (ref 79.5–101.0)
MONO%: 8.6 % (ref 0.0–14.0)
RBC: 3.3 10*6/uL — ABNORMAL LOW (ref 3.70–5.45)
RDW: 17.4 % — ABNORMAL HIGH (ref 11.2–14.5)

## 2012-01-04 MED ORDER — DARBEPOETIN ALFA-POLYSORBATE 300 MCG/0.6ML IJ SOLN
300.0000 ug | Freq: Once | INTRAMUSCULAR | Status: AC
  Administered 2012-01-04: 300 ug via SUBCUTANEOUS
  Filled 2012-01-04: qty 0.6

## 2012-01-25 ENCOUNTER — Ambulatory Visit (HOSPITAL_BASED_OUTPATIENT_CLINIC_OR_DEPARTMENT_OTHER): Payer: Medicare Other

## 2012-01-25 ENCOUNTER — Other Ambulatory Visit (HOSPITAL_BASED_OUTPATIENT_CLINIC_OR_DEPARTMENT_OTHER): Payer: Medicare Other | Admitting: Lab

## 2012-01-25 VITALS — BP 130/72 | HR 86 | Temp 97.1°F | Wt 109.2 lb

## 2012-01-25 DIAGNOSIS — D469 Myelodysplastic syndrome, unspecified: Secondary | ICD-10-CM

## 2012-01-25 DIAGNOSIS — D4620 Refractory anemia with excess of blasts, unspecified: Secondary | ICD-10-CM

## 2012-01-25 LAB — CBC WITH DIFFERENTIAL/PLATELET
BASO%: 0.3 % (ref 0.0–2.0)
EOS%: 6.2 % (ref 0.0–7.0)
Eosinophils Absolute: 0.5 10*3/uL (ref 0.0–0.5)
MCH: 30.8 pg (ref 25.1–34.0)
MCV: 95.7 fL (ref 79.5–101.0)
MONO%: 9 % (ref 0.0–14.0)
NEUT#: 5.1 10*3/uL (ref 1.5–6.5)
RBC: 3.25 10*6/uL — ABNORMAL LOW (ref 3.70–5.45)
RDW: 15.6 % — ABNORMAL HIGH (ref 11.2–14.5)
nRBC: 0 % (ref 0–0)

## 2012-01-25 MED ORDER — DARBEPOETIN ALFA-POLYSORBATE 300 MCG/0.6ML IJ SOLN
300.0000 ug | Freq: Once | INTRAMUSCULAR | Status: AC
  Administered 2012-01-25: 300 ug via SUBCUTANEOUS
  Filled 2012-01-25: qty 0.6

## 2012-02-08 ENCOUNTER — Ambulatory Visit (INDEPENDENT_AMBULATORY_CARE_PROVIDER_SITE_OTHER): Payer: Medicare Other | Admitting: Family Medicine

## 2012-02-08 ENCOUNTER — Encounter: Payer: Self-pay | Admitting: Family Medicine

## 2012-02-08 VITALS — BP 163/95 | HR 92 | Temp 97.5°F | Ht 60.0 in | Wt 109.8 lb

## 2012-02-08 DIAGNOSIS — Z23 Encounter for immunization: Secondary | ICD-10-CM

## 2012-02-08 DIAGNOSIS — R319 Hematuria, unspecified: Secondary | ICD-10-CM

## 2012-02-08 DIAGNOSIS — E119 Type 2 diabetes mellitus without complications: Secondary | ICD-10-CM

## 2012-02-08 DIAGNOSIS — R5381 Other malaise: Secondary | ICD-10-CM

## 2012-02-08 DIAGNOSIS — N189 Chronic kidney disease, unspecified: Secondary | ICD-10-CM

## 2012-02-08 DIAGNOSIS — C679 Malignant neoplasm of bladder, unspecified: Secondary | ICD-10-CM

## 2012-02-08 DIAGNOSIS — I10 Essential (primary) hypertension: Secondary | ICD-10-CM

## 2012-02-08 DIAGNOSIS — N390 Urinary tract infection, site not specified: Secondary | ICD-10-CM

## 2012-02-08 DIAGNOSIS — J209 Acute bronchitis, unspecified: Secondary | ICD-10-CM

## 2012-02-08 LAB — POCT URINALYSIS DIPSTICK
Nitrite, UA: NEGATIVE
Protein, UA: 100
Urobilinogen, UA: 0.2
pH, UA: 5.5

## 2012-02-08 LAB — GLUCOSE, POCT (MANUAL RESULT ENTRY): POC Glucose: 143 mg/dl — AB (ref 70–99)

## 2012-02-08 MED ORDER — ALIGN 4 MG PO CAPS: 1.0000 | ORAL_CAPSULE | Freq: Every day | ORAL | Status: DC

## 2012-02-08 MED ORDER — CIPROFLOXACIN HCL 250 MG PO TABS: 250.0000 mg | ORAL_TABLET | Freq: Two times a day (BID) | ORAL | Status: DC

## 2012-02-08 NOTE — Assessment & Plan Note (Addendum)
Improved with 150/88 on recheck likely elevated due to acute illness. Will recheck in 3 weeks when patient is feeling better before adjusting med futher.

## 2012-02-08 NOTE — Assessment & Plan Note (Signed)
Sugar up some with acute illness, 143 today, will continue to monitor and recheck at next visit, needs to minimize simple carbs, continue same meds

## 2012-02-08 NOTE — Assessment & Plan Note (Signed)
Appears to have recurred again. Started on Ciprofloxacin 250 mg po bid and urine sent for culture. Encouraged probiotics and cranberry otc as well

## 2012-02-08 NOTE — Patient Instructions (Addendum)

## 2012-02-08 NOTE — Progress Notes (Signed)
Patient ID: [name], female   DOB: 08-04-1923, 78 y.o.   MRN: [id number] [name] [id number] 09-24-1923 02/08/2012      Progress Note-Follow Up  Subjective  Chief Complaint  Chief Complaint  Patient presents with  . Fatigue    and unsteady X 5 days, a little bit of a headache  . Injections    flu    HPI  Physical Exam  Constitutional: She is oriented to person, place, and time and well-developed, well-nourished, and in no distress. No distress.  HENT:  Head: Normocephalic and atraumatic.  Eyes: Conjunctivae normal are normal.  Neck: Neck supple. No thyromegaly present.  Cardiovascular: Normal rate, regular rhythm and normal heart sounds.   Pulmonary/Chest: Effort normal and breath sounds normal. She has no wheezes.  Abdominal: Soft. Bowel sounds are normal. She exhibits no distension and no mass. There is tenderness. There is no rebound and no guarding.       Mild tender with palp  Musculoskeletal: She exhibits no edema.  Lymphadenopathy:    She has no cervical adenopathy.  Neurological: She is alert and oriented to person, place, and time.  Skin: Skin is warm and dry. No rash noted. She is not diaphoretic.  Psychiatric: Memory, affect and judgment normal.    Past Medical History  Diagnosis Date  . CAD (coronary artery disease)   . PVD (peripheral vascular disease)   . Carotid artery stenosis   . HTN (hypertension)   . Hyperlipidemia   . Hypothyroidism   . Chronic renal failure   . Diabetes mellitus 07/16/2011  . Allergic state 07/16/2011  . Insomnia 07/16/2011  . Recurrent UTI 07/16/2011  . Asthma     childhood, recurrent at 6  . Myelodysplastic syndrome 04/02/2011    bladder cancer  . Bladder cancer 07/18/2011  . Sinusitis 07/18/2011  . Anemia 07/18/2011  . Arthritis 07/18/2011  . Cataract extraction status of right eye 08/18/2011  . Anxiety and depression 08/18/2011  . Bronchitis, acute 12/20/2011    Past Surgical History  Procedure Date  . Cholecystectomy     . Pcm - medtronic   . Abdominal hysterectomy   . Shoulder open rotator cuff repair   . Foot surgery     bunionectomy    Family History  Problem Relation Age of Onset  . Heart disease Mother     chf  . Arthritis Mother   . Arthritis Father   . Kidney disease Father   . Hypertension Father   . Cancer Brother     lung  . Arthritis Daughter     MVA with very serious injuries    History   Social History  . Marital Status: Widowed    Spouse Name: N/A    Number of Children: N/A  . Years of Education: N/A   Occupational History  . Not on file.   Social History Main Topics  . Smoking status: Never Smoker   . Smokeless tobacco: Never Used  . Alcohol Use: No  . Drug Use: No  . Sexually Active: No   Other Topics Concern  . Not on file   Social History Narrative  . No narrative on file    Current Outpatient Prescriptions on File Prior to Visit  Medication Sig Dispense Refill  . acetaminophen (TYLENOL) 500 MG tablet Take 500 mg by mouth every 6 (six) hours as needed.        Marland Kitchen amLODipine (NORVASC) 5 MG tablet Take 5 mg by mouth daily.       Marland Kitchen  aspirin 81 MG tablet Take 81 mg by mouth daily.        . fesoterodine (TOVIAZ) 4 MG TB24 Take 1 tablet (4 mg total) by mouth daily.  30 tablet  0  . levothyroxine (SYNTHROID, LEVOTHROID) 112 MCG tablet Take 112 mcg by mouth daily.       . metFORMIN (GLUMETZA) 1000 MG (MOD) 24 hr tablet Take 1 tablet (1,000 mg total) by mouth 2 (two) times daily with a meal.  60 tablet  4  . PARoxetine (PAXIL) 10 MG tablet Take 10 mg by mouth daily.        . rosuvastatin (CRESTOR) 20 MG tablet Take 20 mg by mouth daily.        . Mometasone Furo-Formoterol Fum (DULERA) 200-5 MCG/ACT AERO Inhale into the lungs. Use as needed         Allergies  Allergen Reactions  . Codeine   . Penicillins   . Sulfonamide Derivatives     Review of Systems  Review of Systems  Constitutional: Positive for chills and malaise/fatigue. Negative for fever.  HENT:  Negative for congestion.   Eyes: Negative for discharge.  Respiratory: Negative for shortness of breath.   Cardiovascular: Negative for chest pain, palpitations and leg swelling.  Gastrointestinal: Positive for abdominal pain. Negative for nausea, diarrhea and blood in stool.       Lower abd pain  Genitourinary: Positive for dysuria, urgency and frequency. Negative for hematuria.  Musculoskeletal: Positive for back pain. Negative for falls.       Low back pain  Skin: Negative for rash.  Neurological: Positive for weakness and headaches. Negative for loss of consciousness.  Endo/Heme/Allergies: Negative for polydipsia.  Psychiatric/Behavioral: Negative for depression and suicidal ideas. The patient is not nervous/anxious and does not have insomnia.     Objective  BP 163/95  Pulse 92  Temp 97.5 F (36.4 C) (Temporal)  Ht 5' (1.524 m)  Wt 109 lb 12.8 oz (49.805 kg)  BMI 21.44 kg/m2  SpO2 97%  Physical Exam  Patient is a 78 year old caucasian female in today for a roughly 5 day history of multiple symptoms. Initially she is complaining mostly of fatigue and malaise with more questioning she's also had some dysuria some intermittent low back and abdominal pain. She's had some fevers and chills increased fatigue and generalized headache. She technologist anorexia as well. She says she feels somewhat weak and woozy as well. Back in August she was started on Toviaz 4 mg daily for her overactive bladder and did actually initially think it was helping the last few days she's begun to question whether or not working as well. He denies chest pain, palpitations, shortness of breath, GI complaints. No diarrhea or constipation  Lab Results  Component Value Date   TSH 0.13* 08/17/2011   Lab Results  Component Value Date   WBC 7.4 01/25/2012   HGB 10.0* 01/25/2012   HCT 31.1* 01/25/2012   MCV 95.7 01/25/2012   PLT 126* 01/25/2012   Lab Results  Component Value Date   CREATININE 1.19* 11/02/2011    BUN 15 11/02/2011   NA 141 11/02/2011   K 4.2 11/02/2011   CL 103 11/02/2011   CO2 29 11/02/2011   Lab Results  Component Value Date   ALT 14 11/02/2011   AST 20 11/02/2011   ALKPHOS 42 11/02/2011   BILITOT 0.4 11/02/2011   Lab Results  Component Value Date   CHOL 123 08/17/2011   Lab Results  Component  Value Date   HDL 51.80 08/17/2011   Lab Results  Component Value Date   LDLCALC 41 08/17/2011   Lab Results  Component Value Date   TRIG 149.0 08/17/2011   Lab Results  Component Value Date   CHOLHDL 2 08/17/2011     Assessment & Plan  HYPERTENSION, UNSPECIFIED Improved with 150/88 on recheck likely elevated due to acute illness. Will recheck in 3 weeks when patient is feeling better before adjusting med futher.  Bladder cancer Does have some blood in her urine today but is very symptomatic suggesting UTI will reevaluate at next visit  Bronchitis, acute Respiratory symptoms improved  Diabetes mellitus Sugar up some with acute illness, 143 today, will continue to monitor and recheck at next visit, needs to minimize simple carbs, continue same meds  Recurrent UTI Appears to have recurred again. Started on Ciprofloxacin 250 mg po bid and urine sent for culture. Encouraged probiotics and cranberry otc as well

## 2012-02-08 NOTE — Assessment & Plan Note (Signed)
Does have some blood in her urine today but is very symptomatic suggesting UTI will reevaluate at next visit

## 2012-02-08 NOTE — Addendum Note (Signed)
Addended by: Baldemar Lenis R on: 02/08/2012 01:59 PM   Modules accepted: Orders

## 2012-02-08 NOTE — Assessment & Plan Note (Signed)
Respiratory symptoms improved

## 2012-02-10 LAB — URINE CULTURE: Colony Count: 9000

## 2012-02-10 NOTE — Progress Notes (Signed)
Quick Note:  Patient Informed and voiced understanding. Patient isn't feeling any better. Appt scheduled for ua for tomorrow ______

## 2012-02-11 ENCOUNTER — Other Ambulatory Visit (INDEPENDENT_AMBULATORY_CARE_PROVIDER_SITE_OTHER): Payer: Medicare Other

## 2012-02-11 DIAGNOSIS — R319 Hematuria, unspecified: Secondary | ICD-10-CM

## 2012-02-11 LAB — POCT URINALYSIS DIPSTICK
Nitrite, UA: NEGATIVE
Protein, UA: 100
Urobilinogen, UA: 0.2
pH, UA: 5.5

## 2012-02-13 LAB — URINE CULTURE: Colony Count: 25000

## 2012-02-14 NOTE — Progress Notes (Signed)
Quick Note:  Patient Informed and voiced understanding ______ 

## 2012-02-14 NOTE — Progress Notes (Signed)
Quick Note:  Patient Informed and voiced understanding.  Pt states she is feeling better, but she is going frequently ______

## 2012-02-15 ENCOUNTER — Other Ambulatory Visit (HOSPITAL_BASED_OUTPATIENT_CLINIC_OR_DEPARTMENT_OTHER): Payer: Medicare Other | Admitting: Lab

## 2012-02-15 ENCOUNTER — Ambulatory Visit: Payer: Medicare Other

## 2012-02-15 DIAGNOSIS — D469 Myelodysplastic syndrome, unspecified: Secondary | ICD-10-CM

## 2012-02-15 LAB — CBC WITH DIFFERENTIAL/PLATELET
Basophils Absolute: 0 10*3/uL (ref 0.0–0.1)
EOS%: 9 % — ABNORMAL HIGH (ref 0.0–7.0)
HCT: 35.4 % (ref 34.8–46.6)
HGB: 11 g/dL — ABNORMAL LOW (ref 11.6–15.9)
LYMPH%: 18.8 % (ref 14.0–49.7)
MCH: 30.4 pg (ref 25.1–34.0)
MCV: 97.8 fL (ref 79.5–101.0)
MONO%: 9.5 % (ref 0.0–14.0)
NEUT%: 62.2 % (ref 38.4–76.8)
Platelets: 137 10*3/uL — ABNORMAL LOW (ref 145–400)
lymph#: 1.5 10*3/uL (ref 0.9–3.3)

## 2012-02-15 MED ORDER — DARBEPOETIN ALFA-POLYSORBATE 300 MCG/0.6ML IJ SOLN: 300.0000 ug | Freq: Once | INTRAMUSCULAR | Status: DC

## 2012-02-29 ENCOUNTER — Encounter: Payer: Self-pay | Admitting: Family Medicine

## 2012-02-29 ENCOUNTER — Ambulatory Visit (INDEPENDENT_AMBULATORY_CARE_PROVIDER_SITE_OTHER): Payer: Medicare Other | Admitting: Family Medicine

## 2012-02-29 VITALS — BP 159/80 | HR 68 | Temp 97.1°F | Ht 60.0 in | Wt 108.0 lb

## 2012-02-29 DIAGNOSIS — N390 Urinary tract infection, site not specified: Secondary | ICD-10-CM

## 2012-02-29 DIAGNOSIS — R350 Frequency of micturition: Secondary | ICD-10-CM

## 2012-02-29 DIAGNOSIS — E119 Type 2 diabetes mellitus without complications: Secondary | ICD-10-CM

## 2012-02-29 DIAGNOSIS — D649 Anemia, unspecified: Secondary | ICD-10-CM

## 2012-02-29 DIAGNOSIS — I10 Essential (primary) hypertension: Secondary | ICD-10-CM

## 2012-02-29 DIAGNOSIS — F418 Other specified anxiety disorders: Secondary | ICD-10-CM

## 2012-02-29 DIAGNOSIS — R51 Headache: Secondary | ICD-10-CM

## 2012-02-29 DIAGNOSIS — R319 Hematuria, unspecified: Secondary | ICD-10-CM

## 2012-02-29 DIAGNOSIS — F341 Dysthymic disorder: Secondary | ICD-10-CM

## 2012-02-29 LAB — POCT URINALYSIS DIPSTICK
Ketones, UA: NEGATIVE
Protein, UA: 100
Spec Grav, UA: 1.025

## 2012-02-29 LAB — HEPATIC FUNCTION PANEL
AST: 21 U/L (ref 0–37)
Albumin: 3.8 g/dL (ref 3.5–5.2)
Alkaline Phosphatase: 32 U/L — ABNORMAL LOW (ref 39–117)
Total Bilirubin: 0.5 mg/dL (ref 0.3–1.2)

## 2012-02-29 LAB — TSH: TSH: 0.13 u[IU]/mL — ABNORMAL LOW (ref 0.35–5.50)

## 2012-02-29 LAB — RENAL FUNCTION PANEL
BUN: 21 mg/dL (ref 6–23)
CO2: 26 mEq/L (ref 19–32)
Calcium: 9.5 mg/dL (ref 8.4–10.5)
Chloride: 101 mEq/L (ref 96–112)
GFR: 43.6 mL/min — ABNORMAL LOW (ref >60.00)

## 2012-02-29 LAB — GLUCOSE, POCT (MANUAL RESULT ENTRY): POC Glucose: 179 mg/dl — AB (ref 70–99)

## 2012-02-29 LAB — CBC
HCT: 32.1 % — ABNORMAL LOW (ref 36.0–46.0)
MCHC: 31.9 g/dL (ref 30.0–36.0)
MCV: 98.2 fl (ref 78.0–100.0)
RDW: 17.8 % — ABNORMAL HIGH (ref 11.5–14.6)

## 2012-02-29 MED ORDER — PAROXETINE HCL 20 MG PO TABS: 20.0000 mg | ORAL_TABLET | ORAL | Status: DC

## 2012-02-29 MED ORDER — TRAMADOL HCL 50 MG PO TABS: 50.0000 mg | ORAL_TABLET | Freq: Three times a day (TID) | ORAL | Status: DC | PRN

## 2012-02-29 MED ORDER — LEVOFLOXACIN 250 MG PO TABS: 250.0000 mg | ORAL_TABLET | Freq: Every day | ORAL | Status: DC

## 2012-02-29 MED ORDER — PROBIOTIC PRODUCT PO CHEW: CHEWABLE_TABLET | ORAL | Status: DC

## 2012-02-29 NOTE — Patient Instructions (Addendum)
Urinary Tract Infection Urinary tract infections (UTIs) can develop anywhere along your urinary tract. Your urinary tract is your body's drainage system for removing wastes and extra water. Your urinary tract includes two kidneys, two ureters, a bladder, and a urethra. Your kidneys are a pair of bean-shaped organs. Each kidney is about the size of your fist. They are located below your ribs, one on each side of your spine. CAUSES Infections are caused by microbes, which are microscopic organisms, including fungi, viruses, and bacteria. These organisms are so small that they can only be seen through a microscope. Bacteria are the microbes that most commonly cause UTIs. SYMPTOMS  Symptoms of UTIs may vary by age and gender of the patient and by the location of the infection. Symptoms in young women typically include a frequent and intense urge to urinate and a painful, burning feeling in the bladder or urethra during urination. Older women and men are more likely to be tired, shaky, and weak and have muscle aches and abdominal pain. A fever may mean the infection is in your kidneys. Other symptoms of a kidney infection include pain in your back or sides below the ribs, nausea, and vomiting. DIAGNOSIS To diagnose a UTI, your caregiver will ask you about your symptoms. Your caregiver also will ask to provide a urine sample. The urine sample will be tested for bacteria and white blood cells. White blood cells are made by your body to help fight infection. TREATMENT  Typically, UTIs can be treated with medication. Because most UTIs are caused by a bacterial infection, they usually can be treated with the use of antibiotics. The choice of antibiotic and length of treatment depend on your symptoms and the type of bacteria causing your infection. HOME CARE INSTRUCTIONS  If you were prescribed antibiotics, take them exactly as your caregiver instructs you. Finish the medication even if you feel better after you  have only taken some of the medication.  Drink enough water and fluids to keep your urine clear or pale yellow.  Avoid caffeine, tea, and carbonated beverages. They tend to irritate your bladder.  Empty your bladder often. Avoid holding urine for long periods of time.  Empty your bladder before and after sexual intercourse.  After a bowel movement, women should cleanse from front to back. Use each tissue only once. SEEK MEDICAL CARE IF:   You have back pain.  You develop a fever.  Your symptoms do not begin to resolve within 3 days. SEEK IMMEDIATE MEDICAL CARE IF:   You have severe back pain or lower abdominal pain.  You develop chills.  You have nausea or vomiting.  You have continued burning or discomfort with urination. MAKE SURE YOU:   Understand these instructions.  Will watch your condition.  Will get help right away if you are not doing well or get worse. Document Released: 02/10/2005 Document Revised: 11/02/2011 Document Reviewed: 06/11/2011 Exit[employer] Patient Information 2013 [employer].  

## 2012-03-01 MED ORDER — LEVOTHYROXINE SODIUM 100 MCG PO TABS: 100.0000 ug | ORAL_TABLET | Freq: Every day | ORAL | Status: DC

## 2012-03-01 NOTE — Progress Notes (Signed)
Quick Note:  Patient Informed and voiced understanding. Pt stated she is feeling a little bit better, but not much. New RX sent to [facility] in [place] per pts request. ______

## 2012-03-02 ENCOUNTER — Encounter: Payer: Self-pay | Admitting: Family Medicine

## 2012-03-02 DIAGNOSIS — R51 Headache: Secondary | ICD-10-CM

## 2012-03-02 DIAGNOSIS — R519 Headache, unspecified: Secondary | ICD-10-CM | POA: Insufficient documentation

## 2012-03-02 HISTORY — DX: Headache: R51

## 2012-03-02 LAB — URINE CULTURE: Colony Count: 70000

## 2012-03-02 NOTE — Progress Notes (Signed)
Quick Note:  Patient Informed and voiced understanding. Patient states she is feeling much better, she is still having some frequent urination though. ______

## 2012-03-02 NOTE — Progress Notes (Signed)
Patient ID: [name], female   DOB: 1923-11-08, 78 y.o.   MRN: [id number] TALECIA GREUNKE [id number] 1924-04-14 03/02/2012      Progress Note-Follow Up  Subjective  Chief Complaint  Chief Complaint  Patient presents with  . Follow-up    3 week- burning and frequency while urinating, lower back pain, loss of appetite  . Fever    and legs hurt on Sunday and Monday- fever 102  . Headache    X 3 days    HPI  Patient is an 78 year old Caucasian female who is in today for fever. She's been struggling with urinary frequency and urgency as well as dysuria. Chest some mild increase in her low back pain. She's had a generalized headache for about 3 days. She notes some bilateral lower extremity aching and intermittent fever. Anorexia is noted but no nausea or vomiting. No diarrhea no chest pain, palpitations, shortness of breath. She has not been eating or drinking well.  Past Medical History  Diagnosis Date  . CAD (coronary artery disease)   . PVD (peripheral vascular disease)   . Carotid artery stenosis   . HTN (hypertension)   . Hyperlipidemia   . Hypothyroidism   . Chronic renal failure   . Diabetes mellitus 07/16/2011  . Allergic state 07/16/2011  . Insomnia 07/16/2011  . Recurrent UTI 07/16/2011  . Asthma     childhood, recurrent at 42  . Myelodysplastic syndrome 04/02/2011    bladder cancer  . Bladder cancer 07/18/2011  . Sinusitis 07/18/2011  . Anemia 07/18/2011  . Arthritis 07/18/2011  . Cataract extraction status of right eye 08/18/2011  . Anxiety and depression 08/18/2011  . Bronchitis, acute 12/20/2011  . Headache 03/02/2012    Past Surgical History  Procedure Date  . Cholecystectomy   . Pcm - medtronic   . Abdominal hysterectomy   . Shoulder open rotator cuff repair   . Foot surgery     bunionectomy    Family History  Problem Relation Age of Onset  . Heart disease Mother     chf  . Arthritis Mother   . Arthritis Father   . Kidney disease Father   . Hypertension  Father   . Cancer Brother     lung  . Arthritis Daughter     MVA with very serious injuries    History   Social History  . Marital Status: Widowed    Spouse Name: N/A    Number of Children: N/A  . Years of Education: N/A   Occupational History  . Not on file.   Social History Main Topics  . Smoking status: Never Smoker   . Smokeless tobacco: Never Used  . Alcohol Use: No  . Drug Use: No  . Sexually Active: No   Other Topics Concern  . Not on file   Social History Narrative  . No narrative on file    Current Outpatient Prescriptions on File Prior to Visit  Medication Sig Dispense Refill  . acetaminophen (TYLENOL) 500 MG tablet Take 500 mg by mouth every 6 (six) hours as needed.        Marland Kitchen aspirin 81 MG tablet Take 81 mg by mouth daily.        . metFORMIN (GLUMETZA) 1000 MG (MOD) 24 hr tablet Take 1 tablet (1,000 mg total) by mouth 2 (two) times daily with a meal.  60 tablet  4  . rosuvastatin (CRESTOR) 20 MG tablet Take 20 mg by mouth daily.        Marland Kitchen  fesoterodine (TOVIAZ) 4 MG TB24 Take 1 tablet (4 mg total) by mouth daily.  30 tablet  0  . Mometasone Furo-Formoterol Fum (DULERA) 200-5 MCG/ACT AERO Inhale into the lungs. Use as needed         Allergies  Allergen Reactions  . Codeine   . Penicillins   . Sulfonamide Derivatives     Review of Systems  Review of Systems  Constitutional: Positive for fever and malaise/fatigue.  HENT: Negative for congestion and sore throat.   Eyes: Negative for discharge.  Respiratory: Negative for shortness of breath.   Cardiovascular: Negative for chest pain, palpitations and leg swelling.  Gastrointestinal: Negative for nausea, abdominal pain and diarrhea.  Genitourinary: Positive for dysuria, urgency and frequency. Negative for hematuria and flank pain.  Musculoskeletal: Positive for myalgias. Negative for falls.  Skin: Negative for rash.  Neurological: Positive for dizziness and headaches. Negative for loss of consciousness.    Endo/Heme/Allergies: Negative for polydipsia.  Psychiatric/Behavioral: Negative for depression and suicidal ideas. The patient is not nervous/anxious and does not have insomnia.     Objective  BP 159/80  Pulse 68  Temp 97.1 F (36.2 C) (Temporal)  Ht 5' (1.524 m)  Wt 108 lb (48.988 kg)  BMI 21.09 kg/m2  SpO2 98%  Physical Exam  Physical Exam  Constitutional: She is oriented to person, place, and time and well-developed, well-nourished, and in no distress. No distress.       pale  HENT:  Head: Normocephalic and atraumatic.  Eyes: Conjunctivae normal are normal.  Neck: Neck supple. No thyromegaly present.  Cardiovascular: Normal rate, regular rhythm and normal heart sounds.   Pulmonary/Chest: Effort normal and breath sounds normal. She has no wheezes.  Abdominal: She exhibits no distension and no mass.  Musculoskeletal: She exhibits no edema.  Lymphadenopathy:    She has no cervical adenopathy.  Neurological: She is alert and oriented to person, place, and time.  Skin: Skin is warm and dry. No rash noted. She is not diaphoretic.  Psychiatric: Memory and judgment normal.       Flat affect    Lab Results  Component Value Date   TSH 0.13* 02/29/2012   Lab Results  Component Value Date   WBC 8.2 02/29/2012   HGB 10.2* 02/29/2012   HCT 32.1* 02/29/2012   MCV 98.2 02/29/2012   PLT 120.0* 02/29/2012   Lab Results  Component Value Date   CREATININE 1.2 02/29/2012   BUN 21 02/29/2012   NA 137 02/29/2012   K 4.5 02/29/2012   CL 101 02/29/2012   CO2 26 02/29/2012   Lab Results  Component Value Date   ALT 17 02/29/2012   AST 21 02/29/2012   ALKPHOS 32* 02/29/2012   BILITOT 0.5 02/29/2012   Lab Results  Component Value Date   CHOL 123 08/17/2011   Lab Results  Component Value Date   HDL 51.80 08/17/2011   Lab Results  Component Value Date   LDLCALC 41 08/17/2011   Lab Results  Component Value Date   TRIG 149.0 08/17/2011   Lab Results  Component Value Date    CHOLHDL 2 08/17/2011     Assessment & Plan  Recurrent UTI Culture grew out 70,000 units but of mixed flora. Have initiated antibiotics and patient is to call if she is not improved. Her son has come to pick her up today, we will see her in 2 weeks or sooner as needed.  HYPERTENSION, UNSPECIFIED Mildly elevated today with acute illness  Anemia Persistent but stable.  Headache Likely related to dehydration and acute illness, encouraged increased rest and hydration.

## 2012-03-02 NOTE — Assessment & Plan Note (Signed)
Persistent but stable

## 2012-03-02 NOTE — Assessment & Plan Note (Signed)
Likely related to dehydration and acute illness, encouraged increased rest and hydration.

## 2012-03-02 NOTE — Assessment & Plan Note (Signed)
Culture grew out 70,000 units but of mixed flora. Have initiated antibiotics and patient is to call if she is not improved. Her son has come to pick her up today, we will see her in 2 weeks or sooner as needed.

## 2012-03-02 NOTE — Assessment & Plan Note (Signed)
Mildly elevated today with acute illness

## 2012-03-07 ENCOUNTER — Ambulatory Visit (HOSPITAL_BASED_OUTPATIENT_CLINIC_OR_DEPARTMENT_OTHER): Payer: Medicare Other

## 2012-03-07 ENCOUNTER — Other Ambulatory Visit (HOSPITAL_BASED_OUTPATIENT_CLINIC_OR_DEPARTMENT_OTHER): Payer: Medicare Other | Admitting: Lab

## 2012-03-07 VITALS — BP 132/75 | HR 88 | Temp 97.0°F

## 2012-03-07 DIAGNOSIS — D469 Myelodysplastic syndrome, unspecified: Secondary | ICD-10-CM

## 2012-03-07 LAB — CBC WITH DIFFERENTIAL/PLATELET
Basophils Absolute: 0 10*3/uL (ref 0.0–0.1)
EOS%: 6.2 % (ref 0.0–7.0)
HCT: 32.6 % — ABNORMAL LOW (ref 34.8–46.6)
HGB: 10.3 g/dL — ABNORMAL LOW (ref 11.6–15.9)
MCH: 30.9 pg (ref 25.1–34.0)
NEUT%: 69.9 % (ref 38.4–76.8)
Platelets: 102 10*3/uL — ABNORMAL LOW (ref 145–400)
lymph#: 1.4 10*3/uL (ref 0.9–3.3)

## 2012-03-07 MED ORDER — DARBEPOETIN ALFA-POLYSORBATE 300 MCG/0.6ML IJ SOLN
300.0000 ug | Freq: Once | INTRAMUSCULAR | Status: AC
  Administered 2012-03-07: 300 ug via SUBCUTANEOUS
  Filled 2012-03-07: qty 0.6

## 2012-03-14 ENCOUNTER — Other Ambulatory Visit: Payer: Self-pay

## 2012-03-14 NOTE — Telephone Encounter (Signed)
Opened in error

## 2012-03-27 ENCOUNTER — Observation Stay (HOSPITAL_BASED_OUTPATIENT_CLINIC_OR_DEPARTMENT_OTHER)
Admission: EM | Admit: 2012-03-27 | Discharge: 2012-03-29 | Disposition: A | Discharge status: Home / Self Care | Payer: Medicare Other | Attending: Cardiovascular Disease | Admitting: Cardiovascular Disease

## 2012-03-27 ENCOUNTER — Emergency Department (HOSPITAL_BASED_OUTPATIENT_CLINIC_OR_DEPARTMENT_OTHER): Payer: Medicare Other

## 2012-03-27 ENCOUNTER — Encounter (HOSPITAL_BASED_OUTPATIENT_CLINIC_OR_DEPARTMENT_OTHER): Payer: Self-pay | Admitting: Family Medicine

## 2012-03-27 ENCOUNTER — Telehealth: Payer: Self-pay | Admitting: Internal Medicine

## 2012-03-27 DIAGNOSIS — E039 Hypothyroidism, unspecified: Secondary | ICD-10-CM | POA: Diagnosis present

## 2012-03-27 DIAGNOSIS — I2510 Atherosclerotic heart disease of native coronary artery without angina pectoris: Secondary | ICD-10-CM | POA: Diagnosis present

## 2012-03-27 DIAGNOSIS — I441 Atrioventricular block, second degree: Secondary | ICD-10-CM | POA: Insufficient documentation

## 2012-03-27 DIAGNOSIS — E119 Type 2 diabetes mellitus without complications: Secondary | ICD-10-CM | POA: Diagnosis present

## 2012-03-27 DIAGNOSIS — M25519 Pain in unspecified shoulder: Secondary | ICD-10-CM | POA: Insufficient documentation

## 2012-03-27 DIAGNOSIS — N3281 Overactive bladder: Secondary | ICD-10-CM

## 2012-03-27 DIAGNOSIS — I442 Atrioventricular block, complete: Secondary | ICD-10-CM | POA: Diagnosis present

## 2012-03-27 DIAGNOSIS — D649 Anemia, unspecified: Secondary | ICD-10-CM

## 2012-03-27 DIAGNOSIS — R0602 Shortness of breath: Secondary | ICD-10-CM | POA: Insufficient documentation

## 2012-03-27 DIAGNOSIS — R110 Nausea: Secondary | ICD-10-CM | POA: Insufficient documentation

## 2012-03-27 DIAGNOSIS — R079 Chest pain, unspecified: Secondary | ICD-10-CM

## 2012-03-27 DIAGNOSIS — R61 Generalized hyperhidrosis: Secondary | ICD-10-CM | POA: Insufficient documentation

## 2012-03-27 DIAGNOSIS — R0789 Other chest pain: Principal | ICD-10-CM

## 2012-03-27 DIAGNOSIS — I129 Hypertensive chronic kidney disease with stage 1 through stage 4 chronic kidney disease, or unspecified chronic kidney disease: Secondary | ICD-10-CM | POA: Insufficient documentation

## 2012-03-27 DIAGNOSIS — F418 Other specified anxiety disorders: Secondary | ICD-10-CM

## 2012-03-27 DIAGNOSIS — N189 Chronic kidney disease, unspecified: Secondary | ICD-10-CM | POA: Diagnosis present

## 2012-03-27 DIAGNOSIS — Z950 Presence of cardiac pacemaker: Secondary | ICD-10-CM | POA: Diagnosis present

## 2012-03-27 DIAGNOSIS — D539 Nutritional anemia, unspecified: Secondary | ICD-10-CM | POA: Diagnosis present

## 2012-03-27 DIAGNOSIS — E785 Hyperlipidemia, unspecified: Secondary | ICD-10-CM | POA: Diagnosis present

## 2012-03-27 DIAGNOSIS — I10 Essential (primary) hypertension: Secondary | ICD-10-CM | POA: Diagnosis present

## 2012-03-27 HISTORY — DX: Unspecified atrioventricular block: I44.30

## 2012-03-27 HISTORY — DX: Chronic kidney disease, stage 3 (moderate): N18.3

## 2012-03-27 HISTORY — DX: Chronic kidney disease, stage 3 unspecified: N18.30

## 2012-03-27 LAB — CBC WITH DIFFERENTIAL/PLATELET
Basophils Absolute: 0 10*3/uL (ref 0.0–0.1)
HCT: 33.5 % — ABNORMAL LOW (ref 36.0–46.0)
Hemoglobin: 10.5 g/dL — ABNORMAL LOW (ref 12.0–15.0)
Lymphocytes Relative: 18 % (ref 12–46)
Lymphs Abs: 1.3 10*3/uL (ref 0.7–4.0)
MCV: 99.4 fL (ref 78.0–100.0)
Monocytes Absolute: 0.9 10*3/uL (ref 0.1–1.0)
Neutro Abs: 4.6 10*3/uL (ref 1.7–7.7)
RBC: 3.37 MIL/uL — ABNORMAL LOW (ref 3.87–5.11)
RDW: 16.4 % — ABNORMAL HIGH (ref 11.5–15.5)
WBC: 7.3 10*3/uL (ref 4.0–10.5)

## 2012-03-27 LAB — BASIC METABOLIC PANEL
CO2: 28 mEq/L (ref 19–32)
Chloride: 101 mEq/L (ref 96–112)
Creatinine, Ser: 1.1 mg/dL (ref 0.50–1.10)
Glucose, Bld: 151 mg/dL — ABNORMAL HIGH (ref 70–99)

## 2012-03-27 LAB — TROPONIN I
Troponin I: <0.3 ng/mL (ref <0.30)
Troponin I: <0.3 ng/mL (ref <0.30)

## 2012-03-27 MED ORDER — ASPIRIN 81 MG PO CHEW
324.0000 mg | CHEWABLE_TABLET | Freq: Once | ORAL | Status: AC
  Administered 2012-03-27: 324 mg via ORAL
  Filled 2012-03-27: qty 4

## 2012-03-27 MED ORDER — POTASSIUM CHLORIDE IN NACL 20-0.45 MEQ/L-% IV SOLN
INTRAVENOUS | Status: DC
  Administered 2012-03-28: 04:00:00 via INTRAVENOUS
  Filled 2012-03-27: qty 1000

## 2012-03-27 MED ORDER — ONDANSETRON HCL 4 MG/2ML IJ SOLN: 4.0000 mg | Freq: Four times a day (QID) | INTRAMUSCULAR | Status: DC | PRN

## 2012-03-27 MED ORDER — ASPIRIN EC 81 MG PO TBEC
81.0000 mg | DELAYED_RELEASE_TABLET | Freq: Every day | ORAL | Status: DC
  Administered 2012-03-28 – 2012-03-29 (×2): 81 mg via ORAL
  Filled 2012-03-27 (×2): qty 1

## 2012-03-27 MED ORDER — MOMETASONE FURO-FORMOTEROL FUM 200-5 MCG/ACT IN AERO: 2.0000 | INHALATION_SPRAY | Freq: Three times a day (TID) | RESPIRATORY_TRACT | Status: DC | PRN

## 2012-03-27 MED ORDER — PAROXETINE HCL 20 MG PO TABS
20.0000 mg | ORAL_TABLET | Freq: Every day | ORAL | Status: DC
  Administered 2012-03-28 – 2012-03-29 (×2): 20 mg via ORAL
  Filled 2012-03-27 (×2): qty 1

## 2012-03-27 MED ORDER — MONTELUKAST SODIUM 10 MG PO TABS
10.0000 mg | ORAL_TABLET | Freq: Every day | ORAL | Status: DC
  Administered 2012-03-27 – 2012-03-28 (×2): 10 mg via ORAL
  Filled 2012-03-27 (×3): qty 1

## 2012-03-27 MED ORDER — DARIFENACIN HYDROBROMIDE ER 15 MG PO TB24
15.0000 mg | ORAL_TABLET | Freq: Every day | ORAL | Status: DC
  Administered 2012-03-28 – 2012-03-29 (×2): 15 mg via ORAL
  Filled 2012-03-27 (×2): qty 1

## 2012-03-27 MED ORDER — LEVOTHYROXINE SODIUM 100 MCG PO TABS
100.0000 ug | ORAL_TABLET | Freq: Every day | ORAL | Status: DC
  Administered 2012-03-28 – 2012-03-29 (×2): 100 ug via ORAL
  Filled 2012-03-27 (×3): qty 1

## 2012-03-27 MED ORDER — FESOTERODINE FUMARATE ER 4 MG PO TB24
4.0000 mg | ORAL_TABLET | Freq: Every day | ORAL | Status: DC
  Administered 2012-03-28 – 2012-03-29 (×2): 4 mg via ORAL
  Filled 2012-03-27 (×2): qty 1

## 2012-03-27 MED ORDER — INSULIN ASPART 100 UNIT/ML ~~LOC~~ SOLN: 0.0000 [IU] | Freq: Three times a day (TID) | SUBCUTANEOUS | Status: DC

## 2012-03-27 MED ORDER — NITROGLYCERIN 0.4 MG SL SUBL
0.4000 mg | SUBLINGUAL_TABLET | SUBLINGUAL | Status: DC | PRN
  Filled 2012-03-27: qty 25

## 2012-03-27 MED ORDER — ACETAMINOPHEN 325 MG PO TABS
650.0000 mg | ORAL_TABLET | ORAL | Status: DC | PRN
  Administered 2012-03-28 – 2012-03-29 (×3): 650 mg via ORAL
  Filled 2012-03-27 (×3): qty 2

## 2012-03-27 MED ORDER — HEPARIN SODIUM (PORCINE) 5000 UNIT/ML IJ SOLN
5000.0000 [IU] | Freq: Two times a day (BID) | INTRAMUSCULAR | Status: DC
  Administered 2012-03-27 – 2012-03-29 (×4): 5000 [IU] via SUBCUTANEOUS
  Filled 2012-03-27 (×5): qty 1

## 2012-03-27 MED ORDER — ACETAMINOPHEN 325 MG PO TABS
650.0000 mg | ORAL_TABLET | Freq: Once | ORAL | Status: AC
  Administered 2012-03-27: 650 mg via ORAL
  Filled 2012-03-27: qty 1

## 2012-03-27 MED ORDER — NITROGLYCERIN 2 % TD OINT
1.0000 [in_us] | TOPICAL_OINTMENT | Freq: Four times a day (QID) | TRANSDERMAL | Status: DC
  Administered 2012-03-28 (×5): 1 [in_us] via TOPICAL
  Filled 2012-03-27: qty 1
  Filled 2012-03-27: qty 30

## 2012-03-27 MED ORDER — ATORVASTATIN CALCIUM 40 MG PO TABS
40.0000 mg | ORAL_TABLET | Freq: Every day | ORAL | Status: DC
  Administered 2012-03-28: 40 mg via ORAL
  Filled 2012-03-27 (×2): qty 1

## 2012-03-27 MED ORDER — NITROGLYCERIN 0.4 MG SL SUBL: 0.4000 mg | SUBLINGUAL_TABLET | SUBLINGUAL | Status: DC | PRN

## 2012-03-27 NOTE — ED Notes (Signed)
MD at bedside. 

## 2012-03-27 NOTE — H&P (Signed)
History and Physical  Patient ID: [name] MRN: [id number], SOB: [birth year] 78 y.o. Date of Encounter: 03/27/2012, 10:14 PM  Primary Physician: Danise Edge, MD Primary Cardiologist: Dr. Graciela Husbands  Chief Complaint: chest pain  HPI: 78 y.o. female w/ PMHx significant for CAD s/p stent 2009, bradycardia s/p pacer, DM2, chronic lung disease who presented to [facility] on 03/27/2012 with complaints of chest discomfort. The symptoms started on Saturday and she describes them as both a pressure ("standing on my chest") as well as needle-like pain across the chest. Last for 1-2 minutes and she had them every couple of hours overnight interfering with her sleep. Pain also was in her left arm and she can provoke this pain with certain positions of her arm. She also notes development of a cough at the same time. Dry. No sick contacts. No fevers, possible subjective chills. Deep breath provokes the pain. Some nausea Saturday. Nonbloody emesis on Sunday. This AM, called provider and was referred to the ER.  Does not have nitro. Has been taking tylenol for her left arm pain and believes she is getting some relief.  Last chest pain episode in the ER at [place], resolved without intervention.  Of note, she recalls her presentation in 2009 prior to the stenting and PPM placement as dizziness. No such symptoms currently. Lives alone and performs all her ADLs without chest discomfort or abnormal SOB. In April of this year, had stress MPI for symptoms of fatigue (which is unchanged since then).  EKG revealed a sense, v paced. CXR was without acute cardiopulmonary abnormalities but does demonstrated chronic interstitial changes. Labs are significant for mild anemia.   Past Medical History  Diagnosis Date  . CAD (coronary artery disease)     a. 10/2007 PCI Diag w/ 2.25 x 8mm Taxus Atom DES.;  b. 09/2011 Lexi MV: EF 69%, no ischemia/infarct.  Marland Kitchen PVD (peripheral vascular disease)   . Carotid artery  stenosis   . HTN (hypertension)   . Hyperlipidemia   . Hypothyroidism   . CKD (chronic kidney disease), stage III   . Diabetes mellitus 07/16/2011  . Allergic state 07/16/2011  . Insomnia 07/16/2011  . Recurrent UTI 07/16/2011  . Asthma     childhood, recurrent at 36  . Myelodysplastic syndrome 04/02/2011  . Sinusitis 07/18/2011  . Anemia 07/18/2011  . Arthritis 07/18/2011  . Cataract extraction status of right eye 08/18/2011  . Anxiety and depression 08/18/2011  . Bronchitis, acute 12/20/2011  . Headache 03/02/2012  . AV heart block     a. 10/2007 MDT Versa dual chamber PPM.  . Bladder cancer 12/2002     Surgical History:  Past Surgical History  Procedure Date  . Cholecystectomy   . Pcm - medtronic   . Abdominal hysterectomy   . Shoulder open rotator cuff repair   . Foot surgery     bunionectomy     Home Meds: Prior to Admission medications   Medication Sig Start Date End Date Taking? Authorizing Provider  acetaminophen (TYLENOL) 500 MG tablet Take 500 mg by mouth every 6 (six) hours as needed. For pain   Yes Historical Provider, MD  aspirin 81 MG tablet Take 81 mg by mouth daily.     Yes Historical Provider, MD  Darbepoetin Alfa-Albumin (ARANESP IJ) Inject as directed.   Yes Historical Provider, MD  fesoterodine (TOVIAZ) 4 MG TB24 Take 1 tablet (4 mg total) by mouth daily. 12/17/11  Yes Bradd Canary, MD  levothyroxine (SYNTHROID, LEVOTHROID)  100 MCG tablet Take 1 tablet (100 mcg total) by mouth daily. 03/01/12  Yes Bradd Canary, MD  metFORMIN (GLUMETZA) 1000 MG (MOD) 24 hr tablet Take 1 tablet (1,000 mg total) by mouth 2 (two) times daily with a meal. 10/07/11  Yes Bradd Canary, MD  Mometasone Furo-Formoterol Fum (DULERA) 200-5 MCG/ACT AERO Inhale 2 puffs into the lungs 3 (three) times daily as needed. For shortness of breath   Yes Historical Provider, MD  montelukast (SINGULAIR) 10 MG tablet Take 10 mg by mouth at bedtime.   Yes Historical Provider, MD  PARoxetine (PAXIL) 20 MG tablet  Take 1 tablet (20 mg total) by mouth every morning. 02/29/12  Yes Bradd Canary, MD  rosuvastatin (CRESTOR) 20 MG tablet Take 20 mg by mouth daily.     Yes Historical Provider, MD  solifenacin (VESICARE) 5 MG tablet Take 10 mg by mouth daily.   Yes Historical Provider, MD    Allergies:  Allergies  Allergen Reactions  . Codeine     Unknown  . Penicillins     Unknown  . Sulfonamide Derivatives     Unknown    History   Social History  . Marital Status: Widowed    Spouse Name: N/A    Number of Children: N/A  . Years of Education: N/A   Occupational History  . Not on file.   Social History Main Topics  . Smoking status: Never Smoker   . Smokeless tobacco: Never Used  . Alcohol Use: No  . Drug Use: No  . Sexually Active: No   Other Topics Concern  . Not on file   Social History Narrative  . No narrative on file     Family History  Problem Relation Age of Onset  . Heart disease Mother     chf  . Arthritis Mother   . Arthritis Father   . Kidney disease Father   . Hypertension Father   . Cancer Brother     lung  . Arthritis Daughter     MVA with very serious injuries    Review of Systems: General: +chills,,, night sweats or weight changes.  Cardiovascular: see HPI  Dermatological: negative for rash Respiratory: see HPI Urologic: negative for hematuria Abdominal: n, vomiting, diarrhea, bright red blood per rectum, melena, or hematemesis Neurologic: negative for visual changes, syncope, or dizziness All other systems reviewed and are otherwise negative except as noted above.  Labs:   Lab Results  Component Value Date   WBC 7.3 03/27/2012   HGB 10.5* 03/27/2012   HCT 33.5* 03/27/2012   MCV 99.4 03/27/2012   PLT 125* 03/27/2012    Lab 03/27/12 1558  NA 140  K 3.6  CL 101  CO2 28  BUN 15  CREATININE 1.10  CALCIUM 9.6  PROT --  BILITOT --  ALKPHOS --  ALT --  AST --  GLUCOSE 151*    Basename 03/27/12 1558  CKTOTAL --  CKMB --  TROPONINI  <0.30   Lab Results  Component Value Date   CHOL 123 08/17/2011   HDL 51.80 08/17/2011   LDLCALC 41 08/17/2011   TRIG 149.0 08/17/2011   No results found for this basename: DDIMER    Radiology/Studies:  Dg Chest 2 View  03/27/2012  *RADIOLOGY REPORT*  Clinical Data: Chest pain  CHEST - 2 VIEW  Comparison: 12/17/2011  Findings: Borderline cardiomegaly.  Low lung volumes.  Diffuse irregular interstitial lung disease with a peripheral distribution. No new mass or consolidation.  No pleural effusion.  Stable thoracic spine. Dual lead left subclavian pacemaker device and leads are stable.  IMPRESSION: Stable chronic interstitial lung disease most consistent with IPF.   Original Report Authenticated By: Jolaine Click, M.D.      EKG: Sheilah Mins, v-paced  Physical Exam: Blood pressure 141/65, pulse 65, temperature 98.4 F (36.9 C), temperature source Oral, resp. rate 16, height 5' (1.524 m), weight 48.988 kg (108 lb), SpO2 98.00%. General: Well developed, well nourished, in no acute distress. Head: Normocephalic, atraumatic, sclera non-icteric, nares are without discharge Neck: Supple. Negative for carotid bruits. JVD not elevated. Lungs: unlabored breathing, dry end exp crackles diffusely Heart: RRR with S1 S2. No murmurs, rubs, or gallops appreciated. Able to provoke some chest discomfort with palpation Abdomen: Soft, non-tender, non-distended with normoactive bowel sounds. No rebound/guarding. No obvious abdominal masses. Msk:  Strength and tone appear normal for age. Extremities: No edema. No clubbing or cyanosis. Distal pedal pulses are 2+ and equal bilaterally. Neuro: Alert and oriented X 3. Moves all extremities spontaneously. Psych:  Responds to questions appropriately with a normal affect.   Problem List 1. Atypical chest pain 2. H/o CAD s/p stent 2009 3. S/p pacemaker 4. Chronic interstitial lung disease by cxray and exam 5. Mild anemia 6. CKD based upon GFR 7. HTN 8.  Hyperlipidemia 9. DM2  ASSESSMENT AND PLAN:  78 yo female with PMH significant for CAD s/p stent, HTN, DM2 presenting with 2 days of chest discomfort that has typical and atypical features.  Encouragingly, her biomarkers are negative and she is chest pain free. Recent stress testing in May 2013 was completely negative with EF > 55%. Her symptoms have some typical features (chest pressure, radiation, nausea) but also noncardiac features (brief, palpable, provoked with deep breath). Plan to continue telemetry, cycle troponins, and maintain outpat aspirin and statin.  Her symptoms have a pulmonary component to them as well (cough, subjective chills, pleuritic sxs). Though her xray is negative for acute infiltrate, she has a chronic appearing interstitial pattern and her exam is suggestive of this as well. Continue inhaler.  If biomarkers negative and symptoms improved in the AM, could consider observation approach to her symptoms (instead of repeat noninvasive risk stratification).  Continue home meds.  Code status: In the event of an emergency, the patient does not wish for heroic measures such as CPR and intubation.   Prophylaxis: subq heparin.  Signed, Adolm Joseph, Joyann Spidle C. MD 03/27/2012, 10:14 PM

## 2012-03-27 NOTE — ED Notes (Signed)
Pt. Report given to Reden RN at [facility]

## 2012-03-27 NOTE — ED Notes (Addendum)
OK with MD for pt to eat.  Chicken Meal, apple sauce and Water provided  per pt request.

## 2012-03-27 NOTE — ED Notes (Signed)
Pt c/o left sided chest pain radiating to left shoulder with shob. Pt sts she called her cardiologist at [facility] and they advised her to go to ED. Pt drove self here. Pt has pacemaker.

## 2012-03-27 NOTE — Telephone Encounter (Signed)
Per pt call - states she is having pain across her chest and into her left arm.  This has occurred twice this am.  Resolves somewhat with rest but not completely.  The discomfort is worse with movement of her arm and noted mostly around her pacer site.  She has never had discomfort like this before.  She is not aware of any injury.  She is having some SOB but no n/v.  She is not having the discomfort at the present time.  appt scheduled for her on Wednesday with Jacolyn Reedy PA.  She is instructed to call back if pain reoccurs or call 911 to go to the hospital for evaluation. She is in agreement.

## 2012-03-27 NOTE — ED Provider Notes (Signed)
History  This chart was scribed for [name] Bucco, MD by Ardeen Jourdain, ED Scribe. This patient was seen in room MH10/MH10 and the patient's care was started at 1507.  CSN: [id number]  Arrival date & time 03/27/12  1448   First MD Initiated Contact with Patient 03/27/12 1507      Chief Complaint  Patient presents with  . Chest Pain     The history is provided by the patient. No language interpreter was used.   [name] is a 78 y.o. female who presents to the Emergency Department complaining of chest pain located on her left side that radiates across her chest to her shoulder with associated SOB, nausea and sweats. She denies any emesis, abdominal pain, diarrhea and fever. She states that the intermittent pain started yesterday. States pain is at rest, non-pleuritic, non-exertional.  She also reports having cold like symptoms for the past few days. She describes the pain as a severe, stabbing and sharp pain. She denies a history of similar symptoms. She reports having a pacemaker. She denies taking any mediation for the pain or any recent injury. She has a h/o CAD, PVD, and HTN. She denies smoking and alcohol use.   PCP: Dr. Clide Cliff and Dr. Kevin Fenton   Past Medical History  Diagnosis Date  . CAD (coronary artery disease)     a. 10/2007 PCI Diag w/ 2.25 x 8mm Taxus Atom DES.;  b. 09/2011 Lexi MV: EF 69%, no ischemia/infarct.  Marland Kitchen PVD (peripheral vascular disease)   . Carotid artery stenosis   . HTN (hypertension)   . Hyperlipidemia   . Hypothyroidism   . CKD (chronic kidney disease), stage III   . Diabetes mellitus 07/16/2011  . Allergic state 07/16/2011  . Insomnia 07/16/2011  . Recurrent UTI 07/16/2011  . Asthma     childhood, recurrent at 45  . Myelodysplastic syndrome 04/02/2011  . Sinusitis 07/18/2011  . Anemia 07/18/2011  . Arthritis 07/18/2011  . Cataract extraction status of right eye 08/18/2011  . Anxiety and depression 08/18/2011  . Bronchitis, acute 12/20/2011  . Headache 03/02/2012    . AV heart block     a. 10/2007 MDT Versa dual chamber PPM.  . Bladder cancer 12/2002    Past Surgical History  Procedure Date  . Cholecystectomy   . Pcm - medtronic   . Abdominal hysterectomy   . Shoulder open rotator cuff repair   . Foot surgery     bunionectomy    Family History  Problem Relation Age of Onset  . Heart disease Mother     chf  . Arthritis Mother   . Arthritis Father   . Kidney disease Father   . Hypertension Father   . Cancer Brother     lung  . Arthritis Daughter     MVA with very serious injuries    History  Substance Use Topics  . Smoking status: Never Smoker   . Smokeless tobacco: Never Used  . Alcohol Use: No   No OB history available.    Review of Systems  Constitutional: Negative for fever, chills, diaphoresis and fatigue.  HENT: Negative for congestion, rhinorrhea and sneezing.   Eyes: Negative.   Respiratory: Positive for shortness of breath. Negative for cough and chest tightness.   Cardiovascular: Positive for chest pain. Negative for leg swelling.  Gastrointestinal: Positive for nausea. Negative for vomiting, abdominal pain, diarrhea and blood in stool.  Genitourinary: Negative for frequency, hematuria, flank pain and difficulty urinating.  Musculoskeletal: Negative  for back pain and arthralgias.  Skin: Negative for rash.  Neurological: Negative for dizziness, speech difficulty, weakness, numbness and headaches.  All other systems reviewed and are negative.    Allergies  Codeine; Penicillins; and Sulfonamide derivatives  Home Medications   Current Outpatient Rx  Name  Route  Sig  Dispense  Refill  . ACETAMINOPHEN 500 MG PO TABS   Oral   Take 500 mg by mouth every 6 (six) hours as needed.           . ASPIRIN 81 MG PO TABS   Oral   Take 81 mg by mouth daily.           . FESOTERODINE FUMARATE ER 4 MG PO TB24   Oral   Take 1 tablet (4 mg total) by mouth daily.   30 tablet   0   . LEVOFLOXACIN 250 MG PO TABS    Oral   Take 1 tablet (250 mg total) by mouth daily.   14 tablet   0   . LEVOTHYROXINE SODIUM 100 MCG PO TABS   Oral   Take 1 tablet (100 mcg total) by mouth daily.   30 tablet   2   . METFORMIN HCL ER (MOD) 1000 MG PO TB24   Oral   Take 1 tablet (1,000 mg total) by mouth 2 (two) times daily with a meal.   60 tablet   4   . MOMETASONE FURO-FORMOTEROL FUM 200-5 MCG/ACT IN AERO   Inhalation   Inhale into the lungs. Use as needed          . PAROXETINE HCL 20 MG PO TABS   Oral   Take 1 tablet (20 mg total) by mouth every morning.   30 tablet   2   . PROBIOTIC PRODUCT PO CHEW      Digestive Health probiotic gummies [employer]      0   . ROSUVASTATIN CALCIUM 20 MG PO TABS   Oral   Take 20 mg by mouth daily.           . TRAMADOL HCL 50 MG PO TABS   Oral   Take 1 tablet (50 mg total) by mouth every 8 (eight) hours as needed for pain.   30 tablet   0     Triage Vitals: BP 175/76  Pulse 65  Temp 98.5 F (36.9 C) (Oral)  Resp 16  Ht 5' (1.524 m)  Wt 108 lb (48.988 kg)  BMI 21.09 kg/m2  SpO2 100%  Physical Exam  Nursing note and vitals reviewed. Constitutional: She is oriented to person, place, and time. She appears well-developed and well-nourished.  HENT:  Head: Normocephalic and atraumatic.  Eyes: Pupils are equal, round, and reactive to light.  Neck: Normal range of motion. Neck supple.  Cardiovascular: Normal rate, regular rhythm and normal heart sounds.   Pulmonary/Chest: Effort normal and breath sounds normal. No respiratory distress. She has no wheezes. She has no rales. She exhibits tenderness.       Tenderness to left upper chest wall and left upper anterior shoulder   Abdominal: Soft. Bowel sounds are normal. There is no tenderness. There is no rebound and no guarding.  Musculoskeletal: Normal range of motion. She exhibits no edema.       No swelling of the extremities or calf tenderness  Lymphadenopathy:    She has no cervical adenopathy.    Neurological: She is alert and oriented to person, place, and time.  Skin: Skin is  warm and dry. No rash noted.  Psychiatric: She has a normal mood and affect.    ED Course  Procedures (including critical care time)  DIAGNOSTIC STUDIES:   COORDINATION OF CARE:  3:59 PM: Discussed treatment plan which includes blood work, a CXR and EKG with pt at bedside and pt agreed to plan.    Results for orders placed during the hospital encounter of 03/27/12  CBC WITH DIFFERENTIAL      Component Value Range   WBC 7.3  4.0 - 10.5 K/uL   RBC 3.37 (*) 3.87 - 5.11 MIL/uL   Hemoglobin 10.5 (*) 12.0 - 15.0 g/dL   HCT 14.7 (*) 82.9 - 56.2 %   MCV 99.4  78.0 - 100.0 fL   MCH 31.2  26.0 - 34.0 pg   MCHC 31.3  30.0 - 36.0 g/dL   RDW 13.0 (*) 86.5 - 78.4 %   Platelets 125 (*) 150 - 400 K/uL   Neutrophils Relative 63  43 - 77 %   Neutro Abs 4.6  1.7 - 7.7 K/uL   Lymphocytes Relative 18  12 - 46 %   Lymphs Abs 1.3  0.7 - 4.0 K/uL   Monocytes Relative 12  3 - 12 %   Monocytes Absolute 0.9  0.1 - 1.0 K/uL   Eosinophils Relative 7 (*) 0 - 5 %   Eosinophils Absolute 0.5  0.0 - 0.7 K/uL   Basophils Relative 0  0 - 1 %   Basophils Absolute 0.0  0.0 - 0.1 K/uL  BASIC METABOLIC PANEL      Component Value Range   Sodium 140  135 - 145 mEq/L   Potassium 3.6  3.5 - 5.1 mEq/L   Chloride 101  96 - 112 mEq/L   CO2 28  19 - 32 mEq/L   Glucose, Bld 151 (*) 70 - 99 mg/dL   BUN 15  6 - 23 mg/dL   Creatinine, Ser 6.96  0.50 - 1.10 mg/dL   Calcium 9.6  8.4 - 29.5 mg/dL   GFR calc non Af Amer 44 (*) >90 mL/min   GFR calc Af Amer 52 (*) >90 mL/min  TROPONIN I      Component Value Range   Troponin I <0.30  <0.30 ng/mL   Dg Chest 2 View  03/27/2012  *RADIOLOGY REPORT*  Clinical Data: Chest pain  CHEST - 2 VIEW  Comparison: 12/17/2011  Findings: Borderline cardiomegaly.  Low lung volumes.  Diffuse irregular interstitial lung disease with a peripheral distribution. No new mass or consolidation.  No pleural  effusion.  Stable thoracic spine. Dual lead left subclavian pacemaker device and leads are stable.  IMPRESSION: Stable chronic interstitial lung disease most consistent with IPF.   Original Report Authenticated By: Jolaine Click, M.D.       Date: 03/27/2012  Rate: 68  Rhythm: paced rhythm  QRS Axis: left  Intervals: normal  ST/T Wave abnormalities: nonspecific ST/T changes  Conduction Disutrbances:nonspecific intraventricular conduction delay, associated with paced rhythm  Narrative Interpretation:   Old EKG Reviewed: changes noted, leftward axis   1. Chest pain       MDM  Pt given asa, nitro.  Discussed with Dr Clifton James who will admit pt, transferred to [facility].      I personally performed the services described in this documentation, which was scribed in my presence.  The recorded information has been reviewed and considered.    [name] Bucco, MD 03/27/12 1850

## 2012-03-27 NOTE — Telephone Encounter (Signed)
New Problem:    Patient called because she has been having great chest pains (nothing severe just enough to irritate and aggravate her) that come and go and was hoping to be seen sooner.  Please call back.

## 2012-03-27 NOTE — ED Notes (Signed)
Family at bedside. 

## 2012-03-27 NOTE — ED Notes (Signed)
Pt. Wet her pants while trying to call for RN to go to rest room   Pt. Reported she just could not hold it and "it just ran out"

## 2012-03-28 ENCOUNTER — Ambulatory Visit: Payer: Medicare Other

## 2012-03-28 ENCOUNTER — Other Ambulatory Visit: Payer: Medicare Other | Admitting: Lab

## 2012-03-28 DIAGNOSIS — D649 Anemia, unspecified: Secondary | ICD-10-CM

## 2012-03-28 DIAGNOSIS — R079 Chest pain, unspecified: Secondary | ICD-10-CM

## 2012-03-28 DIAGNOSIS — R0789 Other chest pain: Secondary | ICD-10-CM

## 2012-03-28 LAB — BASIC METABOLIC PANEL
Calcium: 9.3 mg/dL (ref 8.4–10.5)
Creatinine, Ser: 1.25 mg/dL — ABNORMAL HIGH (ref 0.50–1.10)
GFR calc Af Amer: 44 mL/min — ABNORMAL LOW (ref >90)

## 2012-03-28 LAB — GLUCOSE, CAPILLARY: Glucose-Capillary: 108 mg/dL — ABNORMAL HIGH (ref 70–99)

## 2012-03-28 LAB — CBC
MCH: 29.9 pg (ref 26.0–34.0)
MCV: 96.9 fL (ref 78.0–100.0)
Platelets: 120 10*3/uL — ABNORMAL LOW (ref 150–400)
RDW: 16.5 % — ABNORMAL HIGH (ref 11.5–15.5)
WBC: 6.3 10*3/uL (ref 4.0–10.5)

## 2012-03-28 LAB — TROPONIN I: Troponin I: <0.3 ng/mL (ref <0.30)

## 2012-03-28 MED ORDER — PANTOPRAZOLE SODIUM 40 MG PO TBEC
40.0000 mg | DELAYED_RELEASE_TABLET | Freq: Every day | ORAL | Status: DC
  Administered 2012-03-28: 40 mg via ORAL
  Filled 2012-03-28: qty 1

## 2012-03-28 NOTE — Progress Notes (Signed)
UR Completed.  Vangie Bicker [phone] 03/28/2012

## 2012-03-28 NOTE — Progress Notes (Signed)
Patient Name: [name] Date of Encounter: 03/28/2012     Principal Problem:  *Atypical chest pain Active Problems:  HYPOTHYROIDISM  HYPERLIPIDEMIA-MIXED  HYPERTENSION, UNSPECIFIED  SECOND DEGREE AV BLOCK, MOBITZ II  RENAL FAILURE, CHRONIC  CAD  Diabetes mellitus  Anemia  Pacemaker    SUBJECTIVE: Continues to have intermittent left-upper chest pain radiating to left arm. No SOB, palpitations or lightheadedness.    OBJECTIVE  Filed Vitals:   03/27/12 2236 03/28/12 0441 03/28/12 0807 03/28/12 0815  BP: 129/72 121/75 140/68 134/62  Pulse: 69 62  60  Temp: 97.5 F (36.4 C) 98 F (36.7 C)  97.7 F (36.5 C)  TempSrc: Axillary Oral  Oral  Resp: 16 16    Height: 5' (1.524 m)     Weight: 51.1 kg (112 lb 10.5 oz)     SpO2: 98% 100%  100%   No intake or output data in the 24 hours ending 03/28/12 0842 Weight change:   PHYSICAL EXAM  General: Elderly, thin, well developed, in no acute distress. Head: Normocephalic, atraumatic, sclera non-icteric, no xanthomas, nares are without discharge. Neck: Supple without bruits or JVD. Lungs:  Resp regular and unlabored, peripheral dry rales, no wheezes or rhonchi Heart: RRR no s3, s4, or murmurs. Abdomen: Soft, non-tender, non-distended, BS + x 4.  Msk:  Strength and tone appears normal for age. Extremities: No clubbing, cyanosis or edema. DP/PT/Radials 2+ and equal bilaterally. Neuro:  Alert and oriented X 3. Moves all extremities spontaneously. Psych: Normal affect.  LABS:  Recent Labs  [facility] 03/28/12 0320 03/27/12 1558   WBC 6.3 7.3   HGB 8.6* 10.5*   HCT 27.9* 33.5*   MCV 96.9 99.4   PLT 120* 125*   Lab 03/28/12 0320 03/27/12 1558  NA 142 140  K 3.6 3.6  CL 106 101  CO2 29 28  BUN 16 15  CREATININE 1.25* 1.10  CALCIUM 9.3 9.6  PROT -- --  BILITOT -- --  ALKPHOS -- --  ALT -- --  AST -- --  AMYLASE -- --  LIPASE -- --  GLUCOSE 100* 151*   Recent Labs  Basename 03/28/12 0320 03/27/12 2217  03/27/12 1558   CKTOTAL -- -- --   CKMB -- -- --   CKMBINDEX -- -- --   TROPONINI <0.30 <0.30 <0.30   TELE: A-sensing, V-pacing; A-pacing intermittently, rate 60s  Radiology/Studies:  Dg Chest 2 View  03/27/2012  *RADIOLOGY REPORT*  Clinical Data: Chest pain  CHEST - 2 VIEW  Comparison: 12/17/2011  Findings: Borderline cardiomegaly.  Low lung volumes.  Diffuse irregular interstitial lung disease with a peripheral distribution. No new mass or consolidation.  No pleural effusion.  Stable thoracic spine. Dual lead left subclavian pacemaker device and leads are stable.  IMPRESSION: Stable chronic interstitial lung disease most consistent with IPF.   Original Report Authenticated By: Jolaine Click, M.D.     Current Medications:     . [COMPLETED] acetaminophen  650 mg Oral Once  . [COMPLETED] aspirin  324 mg Oral Once  . aspirin EC  81 mg Oral Daily  . atorvastatin  40 mg Oral q1800  . darifenacin  15 mg Oral Daily  . fesoterodine  4 mg Oral Daily  . heparin  5,000 Units Subcutaneous BID  . insulin aspart  0-9 Units Subcutaneous TID WC  . levothyroxine  100 mcg Oral Q0600  . montelukast  10 mg Oral QHS  . nitroGLYCERIN  1 inch Topical Q6H  . PARoxetine  20 mg Oral Daily    ASSESSMENT AND PLAN:  1. Atypical chest pain- patient reports intermittent cp lasting "a few minutes" described as "somone standing on my chest" w/ a "tingling feeling" radiating to her left arm. This is aggravated on exertion at times, and also at rest. Mostly atypical, but patient female, elderly and diabetic, and very well may present atypically. Has effectively ruled out- trop neg x 3. Myoview in 09/2011 revealed no ischemia, no WMAs, LVEF 69%. Aggravated somewhat on palpation. Does note a new nonproductive cough, no fevers, chills, sick contacts or tobacco abuse. In setting of normal Myoview, would favor further work-up by PCP for alternative etiologies (DDx includes pleuritis, costochondritis). Definitive diagnostic  cath given CAD would not be unreasonable. Will discuss with MD.   2. Normocytic anemia- H/H down from 10.5->8.6. Has underlying CKD likely accounting for baseline CKD. Acute H/H decrease may be related to hemodilution. Was sufficiently hydrated overnight given her weight.  Denies hematuria, melena, hematochezia, hemoptysis or hematemesis. Could check u/a and FOBT to be definitive.   3. CAD s/p PCI- normal stress test May 2013. Continue ASA, statin. Hold off on BB given underlying pulmonary dysfunction. No ACEi d/t underling CKD.   4. Mobitz, type II AV block - s/p PPM  5. Chronic interstitial lung dz- evidenced on chest CT 11/2007. Dry Velcro crackles on exam. Not hypoxic or dyspneic. Follow-up pulmonology for ILD etiologies +/- PFTs.   6. DM2- continue SSI. A1C 6.1 in 08/2011. Follow-up PCP.   7. HTN- fairly well-controlled. Consider adding Norvasc should further BP control be needed in the future.   8. Dyslipidemia- continue statin. LDL well-controlled on 08/2011 lipid panel. Continue metformin on discharge.   9. CKD, stage III-IV- follow-up with PCP. Likely attributed to underlying DM2 and progressive renal insufficiency with age.   10. Hypothyroidism- continue Synthroid.    Signed, R. Hurman Horn, PA-C 03/28/2012, 8:42 AM Patient seen and examined and history reviewed. Agree with above findings and plan.  Patient continues to have atypical chest pain. Describes as pins and needles across chest lasting seconds. Does have some pressure as well. She has ruled out for MI. Prior Myoview this year OK. I don't think cardiac cath is needed. Drop in Hgb overnight is worrisome. She does have a history of anemia and is followed at the cancer center. Will heme check stool. Start PPI. Will repeat CBC in am. If stable would anticipate DC tomorrow am with conservative medical therapy. Patient does have significant dry crackles on lung exam and CXR findings of interstitial lung disease. Will need  pulmonary evaluation as outpatient.  Theron Arista [facility] 03/28/2012 2:23 PM

## 2012-03-29 ENCOUNTER — Ambulatory Visit: Payer: Medicare Other | Admitting: Physician Assistant

## 2012-03-29 ENCOUNTER — Telehealth: Payer: Self-pay | Admitting: Oncology

## 2012-03-29 DIAGNOSIS — Z950 Presence of cardiac pacemaker: Secondary | ICD-10-CM

## 2012-03-29 DIAGNOSIS — I10 Essential (primary) hypertension: Secondary | ICD-10-CM

## 2012-03-29 DIAGNOSIS — E119 Type 2 diabetes mellitus without complications: Secondary | ICD-10-CM

## 2012-03-29 LAB — GLUCOSE, CAPILLARY: Glucose-Capillary: 93 mg/dL (ref 70–99)

## 2012-03-29 LAB — CBC
HCT: 30.2 % — ABNORMAL LOW (ref 36.0–46.0)
Hemoglobin: 9.5 g/dL — ABNORMAL LOW (ref 12.0–15.0)
MCH: 30.3 pg (ref 26.0–34.0)
MCV: 96.2 fL (ref 78.0–100.0)
RBC: 3.14 MIL/uL — ABNORMAL LOW (ref 3.87–5.11)

## 2012-03-29 MED ORDER — NITROGLYCERIN 0.4 MG SL SUBL: 0.4000 mg | SUBLINGUAL_TABLET | SUBLINGUAL | Status: DC | PRN

## 2012-03-29 NOTE — Discharge Summary (Signed)
Patient seen and examined and history reviewed. Agree with above findings and plan. See rounding note earlier today.  Theron Arista [facility] 03/29/2012 3:01 PM

## 2012-03-29 NOTE — Telephone Encounter (Signed)
Talked to patient, she is hospitalized, being dc today by [facility], emailed Dr. Reece Agar regarding appt for pt, waiting for answer

## 2012-03-29 NOTE — Discharge Summary (Signed)
Patient ID: [name],  MRN: [id number], DOB/AGE: 1923/09/09 78 y.o.  Admit date: 03/27/2012 Discharge date: 03/29/2012  Primary Care Provider: Danise Edge Primary Cardiologist: P. [place], MD  Discharge Diagnoses Principal Problem:  *Atypical chest pain Active Problems:  HYPOTHYROIDISM  HYPERLIPIDEMIA-MIXED  HYPERTENSION, UNSPECIFIED  SECOND DEGREE AV BLOCK, MOBITZ II  RENAL FAILURE, CHRONIC  CAD  Diabetes mellitus  Anemia  Pacemaker  Allergies Allergies  Allergen Reactions  . Codeine     Unknown  . Penicillins     Unknown  . Sulfonamide Derivatives     Unknown   Procedures  None  History of Present Illness  78 y/o female with prior h/o CAD s/p stenting in 2009, with a negative myoview in 09/2011, who was in her USOH until last weekend, when she began to experience intermittent sharp/needle-like chest pains along with chest pressure, typically lasting 1 or 2 minutes and resolving spontaneously.  She also noted similar discomfort in her left arm, which was somewhat positional.  She presented to the [facility] ED on 11/11, where ecg showed a V paced rhythm and initial troponins were normal.  She was transferred to [facility] for observation.  Hospital Course  Pt r/o for MI.  She continued to have intermittent sharp and shooting chest discomfort along with chest pressure.  Despite this, there was no objective evidence of ischemia.  In light this, along with a negative myoview earlier this year, decision was made to continue conservative management.  This AM, she reports that symptoms have improved and we plan to discharge her home today in good condition.  Of note, she has mild, persistent anemia.  Her stool is FOB negative and H/H has been stable.  Discharge Vitals Blood pressure 158/74, pulse 67, temperature 98.2 F (36.8 C), temperature source Oral, resp. rate 16, height 5' (1.524 m), weight 112 lb 10.5 oz (51.1 kg), SpO2 99.00%.  Filed Weights   03/27/12  1502 03/27/12 2236  Weight: 108 lb (48.988 kg) 112 lb 10.5 oz (51.1 kg)   Labs  CBC  Basename 03/29/12 0525 03/28/12 0320 03/27/12 1558  WBC 8.0 6.3 --  NEUTROABS -- -- 4.6  HGB 9.5* 8.6* --  HCT 30.2* 27.9* --  MCV 96.2 96.9 --  PLT 139* 120* --   Basic Metabolic Panel  Basename 03/28/12 0320 03/27/12 1558  NA 142 140  K 3.6 3.6  CL 106 101  CO2 29 28  GLUCOSE 100* 151*  BUN 16 15  CREATININE 1.25* 1.10  CALCIUM 9.3 9.6  MG -- --  PHOS -- --   Cardiac Enzymes  Basename 03/28/12 1012 03/28/12 0320 03/27/12 2217  CKTOTAL -- -- --  CKMB -- -- --  CKMBINDEX -- -- --  TROPONINI <0.30 <0.30 <0.30   Disposition  Pt is being discharged home today in good condition.  Follow-up Plans & Appointments  Follow-up Information    Follow up with [facility]. On 04/10/2012. (10:30 AM)    Contact information:   [phone]      Follow up with Jacolyn Reedy, PA. On 04/11/2012. (10 AM)    Contact information:   [address] [place], [place] [address] [place] [address] [phone]         Discharge Medications    Medication List     As of 03/29/2012 11:35 AM    TAKE these medications         acetaminophen 500 MG tablet   Commonly known as: TYLENOL  Take 500 mg by mouth every 6 (six) hours as needed. For pain      ARANESP IJ   Inject as directed.      aspirin 81 MG tablet   Take 81 mg by mouth daily.      DULERA 200-5 MCG/ACT Aero   Generic drug: mometasone-formoterol   Inhale 2 puffs into the lungs 3 (three) times daily as needed. For shortness of breath      fesoterodine 4 MG Tb24   Commonly known as: TOVIAZ   Take 1 tablet (4 mg total) by mouth daily.      levothyroxine 100 MCG tablet   Commonly known as: SYNTHROID, LEVOTHROID   Take 1 tablet (100 mcg total) by mouth daily.      metFORMIN 1000 MG (MOD) 24 hr tablet   Commonly known as: GLUMETZA   Take 1 tablet (1,000 mg total) by mouth 2 (two) times daily with a meal.       montelukast 10 MG tablet   Commonly known as: SINGULAIR   Take 10 mg by mouth at bedtime.      nitroGLYCERIN 0.4 MG SL tablet   Commonly known as: NITROSTAT   Place 1 tablet (0.4 mg total) under the tongue every 5 (five) minutes x 3 doses as needed for chest pain.      PARoxetine 20 MG tablet   Commonly known as: PAXIL   Take 1 tablet (20 mg total) by mouth every morning.      rosuvastatin 20 MG tablet   Commonly known as: CRESTOR   Take 20 mg by mouth daily.      solifenacin 5 MG tablet   Commonly known as: VESICARE   Take 10 mg by mouth daily.      Outstanding Labs/Studies  None  Duration of Discharge Encounter   Greater than 30 minutes including physician time.  Signed, Nicolasa Ducking NP 03/29/2012, 11:35 AM

## 2012-03-29 NOTE — Progress Notes (Signed)
Patient Name: [name] Date of Encounter: 03/29/2012   Principal Problem:  *Atypical chest pain Active Problems:  HYPOTHYROIDISM  HYPERLIPIDEMIA-MIXED  HYPERTENSION, UNSPECIFIED  SECOND DEGREE AV BLOCK, MOBITZ II  RENAL FAILURE, CHRONIC  CAD  Diabetes mellitus  Anemia  Pacemaker   SUBJECTIVE  No chest pain or sob overnight.  Has had a headache, which she believes is r/t NTP - she has taken off.  CURRENT MEDS    . aspirin EC  81 mg Oral Daily  . atorvastatin  40 mg Oral q1800  . darifenacin  15 mg Oral Daily  . fesoterodine  4 mg Oral Daily  . heparin  5,000 Units Subcutaneous BID  . insulin aspart  0-9 Units Subcutaneous TID WC  . levothyroxine  100 mcg Oral Q0600  . montelukast  10 mg Oral QHS  . nitroGLYCERIN  1 inch Topical Q6H  . pantoprazole  40 mg Oral Q1200  . PARoxetine  20 mg Oral Daily   OBJECTIVE  Filed Vitals:   03/28/12 1329 03/28/12 2013 03/29/12 0539 03/29/12 0554  BP: 118/59 149/63 168/68 158/74  Pulse: 72 67 67   Temp: 98.4 F (36.9 C) 98.6 F (37 C) 98.2 F (36.8 C)   TempSrc: Oral Oral Oral   Resp: 18 18 16    Height:      Weight:      SpO2: 100% 98% 99%     Intake/Output Summary (Last 24 hours) at 03/29/12 0659 Last data filed at 03/28/12 1630  Gross per 24 hour  Intake    720 ml  Output      0 ml  Net    720 ml   Filed Weights   03/27/12 1502 03/27/12 2236  Weight: 108 lb (48.988 kg) 112 lb 10.5 oz (51.1 kg)   PHYSICAL EXAM  General: Pleasant, NAD. Neuro: Alert and oriented X 3. Moves all extremities spontaneously. Psych: Normal affect. HEENT:  Normal  Neck: Supple without bruits or JVD. Lungs:  Resp regular and unlabored, bibasilar crackles. Heart: RRR no s3, s4, or murmurs. Abdomen: Soft, non-tender, non-distended, BS + x 4.  Extremities: No clubbing, cyanosis or edema. DP/PT/Radials 2+ and equal bilaterally.  Accessory Clinical Findings  CBC  Basename 03/29/12 0525 03/28/12 0320 03/27/12 1558  WBC 8.0 6.3  --  NEUTROABS -- -- 4.6  HGB 9.5* 8.6* --  HCT 30.2* 27.9* --  MCV 96.2 96.9 --  PLT 139* 120* --   Basic Metabolic Panel  Basename 03/28/12 0320 03/27/12 1558  NA 142 140  K 3.6 3.6  CL 106 101  CO2 29 28  GLUCOSE 100* 151*  BUN 16 15  CREATININE 1.25* 1.10  CALCIUM 9.3 9.6  MG -- --  PHOS -- --   Cardiac Enzymes  Basename 03/28/12 1012 03/28/12 0320 03/27/12 2217  CKTOTAL -- -- --  CKMB -- -- --  CKMBINDEX -- -- --  TROPONINI <0.30 <0.30 <0.30    TELE  A sense, V pace.  Radiology/Studies  Dg Chest 2 View  03/27/2012  *RADIOLOGY REPORT*  Clinical Data: Chest pain  CHEST - 2 VIEW  Comparison: 12/17/2011  Findings: Borderline cardiomegaly.  Low lung volumes.  Diffuse irregular interstitial lung disease with a peripheral distribution. No new mass or consolidation.  No pleural effusion.  Stable thoracic spine. Dual lead left subclavian pacemaker device and leads are stable.  IMPRESSION: Stable chronic interstitial lung disease most consistent with IPF.   Original Report Authenticated By: Jolaine Click, M.D.  ASSESSMENT AND PLAN  1.  Atypical Chest pain/CAD:  Resolved.  No further plans for ischemic eval given negative myoview earlier this year.  D/c ntp.  Cont asa, statin.  2. Normocytic anemia:  H/H improved from yesterday.  FOB negative.  Nl iron studies in 2011 - though values were on the low-end of nl.  3. ILD:  F/u pulm as outpt.  No c/o dyspnea.  4.  HTN:  Stable.  5.  HL:  Cont statin.  6.  CKD III-IV:  Has been stable.  7.  DM2: resume metformin on d/c.  Signed, Nicolasa Ducking NP Patient seen and examined and history reviewed. Agree with above findings and plan. Exam is unchanged. Bilateral dry pulmonary crackles. No gallop or murmur. No edema. Chest pain has improved. Stool heme negative. Hgb has come up. Patient will reschedule her "shot" at the cancer center. Stable for discharge today. Given negative myoview earlier this year, advanced age, and  atypical symptoms would not pursue further ischemic work up at this time.   Theron Arista [facility] 03/29/2012 9:53 AM

## 2012-04-03 ENCOUNTER — Telehealth: Payer: Self-pay

## 2012-04-03 NOTE — Telephone Encounter (Signed)
Patient left a message stating she had a medication question and to give her a call back.   I left a message for pt to return my call.

## 2012-04-05 ENCOUNTER — Encounter: Payer: Self-pay | Admitting: *Deleted

## 2012-04-05 NOTE — Telephone Encounter (Signed)
I called patient and she stated she wanted a refill on her Topamax but pt stated she saw another MD and they refilled it

## 2012-04-11 ENCOUNTER — Ambulatory Visit: Payer: Medicare Other | Admitting: Physician Assistant

## 2012-04-18 ENCOUNTER — Ambulatory Visit (HOSPITAL_BASED_OUTPATIENT_CLINIC_OR_DEPARTMENT_OTHER): Payer: Medicare Other

## 2012-04-18 ENCOUNTER — Other Ambulatory Visit (HOSPITAL_BASED_OUTPATIENT_CLINIC_OR_DEPARTMENT_OTHER): Payer: Medicare Other | Admitting: Lab

## 2012-04-18 VITALS — BP 142/80 | HR 88 | Temp 97.4°F

## 2012-04-18 DIAGNOSIS — D469 Myelodysplastic syndrome, unspecified: Secondary | ICD-10-CM

## 2012-04-18 DIAGNOSIS — D4620 Refractory anemia with excess of blasts, unspecified: Secondary | ICD-10-CM

## 2012-04-18 LAB — CBC WITH DIFFERENTIAL/PLATELET
Basophils Absolute: 0 10*3/uL (ref 0.0–0.1)
EOS%: 6.6 % (ref 0.0–7.0)
Eosinophils Absolute: 0.6 10*3/uL — ABNORMAL HIGH (ref 0.0–0.5)
HCT: 32.3 % — ABNORMAL LOW (ref 34.8–46.6)
HGB: 10.2 g/dL — ABNORMAL LOW (ref 11.6–15.9)
MCH: 31.2 pg (ref 25.1–34.0)
NEUT#: 6.2 10*3/uL (ref 1.5–6.5)
NEUT%: 65.5 % (ref 38.4–76.8)
lymph#: 1.6 10*3/uL (ref 0.9–3.3)

## 2012-04-18 MED ORDER — DARBEPOETIN ALFA-POLYSORBATE 300 MCG/0.6ML IJ SOLN
300.0000 ug | Freq: Once | INTRAMUSCULAR | Status: AC
  Administered 2012-04-18: 300 ug via SUBCUTANEOUS
  Filled 2012-04-18: qty 0.6

## 2012-04-20 ENCOUNTER — Ambulatory Visit (INDEPENDENT_AMBULATORY_CARE_PROVIDER_SITE_OTHER): Payer: Medicare Other | Admitting: Physician Assistant

## 2012-04-20 ENCOUNTER — Ambulatory Visit (INDEPENDENT_AMBULATORY_CARE_PROVIDER_SITE_OTHER): Payer: Medicare Other | Admitting: *Deleted

## 2012-04-20 ENCOUNTER — Encounter: Payer: Self-pay | Admitting: Physician Assistant

## 2012-04-20 ENCOUNTER — Encounter: Payer: Self-pay | Admitting: Internal Medicine

## 2012-04-20 VITALS — BP 140/80 | HR 84 | Ht 60.0 in | Wt 105.0 lb

## 2012-04-20 DIAGNOSIS — R079 Chest pain, unspecified: Secondary | ICD-10-CM

## 2012-04-20 DIAGNOSIS — I441 Atrioventricular block, second degree: Secondary | ICD-10-CM

## 2012-04-20 DIAGNOSIS — I2510 Atherosclerotic heart disease of native coronary artery without angina pectoris: Secondary | ICD-10-CM

## 2012-04-20 DIAGNOSIS — R0602 Shortness of breath: Secondary | ICD-10-CM

## 2012-04-20 DIAGNOSIS — I10 Essential (primary) hypertension: Secondary | ICD-10-CM

## 2012-04-20 LAB — PACEMAKER DEVICE OBSERVATION
ATRIAL PACING PM: 37
BAMS-0001: 150 {beats}/min
RV LEAD IMPEDENCE PM: 475 Ohm
RV LEAD THRESHOLD: 0.75 V
VENTRICULAR PACING PM: 100

## 2012-04-20 LAB — BASIC METABOLIC PANEL
Chloride: 102 mEq/L (ref 96–112)
GFR: 40.2 mL/min — ABNORMAL LOW (ref >60.00)
Potassium: 4 mEq/L (ref 3.5–5.1)
Sodium: 140 mEq/L (ref 135–145)

## 2012-04-20 LAB — BRAIN NATRIURETIC PEPTIDE: Pro B Natriuretic peptide (BNP): 113 pg/mL — ABNORMAL HIGH (ref 0.0–100.0)

## 2012-04-20 MED ORDER — AMLODIPINE BESYLATE 2.5 MG PO TABS: 2.5000 mg | ORAL_TABLET | Freq: Every day | ORAL | Status: DC

## 2012-04-20 NOTE — Progress Notes (Signed)
[address], [address] [place], [place]  [address] Phone: [phone], Fax:  [phone]  Date:  04/20/2012   Name:  [name]   DOB:  1924/03/14   MRN:  [id number]  PCP:  Danise Edge, MD  Primary Cardiologist:  Dr. Sherryl Manges  Primary Electrophysiologist:  Dr. Sherryl Manges    History of Present Illness: [name] is a 77 y.o. female who returns for followup after a recent admission to the hospital for chest pain.  She has a hx of CAD, s/p Taxus DES to the oD1 in 6/09 and residual 70% in prox right PL branch treated medically, bradycardia, s/p Medtronic dual chamber pacer in 6/09, DM2, HTN, HL, hypothyroidism, carotid stenosis, RICA 50% by MRA in 2009, CKD, bladder CA, myelodysplastic syndrome (followed by Dr. Cyndie Chime), PUD, asthmatic bronchitis.  Carotid dopplers 10/12: RICA 40-59%.  Echo 8/12: Mild LVH, EF 60-65%, grade 1 diast dysfxn, mild MR.  Last myoview 5/13:  EF 69%, no ischemia.    She was admitted 11/11-11/13 with intermittent sharp and shooting chest discomfort. She ruled out for myocardial infarction by enzymes. Given her recent negative Myoview, continued medical therapy was recommended. Since discharge, she's had occasional chest pains. These are very atypical. She has a lot of discomfort around her pacemaker site. She denies any redness or fevers. Most troubling for her is dyspnea with exertion. She probably describes class IIb-III symptoms. She sleeps on 2 pillows. She denies PND. She denies edema. She denies syncope.  Labs (10/13):   TSH 0.13, K 4.5, creatinine 1.2 Labs (11/13):   K 3.6, creatinine 1.25, Hgb 9.5 Labs (12/13):   Hgb 10.2  Wt Readings from Last 3 Encounters:  04/20/12 105 lb (47.628 kg)  03/27/12 112 lb 10.5 oz (51.1 kg)  02/29/12 108 lb (48.988 kg)     Past Medical History  Diagnosis Date  . CAD (coronary artery disease)     a. 10/2007 PCI Diag w/ 2.25 x 8mm Taxus Atom DES.;  b. 09/2011 Lexi MV: EF 69%, no  ischemia/infarct.  Marland Kitchen PVD (peripheral vascular disease)   . Carotid artery stenosis   . HTN (hypertension)   . Hyperlipidemia   . Hypothyroidism   . CKD (chronic kidney disease), stage III   . Diabetes mellitus 07/16/2011  . Allergic state 07/16/2011  . Insomnia 07/16/2011  . Recurrent UTI 07/16/2011  . Asthma     childhood, recurrent at 30  . Myelodysplastic syndrome 04/02/2011  . Sinusitis 07/18/2011  . Anemia 07/18/2011  . Arthritis 07/18/2011  . Cataract extraction status of right eye 08/18/2011  . Anxiety and depression 08/18/2011  . Bronchitis, acute 12/20/2011  . Headache 03/02/2012  . AV heart block     a. 10/2007 MDT Versa dual chamber PPM.  . Bladder cancer 12/2002    Current Outpatient Prescriptions  Medication Sig Dispense Refill  . acetaminophen (TYLENOL) 500 MG tablet Take 500 mg by mouth every 6 (six) hours as needed. For pain      . aspirin 81 MG tablet Take 81 mg by mouth daily.        . Darbepoetin Alfa-Albumin (ARANESP IJ) Inject as directed.      . fesoterodine (TOVIAZ) 4 MG TB24 Take 4 mg by mouth daily.      Marland Kitchen levothyroxine (SYNTHROID, LEVOTHROID) 100 MCG tablet Take 1 tablet (100 mcg total) by mouth daily.  30 tablet  2  . metFORMIN (GLUMETZA) 1000 MG (MOD) 24 hr tablet Take 1 tablet (1,000  mg total) by mouth 2 (two) times daily with a meal.  60 tablet  4  . Mometasone Furo-Formoterol Fum (DULERA) 200-5 MCG/ACT AERO Inhale 2 puffs into the lungs 3 (three) times daily as needed. For shortness of breath      . montelukast (SINGULAIR) 10 MG tablet Take 10 mg by mouth as needed.       . nitroGLYCERIN (NITROSTAT) 0.4 MG SL tablet Place 1 tablet (0.4 mg total) under the tongue every 5 (five) minutes x 3 doses as needed for chest pain.  25 tablet  3  . PARoxetine (PAXIL) 20 MG tablet Take 1 tablet (20 mg total) by mouth every morning.  30 tablet  2  . rosuvastatin (CRESTOR) 20 MG tablet Take 20 mg by mouth daily.          Allergies: Allergies  Allergen Reactions  . Codeine      Unknown  . Penicillins     Unknown  . Sulfonamide Derivatives     Unknown    Social History:  The patient  reports that she has never smoked. She has never used smokeless tobacco. She reports that she does not drink alcohol or use illicit drugs.   ROS:  Please see the history of present illness.   She has occasional dizziness that she relates to her vertigo. She has a chronic dry cough. She's noted some weight loss.   All other systems reviewed and negative.   PHYSICAL EXAM: VS:  BP 140/80  Pulse 84  Ht 5' (1.524 m)  Wt 1405 lb (637.304 kg)  BMI 274.40 kg/m2  Repeat blood pressure by me 150/80  Well nourished, well developed, in no acute distress HEENT: normal Neck: no JVD Cardiac:  normal S1, S2; RRR; no murmur Chest:  Pacer site stable without erythema; overlying skin is thin Lungs:  Decreased breath sounds bilaterally, dry crackles, worse at the bases, bilaterally Abd: soft, nontender, no hepatomegaly Ext: no edema Skin: warm and dry Neuro:  CNs 2-12 intact, no focal abnormalities noted  EKG:  AV paced, HR 84      ASSESSMENT AND PLAN:  1. Chest Pain:   Overall, this is atypical. She had a recent low risk Myoview. She ruled out for myocardial infarction by enzymes during her recent admission. I suspect some of her chest pain is explained by the discomfort she has about her pacer.  No signs of infection.  But, she does have thinning skin over her pacer without much adipose tissue. Not sure there is much to do about this.  I am not convinced any of her symptoms are anginal.    2. Dyspnea:   Her breathing is worse.  She does have evidence of mild diastolic dysfunction on echo.  BP is elevated.  However, she does not appear to be volume overloaded.  She does have evidence of ILD on CXR and Chest CT in the past.  She has seen Dr. Maple Hudson in th past.      -  Start Norvasc 2.5 mg QD.    -  Check BMET and BNP.  Add Lasix if BNP is elevated.    -  If BNP normal and no significant  improvement in symptoms with better BP control, consider referral back to pulmonary.  3. Coronary Artery Disease:   As noted, chest pain not clearly cardiac.  She had a recent low risk myoview.  Medical Rx will be continued.  Amlodipine is added as noted above.    4. Hypertension:  Add Amlodipine.  5. S/p Pacer:   Follow up with Dr. Sherryl Manges.  6. Disposition:  Plan follow up with Dr. Sherryl Manges in 1 month.    Luna Glasgow, PA-C  11:33 AM 04/20/2012

## 2012-04-20 NOTE — Patient Instructions (Addendum)
Your physician recommends that you schedule a follow-up appointment in: 1 MONTH WITH DR. Graciela Husbands PER SCOTT WEAVER, PAC  LAB TODAY BMET, BNP  START NORVASC 2.5 MG 1 TABLET DAILY

## 2012-04-20 NOTE — Progress Notes (Signed)
PPM check 

## 2012-04-21 ENCOUNTER — Telehealth: Payer: Self-pay | Admitting: *Deleted

## 2012-04-21 NOTE — Telephone Encounter (Signed)
Spoke with patient and let her know that her hemoglobin is stable at 10.2.  She appreciated the phone call.

## 2012-04-21 NOTE — Telephone Encounter (Signed)
lmptcb about lab results. I will try again later

## 2012-04-21 NOTE — Telephone Encounter (Signed)
Message copied by Orbie Hurst on Fri Apr 21, 2012  3:38 PM ------      Message from: Levert Feinstein      Created: Tue Apr 18, 2012 12:56 PM       Call pt hemoglobin stable at 10.2

## 2012-04-21 NOTE — Telephone Encounter (Signed)
Message copied by Tarri Fuller on Fri Apr 21, 2012  1:05 PM ------      Message from: [place], [place] T      Created: Thu Apr 20, 2012  5:16 PM       Creatinine stable      BNP is not that significantly elevated.      Given overall picture, I would prefer she pursue treatment of lung issues first.      I am not convinced that Lasix would make her feel better.  May make her dehydrated/feel worse.      Follow up as planned.      Tereso Newcomer, PA-C  5:15 PM 04/20/2012

## 2012-04-24 NOTE — Telephone Encounter (Signed)
pt notified about lab results and to make sure to f/u w/PCP about lung issues per Bing Neighbors. PAC. pt verbalized understanding today

## 2012-04-26 ENCOUNTER — Encounter: Payer: Self-pay | Admitting: Family Medicine

## 2012-04-26 ENCOUNTER — Ambulatory Visit (INDEPENDENT_AMBULATORY_CARE_PROVIDER_SITE_OTHER): Payer: Medicare Other | Admitting: Family Medicine

## 2012-04-26 VITALS — BP 112/74 | HR 77 | Temp 97.3°F | Ht 60.0 in | Wt 105.8 lb

## 2012-04-26 DIAGNOSIS — R0602 Shortness of breath: Secondary | ICD-10-CM

## 2012-04-26 DIAGNOSIS — E119 Type 2 diabetes mellitus without complications: Secondary | ICD-10-CM

## 2012-04-26 DIAGNOSIS — I10 Essential (primary) hypertension: Secondary | ICD-10-CM

## 2012-04-26 DIAGNOSIS — I2510 Atherosclerotic heart disease of native coronary artery without angina pectoris: Secondary | ICD-10-CM

## 2012-04-26 DIAGNOSIS — R351 Nocturia: Secondary | ICD-10-CM

## 2012-04-26 DIAGNOSIS — N189 Chronic kidney disease, unspecified: Secondary | ICD-10-CM

## 2012-04-26 DIAGNOSIS — N390 Urinary tract infection, site not specified: Secondary | ICD-10-CM

## 2012-04-26 LAB — RENAL FUNCTION PANEL
Chloride: 100 mEq/L (ref 96–112)
GFR: 38.52 mL/min — ABNORMAL LOW (ref >60.00)
Glucose, Bld: 165 mg/dL — ABNORMAL HIGH (ref 70–99)
Phosphorus: 3.8 mg/dL (ref 2.3–4.6)
Potassium: 5.1 mEq/L (ref 3.5–5.1)
Sodium: 141 mEq/L (ref 135–145)

## 2012-04-26 LAB — TSH: TSH: 0.84 u[IU]/mL (ref 0.35–5.50)

## 2012-04-26 LAB — HEMOGLOBIN A1C: Hgb A1c MFr Bld: 6.5 % (ref 4.6–6.5)

## 2012-04-26 LAB — LIPID PANEL
Cholesterol: 134 mg/dL (ref 0–200)
LDL Cholesterol: 42 mg/dL (ref 0–99)
Total CHOL/HDL Ratio: 2
Triglycerides: 167 mg/dL — ABNORMAL HIGH (ref 0.0–149.0)
VLDL: 33.4 mg/dL (ref 0.0–40.0)

## 2012-04-26 LAB — CBC
MCHC: 32.5 g/dL (ref 30.0–36.0)
MCV: 98.9 fl (ref 78.0–100.0)
Platelets: 148 10*3/uL — ABNORMAL LOW (ref 150.0–400.0)
RBC: 3.51 Mil/uL — ABNORMAL LOW (ref 3.87–5.11)

## 2012-04-26 LAB — HEPATIC FUNCTION PANEL
ALT: 22 U/L (ref 0–35)
AST: 25 U/L (ref 0–37)
Bilirubin, Direct: 0 mg/dL (ref 0.0–0.3)
Total Bilirubin: 0.8 mg/dL (ref 0.3–1.2)

## 2012-04-26 MED ORDER — FESOTERODINE FUMARATE ER 4 MG PO TB24: 8.0000 mg | ORAL_TABLET | Freq: Every day | ORAL | Status: DC

## 2012-04-26 NOTE — Patient Instructions (Signed)
Moist heat. Try Tylenol or Aspercreme  Back Pain, Adult Low back pain is very common. About 1 in 5 people have back pain.The cause of low back pain is rarely dangerous. The pain often gets better over time.About half of people with a sudden onset of back pain feel better in just 2 weeks. About 8 in 10 people feel better by 6 weeks.  CAUSES Some common causes of back pain include:  Strain of the muscles or ligaments supporting the spine.  Wear and tear (degeneration) of the spinal discs.  Arthritis.  Direct injury to the back. DIAGNOSIS Most of the time, the direct cause of low back pain is not known.However, back pain can be treated effectively even when the exact cause of the pain is unknown.Answering your caregiver's questions about your overall health and symptoms is one of the most accurate ways to make sure the cause of your pain is not dangerous. If your caregiver needs more information, he or she may order lab work or imaging tests (X-rays or MRIs).However, even if imaging tests show changes in your back, this usually does not require surgery. HOME CARE INSTRUCTIONS For many people, back pain returns.Since low back pain is rarely dangerous, it is often a condition that people can learn to [facility] their own.   Remain active. It is stressful on the back to sit or stand in one place. Do not sit, drive, or stand in one place for more than 30 minutes at a time. Take short walks on level surfaces as soon as pain allows.Try to increase the length of time you walk each day.  Do not stay in bed.Resting more than 1 or 2 days can delay your recovery.  Do not avoid exercise or work.Your body is made to move.It is not dangerous to be active, even though your back may hurt.Your back will likely heal faster if you return to being active before your pain is gone.  Pay attention to your body when you bend and lift. Many people have less discomfortwhen lifting if they bend their knees,  keep the load close to their bodies,and avoid twisting. Often, the most comfortable positions are those that put less stress on your recovering back.  Find a comfortable position to sleep. Use a firm mattress and lie on your side with your knees slightly bent. If you lie on your back, put a pillow under your knees.  Only take over-the-counter or prescription medicines as directed by your caregiver. Over-the-counter medicines to reduce pain and inflammation are often the most helpful.Your caregiver may prescribe muscle relaxant drugs.These medicines help dull your pain so you can more quickly return to your normal activities and healthy exercise.  Put ice on the injured area.  Put ice in a plastic bag.  Place a towel between your skin and the bag.  Leave the ice on for 15 to 20 minutes, 3 to 4 times a day for the first 2 to 3 days. After that, ice and heat may be alternated to reduce pain and spasms.  Ask your caregiver about trying back exercises and gentle massage. This may be of some benefit.  Avoid feeling anxious or stressed.Stress increases muscle tension and can worsen back pain.It is important to recognize when you are anxious or stressed and learn ways to manage it.Exercise is a great option. SEEK MEDICAL CARE IF:  You have pain that is not relieved with rest or medicine.  You have pain that does not improve in 1 week.  You  have new symptoms.  You are generally not feeling well. SEEK IMMEDIATE MEDICAL CARE IF:   You have pain that radiates from your back into your legs.  You develop new bowel or bladder control problems.  You have unusual weakness or numbness in your arms or legs.  You develop nausea or vomiting.  You develop abdominal pain.  You feel faint. Document Released: 05/03/2005 Document Revised: 11/02/2011 Document Reviewed: 09/21/2010 [facility] Patient Information 2013 [place], [place].

## 2012-04-27 ENCOUNTER — Other Ambulatory Visit (INDEPENDENT_AMBULATORY_CARE_PROVIDER_SITE_OTHER): Payer: Medicare Other

## 2012-04-27 DIAGNOSIS — R319 Hematuria, unspecified: Secondary | ICD-10-CM

## 2012-04-27 DIAGNOSIS — R350 Frequency of micturition: Secondary | ICD-10-CM

## 2012-04-27 LAB — POCT URINALYSIS DIPSTICK
Glucose, UA: NEGATIVE
Ketones, UA: NEGATIVE
Spec Grav, UA: 1.015

## 2012-04-27 MED ORDER — LISINOPRIL 2.5 MG PO TABS: 2.5000 mg | ORAL_TABLET | Freq: Every day | ORAL | Status: DC

## 2012-04-27 NOTE — Progress Notes (Signed)
Quick Note:  Patient Informed and voiced understanding ______ 

## 2012-04-28 MED ORDER — LEVOFLOXACIN 250 MG PO TABS: 250.0000 mg | ORAL_TABLET | Freq: Every day | ORAL | Status: DC

## 2012-04-28 NOTE — Progress Notes (Signed)
Quick Note:  Patient Informed and voiced understanding.  RX sent to pharmacy ______ 

## 2012-04-29 LAB — URINE CULTURE: Colony Count: 80000

## 2012-04-30 ENCOUNTER — Encounter: Payer: Self-pay | Admitting: Family Medicine

## 2012-04-30 NOTE — Assessment & Plan Note (Signed)
Recurrent , started on antibiotics today, increase hydration.

## 2012-04-30 NOTE — Assessment & Plan Note (Signed)
Well controlled on current meds no changes 

## 2012-04-30 NOTE — Progress Notes (Signed)
Patient ID: [name], female   DOB: 1924/03/27, 77 y.o.   MRN: [id number] [name] [id number] Mar 05, 1924 04/30/2012      Progress Note-Follow Up  Subjective  Chief Complaint  Chief Complaint  Patient presents with  . Follow-up    from cardiologist    HPI  Patient is an 6 we'll Caucasian female who is here today for followup. She did have a recent trip to the emergency room for some chest pain but that has resolved she's had no further episodes. Her workup at that time was negative. No other recent concerns. She does continue to struggle some fatigue and malaise but no fevers or chills. She denies GI or GU complaints. She denies shortness of breath or palpitations. No congestion.  Past Medical History  Diagnosis Date  . CAD (coronary artery disease)     a. 10/2007 PCI Diag w/ 2.25 x 8mm Taxus Atom DES.;  b. 09/2011 Lexi MV: EF 69%, no ischemia/infarct.  Marland Kitchen PVD (peripheral vascular disease)   . Carotid artery stenosis   . HTN (hypertension)   . Hyperlipidemia   . Hypothyroidism   . CKD (chronic kidney disease), stage III   . Diabetes mellitus 07/16/2011  . Allergic state 07/16/2011  . Insomnia 07/16/2011  . Recurrent UTI 07/16/2011  . Asthma     childhood, recurrent at 27  . Myelodysplastic syndrome 04/02/2011  . Sinusitis 07/18/2011  . Anemia 07/18/2011  . Arthritis 07/18/2011  . Cataract extraction status of right eye 08/18/2011  . Anxiety and depression 08/18/2011  . Bronchitis, acute 12/20/2011  . Headache 03/02/2012  . AV heart block     a. 10/2007 MDT Versa dual chamber PPM.  . Bladder cancer 12/2002    Past Surgical History  Procedure Date  . Cholecystectomy   . Pcm - medtronic   . Abdominal hysterectomy   . Shoulder open rotator cuff repair   . Foot surgery     bunionectomy    Family History  Problem Relation Age of Onset  . Heart disease Mother     chf  . Arthritis Mother   . Arthritis Father   . Kidney disease Father   . Hypertension Father   . Cancer  Brother     lung  . Arthritis Daughter     MVA with very serious injuries    History   Social History  . Marital Status: Widowed    Spouse Name: N/A    Number of Children: N/A  . Years of Education: N/A   Occupational History  . Not on file.   Social History Main Topics  . Smoking status: Never Smoker   . Smokeless tobacco: Never Used  . Alcohol Use: No  . Drug Use: No  . Sexually Active: No   Other Topics Concern  . Not on file   Social History Narrative  . No narrative on file    Current Outpatient Prescriptions on File Prior to Visit  Medication Sig Dispense Refill  . acetaminophen (TYLENOL) 500 MG tablet Take 500 mg by mouth every 6 (six) hours as needed. For pain      . amLODipine (NORVASC) 2.5 MG tablet Take 1 tablet (2.5 mg total) by mouth daily.  30 tablet  11  . aspirin 81 MG tablet Take 81 mg by mouth daily.        . Darbepoetin Alfa-Albumin (ARANESP IJ) Inject as directed.      Marland Kitchen levothyroxine (SYNTHROID, LEVOTHROID) 100 MCG tablet Take 1 tablet (  100 mcg total) by mouth daily.  30 tablet  2  . lisinopril (PRINIVIL,ZESTRIL) 2.5 MG tablet Take 1 tablet (2.5 mg total) by mouth daily.  30 tablet  0  . metFORMIN (GLUMETZA) 1000 MG (MOD) 24 hr tablet Take 1 tablet (1,000 mg total) by mouth 2 (two) times daily with a meal.  60 tablet  4  . Mometasone Furo-Formoterol Fum (DULERA) 200-5 MCG/ACT AERO Inhale 2 puffs into the lungs 3 (three) times daily as needed. For shortness of breath      . montelukast (SINGULAIR) 10 MG tablet Take 10 mg by mouth as needed.       . nitroGLYCERIN (NITROSTAT) 0.4 MG SL tablet Place 1 tablet (0.4 mg total) under the tongue every 5 (five) minutes x 3 doses as needed for chest pain.  25 tablet  3  . PARoxetine (PAXIL) 20 MG tablet Take 1 tablet (20 mg total) by mouth every morning.  30 tablet  2  . rosuvastatin (CRESTOR) 20 MG tablet Take 20 mg by mouth daily.          Allergies  Allergen Reactions  . Codeine     Unknown  .  Penicillins     Unknown  . Sulfonamide Derivatives     Unknown    Review of Systems  Review of Systems  Constitutional: Negative for fever and malaise/fatigue.  HENT: Negative for congestion.   Eyes: Negative for discharge.  Respiratory: Negative for shortness of breath.   Cardiovascular: Negative for chest pain, palpitations and leg swelling.  Gastrointestinal: Negative for nausea, abdominal pain and diarrhea.  Genitourinary: Negative for dysuria.  Musculoskeletal: Negative for falls.  Skin: Negative for rash.  Neurological: Negative for loss of consciousness and headaches.  Endo/Heme/Allergies: Negative for polydipsia.  Psychiatric/Behavioral: Negative for depression and suicidal ideas. The patient is not nervous/anxious and does not have insomnia.     Objective  BP 112/74  Pulse 77  Temp 97.3 F (36.3 C) (Temporal)  Ht 5' (1.524 m)  Wt 105 lb 12.8 oz (47.991 kg)  BMI 20.66 kg/m2  Physical Exam  Physical Exam  Constitutional: She is oriented to person, place, and time and well-developed, well-nourished, and in no distress. No distress.  HENT:  Head: Normocephalic and atraumatic.  Eyes: Conjunctivae normal are normal.  Neck: Neck supple. No thyromegaly present.  Cardiovascular: Normal rate, regular rhythm and normal heart sounds.   Pulmonary/Chest: Effort normal and breath sounds normal. She has no wheezes.  Abdominal: She exhibits no distension and no mass.  Musculoskeletal: She exhibits no edema.  Lymphadenopathy:    She has no cervical adenopathy.  Neurological: She is alert and oriented to person, place, and time.  Skin: Skin is warm and dry. No rash noted. She is not diaphoretic.  Psychiatric: Memory, affect and judgment normal.    Lab Results  Component Value Date   TSH 0.84 04/26/2012   Lab Results  Component Value Date   WBC 11.2* 04/26/2012   HGB 11.3* 04/26/2012   HCT 34.7* 04/26/2012   MCV 98.9 04/26/2012   PLT 148.0* 04/26/2012   Lab  Results  Component Value Date   CREATININE 1.4* 04/26/2012   BUN 21 04/26/2012   NA 141 04/26/2012   K 5.1 04/26/2012   CL 100 04/26/2012   CO2 29 04/26/2012   Lab Results  Component Value Date   ALT 22 04/26/2012   AST 25 04/26/2012   ALKPHOS 49 04/26/2012   BILITOT 0.8 04/26/2012   Lab Results  Component Value Date   CHOL 134 04/26/2012   Lab Results  Component Value Date   HDL 58.60 04/26/2012   Lab Results  Component Value Date   LDLCALC 42 04/26/2012   Lab Results  Component Value Date   TRIG 167.0* 04/26/2012   Lab Results  Component Value Date   CHOLHDL 2 04/26/2012     Assessment & Plan  Recurrent UTI Recurrent , started on antibiotics today, increase hydration.   HYPERTENSION, UNSPECIFIED Well controlled on current meds no changes  RENAL FAILURE, CHRONIC Worse with UTi, increase hydration and reassess next week

## 2012-04-30 NOTE — Assessment & Plan Note (Signed)
Worse with UTi, increase hydration and reassess next week

## 2012-05-01 MED ORDER — NITROFURANTOIN MONOHYD MACRO 100 MG PO CAPS: 100.0000 mg | ORAL_CAPSULE | Freq: Two times a day (BID) | ORAL | Status: DC

## 2012-05-01 NOTE — Progress Notes (Signed)
Quick Note:  Patient Informed and voiced understanding.  RX sent to pharmacy ______ 

## 2012-05-01 NOTE — Progress Notes (Signed)
Quick Note:  Patient Informed and voiced understanding ______ 

## 2012-05-01 NOTE — Addendum Note (Signed)
Addended by: Court Joy on: 05/01/2012 04:31 PM   Modules accepted: Orders

## 2012-05-02 ENCOUNTER — Telehealth: Payer: Self-pay | Admitting: *Deleted

## 2012-05-02 ENCOUNTER — Ambulatory Visit: Payer: Medicare Other | Admitting: Nurse Practitioner

## 2012-05-02 NOTE — Telephone Encounter (Signed)
Patient called and left message to cancel appointment.  She was getting ready to come and was hit suddenly  by nausea,vomiting and diarrhea.  Called her back and let her know that we would cancel today and r/s for either later this week or early next week - whenever she is feeling better. Patient lives by herself.  Warned her to watch for dehydation with these sx.  She has a life alert button and feels very comfortable with this.

## 2012-05-03 ENCOUNTER — Telehealth: Payer: Self-pay | Admitting: Oncology

## 2012-05-03 NOTE — Telephone Encounter (Signed)
S/W THE PT'S DTR AND SHE IS AWARE THAT THE PT NEEDS TO R/S HER APPT THAT SHE MISSED ON 05/02/2012. PER THE PT'S DTR SHE WILL HVE THE PT CALL BACK TO R/S THE APPT

## 2012-05-04 ENCOUNTER — Telehealth: Payer: Self-pay | Admitting: Oncology

## 2012-05-04 NOTE — Telephone Encounter (Signed)
Pt called and r/s appt to 12/30 lab.ML and injection, nurse notified

## 2012-05-09 ENCOUNTER — Ambulatory Visit: Payer: Medicare Other

## 2012-05-09 ENCOUNTER — Other Ambulatory Visit: Payer: Medicare Other | Admitting: Lab

## 2012-05-09 ENCOUNTER — Ambulatory Visit: Payer: Medicare Other | Admitting: Nurse Practitioner

## 2012-05-12 ENCOUNTER — Other Ambulatory Visit: Payer: Self-pay

## 2012-05-12 ENCOUNTER — Telehealth: Payer: Self-pay

## 2012-05-12 ENCOUNTER — Ambulatory Visit (INDEPENDENT_AMBULATORY_CARE_PROVIDER_SITE_OTHER): Payer: Medicare Other | Admitting: Family Medicine

## 2012-05-12 ENCOUNTER — Other Ambulatory Visit: Payer: Medicare Other

## 2012-05-12 ENCOUNTER — Telehealth: Payer: Self-pay | Admitting: Family Medicine

## 2012-05-12 DIAGNOSIS — R350 Frequency of micturition: Secondary | ICD-10-CM

## 2012-05-12 MED ORDER — CEFDINIR 300 MG PO CAPS: 300.0000 mg | ORAL_CAPSULE | Freq: Two times a day (BID) | ORAL | Status: DC

## 2012-05-12 NOTE — Progress Notes (Signed)
Patient ID: [name], female   DOB: Dec 01, 1923, 78 y.o.   MRN: [id number] Patient unable to keep appt due to family concerns, is c/o some urinary frequency, will proceed with UA C&S today, order placed

## 2012-05-12 NOTE — Telephone Encounter (Signed)
Pt informed and RX sent to pharmacy  

## 2012-05-12 NOTE — Telephone Encounter (Signed)
Pt states she is still having trouble when urinating with pressure and pain. Pt states she still has the "cough" also. Pt is coming in today at 3:45.

## 2012-05-12 NOTE — Telephone Encounter (Signed)
Patient LM asking for return phone call.

## 2012-05-13 LAB — URINALYSIS W MICROSCOPIC + REFLEX CULTURE
Bilirubin Urine: NEGATIVE
Casts: NONE SEEN
Crystals: NONE SEEN
Glucose, UA: NEGATIVE mg/dL
Specific Gravity, Urine: 1.011 (ref 1.005–1.030)
Squamous Epithelial / LPF: NONE SEEN
pH: 5.5 (ref 5.0–8.0)

## 2012-05-14 LAB — URINE CULTURE

## 2012-05-15 ENCOUNTER — Other Ambulatory Visit (HOSPITAL_BASED_OUTPATIENT_CLINIC_OR_DEPARTMENT_OTHER): Payer: Medicare Other | Admitting: Lab

## 2012-05-15 ENCOUNTER — Telehealth: Payer: Self-pay | Admitting: Oncology

## 2012-05-15 ENCOUNTER — Ambulatory Visit (HOSPITAL_BASED_OUTPATIENT_CLINIC_OR_DEPARTMENT_OTHER): Payer: Medicare Other | Admitting: Nurse Practitioner

## 2012-05-15 ENCOUNTER — Ambulatory Visit: Payer: Medicare Other

## 2012-05-15 VITALS — BP 151/66 | HR 81 | Temp 97.2°F | Resp 18 | Ht 60.0 in | Wt 104.7 lb

## 2012-05-15 DIAGNOSIS — D469 Myelodysplastic syndrome, unspecified: Secondary | ICD-10-CM

## 2012-05-15 LAB — CBC WITH DIFFERENTIAL/PLATELET
BASO%: 0.6 % (ref 0.0–2.0)
EOS%: 7.2 % — ABNORMAL HIGH (ref 0.0–7.0)
HCT: 34.8 % (ref 34.8–46.6)
HGB: 10.8 g/dL — ABNORMAL LOW (ref 11.6–15.9)
MONO%: 11.3 % (ref 0.0–14.0)
NEUT#: 7.6 10*3/uL — ABNORMAL HIGH (ref 1.5–6.5)
NEUT%: 62.9 % (ref 38.4–76.8)
Platelets: 149 10*3/uL (ref 145–400)
RBC: 3.45 10*6/uL — ABNORMAL LOW (ref 3.70–5.45)
RDW: 16.6 % — ABNORMAL HIGH (ref 11.2–14.5)
WBC: 12 10*3/uL — ABNORMAL HIGH (ref 3.9–10.3)
lymph#: 2.2 10*3/uL (ref 0.9–3.3)

## 2012-05-15 MED ORDER — DARBEPOETIN ALFA-POLYSORBATE 300 MCG/0.6ML IJ SOLN
300.0000 ug | Freq: Once | INTRAMUSCULAR | Status: AC
  Administered 2012-05-15: 300 ug via SUBCUTANEOUS
  Filled 2012-05-15: qty 0.6

## 2012-05-15 NOTE — Telephone Encounter (Signed)
appts made and printed for pt  °

## 2012-05-15 NOTE — Progress Notes (Signed)
OFFICE PROGRESS NOTE  Interval history:  Ms. Norell is an 78 year old woman with MDS maintained on intermittent Aranesp injections to maintain the hemoglobin greater than 10 g. She is seen today for scheduled followup.  She reports she is currently on antibiotics for a urinary tract infection. Energy level continues to be poor. She has stable dyspnea on exertion. Appetite is beginning to improve.   Objective: Blood pressure 151/66, pulse 81, temperature 97.2 F (36.2 C), temperature source Oral, resp. rate 18, height 5' (1.524 m), weight 104 lb 11.2 oz (47.492 kg).  Oropharynx is without thrush or ulceration. No palpable cervical or supraclavicular lymph nodes. Inspiratory rales at both lung bases. Regular cardiac rhythm. Abdomen is soft and nontender. No organomegaly. Extremities are without edema. Calves are soft and nontender.  Lab Results: Lab Results  Component Value Date   WBC 12.0* 05/15/2012   HGB 10.8* 05/15/2012   HCT 34.8 05/15/2012   MCV 100.9 05/15/2012   PLT 149 05/15/2012    Chemistry:    Chemistry      Component Value Date/Time   NA 141 04/26/2012 1523   K 5.1 04/26/2012 1523   CL 100 04/26/2012 1523   CO2 29 04/26/2012 1523   BUN 21 04/26/2012 1523   CREATININE 1.4* 04/26/2012 1523      Component Value Date/Time   CALCIUM 9.7 04/26/2012 1523   ALKPHOS 49 04/26/2012 1523   AST 25 04/26/2012 1523   ALT 22 04/26/2012 1523   BILITOT 0.8 04/26/2012 1523       Studies/Results: No results found.  Medications: I have reviewed the patient's current medications.  Assessment/Plan:  1. Myelodysplastic syndrome currently receiving intermittent Aranesp injections to maintain the hemoglobin at greater than 10 g. 2. Mild chronic renal insufficiency. 3. Coronary artery disease with history of third degree heart block requiring permanent pacemaker and status post drug-eluting stent to the left anterior descending coronary artery. 4. Hypothyroid on  replacement. 5. Hyperlipidemia. 6. Hypertension. 7. Type 2 diabetes.  Disposition-Ms. Truax remains stable from a hematologic standpoint. We will continue to check labs on a 3 week schedule with Aranesp injections as indicated. She will return for a followup visit in 6 months.   CC Dr. Darrow Bussing, Dr. Ellene Route ANP/GNP-BC

## 2012-05-16 ENCOUNTER — Ambulatory Visit: Payer: Medicare Other

## 2012-05-19 ENCOUNTER — Telehealth: Payer: Self-pay

## 2012-05-19 MED ORDER — CEFDINIR 300 MG PO CAPS: 300.0000 mg | ORAL_CAPSULE | Freq: Two times a day (BID) | ORAL | Status: DC

## 2012-05-19 NOTE — Telephone Encounter (Signed)
Sent RX and left a message for patient to return my call.

## 2012-05-19 NOTE — Telephone Encounter (Signed)
Would like a new UA with reflex c & s and will treat as well. If the last antibiotic helped some we can just give one refill on that while we await the culture results

## 2012-05-19 NOTE — Telephone Encounter (Signed)
Pt informed and will come in Monday to give [name] a urine sample.

## 2012-05-19 NOTE — Telephone Encounter (Signed)
Patient left a message stating that she is feeling a little better but is still having some burning during urination. Should pt take another round of anitbiotics, need another sample, or does pt need an appt? Please advise?

## 2012-05-24 ENCOUNTER — Other Ambulatory Visit: Payer: Self-pay | Admitting: Family Medicine

## 2012-05-24 ENCOUNTER — Other Ambulatory Visit (INDEPENDENT_AMBULATORY_CARE_PROVIDER_SITE_OTHER): Payer: Medicare Other

## 2012-05-24 DIAGNOSIS — R319 Hematuria, unspecified: Secondary | ICD-10-CM

## 2012-05-24 DIAGNOSIS — R350 Frequency of micturition: Secondary | ICD-10-CM

## 2012-05-24 LAB — POCT URINALYSIS DIPSTICK
Bilirubin, UA: NEGATIVE
Ketones, UA: NEGATIVE
Nitrite, UA: NEGATIVE
Protein, UA: 100
pH, UA: 5.5

## 2012-05-24 NOTE — Progress Notes (Signed)
Quick Note:  Patient Informed and voiced understanding.  Pt states she already has a urologist and will call to schedule an appt ______

## 2012-05-24 NOTE — Progress Notes (Signed)
Labs only

## 2012-05-25 LAB — URINE CULTURE: Organism ID, Bacteria: NO GROWTH

## 2012-05-26 ENCOUNTER — Inpatient Hospital Stay (HOSPITAL_COMMUNITY)
Admission: EM | Admit: 2012-05-26 | Discharge: 2012-05-31 | DRG: 481 | Disposition: A | Discharge status: Rehabilitation Facility | Payer: Medicare Other | Attending: Internal Medicine | Admitting: Internal Medicine

## 2012-05-26 ENCOUNTER — Emergency Department (HOSPITAL_COMMUNITY): Payer: Medicare Other

## 2012-05-26 ENCOUNTER — Encounter (HOSPITAL_COMMUNITY): Payer: Self-pay | Admitting: *Deleted

## 2012-05-26 DIAGNOSIS — M1990 Unspecified osteoarthritis, unspecified site: Secondary | ICD-10-CM

## 2012-05-26 DIAGNOSIS — S72002A Fracture of unspecified part of neck of left femur, initial encounter for closed fracture: Secondary | ICD-10-CM

## 2012-05-26 DIAGNOSIS — E782 Mixed hyperlipidemia: Secondary | ICD-10-CM | POA: Diagnosis present

## 2012-05-26 DIAGNOSIS — D62 Acute posthemorrhagic anemia: Secondary | ICD-10-CM | POA: Diagnosis present

## 2012-05-26 DIAGNOSIS — Z888 Allergy status to other drugs, medicaments and biological substances status: Secondary | ICD-10-CM

## 2012-05-26 DIAGNOSIS — E119 Type 2 diabetes mellitus without complications: Secondary | ICD-10-CM

## 2012-05-26 DIAGNOSIS — Z7901 Long term (current) use of anticoagulants: Secondary | ICD-10-CM

## 2012-05-26 DIAGNOSIS — T7840XA Allergy, unspecified, initial encounter: Secondary | ICD-10-CM

## 2012-05-26 DIAGNOSIS — G4700 Insomnia, unspecified: Secondary | ICD-10-CM

## 2012-05-26 DIAGNOSIS — I129 Hypertensive chronic kidney disease with stage 1 through stage 4 chronic kidney disease, or unspecified chronic kidney disease: Secondary | ICD-10-CM | POA: Diagnosis present

## 2012-05-26 DIAGNOSIS — Z882 Allergy status to sulfonamides status: Secondary | ICD-10-CM

## 2012-05-26 DIAGNOSIS — N10 Acute tubulo-interstitial nephritis: Secondary | ICD-10-CM | POA: Diagnosis present

## 2012-05-26 DIAGNOSIS — N189 Chronic kidney disease, unspecified: Secondary | ICD-10-CM

## 2012-05-26 DIAGNOSIS — E039 Hypothyroidism, unspecified: Secondary | ICD-10-CM

## 2012-05-26 DIAGNOSIS — I6529 Occlusion and stenosis of unspecified carotid artery: Secondary | ICD-10-CM

## 2012-05-26 DIAGNOSIS — I739 Peripheral vascular disease, unspecified: Secondary | ICD-10-CM

## 2012-05-26 DIAGNOSIS — D6959 Other secondary thrombocytopenia: Secondary | ICD-10-CM | POA: Diagnosis present

## 2012-05-26 DIAGNOSIS — W1809XA Striking against other object with subsequent fall, initial encounter: Secondary | ICD-10-CM | POA: Diagnosis present

## 2012-05-26 DIAGNOSIS — J329 Chronic sinusitis, unspecified: Secondary | ICD-10-CM

## 2012-05-26 DIAGNOSIS — F329 Major depressive disorder, single episode, unspecified: Secondary | ICD-10-CM

## 2012-05-26 DIAGNOSIS — Z9841 Cataract extraction status, right eye: Secondary | ICD-10-CM

## 2012-05-26 DIAGNOSIS — N390 Urinary tract infection, site not specified: Secondary | ICD-10-CM

## 2012-05-26 DIAGNOSIS — R0789 Other chest pain: Secondary | ICD-10-CM

## 2012-05-26 DIAGNOSIS — I2510 Atherosclerotic heart disease of native coronary artery without angina pectoris: Secondary | ICD-10-CM | POA: Diagnosis present

## 2012-05-26 DIAGNOSIS — C679 Malignant neoplasm of bladder, unspecified: Secondary | ICD-10-CM

## 2012-05-26 DIAGNOSIS — E785 Hyperlipidemia, unspecified: Secondary | ICD-10-CM

## 2012-05-26 DIAGNOSIS — R42 Dizziness and giddiness: Secondary | ICD-10-CM

## 2012-05-26 DIAGNOSIS — I441 Atrioventricular block, second degree: Secondary | ICD-10-CM

## 2012-05-26 DIAGNOSIS — F3289 Other specified depressive episodes: Secondary | ICD-10-CM | POA: Diagnosis present

## 2012-05-26 DIAGNOSIS — I951 Orthostatic hypotension: Secondary | ICD-10-CM

## 2012-05-26 DIAGNOSIS — I498 Other specified cardiac arrhythmias: Secondary | ICD-10-CM | POA: Diagnosis present

## 2012-05-26 DIAGNOSIS — S72142A Displaced intertrochanteric fracture of left femur, initial encounter for closed fracture: Secondary | ICD-10-CM

## 2012-05-26 DIAGNOSIS — Z7982 Long term (current) use of aspirin: Secondary | ICD-10-CM

## 2012-05-26 DIAGNOSIS — N183 Chronic kidney disease, stage 3 unspecified: Secondary | ICD-10-CM | POA: Diagnosis present

## 2012-05-26 DIAGNOSIS — S72009A Fracture of unspecified part of neck of unspecified femur, initial encounter for closed fracture: Secondary | ICD-10-CM

## 2012-05-26 DIAGNOSIS — Y9229 Other specified public building as the place of occurrence of the external cause: Secondary | ICD-10-CM

## 2012-05-26 DIAGNOSIS — Z8249 Family history of ischemic heart disease and other diseases of the circulatory system: Secondary | ICD-10-CM

## 2012-05-26 DIAGNOSIS — J209 Acute bronchitis, unspecified: Secondary | ICD-10-CM

## 2012-05-26 DIAGNOSIS — I472 Ventricular tachycardia: Secondary | ICD-10-CM

## 2012-05-26 DIAGNOSIS — Z950 Presence of cardiac pacemaker: Secondary | ICD-10-CM

## 2012-05-26 DIAGNOSIS — Z880 Allergy status to penicillin: Secondary | ICD-10-CM

## 2012-05-26 DIAGNOSIS — D469 Myelodysplastic syndrome, unspecified: Secondary | ICD-10-CM

## 2012-05-26 DIAGNOSIS — Z8551 Personal history of malignant neoplasm of bladder: Secondary | ICD-10-CM

## 2012-05-26 DIAGNOSIS — S72143A Displaced intertrochanteric fracture of unspecified femur, initial encounter for closed fracture: Principal | ICD-10-CM | POA: Diagnosis present

## 2012-05-26 DIAGNOSIS — R51 Headache: Secondary | ICD-10-CM

## 2012-05-26 DIAGNOSIS — I10 Essential (primary) hypertension: Secondary | ICD-10-CM

## 2012-05-26 DIAGNOSIS — Z9861 Coronary angioplasty status: Secondary | ICD-10-CM

## 2012-05-26 DIAGNOSIS — D649 Anemia, unspecified: Secondary | ICD-10-CM

## 2012-05-26 HISTORY — DX: Displaced intertrochanteric fracture of left femur, initial encounter for closed fracture: S72.142A

## 2012-05-26 LAB — BASIC METABOLIC PANEL
GFR calc Af Amer: 49 mL/min — ABNORMAL LOW (ref >90)
GFR calc non Af Amer: 42 mL/min — ABNORMAL LOW (ref >90)
Potassium: 4.8 mEq/L (ref 3.5–5.1)
Sodium: 141 mEq/L (ref 135–145)

## 2012-05-26 LAB — POCT I-STAT TROPONIN I: Troponin i, poc: 0 ng/mL (ref 0.00–0.08)

## 2012-05-26 LAB — URINE MICROSCOPIC-ADD ON

## 2012-05-26 LAB — CBC WITH DIFFERENTIAL/PLATELET
Basophils Absolute: 0 10*3/uL (ref 0.0–0.1)
Basophils Relative: 0 % (ref 0–1)
Eosinophils Absolute: 0.2 10*3/uL (ref 0.0–0.7)
Eosinophils Relative: 2 % (ref 0–5)
HCT: 29.2 % — ABNORMAL LOW (ref 36.0–46.0)
Hemoglobin: 9.4 g/dL — ABNORMAL LOW (ref 12.0–15.0)
Lymphocytes Relative: 8 % — ABNORMAL LOW (ref 12–46)
Lymphs Abs: 0.8 K/uL (ref 0.7–4.0)
MCH: 32.1 pg (ref 26.0–34.0)
MCHC: 32.2 g/dL (ref 30.0–36.0)
MCV: 99.7 fL (ref 78.0–100.0)
Monocytes Absolute: 0.6 K/uL (ref 0.1–1.0)
Monocytes Relative: 6 % (ref 3–12)
Neutro Abs: 8.1 K/uL — ABNORMAL HIGH (ref 1.7–7.7)
Neutrophils Relative %: 83 % — ABNORMAL HIGH (ref 43–77)
Platelets: 132 10*3/uL — ABNORMAL LOW (ref 150–400)
RBC: 2.93 MIL/uL — ABNORMAL LOW (ref 3.87–5.11)
RDW: 16.1 % — ABNORMAL HIGH (ref 11.5–15.5)
WBC: 9.8 K/uL (ref 4.0–10.5)

## 2012-05-26 LAB — URINALYSIS, ROUTINE W REFLEX MICROSCOPIC
Bilirubin Urine: NEGATIVE
Glucose, UA: NEGATIVE mg/dL
Ketones, ur: NEGATIVE mg/dL
Nitrite: NEGATIVE
Protein, ur: 30 mg/dL — AB
Specific Gravity, Urine: 1.016 (ref 1.005–1.030)
Urobilinogen, UA: 0.2 mg/dL (ref 0.0–1.0)
pH: 5 (ref 5.0–8.0)

## 2012-05-26 LAB — BASIC METABOLIC PANEL WITH GFR
BUN: 20 mg/dL (ref 6–23)
CO2: 24 meq/L (ref 19–32)
Calcium: 8.9 mg/dL (ref 8.4–10.5)
Chloride: 105 meq/L (ref 96–112)
Creatinine, Ser: 1.14 mg/dL — ABNORMAL HIGH (ref 0.50–1.10)
Glucose, Bld: 139 mg/dL — ABNORMAL HIGH (ref 70–99)

## 2012-05-26 MED ORDER — ASPIRIN 81 MG PO CHEW
81.0000 mg | CHEWABLE_TABLET | Freq: Every day | ORAL | Status: DC
  Administered 2012-05-28 – 2012-05-31 (×4): 81 mg via ORAL
  Filled 2012-05-26 (×5): qty 1

## 2012-05-26 MED ORDER — ATORVASTATIN CALCIUM 40 MG PO TABS
40.0000 mg | ORAL_TABLET | Freq: Every day | ORAL | Status: DC
  Administered 2012-05-28 – 2012-05-30 (×3): 40 mg via ORAL
  Filled 2012-05-26 (×5): qty 1

## 2012-05-26 MED ORDER — FENTANYL CITRATE 0.05 MG/ML IJ SOLN
50.0000 ug | Freq: Once | INTRAMUSCULAR | Status: AC
  Administered 2012-05-26: 50 ug via INTRAVENOUS
  Filled 2012-05-26: qty 2

## 2012-05-26 MED ORDER — MORPHINE SULFATE 2 MG/ML IJ SOLN
2.0000 mg | Freq: Once | INTRAMUSCULAR | Status: AC
  Administered 2012-05-26: 2 mg via INTRAVENOUS
  Filled 2012-05-26: qty 1

## 2012-05-26 MED ORDER — CIPROFLOXACIN IN D5W 400 MG/200ML IV SOLN
400.0000 mg | INTRAVENOUS | Status: DC
Start: 2012-05-27 — End: 2012-05-27
  Filled 2012-05-26: qty 200

## 2012-05-26 MED ORDER — LEVOTHYROXINE SODIUM 112 MCG PO TABS
112.0000 ug | ORAL_TABLET | Freq: Every day | ORAL | Status: DC
  Administered 2012-05-27 – 2012-05-31 (×5): 112 ug via ORAL
  Filled 2012-05-26 (×6): qty 1

## 2012-05-26 MED ORDER — NITROGLYCERIN 0.4 MG SL SUBL: 0.4000 mg | SUBLINGUAL_TABLET | SUBLINGUAL | Status: DC | PRN

## 2012-05-26 MED ORDER — INSULIN ASPART 100 UNIT/ML ~~LOC~~ SOLN
0.0000 [IU] | SUBCUTANEOUS | Status: DC
  Administered 2012-05-28: 5 [IU] via SUBCUTANEOUS
  Administered 2012-05-28: 2 [IU] via SUBCUTANEOUS

## 2012-05-26 MED ORDER — MORPHINE SULFATE 2 MG/ML IJ SOLN
0.5000 mg | INTRAMUSCULAR | Status: DC | PRN
  Administered 2012-05-27 (×3): 0.5 mg via INTRAVENOUS
  Filled 2012-05-26 (×3): qty 1

## 2012-05-26 MED ORDER — ASPIRIN 81 MG PO TABS: 81.0000 mg | ORAL_TABLET | Freq: Every day | ORAL | Status: DC

## 2012-05-26 MED ORDER — CIPROFLOXACIN IN D5W 400 MG/200ML IV SOLN
400.0000 mg | Freq: Once | INTRAVENOUS | Status: AC
  Administered 2012-05-26: 400 mg via INTRAVENOUS
  Filled 2012-05-26: qty 200

## 2012-05-26 MED ORDER — SODIUM CHLORIDE 0.9 % IV SOLN
INTRAVENOUS | Status: DC
  Administered 2012-05-27 (×2): via INTRAVENOUS

## 2012-05-26 MED ORDER — AMLODIPINE BESYLATE 5 MG PO TABS
5.0000 mg | ORAL_TABLET | Freq: Every day | ORAL | Status: DC
  Filled 2012-05-26: qty 1

## 2012-05-26 MED ORDER — MORPHINE SULFATE 4 MG/ML IJ SOLN: 4.0000 mg | Freq: Once | INTRAMUSCULAR | Status: DC

## 2012-05-26 MED ORDER — MOMETASONE FURO-FORMOTEROL FUM 200-5 MCG/ACT IN AERO: 2.0000 | INHALATION_SPRAY | Freq: Three times a day (TID) | RESPIRATORY_TRACT | Status: DC | PRN

## 2012-05-26 MED ORDER — HYDROCODONE-ACETAMINOPHEN 5-325 MG PO TABS: 1.0000 | ORAL_TABLET | Freq: Four times a day (QID) | ORAL | Status: DC | PRN

## 2012-05-26 MED ORDER — MORPHINE SULFATE 4 MG/ML IJ SOLN
4.0000 mg | Freq: Once | INTRAMUSCULAR | Status: AC
  Administered 2012-05-26: 4 mg via INTRAVENOUS
  Filled 2012-05-26: qty 1

## 2012-05-26 MED ORDER — PAROXETINE HCL 10 MG PO TABS
10.0000 mg | ORAL_TABLET | Freq: Every day | ORAL | Status: DC
  Administered 2012-05-28 – 2012-05-31 (×4): 10 mg via ORAL
  Filled 2012-05-26 (×5): qty 1

## 2012-05-26 MED ORDER — MONTELUKAST SODIUM 10 MG PO TABS
10.0000 mg | ORAL_TABLET | Freq: Every day | ORAL | Status: DC
  Administered 2012-05-28 – 2012-05-31 (×4): 10 mg via ORAL
  Filled 2012-05-26 (×5): qty 1

## 2012-05-26 NOTE — H&P (Signed)
[facility] History and Physical  MAVEN ROSANDER GEX:[id number] DOB: [birth year] DOA: 05/26/2012  Referring physician: ED PCP: Danise Edge, MD  Specialists: Dr. Eulah Pont ortho  Chief Complaint: Fall, L Hip pain  HPI: [name] is a 79 y.o. female who presents after a fall from a standing position that occurred earlier today.  Patient was out shopping, stood up and started walking when she became dizzy, she did not pass out but did fall onto her left side.  She has had pain in her L hip since the fall movement makes it worse, rest makes it better.  Patient has known history of orthostatic hypotension and is not supposed to get up and start moving quickly like she did today per her family.  In the ED CT scan demonstrated an intertrochanteric fracture of the proximal left femur with comminution of the greater trochanter.  Hospitalist is admitting.  Review of Systems: 12 systems reviewed and negative.  Past Medical History  Diagnosis Date  . CAD (coronary artery disease)     a. 10/2007 PCI Diag w/ 2.25 x 8mm Taxus Atom DES.;  b. 09/2011 Lexi MV: EF 69%, no ischemia/infarct.  Marland Kitchen PVD (peripheral vascular disease)   . Carotid artery stenosis   . HTN (hypertension)   . Hyperlipidemia   . Hypothyroidism   . CKD (chronic kidney disease), stage III   . Diabetes mellitus 07/16/2011  . Allergic state 07/16/2011  . Insomnia 07/16/2011  . Recurrent UTI 07/16/2011  . Asthma     childhood, recurrent at 34  . Myelodysplastic syndrome 04/02/2011  . Sinusitis 07/18/2011  . Anemia 07/18/2011  . Arthritis 07/18/2011  . Cataract extraction status of right eye 08/18/2011  . Anxiety and depression 08/18/2011  . Bronchitis, acute 12/20/2011  . Headache 03/02/2012  . AV heart block     a. 10/2007 MDT Versa dual chamber PPM.  . Bladder cancer 12/2002   Past Surgical History  Procedure Date  . Cholecystectomy   . Pcm - medtronic   . Abdominal hysterectomy   . Shoulder open rotator cuff repair   . Foot surgery       bunionectomy   Social History:  reports that she has never smoked. She has never used smokeless tobacco. She reports that she does not drink alcohol or use illicit drugs.  Allergies  Allergen Reactions  . Codeine Anaphylaxis and Swelling  . Penicillins Anaphylaxis and Swelling  . Sulfonamide Derivatives Anaphylaxis and Swelling    Family History  Problem Relation Age of Onset  . Heart disease Mother     chf  . Arthritis Mother   . Arthritis Father   . Kidney disease Father   . Hypertension Father   . Cancer Brother     lung  . Arthritis Daughter     MVA with very serious injuries   Prior to Admission medications   Medication Sig Start Date End Date Taking? Authorizing Provider  acetaminophen (TYLENOL) 500 MG tablet Take 500 mg by mouth daily as needed. For pain   Yes Historical Provider, MD  amLODipine (NORVASC) 5 MG tablet Take 5 mg by mouth daily.   Yes Historical Provider, MD  aspirin 81 MG tablet Take 81 mg by mouth daily.     Yes Historical Provider, MD  cefdinir (OMNICEF) 300 MG capsule Take 1 capsule (300 mg total) by mouth 2 (two) times daily. 05/19/12  Yes Bradd Canary, MD  levothyroxine (SYNTHROID, LEVOTHROID) 112 MCG tablet Take 112 mcg by mouth daily.  Yes Historical Provider, MD  metFORMIN (GLUMETZA) 1000 MG (MOD) 24 hr tablet Take 1 tablet (1,000 mg total) by mouth 2 (two) times daily with a meal. 10/07/11  Yes Bradd Canary, MD  Mometasone Furo-Formoterol Fum (DULERA) 200-5 MCG/ACT AERO Inhale 2 puffs into the lungs 3 (three) times daily as needed. For shortness of breath   Yes Historical Provider, MD  montelukast (SINGULAIR) 10 MG tablet Take 10 mg by mouth daily.    Yes Historical Provider, MD  nitroGLYCERIN (NITROSTAT) 0.4 MG SL tablet Place 1 tablet (0.4 mg total) under the tongue every 5 (five) minutes x 3 doses as needed for chest pain. 03/29/12  Yes Ok Anis, NP  PARoxetine (PAXIL) 10 MG tablet Take 10 mg by mouth every morning.   Yes  Historical Provider, MD  rosuvastatin (CRESTOR) 20 MG tablet Take 20 mg by mouth at bedtime.    Yes Historical Provider, MD   Physical Exam: Filed Vitals:   05/26/12 1900 05/26/12 2015 05/26/12 2226 05/26/12 2242  BP: 102/59 105/54 146/37 100/42  Pulse: 71 73 46 73  Temp:   97.9 F (36.6 C) 99.4 F (37.4 C)  TempSrc:   Oral Oral  Resp: 14 13 18 18   SpO2: 96% 93% 91% 94%    General:  NAD, resting comfortably in bed Eyes: PEERLA EOMI ENT: mucous membranes moist Neck: supple w/o JVD Cardiovascular: RRR w/o MRG Respiratory: CTA B Abdomen: soft, nt, nd, bs+ Skin: no rash nor lesion Musculoskeletal: L hip pain Psychiatric: normal tone and affect Neurologic: AAOx3, grossly non-focal  Labs on Admission:  Basic Metabolic Panel:  Lab 05/26/12 9604  NA 141  K 4.8  CL 105  CO2 24  GLUCOSE 139*  BUN 20  CREATININE 1.14*  CALCIUM 8.9  MG --  PHOS --   Liver Function Tests: No results found for this basename: AST:5,ALT:5,ALKPHOS:5,BILITOT:5,PROT:5,ALBUMIN:5 in the last 168 hours No results found for this basename: LIPASE:5,AMYLASE:5 in the last 168 hours No results found for this basename: AMMONIA:5 in the last 168 hours CBC:  Lab 05/26/12 1704  WBC 9.8  NEUTROABS 8.1*  HGB 9.4*  HCT 29.2*  MCV 99.7  PLT 132*   Cardiac Enzymes: No results found for this basename: CKTOTAL:5,CKMB:5,CKMBINDEX:5,TROPONINI:5 in the last 168 hours  BNP (last 3 results)  Basename 04/20/12 1226  PROBNP 113.0*   CBG: No results found for this basename: GLUCAP:5 in the last 168 hours  Radiological Exams on Admission: Dg Chest 1 View  05/26/2012  *RADIOLOGY REPORT*  Clinical Data: Fall.  Cough and SOB.  CHEST - 1 VIEW  Comparison: 03/27/2012  Findings: Left chest wall pacer device is noted with lead in the right atrial appendage and right ventricle.  Normal heart size.  No pleural effusion identified.  Chronic interstitial lung disease is again noted and appears similar to previous exam.   No superimposed airspace consolidation noted.  IMPRESSION:  1.  No acute cardiopulmonary abnormalities. 2.  Interstitial lung disease.   Original Report Authenticated By: Signa Kell, M.D.    Dg Hip Complete Left  05/26/2012  *RADIOLOGY REPORT*  Clinical Data: Fall, left hip pain  LEFT HIP - COMPLETE 2+ VIEW  Comparison: None.  Findings: Hips are located.  No evidence of pelvic fracture  or sacral fracture.  Dedicated view of the left hip demonstrates a subtle cortical lucency/disruption at the greater trochanter.  IMPRESSION:   Cortical destruction of the of the greater trochanter. Differential includes degenerative change, osteomyelitis, or fracture.  Consider  CT of the pelvis if concern for fracture.  Findings discussed with Schuben PA on 05/26/2012 1735 hours   Original Report Authenticated By: Genevive Bi, M.D.    Dg Femur Left  05/26/2012  *RADIOLOGY REPORT*  Clinical Data: Fall, left leg pain  LEFT FEMUR - 2 VIEW  Comparison: .  None  Findings: No evidence of fracture of the distal left femur. Vascular calcifications noted.  IMPRESSION: No distal left femur fracture.   Original Report Authenticated By: Genevive Bi, M.D.    Ct Hip Left Wo Contrast  05/26/2012  *RADIOLOGY REPORT*  Clinical Data: Left hip pain secondary to a fall.  CT OF THE LEFT HIP WITHOUT CONTRAST  Technique:  Multidetector CT imaging was performed according to the standard protocol. Multiplanar CT image reconstructions were also generated.  Comparison: Radiographs dated 05/26/2012  Findings: There is a comminuted intertrochanteric fracture of the proximal left femur.  The superior aspect of the greater trochanter is comminuted.  Minimal osteophytes on the acetabulum.  IMPRESSION: Intertrochanteric fracture of the proximal left femur.  Comminution of the greater trochanter.   Original Report Authenticated By: Francene Boyers, M.D.     EKG: Independently reviewed.  Assessment/Plan Principal Problem:  *Closed left hip  fracture Active Problems:  HYPERLIPIDEMIA-MIXED  HYPERTENSION, UNSPECIFIED  HYPOTENSION, ORTHOSTATIC  Diabetes mellitus   1. L hip fracture - ortho consulted, on hip fracture pathway, currently NPO after midnight for anticipated possible surgery tomorrow.  Pain controlled with morphine at this time. 2. HTN - continue home meds, not hypertensive in ED 3. DM2 - holding metformin since patient NPO and putting her on med dose SSI q4h CBG checks 4. Orthostatic hypotension - likely the cause of the patients fall, well documented history of this, patient is strict bed rest anyhow due to issue #1. 5. Hyperlipidemia - continue statin therapy. 6. Hypothyroidism - continue synthroid  Code Status: Full Code (must indicate code status--if unknown or must be presumed, indicate so) Family Communication: Spoke with numerous family members at bedside (indicate person spoken with, if applicable, with phone number if by telephone) Disposition Plan: Admit to inpatient (indicate anticipated LOS)  Time spent: 70 min  Lillyauna Jenkinson M. [facility] Pager [phone]  If 7PM-7AM, please contact night-coverage [email or web address] Password [facility] 05/26/2012, 11:27 PM

## 2012-05-26 NOTE — ED Notes (Signed)
To ED via [facility] for eval past tripping and falling. Left upper leg/hip pain. 200 mcq Fentanyl IV given pta by ems.

## 2012-05-26 NOTE — ED Provider Notes (Signed)
History     CSN: [id number]  Arrival date & time 05/26/12  1541   None     Chief Complaint  Patient presents with  . Fall  . Hip Pain    (Consider location/radiation/quality/duration/timing/severity/associated sxs/prior treatment) HPI chief complaint: Left hip pain. Onset: Just prior to arrival. Location: Left hip. Worsened with movement. Quality: Full. History: Mild to moderate. Timing: Constant. Context: Patient has a history of orthostatic syncope and stood up abruptly today resulting in a fall. For signs and symptoms the review of systems. Regarding social history see nurse's notes. No history of alcohol use today. I have reviewed patient's past medical, past surgical, social history as well as medications and allergies.  Past Medical History  Diagnosis Date  . CAD (coronary artery disease)     a. 10/2007 PCI Diag w/ 2.25 x 8mm Taxus Atom DES.;  b. 09/2011 Lexi MV: EF 69%, no ischemia/infarct.  Marland Kitchen PVD (peripheral vascular disease)   . Carotid artery stenosis   . HTN (hypertension)   . Hyperlipidemia   . Hypothyroidism   . CKD (chronic kidney disease), stage III   . Diabetes mellitus 07/16/2011  . Allergic state 07/16/2011  . Insomnia 07/16/2011  . Recurrent UTI 07/16/2011  . Asthma     childhood, recurrent at 54  . Myelodysplastic syndrome 04/02/2011  . Sinusitis 07/18/2011  . Anemia 07/18/2011  . Arthritis 07/18/2011  . Cataract extraction status of right eye 08/18/2011  . Anxiety and depression 08/18/2011  . Bronchitis, acute 12/20/2011  . Headache 03/02/2012  . AV heart block     a. 10/2007 MDT Versa dual chamber PPM.  . Bladder cancer 12/2002    Past Surgical History  Procedure Date  . Cholecystectomy   . Pcm - medtronic   . Abdominal hysterectomy   . Shoulder open rotator cuff repair   . Foot surgery     bunionectomy    Family History  Problem Relation Age of Onset  . Heart disease Mother     chf  . Arthritis Mother   . Arthritis Father   . Kidney disease Father   .  Hypertension Father   . Cancer Brother     lung  . Arthritis Daughter     MVA with very serious injuries    History  Substance Use Topics  . Smoking status: Never Smoker   . Smokeless tobacco: Never Used  . Alcohol Use: No    OB History    Grav Para Term Preterm Abortions TAB SAB Ect Mult Living                  Review of Systems  Constitutional: Negative for fever and chills.  HENT: Negative for hearing loss, trouble swallowing, neck pain, neck stiffness and tinnitus.   Eyes: Negative for pain, discharge, itching and visual disturbance.  Respiratory: Negative for cough, chest tightness, shortness of breath, wheezing and stridor.   Cardiovascular: Negative for chest pain, palpitations and leg swelling.  Gastrointestinal: Negative for nausea, vomiting, abdominal pain, diarrhea, constipation and blood in stool.  Genitourinary: Negative for dysuria, urgency, frequency, hematuria, flank pain, decreased urine volume, difficulty urinating and pelvic pain.  Musculoskeletal: Negative for back pain and joint swelling.       Left hip pain.  Skin: Negative for rash and wound.  Neurological: Negative for dizziness, tremors, seizures, syncope, facial asymmetry, speech difficulty, weakness, light-headedness, numbness and headaches.  Hematological: Negative for adenopathy. Does not bruise/bleed easily.  Psychiatric/Behavioral: Negative for confusion and decreased  concentration.    Allergies  Codeine; Penicillins; and Sulfonamide derivatives  Home Medications   Current Outpatient Rx  Name  Route  Sig  Dispense  Refill  . ACETAMINOPHEN 500 MG PO TABS   Oral   Take 500 mg by mouth every 6 (six) hours as needed. For pain         . AMLODIPINE BESYLATE 2.5 MG PO TABS   Oral   Take 1 tablet (2.5 mg total) by mouth daily.   30 tablet   11   . ASPIRIN 81 MG PO TABS   Oral   Take 81 mg by mouth daily.           Marland Kitchen CEFDINIR 300 MG PO CAPS   Oral   Take 1 capsule (300 mg total) by  mouth 2 (two) times daily.   14 capsule   0   . ARANESP IJ   Injection   Inject as directed.         . FESOTERODINE FUMARATE ER 4 MG PO TB24   Oral   Take 2 tablets (8 mg total) by mouth daily.   30 tablet      . LEVOTHYROXINE SODIUM 100 MCG PO TABS   Oral   Take 1 tablet (100 mcg total) by mouth daily.   30 tablet   2   . LISINOPRIL 2.5 MG PO TABS      TAKE 1 TABLET BY MOUTH EVERY DAY   30 tablet   0     THANK YOU   . METFORMIN HCL ER (MOD) 1000 MG PO TB24   Oral   Take 1 tablet (1,000 mg total) by mouth 2 (two) times daily with a meal.   60 tablet   4   . MOMETASONE FURO-FORMOTEROL FUM 200-5 MCG/ACT IN AERO   Inhalation   Inhale 2 puffs into the lungs 3 (three) times daily as needed. For shortness of breath         . MONTELUKAST SODIUM 10 MG PO TABS   Oral   Take 10 mg by mouth as needed.          Marland Kitchen NITROGLYCERIN 0.4 MG SL SUBL   Sublingual   Place 1 tablet (0.4 mg total) under the tongue every 5 (five) minutes x 3 doses as needed for chest pain.   25 tablet   3   . PAROXETINE HCL 20 MG PO TABS   Oral   Take 1 tablet (20 mg total) by mouth every morning.   30 tablet   2   . ROSUVASTATIN CALCIUM 20 MG PO TABS   Oral   Take 20 mg by mouth daily.             There were no vitals taken for this visit.  Physical Exam  Constitutional: She is oriented to person, place, and time. She appears well-developed and well-nourished. No distress.  HENT:  Head: Normocephalic and atraumatic.  Eyes: Conjunctivae normal are normal. Right eye exhibits no discharge. Left eye exhibits no discharge. No scleral icterus.  Neck: Normal range of motion. Neck supple. No JVD present.  Cardiovascular: Normal rate, regular rhythm, normal heart sounds and intact distal pulses.   No murmur heard. Pulmonary/Chest: Effort normal and breath sounds normal. No respiratory distress. She has no wheezes. She has no rales. She exhibits no tenderness.  Abdominal: Soft. Bowel  sounds are normal. She exhibits no distension and no mass. There is no tenderness. There is no rebound and no  guarding.  Musculoskeletal: She exhibits tenderness. She exhibits no edema.       Right shoulder: Normal.       Left shoulder: Normal.       Right elbow: Normal.      Left elbow: Normal.       Right wrist: Normal.       Left wrist: Normal.       Right hip: Normal.       Left hip: She exhibits decreased range of motion, tenderness and bony tenderness. She exhibits no swelling, no crepitus and no deformity.       Right knee: Normal.       Right ankle: Normal.       Cervical back: Normal.       Thoracic back: Normal.       Lumbar back: Normal.       Right upper arm: Normal.       Left upper arm: Normal.       Right forearm: Normal.       Left forearm: Normal.       Right hand: Normal.       Left hand: Normal.       Right upper leg: Normal.       Left upper leg: She exhibits tenderness. She exhibits no bony tenderness, no swelling, no edema and no deformity.       Right lower leg: Normal.       Left lower leg: Normal.       Legs:      Right foot: Normal.       Left foot: Normal.  Neurological: She is alert and oriented to person, place, and time.  Skin: Skin is warm and dry. She is not diaphoretic.  Psychiatric: She has a normal mood and affect.    ED Course  Procedures (including critical care time)  Labs Reviewed  CBC WITH DIFFERENTIAL - Abnormal; Notable for the following:    RBC 2.93 (*)     Hemoglobin 9.4 (*)     HCT 29.2 (*)     RDW 16.1 (*)     Platelets 132 (*)     Neutrophils Relative 83 (*)     Neutro Abs 8.1 (*)     Lymphocytes Relative 8 (*)     All other components within normal limits  BASIC METABOLIC PANEL - Abnormal; Notable for the following:    Glucose, Bld 139 (*)     Creatinine, Ser 1.14 (*)     GFR calc non Af Amer 42 (*)     GFR calc Af Amer 49 (*)     All other components within normal limits  URINALYSIS, ROUTINE W REFLEX MICROSCOPIC -  Abnormal; Notable for the following:    APPearance HAZY (*)     Hgb urine dipstick LARGE (*)     Protein, ur 30 (*)     Leukocytes, UA MODERATE (*)     All other components within normal limits  URINE MICROSCOPIC-ADD ON - Abnormal; Notable for the following:    Squamous Epithelial / LPF FEW (*)     All other components within normal limits  POCT I-STAT TROPONIN I  CBC  BASIC METABOLIC PANEL   Dg Chest 1 View  05/26/2012  *RADIOLOGY REPORT*  Clinical Data: Fall.  Cough and SOB.  CHEST - 1 VIEW  Comparison: 03/27/2012  Findings: Left chest wall pacer device is noted with lead in the right atrial appendage and right ventricle.  Normal  heart size.  No pleural effusion identified.  Chronic interstitial lung disease is again noted and appears similar to previous exam.  No superimposed airspace consolidation noted.  IMPRESSION:  1.  No acute cardiopulmonary abnormalities. 2.  Interstitial lung disease.   Original Report Authenticated By: Signa Kell, M.D.    Dg Hip Complete Left  05/26/2012  *RADIOLOGY REPORT*  Clinical Data: Fall, left hip pain  LEFT HIP - COMPLETE 2+ VIEW  Comparison: None.  Findings: Hips are located.  No evidence of pelvic fracture  or sacral fracture.  Dedicated view of the left hip demonstrates a subtle cortical lucency/disruption at the greater trochanter.  IMPRESSION:   Cortical destruction of the of the greater trochanter. Differential includes degenerative change, osteomyelitis, or fracture.  Consider CT of the pelvis if concern for fracture.  Findings discussed with Schuben PA on 05/26/2012 1735 hours   Original Report Authenticated By: Genevive Bi, M.D.    Dg Femur Left  05/26/2012  *RADIOLOGY REPORT*  Clinical Data: Fall, left leg pain  LEFT FEMUR - 2 VIEW  Comparison: .  None  Findings: No evidence of fracture of the distal left femur. Vascular calcifications noted.  IMPRESSION: No distal left femur fracture.   Original Report Authenticated By: Genevive Bi, M.D.     Ct Hip Left Wo Contrast  05/26/2012  *RADIOLOGY REPORT*  Clinical Data: Left hip pain secondary to a fall.  CT OF THE LEFT HIP WITHOUT CONTRAST  Technique:  Multidetector CT imaging was performed according to the standard protocol. Multiplanar CT image reconstructions were also generated.  Comparison: Radiographs dated 05/26/2012  Findings: There is a comminuted intertrochanteric fracture of the proximal left femur.  The superior aspect of the greater trochanter is comminuted.  Minimal osteophytes on the acetabulum.  IMPRESSION: Intertrochanteric fracture of the proximal left femur.  Comminution of the greater trochanter.   Original Report Authenticated By: Francene Boyers, M.D.      1. Closed left hip fracture   2. Diabetes mellitus   3. Orthostatic hypotension   4. Unspecified essential hypertension   5. Unspecified hypothyroidism      EKG reviewed and interpreted: Sinus rhythm rate 83. Left axis deviation. Left bundle branch block consistent with prior EKGs. No ST segment abnormalities concerning for ischemia. MDM  Patient is a well-appearing 79 year old female with a history of AV block status post dual-chamber pacemaker as well as coronary artery disease with PCI in 2009 and reported history of long-standing orthostatic syncope when the patient stands up abruptly. Patient was having her nails done today when she states she stood up abruptly exactly like she was told not to and had a syncopal episode like she has many times before. No preceding chest pain, shortness of breath, palpitations, headache, dizziness. Only new symptom after a fall his left hip pain. Patient denies head injury. The patient is on aspirin. Patient appears well at this time. Left hip pain to palpation. Left lower extremity neurovascularly intact. Syncopal workup pending. Images pending. Low-dose morphine ordered. Pacemaker interrogation pending at this time.  Pacemaker interrogation unremarkable. Imaging by CT scan  consistent with a left intertrochanteric hip fracture. Labs unremarkable. Orthopedic surgery consulted. Admitted to the hospitalist.        Consuello Masse, MD 05/27/12 [phone]

## 2012-05-26 NOTE — ED Notes (Signed)
Patient transported to CT 

## 2012-05-26 NOTE — ED Provider Notes (Signed)
I saw and evaluated the patient, reviewed the resident's note and I agree with the findings and plan.  I reviewed and agree with resident ECG interpretation.    Patient reports that she has a history of orthostatic hypotension. Patient was at nail salon, likely stood up too quickly and fell forward here she is acting appropriately with no complaints of chest pain, shortness of breath. She has injury to her left hip with episodes of muscular spasms and significant tenderness. Patient received IV fentanyl by EMS prior to arrival. I suspect the patient has a left hip fracture. Her prior orthopedist is  Dr. Eulah Pont. Plan is to keep on monitor her, obtain ECG and troponin. I suspect the patient's fall is either do to mechanical fall or orthostasis as she has had in the past. If indeed the patient has a left hip fracture, will contact both orthopedics and [facility] for admission.  Gavin Pound. Jameer Storie, MD 05/27/12 1731

## 2012-05-26 NOTE — ED Notes (Signed)
Pt to ED via EMS.  Per daughter, pt with hx of orthostatic hypotension and has fallen before after standing too quickly.  Per pt, she stood up to fast and fell, landing directly on L hip.  Pt appears in great pain.  Hips bound by sheet by EMS.  CMS to L foot intact, though pt states she DOES feel some tingling to L foot.

## 2012-05-27 ENCOUNTER — Encounter (HOSPITAL_COMMUNITY): Payer: Self-pay | Admitting: Anesthesiology

## 2012-05-27 ENCOUNTER — Encounter (HOSPITAL_COMMUNITY): Payer: Self-pay | Admitting: Orthopedic Surgery

## 2012-05-27 ENCOUNTER — Inpatient Hospital Stay (HOSPITAL_COMMUNITY): Payer: Medicare Other

## 2012-05-27 ENCOUNTER — Inpatient Hospital Stay (HOSPITAL_COMMUNITY): Payer: Medicare Other | Admitting: Anesthesiology

## 2012-05-27 ENCOUNTER — Encounter (HOSPITAL_COMMUNITY)
Admission: EM | Disposition: A | Discharge status: Rehabilitation Facility | Payer: Self-pay | Source: Home / Self Care | Attending: Family Medicine

## 2012-05-27 DIAGNOSIS — S72142A Displaced intertrochanteric fracture of left femur, initial encounter for closed fracture: Secondary | ICD-10-CM

## 2012-05-27 HISTORY — DX: Displaced intertrochanteric fracture of left femur, initial encounter for closed fracture: S72.142A

## 2012-05-27 HISTORY — PX: INTRAMEDULLARY (IM) NAIL INTERTROCHANTERIC: SHX5875

## 2012-05-27 LAB — CBC
HCT: 26.3 % — ABNORMAL LOW (ref 36.0–46.0)
HCT: 26.9 % — ABNORMAL LOW (ref 36.0–46.0)
Hemoglobin: 8.2 g/dL — ABNORMAL LOW (ref 12.0–15.0)
Hemoglobin: 8.6 g/dL — ABNORMAL LOW (ref 12.0–15.0)
MCH: 31.2 pg (ref 26.0–34.0)
MCHC: 32 g/dL (ref 30.0–36.0)
MCV: 97.5 fL (ref 78.0–100.0)
RDW: 16.2 % — ABNORMAL HIGH (ref 11.5–15.5)
RDW: 16.8 % — ABNORMAL HIGH (ref 11.5–15.5)
WBC: 8.7 10*3/uL (ref 4.0–10.5)

## 2012-05-27 LAB — GLUCOSE, CAPILLARY
Glucose-Capillary: 109 mg/dL — ABNORMAL HIGH (ref 70–99)
Glucose-Capillary: 111 mg/dL — ABNORMAL HIGH (ref 70–99)
Glucose-Capillary: 99 mg/dL (ref 70–99)
Glucose-Capillary: 145 mg/dL — ABNORMAL HIGH (ref 70–99)
Glucose-Capillary: 157 mg/dL — ABNORMAL HIGH (ref 70–99)

## 2012-05-27 LAB — BASIC METABOLIC PANEL
BUN: 17 mg/dL (ref 6–23)
CO2: 24 mEq/L (ref 19–32)
Chloride: 103 mEq/L (ref 96–112)
GFR calc Af Amer: 41 mL/min — ABNORMAL LOW (ref >90)
Glucose, Bld: 106 mg/dL — ABNORMAL HIGH (ref 70–99)
Potassium: 5.1 mEq/L (ref 3.5–5.1)

## 2012-05-27 LAB — PROTIME-INR: INR: 1.14 (ref 0.00–1.49)

## 2012-05-27 SURGERY — FIXATION, FRACTURE, INTERTROCHANTERIC, WITH INTRAMEDULLARY ROD
Anesthesia: General | Site: Hip | Laterality: Left | Wound class: Clean

## 2012-05-27 MED ORDER — ACETAMINOPHEN 325 MG PO TABS
650.0000 mg | ORAL_TABLET | Freq: Four times a day (QID) | ORAL | Status: DC | PRN
  Administered 2012-05-29 (×2): 650 mg via ORAL
  Filled 2012-05-27 (×2): qty 2

## 2012-05-27 MED ORDER — DEXAMETHASONE SODIUM PHOSPHATE 4 MG/ML IJ SOLN
INTRAMUSCULAR | Status: DC | PRN
  Administered 2012-05-27: 4 mg via INTRAVENOUS

## 2012-05-27 MED ORDER — DOCUSATE SODIUM 100 MG PO CAPS
100.0000 mg | ORAL_CAPSULE | Freq: Two times a day (BID) | ORAL | Status: DC
  Administered 2012-05-27 – 2012-05-31 (×8): 100 mg via ORAL
  Filled 2012-05-27 (×9): qty 1

## 2012-05-27 MED ORDER — SODIUM CHLORIDE 0.9 % IV SOLN
INTRAVENOUS | Status: DC | PRN
  Administered 2012-05-27: 17:00:00 via INTRAVENOUS

## 2012-05-27 MED ORDER — HYDROCODONE-ACETAMINOPHEN 5-325 MG PO TABS: 1.0000 | ORAL_TABLET | Freq: Four times a day (QID) | ORAL | Status: DC | PRN

## 2012-05-27 MED ORDER — MEPERIDINE HCL 25 MG/ML IJ SOLN: 6.2500 mg | INTRAMUSCULAR | Status: DC | PRN

## 2012-05-27 MED ORDER — PHENOL 1.4 % MT LIQD: 1.0000 | OROMUCOSAL | Status: DC | PRN

## 2012-05-27 MED ORDER — VANCOMYCIN HCL IN DEXTROSE 1-5 GM/200ML-% IV SOLN
1000.0000 mg | Freq: Two times a day (BID) | INTRAVENOUS | Status: AC
  Administered 2012-05-27: 1000 mg via INTRAVENOUS
  Filled 2012-05-27 (×2): qty 200

## 2012-05-27 MED ORDER — METOCLOPRAMIDE HCL 5 MG/ML IJ SOLN
INTRAMUSCULAR | Status: DC | PRN
  Administered 2012-05-27: 5 mg via INTRAVENOUS

## 2012-05-27 MED ORDER — LIDOCAINE HCL (CARDIAC) 20 MG/ML IV SOLN
INTRAVENOUS | Status: DC | PRN
  Administered 2012-05-27: 25 mg via INTRAVENOUS

## 2012-05-27 MED ORDER — ALUM & MAG HYDROXIDE-SIMETH 200-200-20 MG/5ML PO SUSP: 30.0000 mL | ORAL | Status: DC | PRN

## 2012-05-27 MED ORDER — ENOXAPARIN SODIUM 30 MG/0.3ML ~~LOC~~ SOLN
30.0000 mg | SUBCUTANEOUS | Status: DC
  Administered 2012-05-28 – 2012-05-30 (×3): 30 mg via SUBCUTANEOUS
  Filled 2012-05-27 (×3): qty 0.3

## 2012-05-27 MED ORDER — SENNA-DOCUSATE SODIUM 8.6-50 MG PO TABS: 1.0000 | ORAL_TABLET | Freq: Every day | ORAL | Status: DC

## 2012-05-27 MED ORDER — ONDANSETRON HCL 4 MG/2ML IJ SOLN
INTRAMUSCULAR | Status: DC | PRN
  Administered 2012-05-27: 4 mg via INTRAVENOUS

## 2012-05-27 MED ORDER — ONDANSETRON HCL 4 MG PO TABS: 4.0000 mg | ORAL_TABLET | Freq: Four times a day (QID) | ORAL | Status: DC | PRN

## 2012-05-27 MED ORDER — WARFARIN SODIUM 2.5 MG PO TABS
2.5000 mg | ORAL_TABLET | Freq: Once | ORAL | Status: AC
  Administered 2012-05-27: 2.5 mg via ORAL
  Filled 2012-05-27: qty 1

## 2012-05-27 MED ORDER — NEOSTIGMINE METHYLSULFATE 1 MG/ML IJ SOLN
INTRAMUSCULAR | Status: DC | PRN
  Administered 2012-05-27: 5 mg via INTRAVENOUS

## 2012-05-27 MED ORDER — METOCLOPRAMIDE HCL 10 MG PO TABS: 5.0000 mg | ORAL_TABLET | Freq: Three times a day (TID) | ORAL | Status: DC | PRN

## 2012-05-27 MED ORDER — PROMETHAZINE HCL 25 MG/ML IJ SOLN: 6.2500 mg | INTRAMUSCULAR | Status: DC | PRN

## 2012-05-27 MED ORDER — SENNA 8.6 MG PO TABS
1.0000 | ORAL_TABLET | Freq: Two times a day (BID) | ORAL | Status: DC
Start: 2012-05-27 — End: 2012-05-31
  Administered 2012-05-27 – 2012-05-31 (×8): 8.6 mg via ORAL
  Filled 2012-05-27 (×10): qty 1

## 2012-05-27 MED ORDER — SODIUM CHLORIDE 0.9 % IV SOLN
10.0000 mg | INTRAVENOUS | Status: DC | PRN
  Administered 2012-05-27: 15 ug/min via INTRAVENOUS

## 2012-05-27 MED ORDER — POLYETHYLENE GLYCOL 3350 17 G PO PACK
17.0000 g | PACK | Freq: Every day | ORAL | Status: DC | PRN
  Administered 2012-05-30: 17 g via ORAL

## 2012-05-27 MED ORDER — PROPOFOL 10 MG/ML IV BOLUS
INTRAVENOUS | Status: DC | PRN
  Administered 2012-05-27: 80 mg via INTRAVENOUS

## 2012-05-27 MED ORDER — WARFARIN - PHARMACIST DOSING INPATIENT: Freq: Every day | Status: DC

## 2012-05-27 MED ORDER — ENOXAPARIN SODIUM 40 MG/0.4ML ~~LOC~~ SOLN
40.0000 mg | SUBCUTANEOUS | Status: DC
  Filled 2012-05-27: qty 0.4

## 2012-05-27 MED ORDER — BUPIVACAINE HCL 0.25 % IJ SOLN
INTRAMUSCULAR | Status: DC | PRN
  Administered 2012-05-27: 20 mL

## 2012-05-27 MED ORDER — MIDAZOLAM HCL 2 MG/2ML IJ SOLN
0.5000 mg | Freq: Once | INTRAMUSCULAR | Status: AC | PRN
Start: 2012-05-27 — End: 2012-05-27

## 2012-05-27 MED ORDER — METOCLOPRAMIDE HCL 5 MG/ML IJ SOLN: 5.0000 mg | Freq: Three times a day (TID) | INTRAMUSCULAR | Status: DC | PRN

## 2012-05-27 MED ORDER — MORPHINE SULFATE 2 MG/ML IJ SOLN
0.5000 mg | INTRAMUSCULAR | Status: DC | PRN
  Administered 2012-05-28: 0.5 mg via INTRAVENOUS
  Filled 2012-05-27: qty 1

## 2012-05-27 MED ORDER — LACTATED RINGERS IV SOLN: INTRAVENOUS | Status: DC | PRN

## 2012-05-27 MED ORDER — FENTANYL CITRATE 0.05 MG/ML IJ SOLN
INTRAMUSCULAR | Status: DC | PRN
  Administered 2012-05-27 (×2): 50 ug via INTRAVENOUS
  Administered 2012-05-27: 100 ug via INTRAVENOUS

## 2012-05-27 MED ORDER — VANCOMYCIN HCL IN DEXTROSE 1-5 GM/200ML-% IV SOLN
1000.0000 mg | Freq: Once | INTRAVENOUS | Status: DC
  Filled 2012-05-27: qty 200

## 2012-05-27 MED ORDER — OXYCODONE HCL 5 MG/5ML PO SOLN: 5.0000 mg | Freq: Once | ORAL | Status: AC | PRN

## 2012-05-27 MED ORDER — ARTIFICIAL TEARS OP OINT
TOPICAL_OINTMENT | OPHTHALMIC | Status: DC | PRN
  Administered 2012-05-27: 1 via OPHTHALMIC

## 2012-05-27 MED ORDER — MORPHINE SULFATE 15 MG PO TABS
7.5000 mg | ORAL_TABLET | Freq: Four times a day (QID) | ORAL | Status: DC | PRN
  Administered 2012-05-28 – 2012-05-31 (×6): 7.5 mg via ORAL
  Filled 2012-05-27 (×6): qty 1

## 2012-05-27 MED ORDER — ONDANSETRON HCL 4 MG/2ML IJ SOLN: 4.0000 mg | Freq: Four times a day (QID) | INTRAMUSCULAR | Status: DC | PRN

## 2012-05-27 MED ORDER — MENTHOL 3 MG MT LOZG: 1.0000 | LOZENGE | OROMUCOSAL | Status: DC | PRN

## 2012-05-27 MED ORDER — HYDROMORPHONE HCL PF 1 MG/ML IJ SOLN
INTRAMUSCULAR | Status: AC
  Filled 2012-05-27: qty 1

## 2012-05-27 MED ORDER — SODIUM CHLORIDE 0.9 % IV SOLN
INTRAVENOUS | Status: DC | PRN
  Administered 2012-05-27: 16:00:00 via INTRAVENOUS

## 2012-05-27 MED ORDER — FENTANYL CITRATE 0.05 MG/ML IJ SOLN
INTRAMUSCULAR | Status: AC
  Filled 2012-05-27: qty 2

## 2012-05-27 MED ORDER — ONDANSETRON HCL 4 MG/2ML IJ SOLN
4.0000 mg | Freq: Four times a day (QID) | INTRAMUSCULAR | Status: DC | PRN
Start: 2012-05-27 — End: 2012-05-27

## 2012-05-27 MED ORDER — SORBITOL 70 % SOLN: 30.0000 mL | Freq: Every day | Status: DC | PRN

## 2012-05-27 MED ORDER — OXYCODONE HCL 5 MG PO TABS: 5.0000 mg | ORAL_TABLET | Freq: Once | ORAL | Status: AC | PRN

## 2012-05-27 MED ORDER — ROCURONIUM BROMIDE 100 MG/10ML IV SOLN
INTRAVENOUS | Status: DC | PRN
  Administered 2012-05-27: 35 mg via INTRAVENOUS

## 2012-05-27 MED ORDER — HYDROCODONE-ACETAMINOPHEN 5-325 MG PO TABS
1.0000 | ORAL_TABLET | Freq: Four times a day (QID) | ORAL | Status: DC | PRN
  Filled 2012-05-27 (×2): qty 2

## 2012-05-27 MED ORDER — WARFARIN SODIUM 5 MG PO TABS: 5.0000 mg | ORAL_TABLET | Freq: Every day | ORAL | Status: DC

## 2012-05-27 MED ORDER — MOMETASONE FURO-FORMOTEROL FUM 200-5 MCG/ACT IN AERO
2.0000 | INHALATION_SPRAY | Freq: Two times a day (BID) | RESPIRATORY_TRACT | Status: DC
  Administered 2012-05-27 – 2012-05-31 (×9): 2 via RESPIRATORY_TRACT
  Filled 2012-05-27: qty 8.8

## 2012-05-27 MED ORDER — ACETAMINOPHEN 650 MG RE SUPP: 650.0000 mg | Freq: Four times a day (QID) | RECTAL | Status: DC | PRN

## 2012-05-27 MED ORDER — FENTANYL CITRATE 0.05 MG/ML IJ SOLN
25.0000 ug | INTRAMUSCULAR | Status: DC | PRN
  Administered 2012-05-27: 25 ug via INTRAVENOUS

## 2012-05-27 MED ORDER — POTASSIUM CHLORIDE IN NACL 20-0.45 MEQ/L-% IV SOLN
INTRAVENOUS | Status: DC
  Administered 2012-05-27: 75 mL/h via INTRAVENOUS
  Filled 2012-05-27 (×3): qty 1000

## 2012-05-27 MED ORDER — 0.9 % SODIUM CHLORIDE (POUR BTL) OPTIME
TOPICAL | Status: DC | PRN
  Administered 2012-05-27: 1000 mL

## 2012-05-27 MED ORDER — GLYCOPYRROLATE 0.2 MG/ML IJ SOLN
INTRAMUSCULAR | Status: DC | PRN
  Administered 2012-05-27: 0.6 mg via INTRAVENOUS

## 2012-05-27 SURGICAL SUPPLY — 50 items
APL SKNCLS STERI-STRIP NONHPOA (GAUZE/BANDAGES/DRESSINGS) ×1
BENZOIN TINCTURE PRP APPL 2/3 (GAUZE/BANDAGES/DRESSINGS) ×2 IMPLANT
BLADE HELICAL 90 (Orthopedic Implant) ×1 IMPLANT
BNDG COHESIVE 4X5 TAN STRL (GAUZE/BANDAGES/DRESSINGS) ×1 IMPLANT
BOOTCOVER CLEANROOM LRG (PROTECTIVE WEAR) ×4 IMPLANT
CLOTH BEACON ORANGE TIMEOUT ST (SAFETY) ×2 IMPLANT
COVER SURGICAL LIGHT HANDLE (MISCELLANEOUS) ×2 IMPLANT
DRAPE STERI IOBAN 125X83 (DRAPES) ×2 IMPLANT
DRSG MEPILEX BORDER 4X4 (GAUZE/BANDAGES/DRESSINGS) ×3 IMPLANT
DRSG MEPILEX BORDER 4X8 (GAUZE/BANDAGES/DRESSINGS) ×2 IMPLANT
DRSG PAD ABDOMINAL 8X10 ST (GAUZE/BANDAGES/DRESSINGS) ×1 IMPLANT
DURAPREP 26ML APPLICATOR (WOUND CARE) ×2 IMPLANT
ELECT CAUTERY BLADE 6.4 (BLADE) ×2 IMPLANT
ELECT REM PT RETURN 9FT ADLT (ELECTROSURGICAL) ×2
ELECTRODE REM PT RTRN 9FT ADLT (ELECTROSURGICAL) ×1 IMPLANT
EVACUATOR 1/8 PVC DRAIN (DRAIN) IMPLANT
FACESHIELD LNG OPTICON STERILE (SAFETY) ×2 IMPLANT
GAUZE XEROFORM 5X9 LF (GAUZE/BANDAGES/DRESSINGS) ×1 IMPLANT
GLOVE BIO SURGEON STRL SZ 6.5 (GLOVE) ×1 IMPLANT
GLOVE BIOGEL PI IND STRL 7.0 (GLOVE) IMPLANT
GLOVE BIOGEL PI IND STRL 8 (GLOVE) ×1 IMPLANT
GLOVE BIOGEL PI INDICATOR 7.0 (GLOVE) ×1
GLOVE BIOGEL PI INDICATOR 8 (GLOVE) ×2
GLOVE NEODERM STER SZ 7 (GLOVE) ×1 IMPLANT
GLOVE ORTHO TXT STRL SZ7.5 (GLOVE) ×3 IMPLANT
GLOVE SURG ORTHO 8.0 STRL STRW (GLOVE) ×3 IMPLANT
GLOVE SURG SS PI 7.5 STRL IVOR (GLOVE) ×1 IMPLANT
GOWN STRL NON-REIN LRG LVL3 (GOWN DISPOSABLE) ×3 IMPLANT
GOWN STRL REIN 2XL LVL4 (GOWN DISPOSABLE) ×1 IMPLANT
GUIDEWIRE 3.2X400 (WIRE) ×1 IMPLANT
KIT ROOM TURNOVER OR (KITS) ×2 IMPLANT
MANIFOLD NEPTUNE II (INSTRUMENTS) ×2 IMPLANT
NAIL FIXATION (Nail) ×1 IMPLANT
NDL HYPO 25GX1X1/2 BEV (NEEDLE) IMPLANT
NEEDLE HYPO 25GX1X1/2 BEV (NEEDLE) ×2 IMPLANT
NS IRRIG 1000ML POUR BTL (IV SOLUTION) ×2 IMPLANT
PACK GENERAL/GYN (CUSTOM PROCEDURE TRAY) ×2 IMPLANT
PAD ARMBOARD 7.5X6 YLW CONV (MISCELLANEOUS) ×4 IMPLANT
STAPLER VISISTAT 35W (STAPLE) ×2 IMPLANT
STRIP CLOSURE SKIN 1/2X4 (GAUZE/BANDAGES/DRESSINGS) ×2 IMPLANT
SUT MNCRL AB 4-0 PS2 18 (SUTURE) ×1 IMPLANT
SUT VIC AB 0 CTB1 27 (SUTURE) ×2 IMPLANT
SUT VIC AB 2-0 FS1 27 (SUTURE) ×1 IMPLANT
SUT VIC AB 2-0 SH 27 (SUTURE)
SUT VIC AB 2-0 SH 27XBRD (SUTURE) IMPLANT
SUT VIC AB 3-0 SH 8-18 (SUTURE) ×2 IMPLANT
SYR CONTROL 10ML LL (SYRINGE) ×1 IMPLANT
TOWEL OR 17X24 6PK STRL BLUE (TOWEL DISPOSABLE) ×2 IMPLANT
TOWEL OR 17X26 10 PK STRL BLUE (TOWEL DISPOSABLE) ×2 IMPLANT
WATER STERILE IRR 1000ML POUR (IV SOLUTION) ×1 IMPLANT

## 2012-05-27 NOTE — Anesthesia Procedure Notes (Signed)
Procedure Name: Intubation Date/Time: 05/27/2012 4:23 PM Performed by: Wray Kearns A Pre-anesthesia Checklist: Patient identified, Timeout performed, Emergency Drugs available, Suction available and Patient being monitored Patient Re-evaluated:Patient Re-evaluated prior to inductionOxygen Delivery Method: Circle system utilized Preoxygenation: Pre-oxygenation with 100% oxygen Intubation Type: IV induction and Cricoid Pressure applied Ventilation: Mask ventilation without difficulty Laryngoscope Size: Mac and 3 Grade View: Grade I Tube type: Oral Tube size: 7.0 mm Number of attempts: 1 Airway Equipment and Method: Stylet Placement Confirmation: ETT inserted through vocal cords under direct vision,  positive ETCO2 and breath sounds checked- equal and bilateral Secured at: 21 cm Tube secured with: Tape Dental Injury: Teeth and Oropharynx as per pre-operative assessment

## 2012-05-27 NOTE — Anesthesia Postprocedure Evaluation (Signed)
  Anesthesia Post-op Note  Patient: [name]  Procedure(s) Performed: Procedure(s) (LRB) with comments: INTRAMEDULLARY (IM) NAIL INTERTROCHANTRIC (Left)  Patient Location: PACU  Anesthesia Type:General  Level of Consciousness: awake, alert , oriented and patient cooperative  Airway and Oxygen Therapy: Patient Spontanous Breathing  Post-op Pain: none  Post-op Assessment: Post-op Vital signs reviewed, Patient's Cardiovascular Status Stable, Respiratory Function Stable, Patent Airway, No signs of Nausea or vomiting and Pain level controlled  Post-op Vital Signs: Reviewed and stable  Complications: No apparent anesthesia complications

## 2012-05-27 NOTE — Op Note (Signed)
DATE OF SURGERY:  05/27/2012  TIME: 5:25 PM  PATIENT NAME:  [name]  AGE: 79 y.o.  PRE-OPERATIVE DIAGNOSIS:  Left intertrochanteric hip fracture  POST-OPERATIVE DIAGNOSIS:  SAME  PROCEDURE:  INTRAMEDULLARY (IM) NAIL INTERTROCHANTRIC  SURGEON:  Jaleen Grupp P  ASSISTANT:  Janace Litten, OPA-C, present and scrubbed throughout the case, critical for assistance with exposure, retraction, instrumentation, and closure.  OPERATIVE IMPLANTS: Synthes trochanteric femoral nail with interlocking helical blade into the femoral head.  PREOPERATIVE INDICATIONS:  [name] is a 79 y.o. year old who fell and suffered a hip fracture. She was brought into the ER and then admitted and optimized and then elected for surgical intervention.    The risks benefits and alternatives were discussed with the patient including but not limited to the risks of nonoperative treatment, versus surgical intervention including infection, bleeding, nerve injury, malunion, nonunion, hardware prominence, hardware failure, need for hardware removal, blood clots, cardiopulmonary complications, morbidity, mortality, among others, and they were willing to proceed.    OPERATIVE PROCEDURE:  The patient was brought to the operating room and placed in the supine position. General anesthesia was administered, with a foley. She was placed on the fracture table.  Closed reduction was performed under C-arm guidance. The length of the femur was also measured using fluoroscopy. Time out was then performed after sterile prep and drape. She received preoperative antibiotics.  Incision was made proximal to the greater trochanter. A guidewire was placed in the appropriate position. Confirmation was made on AP and lateral views. The above-named nail was opened. I opened the proximal femur with a reamer. I then placed the nail by hand easily down. I did not need to ream the femur.  Once the nail was completely seated, I placed a  guidepin into the femoral head into the center center position. I measured the length, and then reamed the lateral cortex and up into the head. I then placed the helical blade. Slight compression was applied. Anatomic fixation achieved. Bone quality was mediocre.  I then secured the proximal interlocking bolt, and took off a half a turn, and then removed the instruments, and took final C-arm pictures AP and lateral the entire length of the leg. Anatomic reconstruction was achieved, and the wounds were irrigated copiously and closed with Vicryl followed by Steri-Strips and sterile gauze for the skin. The patient was awakened and returned to PACU in stable and satisfactory condition. There no complications and the patient tolerated the procedure well.  She will be weightbearing as tolerated, and will be on Lovenox bridging to Coumadin for a period of one month with a goal INR of 2-3.   Teryl Lucy, M.D.

## 2012-05-27 NOTE — Anesthesia Preprocedure Evaluation (Addendum)
Anesthesia Evaluation  Patient identified by MRN, date of birth, ID band Patient awake    Reviewed: Allergy & Precautions, H&P , NPO status , Patient's Chart, lab work & pertinent test results  History of Anesthesia Complications Negative for: history of anesthetic complications  Airway Mallampati: II TM Distance: >3 FB Neck ROM: Full    Dental  (+) Partial Lower, Partial Upper and Dental Advisory Given   Pulmonary asthma , Recent URI , Residual Cough,  breath sounds clear to auscultation  Pulmonary exam normal       Cardiovascular hypertension, Pt. on medications + CAD, + Cardiac Stents and + Peripheral Vascular Disease + dysrhythmias (treated and paced) Supra Ventricular Tachycardia and Ventricular Tachycardia + pacemaker (placed for bradycardia, high degree block, DDD) Rhythm:Regular Rate:Normal  6/13 stress test: no ischemia, normal LVF, EF 69%   Neuro/Psych negative neurological ROS     GI/Hepatic negative GI ROS, Neg liver ROS,   Endo/Other  diabetes (gluc 111), Well Controlled, Type 2, Oral Hypoglycemic AgentsHypothyroidism   Renal/GU Renal InsufficiencyRenal disease (Crt 1.32)     Musculoskeletal   Abdominal   Peds  Hematology  (+) Blood dyscrasia, anemia ,   Anesthesia Other Findings   Reproductive/Obstetrics                          Anesthesia Physical Anesthesia Plan  ASA: III  Anesthesia Plan: General   Post-op Pain Management:    Induction: Intravenous  Airway Management Planned: Oral ETT  Additional Equipment:   Intra-op Plan:   Post-operative Plan: Extubation in OR  Informed Consent: I have reviewed the patients History and Physical, chart, labs and discussed the procedure including the risks, benefits and alternatives for the proposed anesthesia with the patient or authorized representative who has indicated his/her understanding and acceptance.   Dental advisory  given  Plan Discussed with: CRNA and Surgeon  Anesthesia Plan Comments: (Plan routine monitors, GETA)        Anesthesia Quick Evaluation

## 2012-05-27 NOTE — Transfer of Care (Signed)
Immediate Anesthesia Transfer of Care Note  Patient: [name]  Procedure(s) Performed: Procedure(s) (LRB) with comments: INTRAMEDULLARY (IM) NAIL INTERTROCHANTRIC (Left)  Patient Location: PACU  Anesthesia Type:General  Level of Consciousness: sedated, patient cooperative and responds to stimulation  Airway & Oxygen Therapy: Patient Spontanous Breathing and Patient connected to nasal cannula oxygen  Post-op Assessment: Report given to PACU RN, Post -op Vital signs reviewed and stable, Patient moving all extremities and Patient moving all extremities X 4  Post vital signs: Reviewed and stable  Complications: No apparent anesthesia complications

## 2012-05-27 NOTE — Progress Notes (Signed)
INITIAL NUTRITION ASSESSMENT  DOCUMENTATION CODES Per approved criteria  -Not Applicable   INTERVENTION: 1.  Modify diet; diet advancement per MD discretion to Regular goal.  Supplements to be considered based on adequacy of intake.  NUTRITION DIAGNOSIS: Inadequate oral intake related to omission of energy dense foods as evidenced by pt NPO for surgery.   Monitor:  1.  Food/Beverage; resume of PO diet with tolerance.  Pt meeting >/=90% estimated needs 2.  Wt/wt change; monitor trends.  Encourage gain   Reason for Assessment: consult; hip fx protocol  79 y.o. female  Admitting Dx: Closed intertrochanteric fracture of left hip  ASSESSMENT: Pt admitted with hip pain s/p fall.  Pt with known h/o orthostatic hypotension possibly contributing to fall.   Pt being taken to OR at time of visit.   Per chart, review pt typically weighs 110-115 lbs, however over the past year has lost ~10 lbs.  Pt has lost 5 of those lbs in the past 3 months (4.5% in 3 months).  Although not clinically significant, this wt change is concerning. RD to monitor for resume of PO diet and pt meeting estimated needs.  Lab Results  Component Value Date   HGBA1C 6.5 04/26/2012    Height: Ht Readings from Last 1 Encounters:  05/26/12 5' (1.524 m)    Weight: Wt Readings from Last 1 Encounters:  05/26/12 104 lb 11.5 oz (47.5 kg)    Ideal Body Weight: 100 lbs  % Ideal Body Weight: 104%  Wt Readings from Last 10 Encounters:  05/26/12 104 lb 11.5 oz (47.5 kg)  05/26/12 104 lb 11.5 oz (47.5 kg)  05/15/12 104 lb 11.2 oz (47.492 kg)  04/26/12 105 lb 12.8 oz (47.991 kg)  04/20/12 105 lb (47.628 kg)  03/27/12 112 lb 10.5 oz (51.1 kg)  02/29/12 108 lb (48.988 kg)  02/08/12 109 lb 12.8 oz (49.805 kg)  01/25/12 109 lb 4 oz (49.555 kg)  12/17/11 110 lb 12.8 oz (50.259 kg)    Usual Body Weight: 110-115 lbs prior to 2013  % Usual Body Weight: 90-95%  BMI:  Body mass index is 20.45 kg/(m^2).  Estimated  Nutritional Needs: Kcal: 1320-1450 Protein: 50-63g Fluid: ~1.5 L/day  Skin: intact, edema to LLE  Diet Order: NPO  EDUCATION NEEDS: -Education not appropriate at this time   Intake/Output Summary (Last 24 hours) at 05/27/12 1044 Last data filed at 05/27/12 0500  Gross per 24 hour  Intake      0 ml  Output    300 ml  Net   -300 ml    Last BM: 1/9  Labs:   Lab 05/27/12 0705 05/26/12 1704  NA 137 141  K 5.1 4.8  CL 103 105  CO2 24 24  BUN 17 20  CREATININE 1.32* 1.14*  CALCIUM 8.6 8.9  MG -- --  PHOS -- --  GLUCOSE 106* 139*    CBG (last 3)   Basename 05/27/12 0824 05/27/12 0440  GLUCAP 111* 109*    Scheduled Meds:   . amLODipine  5 mg Oral Daily  . aspirin  81 mg Oral Daily  . atorvastatin  40 mg Oral q1800  . ciprofloxacin  400 mg Intravenous Q24H  . insulin aspart  0-15 Units Subcutaneous Q4H  . levothyroxine  112 mcg Oral QAC breakfast  . mometasone-formoterol  2 puff Inhalation BID  . montelukast  10 mg Oral Daily  .  morphine injection  4 mg Intravenous Once  . PARoxetine  10 mg Oral  Daily  . vancomycin  1,000 mg Intravenous Once    Continuous Infusions:   . sodium chloride 75 mL/hr at 05/27/12 0945    Past Medical History  Diagnosis Date  . CAD (coronary artery disease)     a. 10/2007 PCI Diag w/ 2.25 x 8mm Taxus Atom DES.;  b. 09/2011 Lexi MV: EF 69%, no ischemia/infarct.  Marland Kitchen PVD (peripheral vascular disease)   . Carotid artery stenosis   . HTN (hypertension)   . Hyperlipidemia   . Hypothyroidism   . CKD (chronic kidney disease), stage III   . Diabetes mellitus 07/16/2011  . Allergic state 07/16/2011  . Insomnia 07/16/2011  . Recurrent UTI 07/16/2011  . Asthma     childhood, recurrent at 64  . Myelodysplastic syndrome 04/02/2011  . Sinusitis 07/18/2011  . Anemia 07/18/2011  . Arthritis 07/18/2011  . Cataract extraction status of right eye 08/18/2011  . Anxiety and depression 08/18/2011  . Bronchitis, acute 12/20/2011  . Headache 03/02/2012  . AV  heart block     a. 10/2007 MDT Versa dual chamber PPM.  . Bladder cancer 12/2002  . Closed intertrochanteric fracture of left hip 05/27/2012    Past Surgical History  Procedure Date  . Cholecystectomy   . Pcm - medtronic   . Abdominal hysterectomy   . Shoulder open rotator cuff repair   . Foot surgery     bunionectomy    Loyce Dys, MS RD LDN Clinical Inpatient Dietitian Pager: [phone] Weekend/After hours pager: [phone]

## 2012-05-27 NOTE — Progress Notes (Signed)
Orthopedic Tech Progress Note Patient Details:  [name] 09/04/1923 [id number]  Patient ID: [name], female   DOB: [birth year], 79 y.o.   MRN: [id number] OHF applied to bed.   Tametha Banning T 05/27/2012, 9:00 PM

## 2012-05-27 NOTE — Consult Note (Signed)
ORTHOPAEDIC CONSULTATION  REQUESTING PHYSICIAN: Hollice Espy, MD  Chief Complaint: Left hip pain  HPI: [name] is a 79 y.o. female who complains of  moderate to severe left hip pain after a mechanical fall today. She had no other injuries. She says that she just fell directly on her left hip. Pain medication makes it better. Resting it makes it better. Movement makes it worse. She did not lose consciousness. She reports pain that goes down her knee, and even below her leg to some degree.   Past Medical History  Diagnosis Date  . CAD (coronary artery disease)     a. 10/2007 PCI Diag w/ 2.25 x 8mm Taxus Atom DES.;  b. 09/2011 Lexi MV: EF 69%, no ischemia/infarct.  Marland Kitchen PVD (peripheral vascular disease)   . Carotid artery stenosis   . HTN (hypertension)   . Hyperlipidemia   . Hypothyroidism   . CKD (chronic kidney disease), stage III   . Diabetes mellitus 07/16/2011  . Allergic state 07/16/2011  . Insomnia 07/16/2011  . Recurrent UTI 07/16/2011  . Asthma     childhood, recurrent at 24  . Myelodysplastic syndrome 04/02/2011  . Sinusitis 07/18/2011  . Anemia 07/18/2011  . Arthritis 07/18/2011  . Cataract extraction status of right eye 08/18/2011  . Anxiety and depression 08/18/2011  . Bronchitis, acute 12/20/2011  . Headache 03/02/2012  . AV heart block     a. 10/2007 MDT Versa dual chamber PPM.  . Bladder cancer 12/2002  . Closed intertrochanteric fracture of left hip 05/27/2012   Past Surgical History  Procedure Date  . Cholecystectomy   . Pcm - medtronic   . Abdominal hysterectomy   . Shoulder open rotator cuff repair   . Foot surgery     bunionectomy   History   Social History  . Marital Status: Widowed    Spouse Name: N/A    Number of Children: N/A  . Years of Education: N/A   Social History Main Topics  . Smoking status: Never Smoker   . Smokeless tobacco: Never Used  . Alcohol Use: No  . Drug Use: No  . Sexually Active: No   Other Topics Concern  . None    Social History Narrative  . None   Family History  Problem Relation Age of Onset  . Heart disease Mother     chf  . Arthritis Mother   . Arthritis Father   . Kidney disease Father   . Hypertension Father   . Cancer Brother     lung  . Arthritis Daughter     MVA with very serious injuries   Allergies  Allergen Reactions  . Codeine Anaphylaxis and Swelling  . Penicillins Anaphylaxis and Swelling  . Sulfonamide Derivatives Anaphylaxis and Swelling   Prior to Admission medications   Medication Sig Start Date End Date Taking? Authorizing Provider  acetaminophen (TYLENOL) 500 MG tablet Take 500 mg by mouth daily as needed. For pain   Yes Historical Provider, MD  amLODipine (NORVASC) 5 MG tablet Take 5 mg by mouth daily.   Yes Historical Provider, MD  aspirin 81 MG tablet Take 81 mg by mouth daily.     Yes Historical Provider, MD  cefdinir (OMNICEF) 300 MG capsule Take 1 capsule (300 mg total) by mouth 2 (two) times daily. 05/19/12  Yes Bradd Canary, MD  levothyroxine (SYNTHROID, LEVOTHROID) 112 MCG tablet Take 112 mcg by mouth daily.   Yes Historical Provider, MD  metFORMIN (GLUMETZA) 1000  MG (MOD) 24 hr tablet Take 1 tablet (1,000 mg total) by mouth 2 (two) times daily with a meal. 10/07/11  Yes Bradd Canary, MD  Mometasone Furo-Formoterol Fum (DULERA) 200-5 MCG/ACT AERO Inhale 2 puffs into the lungs 3 (three) times daily as needed. For shortness of breath   Yes Historical Provider, MD  montelukast (SINGULAIR) 10 MG tablet Take 10 mg by mouth daily.    Yes Historical Provider, MD  nitroGLYCERIN (NITROSTAT) 0.4 MG SL tablet Place 1 tablet (0.4 mg total) under the tongue every 5 (five) minutes x 3 doses as needed for chest pain. 03/29/12  Yes Ok Anis, NP  PARoxetine (PAXIL) 10 MG tablet Take 10 mg by mouth every morning.   Yes Historical Provider, MD  rosuvastatin (CRESTOR) 20 MG tablet Take 20 mg by mouth at bedtime.    Yes Historical Provider, MD   Dg Chest 1  View  05/26/2012  *RADIOLOGY REPORT*  Clinical Data: Fall.  Cough and SOB.  CHEST - 1 VIEW  Comparison: 03/27/2012  Findings: Left chest wall pacer device is noted with lead in the right atrial appendage and right ventricle.  Normal heart size.  No pleural effusion identified.  Chronic interstitial lung disease is again noted and appears similar to previous exam.  No superimposed airspace consolidation noted.  IMPRESSION:  1.  No acute cardiopulmonary abnormalities. 2.  Interstitial lung disease.   Original Report Authenticated By: Signa Kell, M.D.    Dg Hip Complete Left  05/26/2012  *RADIOLOGY REPORT*  Clinical Data: Fall, left hip pain  LEFT HIP - COMPLETE 2+ VIEW  Comparison: None.  Findings: Hips are located.  No evidence of pelvic fracture  or sacral fracture.  Dedicated view of the left hip demonstrates a subtle cortical lucency/disruption at the greater trochanter.  IMPRESSION:   Cortical destruction of the of the greater trochanter. Differential includes degenerative change, osteomyelitis, or fracture.  Consider CT of the pelvis if concern for fracture.  Findings discussed with Schuben PA on 05/26/2012 1735 hours   Original Report Authenticated By: Genevive Bi, M.D.    Dg Femur Left  05/26/2012  *RADIOLOGY REPORT*  Clinical Data: Fall, left leg pain  LEFT FEMUR - 2 VIEW  Comparison: .  None  Findings: No evidence of fracture of the distal left femur. Vascular calcifications noted.  IMPRESSION: No distal left femur fracture.   Original Report Authenticated By: Genevive Bi, M.D.    Ct Hip Left Wo Contrast  05/26/2012  *RADIOLOGY REPORT*  Clinical Data: Left hip pain secondary to a fall.  CT OF THE LEFT HIP WITHOUT CONTRAST  Technique:  Multidetector CT imaging was performed according to the standard protocol. Multiplanar CT image reconstructions were also generated.  Comparison: Radiographs dated 05/26/2012  Findings: There is a comminuted intertrochanteric fracture of the proximal left  femur.  The superior aspect of the greater trochanter is comminuted.  Minimal osteophytes on the acetabulum.  IMPRESSION: Intertrochanteric fracture of the proximal left femur.  Comminution of the greater trochanter.   Original Report Authenticated By: Francene Boyers, M.D.     Positive ROS: All other systems have been reviewed and were otherwise negative with the exception of those mentioned in the HPI and as above.  Physical Exam: General: Alert, no acute distress Cardiovascular: No pedal edema Respiratory: No cyanosis, no use of accessory musculature GI: No organomegaly, abdomen is soft and non-tender Skin: No lesions in the area of chief complaint Neurologic: Sensation intact distally Psychiatric: Patient is competent for  consent with normal mood and affect Lymphatic: No axillary or cervical lymphadenopathy  MUSCULOSKELETAL: Left hip has positive pain to palpation with a positive log roll. She cannot do a straight leg raise. EHL and FHL are firing.  Assessment: Left intertrochanteric hip fracture with multiple medical comorbidities as listed above  Plan: This is an acute severe injury, which threatened her long-term function and ambulatory status. I recommended surgical intervention with internal fixation.  The risks benefits and alternatives were discussed with the patient including but not limited to the risks of nonoperative treatment, versus surgical intervention including infection, bleeding, nerve injury, malunion, nonunion, the need for revision surgery, hardware prominence, hardware failure, the need for hardware removal, blood clots, cardiopulmonary complications, morbidity, mortality, among others, and they were willing to proceed.    We will plan to proceed with surgery Saturday, likely midday. N.p.o., hold anticoagulation, sequential compression device is okay.    Modene Andy P, MD Cell [phone] Pager [phone]  05/27/2012 2:51 AM

## 2012-05-27 NOTE — Progress Notes (Signed)
ANTICOAGULATION CONSULT NOTE - Initial Consult  Pharmacy Consult for coumadin Indication: VTE prophylaxis  Allergies  Allergen Reactions  . Codeine Anaphylaxis and Swelling  . Penicillins Anaphylaxis and Swelling  . Sulfonamide Derivatives Anaphylaxis and Swelling    Patient Measurements: Height: 5' (152.4 cm) Weight: 104 lb 11.5 oz (47.5 kg) (Per 05/15/12 documentation) IBW/kg (Calculated) : 45.5  Heparin Dosing Weight:   Vital Signs: Temp: 98 F (36.7 C) (01/11 2002) Temp src: Axillary (01/11 2002) BP: 111/72 mmHg (01/11 2002) Pulse Rate: 81  (01/11 2002)  Labs:  [facility] 05/27/12 0705 05/26/12 1704  HGB 8.2* 9.4*  HCT 26.3* 29.2*  PLT 123* 132*  APTT -- --  LABPROT -- --  INR -- --  HEPARINUNFRC -- --  CREATININE 1.32* 1.14*  CKTOTAL -- --  CKMB -- --  TROPONINI -- --    Estimated Creatinine Clearance: 22 ml/min (by C-G formula based on Cr of 1.32).   Medical History: Past Medical History  Diagnosis Date  . CAD (coronary artery disease)     a. 10/2007 PCI Diag w/ 2.25 x 8mm Taxus Atom DES.;  b. 09/2011 Lexi MV: EF 69%, no ischemia/infarct.  Marland Kitchen PVD (peripheral vascular disease)   . Carotid artery stenosis   . HTN (hypertension)   . Hyperlipidemia   . Hypothyroidism   . CKD (chronic kidney disease), stage III   . Diabetes mellitus 07/16/2011  . Allergic state 07/16/2011  . Insomnia 07/16/2011  . Recurrent UTI 07/16/2011  . Asthma     childhood, recurrent at 50  . Myelodysplastic syndrome 04/02/2011  . Sinusitis 07/18/2011  . Anemia 07/18/2011  . Arthritis 07/18/2011  . Cataract extraction status of right eye 08/18/2011  . Anxiety and depression 08/18/2011  . Bronchitis, acute 12/20/2011  . Headache 03/02/2012  . AV heart block     a. 10/2007 MDT Versa dual chamber PPM.  . Bladder cancer 12/2002  . Closed intertrochanteric fracture of left hip 05/27/2012    Medications:  Scheduled:    . aspirin  81 mg Oral Daily  . atorvastatin  40 mg Oral q1800  . [COMPLETED]  ciprofloxacin  400 mg Intravenous Once  . docusate sodium  100 mg Oral BID  . enoxaparin (LOVENOX) injection  30 mg Subcutaneous Q24H  . fentaNYL      . insulin aspart  0-15 Units Subcutaneous Q4H  . levothyroxine  112 mcg Oral QAC breakfast  . mometasone-formoterol  2 puff Inhalation BID  . montelukast  10 mg Oral Daily  . [COMPLETED]  morphine injection  4 mg Intravenous Once  .  morphine injection  4 mg Intravenous Once  . PARoxetine  10 mg Oral Daily  . senna  1 tablet Oral BID  . vancomycin  1,000 mg Intravenous Q12H  . warfarin  2.5 mg Oral Once  . Warfarin - Pharmacist Dosing Inpatient   Does not apply q1800  . [DISCONTINUED] amLODipine  5 mg Oral Daily  . [DISCONTINUED] aspirin  81 mg Oral Daily  . [DISCONTINUED] ciprofloxacin  400 mg Intravenous Q24H  . [DISCONTINUED] enoxaparin (LOVENOX) injection  40 mg Subcutaneous Q24H  . [DISCONTINUED] vancomycin  1,000 mg Intravenous Once   Infusions:    . 0.45 % NaCl with KCl 20 mEq / L    . [DISCONTINUED] sodium chloride 75 mL/hr at 05/27/12 0945    Assessment: 79 yo female s/p ortho surgery will be put on coumadin for VTE prophylaxis.  Previous INR good and not on anticoagulant prior to admission.  Goal of Therapy:  INR 2-3    Plan:  1) Coumadin 2.5mg  po x1 2) Stat PT/INR 3) Daily PT/INR  Betsie Peckman, [name] 05/27/2012,8:16 PM

## 2012-05-27 NOTE — Progress Notes (Signed)
PROGRESS NOTE  [name] HQI:[id number] DOB: 03/21/1924 DOA: 05/26/2012 PCP: Danise Edge, MD  Brief narrative: 79 yr old CF admitted 05/26/12 with mechanical fall and L intertochanteric hip #  Past medical history-As per Problem list Chart reviewed as below-  History of myelodysplasia followed by Dr. Cyndie Chime since July 2009-usually gets Aranesp per patient-goal Hb about 10  Admission 03/27/2012 for chest pain-conservative management at that time  Cardiac stress test 10/14/2011 = normal stress nuclear, LVEF 60% normal wall motion  Admission 02/23/1999 and for fall, right sided chest pain, right patellar fracture-none operated  Admission 01/11/2011 for chest pain  Admission 10/23/2007 chest pain-noted to have CAD, placement DES, placement [employer] pacemaker for AV block-noted also to have carotid artery disease 08/18/2007 MRA 50% right internal carotid artery stenosis, focal 7 mm left internal carotid artery aneurysm--started to be followed by oncology at this admission  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 instillation 8 2004  Consultants:  Orthopedics, Dr. Dion Saucier  Procedures:  Chest x-ray 1 view 05/26/2012= no acute abnormalities, interstitial lung disease  Left hip x-ray complete 05/26/2012= cortical destruction greater trochanter  Left femur x-ray 05/26/2012 = no distal left femoral fracture  CT scan left hip 05/26/2012=Intertrochanteric fracture of the proximal left femur. Comminution of the greater trochanter.   Antibiotics:  cipro 400 1.10   Subjective  States that she will she is in pain. No nausea vomiting. Is hungry. No family in room.    Objective    Interim History: None  Telemetry: Have placed on telemetry  Objective: Filed Vitals:   05/26/12 2242 05/26/12 2300 05/27/12 0000 05/27/12 0554  BP: 100/42   107/43  Pulse: 73   70  Temp: 99.4 F (37.4 C)    99.4 F (37.4 C)  TempSrc: Oral     Resp: 18  18 18   Height:  5' (1.524 m)    Weight:  47.5 kg (104 lb 11.5 oz)    SpO2: 94%  96% 99%    Intake/Output Summary (Last 24 hours) at 05/27/12 1141 Last data filed at 05/27/12 0500  Gross per 24 hour  Intake      0 ml  Output    300 ml  Net   -300 ml    Exam:  General: Alert pleasant frail oriented Caucasian female Cardiovascular: S1-S2 no murmur rub or gallop Respiratory: Clinically clear with no added sound Abdomen: Soft nontender nondistended Skin left lower extremity externally Neuro grossly intact  Data Reviewed: Basic Metabolic Panel:  Lab 05/27/12 1324 05/26/12 1704  NA 137 141  K 5.1 4.8  CL 103 105  CO2 24 24  GLUCOSE 106* 139*  BUN 17 20  CREATININE 1.32* 1.14*  CALCIUM 8.6 8.9  MG -- --  PHOS -- --   Liver Function Tests: No results found for this basename: AST:5,ALT:5,ALKPHOS:5,BILITOT:5,PROT:5,ALBUMIN:5 in the last 168 hours No results found for this basename: LIPASE:5,AMYLASE:5 in the last 168 hours No results found for this basename: AMMONIA:5 in the last 168 hours CBC:  Lab 05/27/12 0705 05/26/12 1704  WBC 8.7 9.8  NEUTROABS -- 8.1*  HGB 8.2* 9.4*  HCT 26.3* 29.2*  MCV 101.2* 99.7  PLT 123* 132*   Cardiac Enzymes: No results found for this basename: CKTOTAL:5,CKMB:5,CKMBINDEX:5,TROPONINI:5 in the last 168 hours BNP: No components found with this basename: POCBNP:5 CBG:  Lab 05/27/12 0824 05/27/12 0440  GLUCAP 111* 109*    Recent Results (from the past  240 hour(s))  URINE CULTURE     Status: Normal   Collection Time   05/24/12  2:44 PM      Component Value Range Status Comment   Colony Count NO GROWTH   Final    Organism ID, Bacteria NO GROWTH   Final   SURGICAL PCR SCREEN     Status: Normal   Collection Time   05/27/12  2:44 AM      Component Value Range Status Comment   MRSA, PCR NEGATIVE  NEGATIVE Final    Staphylococcus aureus NEGATIVE  NEGATIVE Final      Studies:                           All Imaging reviewed and is as per above notation   Scheduled Meds:   . amLODipine  5 mg Oral Daily  . aspirin  81 mg Oral Daily  . atorvastatin  40 mg Oral q1800  . ciprofloxacin  400 mg Intravenous Q24H  . insulin aspart  0-15 Units Subcutaneous Q4H  . levothyroxine  112 mcg Oral QAC breakfast  . mometasone-formoterol  2 puff Inhalation BID  . montelukast  10 mg Oral Daily  .  morphine injection  4 mg Intravenous Once  . PARoxetine  10 mg Oral Daily  . vancomycin  1,000 mg Intravenous Once   Continuous Infusions:   . sodium chloride 75 mL/hr at 05/27/12 0945     Assessment/Plan: 1. Left intertrochanteric hip fracture-preparing for surgery at noon today 1/11. I have typed and screened her.  Continue Incentive spirometry.  Pain management, DVT prophylaxis per orthopedic surgeon-appreciate input 2. ? Pyelonephritis -this was a catheterized specimen-continue ciprofloxacin 500 daily IV. 3. Extensive CAD history-[see above]-continue perioperative aspirin 81 mg-apparently not on beta blocker or ACE inhibitor.  Probably 2/2 to #4 4. DM-CBG q 4 hrly till surgery as NPO-CBG's 99-111.  Continue Mod SSI coverage 5. Bradycardia necessitating pacemaker placed 6/09-stable at present 6. H/o asymptomatic ICA stenosis r side with Aneurysm L side-needs out-patient follow-up-used to be on Plavix for this.  Not is not for unclear reason. 7. Htn-hold Amlodipine 5 mg daily given borderline low pressures.  Likely 2/2 to IV pain meds  Keep NS 75 cc/hr till surgery 8. Depression-continue Paxil 10mg  daily 9. Hld-Continue Lipitor 40 mg daily  Code Status: Full Family Communication: None at bedside  Disposition Plan: Per Orthopedics   Pleas Koch, MD  [facility] Pager [phone] 05/27/2012, 11:41 AM    LOS: 1 day

## 2012-05-27 NOTE — Preoperative (Signed)
Beta Blockers   Reason not to administer Beta Blocker2s:Not Applicable

## 2012-05-28 LAB — BASIC METABOLIC PANEL
BUN: 14 mg/dL (ref 6–23)
BUN: 16 mg/dL (ref 6–23)
Chloride: 100 mEq/L (ref 96–112)
Creatinine, Ser: 1.29 mg/dL — ABNORMAL HIGH (ref 0.50–1.10)
GFR calc Af Amer: 43 mL/min — ABNORMAL LOW (ref >90)
GFR calc Af Amer: 42 mL/min — ABNORMAL LOW (ref >90)
GFR calc non Af Amer: 37 mL/min — ABNORMAL LOW (ref >90)
GFR calc non Af Amer: 36 mL/min — ABNORMAL LOW (ref >90)
Potassium: 5.3 mEq/L — ABNORMAL HIGH (ref 3.5–5.1)
Potassium: 4.9 mEq/L (ref 3.5–5.1)
Sodium: 134 mEq/L — ABNORMAL LOW (ref 135–145)

## 2012-05-28 LAB — CBC
HCT: 26.3 % — ABNORMAL LOW (ref 36.0–46.0)
Hemoglobin: 8.3 g/dL — ABNORMAL LOW (ref 12.0–15.0)
MCHC: 31.6 g/dL (ref 30.0–36.0)
RBC: 2.68 MIL/uL — ABNORMAL LOW (ref 3.87–5.11)
WBC: 8.7 10*3/uL (ref 4.0–10.5)

## 2012-05-28 LAB — GLUCOSE, CAPILLARY
Glucose-Capillary: 116 mg/dL — ABNORMAL HIGH (ref 70–99)
Glucose-Capillary: 121 mg/dL — ABNORMAL HIGH (ref 70–99)
Glucose-Capillary: 145 mg/dL — ABNORMAL HIGH (ref 70–99)
Glucose-Capillary: 147 mg/dL — ABNORMAL HIGH (ref 70–99)
Glucose-Capillary: 239 mg/dL — ABNORMAL HIGH (ref 70–99)

## 2012-05-28 LAB — PROTIME-INR
INR: 1.14 (ref 0.00–1.49)
Prothrombin Time: 14.4 seconds (ref 11.6–15.2)

## 2012-05-28 MED ORDER — WARFARIN SODIUM 5 MG PO TABS
5.0000 mg | ORAL_TABLET | Freq: Once | ORAL | Status: AC
  Administered 2012-05-28: 5 mg via ORAL
  Filled 2012-05-28: qty 1

## 2012-05-28 MED ORDER — INSULIN ASPART 100 UNIT/ML ~~LOC~~ SOLN
0.0000 [IU] | Freq: Three times a day (TID) | SUBCUTANEOUS | Status: DC
  Administered 2012-05-28 – 2012-05-29 (×2): 1 [IU] via SUBCUTANEOUS
  Administered 2012-05-29: 3 [IU] via SUBCUTANEOUS
  Administered 2012-05-29: 1 [IU] via SUBCUTANEOUS
  Administered 2012-05-30: 2 [IU] via SUBCUTANEOUS
  Administered 2012-05-30 – 2012-05-31 (×3): 1 [IU] via SUBCUTANEOUS

## 2012-05-28 MED ORDER — SODIUM CHLORIDE 0.9 % IV SOLN
INTRAVENOUS | Status: DC
  Administered 2012-05-28: 11:00:00 via INTRAVENOUS

## 2012-05-28 NOTE — Clinical Social Work Note (Signed)
CSW consulted for SNF. Awaiting PT/OT eval and recommendations. Weekday CSW to f/u and assist with SNF placement if needed.  Dellie Burns, MSW, LCSWA [phone] (Weekends 8:00am-4:30pm)

## 2012-05-28 NOTE — Progress Notes (Signed)
ANTICOAGULATION CONSULT NOTE - Follow Up Consult  Pharmacy Consult for coumadin Indication: VTE prophylaxis  Allergies  Allergen Reactions  . Codeine Anaphylaxis and Swelling  . Penicillins Anaphylaxis and Swelling  . Sulfonamide Derivatives Anaphylaxis and Swelling    Patient Measurements: Height: 5' (152.4 cm) Weight: 104 lb 11.5 oz (47.5 kg) (Per 05/15/12 documentation) IBW/kg (Calculated) : 45.5  Heparin Dosing Weight:   Vital Signs:    Labs:  Basename 05/28/12 0930 05/28/12 0505 05/27/12 2141 05/27/12 0705  HGB -- 8.3* 8.6* --  HCT -- 26.3* 26.9* 26.3*  PLT -- 91* 100* 123*  APTT -- -- -- --  LABPROT -- 14.4 14.4 --  INR -- 1.14 1.14 --  HEPARINUNFRC -- -- -- --  CREATININE 1.29* 1.26* 1.15* --  CKTOTAL -- -- -- --  CKMB -- -- -- --  TROPONINI -- -- -- --    Estimated Creatinine Clearance: 22.5 ml/min (by C-G formula based on Cr of 1.29).   Medications:  Scheduled:    . aspirin  81 mg Oral Daily  . atorvastatin  40 mg Oral q1800  . docusate sodium  100 mg Oral BID  . enoxaparin (LOVENOX) injection  30 mg Subcutaneous Q24H  . [EXPIRED] fentaNYL      . insulin aspart  0-9 Units Subcutaneous TID WC  . levothyroxine  112 mcg Oral QAC breakfast  . mometasone-formoterol  2 puff Inhalation BID  . montelukast  10 mg Oral Daily  .  morphine injection  4 mg Intravenous Once  . PARoxetine  10 mg Oral Daily  . senna  1 tablet Oral BID  . [COMPLETED] vancomycin  1,000 mg Intravenous Q12H  . [COMPLETED] warfarin  2.5 mg Oral Once  . warfarin  5 mg Oral ONCE-1800  . Warfarin - Pharmacist Dosing Inpatient   Does not apply q1800  . [DISCONTINUED] amLODipine  5 mg Oral Daily  . [DISCONTINUED] ciprofloxacin  400 mg Intravenous Q24H  . [DISCONTINUED] enoxaparin (LOVENOX) injection  40 mg Subcutaneous Q24H  . [DISCONTINUED] insulin aspart  0-15 Units Subcutaneous Q4H  . [DISCONTINUED] vancomycin  1,000 mg Intravenous Once   Infusions:    . sodium chloride 75 mL/hr  at 05/28/12 1050  . [DISCONTINUED] 0.45 % NaCl with KCl 20 mEq / L 75 mL/hr (05/27/12 2155)  . [DISCONTINUED] sodium chloride 75 mL/hr at 05/27/12 0945    Assessment: 79 yo female s/p ortho surgery is currently on subtherapeutic coumadin.  INR today was 1.14.  Goal of Therapy:  INR 2-3    Plan:  1) Coumadin 5mg  po x1 2) INR in am  Etheline Geppert, [name] 05/28/2012,11:16 AM

## 2012-05-28 NOTE — Progress Notes (Addendum)
PROGRESS NOTE  [name]:[id number] DOB: May 10, 1924 DOA: 05/26/2012 PCP: Danise Edge, MD  Brief narrative: 79 yr old CF admitted 05/26/12 with mechanical fall and L intertochanteric hip #  Past medical history-As per Problem list Chart reviewed  In progress note dated 1.11.14  Consultants:  Orthopedics, Dr. Dion Saucier  Procedures:  Chest x-ray 1 view 05/26/2012= no acute abnormalities, interstitial lung disease  Left hip x-ray complete 05/26/2012= cortical destruction greater trochanter  Left femur x-ray 05/26/2012 = no distal left femoral fracture  CT scan left hip 05/26/2012=Intertrochanteric fracture of the proximal left femur. Comminution of the greater trochanter.   Antibiotics:  cipro 400 1.10   Subjective  Doing better, no specific concernes Poor appetite today, no stool yet-doesn;t wish laxative. Denies SOB or CP    Objective    Interim History: None  Telemetry: Have placed on telemetry-Still not on  Objective: Filed Vitals:   05/27/12 2100 05/28/12 0000 05/28/12 0400 05/28/12 0858  BP: 101/52     Pulse: 70     Temp: 98 F (36.7 C)     TempSrc: Axillary     Resp: 18 16 16    Height:      Weight:      SpO2: 100% 100% 100% 100%    Intake/Output Summary (Last 24 hours) at 05/28/12 0955 Last data filed at 05/28/12 2952  Gross per 24 hour  Intake 2041.25 ml  Output   2450 ml  Net -408.75 ml    Exam:  General: Alert pleasant frail oriented Caucasian female Cardiovascular: S1-S2 no murmur rub or gallop Respiratory: Clinically clear with no added sound Abdomen: Soft nontender nondistended Skin left lower extremity has 2 bandages, upper one slightly soaked through.  ROM intact Neuro grossly intact  Data Reviewed: Basic Metabolic Panel:  Lab 05/28/12 8413 05/27/12 2141 05/27/12 0705 05/26/12 1704  NA 134* -- 137 141  K 5.3* -- 5.1 --  CL 100 -- 103 105  CO2 25 -- 24 24  GLUCOSE 120* -- 106* 139*  BUN 14 -- 17 20  CREATININE 1.26*  1.15* 1.32* 1.14*  CALCIUM 8.3* -- 8.6 8.9  MG -- -- -- --  PHOS -- -- -- --   Liver Function Tests: No results found for this basename: AST:5,ALT:5,ALKPHOS:5,BILITOT:5,PROT:5,ALBUMIN:5 in the last 168 hours No results found for this basename: LIPASE:5,AMYLASE:5 in the last 168 hours No results found for this basename: AMMONIA:5 in the last 168 hours CBC:  Lab 05/28/12 0505 05/27/12 2141 05/27/12 0705 05/26/12 1704  WBC 8.7 10.9* 8.7 9.8  NEUTROABS -- -- -- 8.1*  HGB 8.3* 8.6* 8.2* 9.4*  HCT 26.3* 26.9* 26.3* 29.2*  MCV 98.1 97.5 101.2* 99.7  PLT 91* 100* 123* 132*   Cardiac Enzymes: No results found for this basename: CKTOTAL:5,CKMB:5,CKMBINDEX:5,TROPONINI:5 in the last 168 hours BNP: No components found with this basename: POCBNP:5 CBG:  Lab 05/28/12 0819 05/28/12 0451 05/28/12 0008 05/27/12 2159 05/27/12 1825  GLUCAP 147* 121* 239* 157* 145*    Recent Results (from the past 240 hour(s))  URINE CULTURE     Status: Normal   Collection Time   05/24/12  2:44 PM      Component Value Range Status Comment   Colony Count NO GROWTH   Final    Organism ID, Bacteria NO GROWTH   Final   SURGICAL PCR SCREEN     Status: Normal   Collection Time   05/27/12  2:44 AM      Component Value Range Status Comment  MRSA, PCR NEGATIVE  NEGATIVE Final    Staphylococcus aureus NEGATIVE  NEGATIVE Final      Studies:              All Imaging reviewed and is as per above notation   Scheduled Meds:    . aspirin  81 mg Oral Daily  . atorvastatin  40 mg Oral q1800  . docusate sodium  100 mg Oral BID  . enoxaparin (LOVENOX) injection  30 mg Subcutaneous Q24H  . insulin aspart  0-15 Units Subcutaneous Q4H  . levothyroxine  112 mcg Oral QAC breakfast  . mometasone-formoterol  2 puff Inhalation BID  . montelukast  10 mg Oral Daily  .  morphine injection  4 mg Intravenous Once  . PARoxetine  10 mg Oral Daily  . senna  1 tablet Oral BID  . Warfarin - Pharmacist Dosing Inpatient   Does not  apply q1800   Continuous Infusions:     Assessment/Plan: 1. Left intertrochanteric hip fracture, s/p IM nail1/11-  Continue Incentive spirometry.  Pain management-meperidine discontinued given high risk and poor benefit, DVT prophylaxis per orthopedic surgeon his Coumadin-appreciate input 2. Mild hypotension-possibly a result of surgery/opiates. Monitor-conitnue saline c/out potassium 3. Hyperkalemia-Saline with supplemental K d/c'd 05/28/12.  Rpt Bmet ordered for 1.13.14  4. ? Pyelonephritis -this was a catheterized specimen-no growth-d/c abx 5. Extensive CAD history-[see above]-continue perioperative aspirin 81 mg-apparently not on beta blocker or ACE inhibitor.  Probably 2/2 to #4 6. DM-CBG changed to a.c./at bedtime-CBG's 120-141.  Continue Mod SSI coverage-takes metformin at home 7. Bradycardia necessitating pacemaker placed 6/09-stable at present 8. H/o Myelodysplasia-stabvle-rpt CBC am.  Takes Arenesp chronically as out-patient 9. H/o asymptomatic ICA stenosis r side with Aneurysm L side-needs out-patient follow-up-used to be on Plavix for this.  Not is not for unclear reason. 10. Htn-hold Amlodipine 5 mg daily given borderline low pressures.  Likely 2/2 to IV pain meds  Keep NS 75 cc/hr till surgery 11. Depression-continue Paxil 10mg  daily 12. Hld-Continue Lipitor 40 mg daily  Code Status: Full Family Communication: None at bedside-called both son and daughter, no answer Disposition Plan: Per Orthopedics-PT/Ot to ambulate patient today   Pleas Koch, MD  [facility] Pager [phone] 05/28/2012, 9:55 AM    LOS: 2 days

## 2012-05-28 NOTE — Progress Notes (Signed)
Patient ID: DEASHA CLENDENIN, female   DOB: August 19, 1923, 79 y.o.   MRN: [id number]     Subjective:  Patient reports pain as mild.  Patient doing well and denies any CP or SOB  Objective:   VITALS:   Filed Vitals:   05/28/12 0400 05/28/12 0800 05/28/12 0858 05/28/12 1046  BP:      Pulse:      Temp:      TempSrc:      Resp: 16 16    Height:      Weight:      SpO2: 100% 100% 100% 96%    ABD soft Sensation intact distally Dorsiflexion/Plantar flexion intact Incision: dressing C/D/I and no drainage      Assessment/Plan: 1 Day Post-Op   Principal Problem:  *Closed intertrochanteric fracture of left hip Active Problems:  HYPERLIPIDEMIA-MIXED  HYPERTENSION, UNSPECIFIED  HYPOTENSION, ORTHOSTATIC  Diabetes mellitus   Advance diet Up with therapy Continue plan per medicine WBAT ABLA continue to monitor.   Haskel Khan 05/28/2012, 11:50 AM   Teryl Lucy, MD Cell [phone] Pager [phone]

## 2012-05-28 NOTE — Evaluation (Signed)
Physical Therapy Evaluation Patient Details Name: [name] MRN: [id number] DOB: [birth year] Today's Date: 05/28/2012 Time: 1345-1430 PT Time Calculation (min): 45 min  PT Assessment / Plan / Recommendation Clinical Impression  79 yo s/p IM nail after fall related hip fracture presents with multiple fall risk factors, educated on risk factor managemtn including shortterm rehab needs and longterm need for balance and strength training maintenance.  Pt and family verbalized understanding. Did remarkably well on POD #1 to stand, transfer to [facility] then to recliner with no pain medicine and min-mod pain report.  Strong family support however pt will need post acute rehab, most likely SNF however will ask for CIR to review case.  Plan to see acutely as described in note below.    PT Assessment  Patient needs continued PT services    Follow Up Recommendations  SNF;CIR    Does the patient have the potential to tolerate intense rehabilitation      Barriers to Discharge Decreased caregiver support lived alone and family can provide intermittent assist.    Equipment Recommendations  None recommended by PT    Recommendations for Other Services     Frequency Min 3X/week    Precautions / Restrictions Precautions Precautions: Fall Precaution Comments: multiple fall risk factor including orthostasis, multiple medications, decrease strength, decreased balance ability Restrictions Weight Bearing Restrictions: Yes LLE Weight Bearing: Weight bearing as tolerated   Pertinent Vitals/Pain 5/10 declined use of pain meds, ice to hip following tx      Mobility  Bed Mobility Bed Mobility: Supine to Sit Supine to Sit: 3: Mod assist;HOB elevated;With rails Details for Bed Mobility Assistance: assist to left leg to left EOB, used bed pad to advance hips and minimize pain, cues for best hand placement to come to sitting at EOB Transfers Transfers: Sit to Stand;Stand to Sit Sit to Stand: 3: Mod  assist;From bed;With armrests;With upper extremity assist;From chair/3-in-1 Stand to Sit: 3: Mod assist;With upper extremity assist;To chair/3-in-1;With armrests Details for Transfer Assistance: cues for safest hand placement to/from RW and with arm rests. physical assist and cues to protect and minimize pain to left hip into sitting,  Ambulation/Gait Ambulation/Gait Assistance: Not tested (comment) (pivotal steps to chair with cues for sequencing with RW) Stairs: No Wheelchair Mobility Wheelchair Mobility: No    Shoulder Instructions     Exercises     PT Diagnosis: Difficulty walking;Acute pain  PT Problem List: Decreased strength;Decreased range of motion;Decreased activity tolerance;Decreased balance;Decreased mobility;Decreased knowledge of use of DME;Decreased knowledge of precautions;Pain PT Treatment Interventions: DME instruction;Gait training;Functional mobility training;Therapeutic exercise;Therapeutic activities;Patient/family education   PT Goals Acute Rehab PT Goals PT Goal Formulation: With patient/family Time For Goal Achievement: 06/11/12 Potential to Achieve Goals: Good Pt will go Supine/Side to Sit: with min assist;with rail PT Goal: Supine/Side to Sit - Progress: Goal set today Pt will go Sit to Supine/Side: with min assist;with HOB 0 degrees PT Goal: Sit to Supine/Side - Progress: Goal set today Pt will go Sit to Stand: with upper extremity assist;with supervision PT Goal: Sit to Stand - Progress: Goal set today Pt will go Stand to Sit: with supervision PT Goal: Stand to Sit - Progress: Goal set today Pt will Ambulate: 16 - 50 feet;with min assist;with rolling walker (WBAT left leg) PT Goal: Ambulate - Progress: Goal set today  Visit Information  Last PT Received On: 05/28/12 Assistance Needed: +1    Subjective Data  Subjective: Oh, I'm so ready to be out of bed Patient Stated Goal:  not to need to depend on anyone   Prior Functioning  Home Living Lives  With: Alone Available Help at Discharge: Family Type of Home: House Home Access: Stairs to enter [employer] of Steps: 2 (up to porch, the threshold to enter home) Entrance Stairs-Rails: Left Home Layout: One level Home Adaptive Equipment: Bedside commode/3-in-1;Tub transfer bench;Walker - rolling Additional Comments: pt has multiple injurious falls, most recent in Nov 2013, Oct 2012.  10/12 fall resulting in knee fracture and SNF admission Prior Function Level of Independence: Needs assistance Needs Assistance: Light Housekeeping Light Housekeeping: Moderate Able to Take Stairs?: Yes Driving: Yes Comments: Like to drive to post office and go shopping at [employer] Communication Communication: No difficulties    Cognition  Overall Cognitive Status: Appears within functional limits for tasks assessed/performed Arousal/Alertness: Awake/alert Orientation Level: Appears intact for tasks assessed Behavior During Session: [facility] for tasks performed    Extremity/Trunk Assessment Right Lower Extremity Assessment RLE ROM/Strength/Tone: Deficits RLE ROM/Strength/Tone Deficits: generally deconditioned with ability to bear bodyweight for transfers and walking but poor eccentric control and decreased muscular endurance Left Lower Extremity Assessment LLE ROM/Strength/Tone: Unable to fully assess;Due to pain;Due to precautions   Balance    End of Session PT - End of Session Equipment Utilized During Treatment: Gait belt Activity Tolerance: Patient tolerated treatment well;Patient limited by pain (did not want pain medicine, however did well) Patient left: in chair;with call bell/phone within reach;with family/visitor present Nurse Communication: Mobility status;Precautions;Weight bearing status (placed ice pack to left hip for pain control)  GP     Dennis Bast 05/28/2012, 5:24 PM

## 2012-05-28 NOTE — Progress Notes (Signed)
Received pt still in OR.  At. 1950, pt is back from OR per stretcher status post IM Nail Intertrochantericleft.  With Mepilex dressing dry and intact.  Pt is drowsy but arousable and conversant.  Family at bedside.   Pt was re-oriented to room and use of call bell.  Vital signs stable. Will continue to monitor.

## 2012-05-28 NOTE — Progress Notes (Signed)
Cont pulse ox discontinued d/t 02 sats >97% on RA and per order.  Will continue to monitor for status changes.

## 2012-05-29 ENCOUNTER — Inpatient Hospital Stay (HOSPITAL_COMMUNITY): Payer: Medicare Other

## 2012-05-29 ENCOUNTER — Encounter (HOSPITAL_COMMUNITY): Payer: Self-pay

## 2012-05-29 DIAGNOSIS — R296 Repeated falls: Secondary | ICD-10-CM

## 2012-05-29 DIAGNOSIS — S72143A Displaced intertrochanteric fracture of unspecified femur, initial encounter for closed fracture: Secondary | ICD-10-CM

## 2012-05-29 LAB — CBC
MCH: 31.6 pg (ref 26.0–34.0)
MCHC: 32.4 g/dL (ref 30.0–36.0)
Platelets: 84 10*3/uL — ABNORMAL LOW (ref 150–400)

## 2012-05-29 LAB — BASIC METABOLIC PANEL
Calcium: 8.5 mg/dL (ref 8.4–10.5)
GFR calc Af Amer: 43 mL/min — ABNORMAL LOW (ref >90)
GFR calc non Af Amer: 37 mL/min — ABNORMAL LOW (ref >90)
Sodium: 138 mEq/L (ref 135–145)

## 2012-05-29 LAB — CBC WITH DIFFERENTIAL/PLATELET
Basophils Absolute: 0 10*3/uL (ref 0.0–0.1)
Basophils Relative: 0 % (ref 0–1)
Eosinophils Absolute: 0.4 10*3/uL (ref 0.0–0.7)
Eosinophils Relative: 3 % (ref 0–5)
HCT: 32.1 % — ABNORMAL LOW (ref 36.0–46.0)
MCHC: 33.6 g/dL (ref 30.0–36.0)
MCV: 90.9 fL (ref 78.0–100.0)
Monocytes Absolute: 1.6 10*3/uL — ABNORMAL HIGH (ref 0.1–1.0)
Platelets: 85 10*3/uL — ABNORMAL LOW (ref 150–400)
RDW: 18.3 % — ABNORMAL HIGH (ref 11.5–15.5)

## 2012-05-29 LAB — GLUCOSE, CAPILLARY: Glucose-Capillary: 205 mg/dL — ABNORMAL HIGH (ref 70–99)

## 2012-05-29 LAB — PROTIME-INR
INR: 1.33 (ref 0.00–1.49)
Prothrombin Time: 16.2 seconds — ABNORMAL HIGH (ref 11.6–15.2)

## 2012-05-29 MED ORDER — WARFARIN SODIUM 5 MG PO TABS
5.0000 mg | ORAL_TABLET | Freq: Once | ORAL | Status: AC
  Administered 2012-05-29: 5 mg via ORAL
  Filled 2012-05-29: qty 1

## 2012-05-29 NOTE — Evaluation (Signed)
Occupational Therapy Evaluation Patient Details Name: [name] MRN: [id number] DOB: [birth year] Today's Date: 05/29/2012 Time: 4098-1191 OT Time Calculation (min): 44 min  OT Assessment / Plan / Recommendation Clinical Impression  Pt admitted for L IM Nail after fall at home which has caused pt to lose I with her basic mobilty and adls.  Pt with previous falls in last 2 years as well.  Pt would benefit from cont OT to increase I with adls so she can return home I[place].  Pt will need some form of rehab post acute since she does live alone.  Could tolerate 3 hour for inpatient rehab; unsure if she could reach mod I level of care to be able to d/c Ily home.    OT Assessment  Patient needs continued OT Services    Follow Up Recommendations  SNF;CIR;Other (comment) (SNF if feel pt cannot reach Mod I level at CIR.  )    Barriers to Discharge Decreased caregiver support pt lives alone  Equipment Recommendations  None recommended by OT    Recommendations for Other Services    Frequency  Min 2X/week    Precautions / Restrictions Precautions Precautions: Fall Precaution Comments: multiple fall risk factor including orthostasis, multiple medications, decrease strength, decreased balance ability Restrictions Weight Bearing Restrictions: Yes LLE Weight Bearing: Weight bearing as tolerated   Pertinent Vitals/Pain Pt with 7/10 pain.  Nursing in room when this was stated and heard.  Vitals stable.    ADL  Eating/Feeding: Performed;Independent Where Assessed - Eating/Feeding: Chair Grooming: Performed;Wash/dry hands;Min guard Where Assessed - Grooming: Supported standing Upper Body Bathing: Performed;Set up Where Assessed - Upper Body Bathing: Unsupported sitting Lower Body Bathing: Performed;Maximal assistance Where Assessed - Lower Body Bathing: Supported sit to stand Upper Body Dressing: Performed;Set up Where Assessed - Upper Body Dressing: Unsupported sitting Lower Body Dressing:  Performed;Maximal assistance Where Assessed - Lower Body Dressing: Supported sit to stand Toilet Transfer: Performed;Minimal assistance Toilet Transfer Method: Surveyor, minerals: Comfort height toilet;Grab bars Toileting - Architect and Hygiene: Performed;Minimal assistance Where Assessed - Engineer, mining and Hygiene: Standing Equipment Used: Rolling walker Transfers/Ambulation Related to ADLs: pt walked from bed to bathroom with min guard assist to min assist at times.  Pt transferred with min assist with cues to let herself down slowly and to push up from surface she is leaving. ADL Comments: Pt needs increased assist with LE adls due to pain.    OT Diagnosis: Generalized weakness;Acute pain  OT Problem List: Decreased strength;Impaired balance (sitting and/or standing);Decreased knowledge of use of DME or AE;Decreased knowledge of precautions;Pain OT Treatment Interventions: Self-care/ADL training;DME and/or AE instruction;Therapeutic activities   OT Goals Acute Rehab OT Goals OT Goal Formulation: With patient Time For Goal Achievement: 06/12/12 Potential to Achieve Goals: Good ADL Goals Pt Will Perform Grooming: with supervision;Standing at sink ADL Goal: Grooming - Progress: Goal set today Pt Will Perform Lower Body Bathing: with supervision;Sit to stand from chair;Sitting in shower;Other (comment) (on tub bench.) ADL Goal: Lower Body Bathing - Progress: Goal set today Pt Will Perform Lower Body Dressing: with min assist;Sit to stand from chair;Supported ADL Goal: Lower Body Dressing - Progress: Goal set today Pt Will Perform Tub/Shower Transfer: Tub transfer;with supervision;Transfer tub bench ADL Goal: Tub/Shower Transfer - Progress: Goal set today Additional ADL Goal #1: Pt will complete all aspects of toileting with 3:1 over commode and S. ADL Goal: Additional Goal #1 - Progress: Goal set today  Visit Information  Last OT  Received On: 05/29/12 Assistance Needed: +1    Subjective Data  Subjective: "I just want to do for myself again." Patient Stated Goal: to be independent.   Prior Functioning     Home Living Lives With: Alone Available Help at Discharge: Family Type of Home: House Home Access: Stairs to enter Secretary/administrator of Steps: 2 Entrance Stairs-Rails: Left Home Layout: One level Bathroom Shower/Tub: Tub/shower unit;Curtain Bathroom Toilet: Standard Bathroom Accessibility: Yes How Accessible: Accessible via walker Home Adaptive Equipment: Bedside commode/3-in-1;Tub transfer bench;Walker - rolling Additional Comments: pt has multiple injurious falls, most recent in Nov 2013, Oct 2012.  10/12 fall resulting in knee fracture and SNF admission Prior Function Level of Independence: Needs assistance Needs Assistance: Light Housekeeping Light Housekeeping: Moderate Able to Take Stairs?: Yes Driving: Yes Vocation: Retired Musician: No difficulties Dominant Hand: Right         Vision/Perception Vision - Assessment Vision Assessment: Vision not tested   Cognition  Overall Cognitive Status: Appears within functional limits for tasks assessed/performed Arousal/Alertness: Awake/alert Orientation Level: Oriented X4 / Intact Behavior During Session: WFL for tasks performed Cognition - Other Comments: intact    Extremity/Trunk Assessment Right Upper Extremity Assessment RUE ROM/Strength/Tone: Within functional levels RUE Sensation: WFL - Light Touch RUE Coordination: WFL - gross/fine motor Left Upper Extremity Assessment LUE ROM/Strength/Tone: Within functional levels LUE Sensation: WFL - Light Touch LUE Coordination: WFL - gross/fine motor Trunk Assessment Trunk Assessment: Kyphotic     Mobility Bed Mobility Bed Mobility: Supine to Sit;Sitting - Scoot to Edge of Bed Supine to Sit: 3: Mod assist;HOB elevated;With rails Sitting - Scoot to Edge of Bed:  3: Mod assist;With rail Details for Bed Mobility Assistance: assist to left leg to left EOB, used bed pad to advance hips and minimize pain, cues for best hand placement to come to sitting at EOB Transfers Transfers: Sit to Stand;Stand to Sit Sit to Stand: 4: Min assist;From bed;With armrests Stand to Sit: 4: Min assist;With armrests;To toilet Details for Transfer Assistance: cues for safest hand placement to/from RW and with arm rests. physical assist and cues to protect and minimize pain to left hip into sitting,      Shoulder Instructions     Exercise     Balance Balance Balance Assessed: No   End of Session OT - End of Session Activity Tolerance: Patient tolerated treatment well Patient left: in chair;with call bell/phone within reach;with nursing in room Nurse Communication: Mobility status  GO     Hope Budds 05/29/2012, 9:36 AM [phone]

## 2012-05-29 NOTE — Progress Notes (Signed)
UR COMPLETED  

## 2012-05-29 NOTE — Progress Notes (Signed)
PROGRESS NOTE  [name] MVH:[id number] DOB: 07-11-1923 DOA: 05/26/2012 PCP: Danise Edge, MD  Brief narrative: 79 yr old CF admitted 05/26/12 with mechanical fall and L intertochanteric hip #  Past medical history-As per Problem list Chart reviewed  In progress note dated 1.11.14  Consultants:  Orthopedics, Dr. Dion Saucier  Rehabilitation medicine  Procedures:  Chest x-ray 1 view 05/26/2012= no acute abnormalities, interstitial lung disease  Left hip x-ray complete 05/26/2012= cortical destruction greater trochanter  Left femur x-ray 05/26/2012 = no distal left femoral fracture  CT scan left hip 05/26/2012=Intertrochanteric fracture of the proximal left femur. Comminution of the greater trochanter.   Blood transfusion 2 units packed red blood cells 05/29/12  Antibiotics:  cipro 400 1.10   Subjective  Doing better, no specific concerns-about to have lunch. No nausea no vomiting no chest pain at present His pain seems to be much better    Objective    Interim History: Currently being transfused 2 units Therapy recommended CIR versus  Telemetry: Have placed on telemetry-Still not on  Objective: Filed Vitals:   05/29/12 0626 05/29/12 1026 05/29/12 1106 05/29/12 1135  BP: 121/47 98/44 129/52   Pulse: 80 75 72   Temp: 98.1 F (36.7 C) 98.6 F (37 C) 98.2 F (36.8 C)   TempSrc: Axillary Oral Oral   Resp: 16 16 16    Height:      Weight:      SpO2: 97% 95% 97% 97%    Intake/Output Summary (Last 24 hours) at 05/29/12 1208 Last data filed at 05/29/12 0100  Gross per 24 hour  Intake  752.5 ml  Output      0 ml  Net  752.5 ml    Exam:  General: Alert pleasant frail oriented Caucasian female Cardiovascular: S1-S2 no murmur rub or gallop Respiratory: Clinically clear with no added sound Abdomen: Soft nontender nondistended Skin left lower extremity has 2 bandages, upper one slightly soaked through.  ROM intact Neuro grossly intact  Data  Reviewed: Basic Metabolic Panel:  Lab 05/29/12 8413 05/28/12 0930 05/28/12 0505 05/27/12 2141 05/27/12 0705 05/26/12 1704  NA 138 136 134* -- 137 141  K 4.1 4.9 -- -- -- --  CL 104 101 100 -- 103 105  CO2 25 23 25  -- 24 24  GLUCOSE 135* 148* 120* -- 106* 139*  BUN 17 16 14  -- 17 20  CREATININE 1.28* 1.29* 1.26* 1.15* 1.32* --  CALCIUM 8.5 8.5 8.3* -- 8.6 8.9  MG -- -- -- -- -- --  PHOS -- -- -- -- -- --   Liver Function Tests: No results found for this basename: AST:5,ALT:5,ALKPHOS:5,BILITOT:5,PROT:5,ALBUMIN:5 in the last 168 hours No results found for this basename: LIPASE:5,AMYLASE:5 in the last 168 hours No results found for this basename: AMMONIA:5 in the last 168 hours CBC:  Lab 05/29/12 0630 05/28/12 0505 05/27/12 2141 05/27/12 0705 05/26/12 1704  WBC 9.2 8.7 10.9* 8.7 9.8  NEUTROABS -- -- -- -- 8.1*  HGB 7.2* 8.3* 8.6* 8.2* 9.4*  HCT 22.2* 26.3* 26.9* 26.3* 29.2*  MCV 97.4 98.1 97.5 101.2* 99.7  PLT 84* 91* 100* 123* 132*   Cardiac Enzymes: No results found for this basename: CKTOTAL:5,CKMB:5,CKMBINDEX:5,TROPONINI:5 in the last 168 hours BNP: No components found with this basename: POCBNP:5 CBG:  Lab 05/29/12 1201 05/29/12 0701 05/28/12 2205 05/28/12 1611 05/28/12 1142  GLUCAP 133* 134* 163* 116* 145*    Recent Results (from the past 240 hour(s))  URINE CULTURE     Status: Normal  Collection Time   05/24/12  2:44 PM      Component Value Range Status Comment   Colony Count NO GROWTH   Final    Organism ID, Bacteria NO GROWTH   Final   SURGICAL PCR SCREEN     Status: Normal   Collection Time   05/27/12  2:44 AM      Component Value Range Status Comment   MRSA, PCR NEGATIVE  NEGATIVE Final    Staphylococcus aureus NEGATIVE  NEGATIVE Final      Studies:              All Imaging reviewed and is as per above notation   Scheduled Meds:    . aspirin  81 mg Oral Daily  . atorvastatin  40 mg Oral q1800  . docusate sodium  100 mg Oral BID  . enoxaparin  (LOVENOX) injection  30 mg Subcutaneous Q24H  . insulin aspart  0-9 Units Subcutaneous TID WC  . levothyroxine  112 mcg Oral QAC breakfast  . mometasone-formoterol  2 puff Inhalation BID  . montelukast  10 mg Oral Daily  .  morphine injection  4 mg Intravenous Once  . PARoxetine  10 mg Oral Daily  . senna  1 tablet Oral BID  . warfarin  5 mg Oral ONCE-1800  . Warfarin - Pharmacist Dosing Inpatient   Does not apply q1800   Continuous Infusions:    . sodium chloride Stopped (05/28/12 1740)     Assessment/Plan: 1. Left intertrochanteric hip fracture, s/p IM nail1/11-  Continue Incentive spirometry.  Pain management-meperidine discontinued given high risk and poor benefit, DVT prophylaxis per orthopedic surgeon is Coumadin dosed at 5 mg daily-appreciate input 2. Mild hypotension-possibly a result of surgery/opiates. Monitor-continue saline c/out potassium 3. Hyperkalemia-Saline with supplemental K d/c'd 05/28/12.  Rpt Bmet ordered for 1.13.14 was stable 4. ? Pyelonephritis -this was a catheterized specimen-no growth-d/c abx 5. Extensive CAD history-[see above]-continue perioperative aspirin 81 mg-apparently not on beta blocker or ACE inhibitor.  Probably 2/2 to #4 6. DM-CBG changed to a.c./at bedtime-CBG's 134-141.  Continue Mod SSI coverage-takes metformin at home 7. Bradycardia necessitating pacemaker placed 6/09-stable at present 8. H/o Myelodysplasia-stabvle-rpt CBC am.  Venita Sheffield chronically as out-patient-she was transfused due to a hemoglobin of 7.2 on 05/29/12 9. H/o asymptomatic ICA stenosis r side with Aneurysm L side-needs out-patient follow-up-used to be on Plavix for this.  Not is not for unclear reason. 10. Htn-hold Amlodipine 5 mg daily given borderline low pressures.  Likely 2/2 to IV pain meds  Keep NS 75 cc/hr till surgery 11. Depression-continue Paxil 10mg  daily 12. Hld-Continue Lipitor 40 mg daily  Code Status: Full Family Communication: None at bedside-informed  that I am available to the patient felt as if needed Disposition Plan: Per Orthopedics-PT/Ot to ambulate patient today   Pleas Koch, MD  [facility] Pager [phone] 05/29/2012, 12:08 PM    LOS: 3 days

## 2012-05-29 NOTE — Progress Notes (Signed)
Rehab admissions - Evaluated for possible admission.  I spoke with patient.  She asked me to call her daughter.  I spoke with daughter who would like inpatient rehab admission.  I will contact [employer] to begin precert process for possible inpatient rehab admission.  Daughter's second choice if patient cannot have inpatient rehab would be [facility].  Call me for questions.  #[phone]

## 2012-05-29 NOTE — Consult Note (Signed)
Physical Medicine and Rehabilitation Consult Reason for Consult: Left intertrochanteric hip fracture Referring Physician: [facility]   HPI: [name] is a 79 y.o. right-handed female with history of hypertension, chronic kidney insufficiency with baseline creatinine 1.4 and diabetes mellitus with peripheral neuropathy. Patient with history of multiple falls most recently October 2012 resulting in knee fracture and skilled nursing facility admission. Admitted 05/26/2012 after a fall while patient was at the hair salon and she stood up starting to walk when she became dizzy. She denied any loss of consciousness. X-rays and imaging revealed left intertrochanteric hip fracture. Underwent intramedullary nail 05/27/2012 per Dr. Dion Saucier. Advised weightbearing as tolerated. Postoperative pain management. Placed on Coumadin for DVT prophylaxis and subcutaneous Lovenox the INR greater than 2.00. Physical therapy evaluation completed 05/28/2012 with recommendations of physical medicine rehabilitation consult to consider inpatient rehabilitation services.   Review of Systems  Genitourinary:       Urinary incontinence  Musculoskeletal: Positive for myalgias and falls.  Neurological: Positive for headaches.  Psychiatric/Behavioral: Positive for depression. The patient has insomnia.   All other systems reviewed and are negative.   Past Medical History  Diagnosis Date  . CAD (coronary artery disease)     a. 10/2007 PCI Diag w/ 2.25 x 8mm Taxus Atom DES.;  b. 09/2011 Lexi MV: EF 69%, no ischemia/infarct.  Marland Kitchen PVD (peripheral vascular disease)   . Carotid artery stenosis   . HTN (hypertension)   . Hyperlipidemia   . Hypothyroidism   . CKD (chronic kidney disease), stage III   . Diabetes mellitus 07/16/2011  . Allergic state 07/16/2011  . Insomnia 07/16/2011  . Recurrent UTI 07/16/2011  . Asthma     childhood, recurrent at 69  . Myelodysplastic syndrome 04/02/2011  . Sinusitis 07/18/2011  . Anemia 07/18/2011  .  Arthritis 07/18/2011  . Cataract extraction status of right eye 08/18/2011  . Anxiety and depression 08/18/2011  . Bronchitis, acute 12/20/2011  . Headache 03/02/2012  . AV heart block     a. 10/2007 MDT Versa dual chamber PPM.  . Bladder cancer 12/2002  . Closed intertrochanteric fracture of left hip 05/27/2012   Past Surgical History  Procedure Date  . Cholecystectomy   . Pcm - medtronic   . Abdominal hysterectomy   . Shoulder open rotator cuff repair   . Foot surgery     bunionectomy   Family History  Problem Relation Age of Onset  . Heart disease Mother     chf  . Arthritis Mother   . Arthritis Father   . Kidney disease Father   . Hypertension Father   . Cancer Brother     lung  . Arthritis Daughter     MVA with very serious injuries   Social History:  reports that she has never smoked. She has never used smokeless tobacco. She reports that she does not drink alcohol or use illicit drugs. Allergies:  Allergies  Allergen Reactions  . Codeine Anaphylaxis and Swelling  . Penicillins Anaphylaxis and Swelling  . Sulfonamide Derivatives Anaphylaxis and Swelling   Medications Prior to Admission  Medication Sig Dispense Refill  . acetaminophen (TYLENOL) 500 MG tablet Take 500 mg by mouth daily as needed. For pain      . amLODipine (NORVASC) 5 MG tablet Take 5 mg by mouth daily.      Marland Kitchen aspirin 81 MG tablet Take 81 mg by mouth daily.        . cefdinir (OMNICEF) 300 MG capsule Take 1 capsule (300  mg total) by mouth 2 (two) times daily.  14 capsule  0  . levothyroxine (SYNTHROID, LEVOTHROID) 112 MCG tablet Take 112 mcg by mouth daily.      . metFORMIN (GLUMETZA) 1000 MG (MOD) 24 hr tablet Take 1 tablet (1,000 mg total) by mouth 2 (two) times daily with a meal.  60 tablet  4  . Mometasone Furo-Formoterol Fum (DULERA) 200-5 MCG/ACT AERO Inhale 2 puffs into the lungs 3 (three) times daily as needed. For shortness of breath      . montelukast (SINGULAIR) 10 MG tablet Take 10 mg by mouth  daily.       . nitroGLYCERIN (NITROSTAT) 0.4 MG SL tablet Place 1 tablet (0.4 mg total) under the tongue every 5 (five) minutes x 3 doses as needed for chest pain.  25 tablet  3  . PARoxetine (PAXIL) 10 MG tablet Take 10 mg by mouth every morning.      . rosuvastatin (CRESTOR) 20 MG tablet Take 20 mg by mouth at bedtime.         Home: Home Living Lives With: Alone Available Help at Discharge: Family Type of Home: House Home Access: Stairs to enter [employer] of Steps: 2 (up to porch, the threshold to enter home) Entrance Stairs-Rails: Left Home Layout: One level Home Adaptive Equipment: Bedside commode/3-in-1;Tub transfer bench;Walker - rolling Additional Comments: pt has multiple injurious falls, most recent in Nov 2013, Oct 2012.  10/12 fall resulting in knee fracture and SNF admission  Functional History: Prior Function Light Housekeeping: Moderate Able to Take Stairs?: Yes Driving: Yes Comments: Like to drive to post office and go shopping at [employer] Functional Status:  Mobility: Bed Mobility Bed Mobility: Supine to Sit Supine to Sit: 3: Mod assist;HOB elevated;With rails Transfers Transfers: Sit to Stand;Stand to Sit Sit to Stand: 3: Mod assist;From bed;With armrests;With upper extremity assist;From chair/3-in-1 Stand to Sit: 3: Mod assist;With upper extremity assist;To chair/3-in-1;With armrests Ambulation/Gait Ambulation/Gait Assistance: Not tested (comment) (pivotal steps to chair with cues for sequencing with RW) Stairs: No Wheelchair Mobility Wheelchair Mobility: No  ADL:    Cognition: Cognition Arousal/Alertness: Awake/alert Orientation Level: Oriented to person;Oriented to place;Oriented to situation Cognition Overall Cognitive Status: Appears within functional limits for tasks assessed/performed Arousal/Alertness: Awake/alert Orientation Level: Appears intact for tasks assessed Behavior During Session: [facility] for tasks performed  Blood pressure  121/47, pulse 80, temperature 98.1 F (36.7 C), temperature source Axillary, resp. rate 16, height 5' (1.524 m), weight 47.5 kg (104 lb 11.5 oz), SpO2 97.00%. Physical Exam  Vitals reviewed. Constitutional: She is oriented to person, place, and time.  HENT:  Head: Normocephalic.  Eyes:       Pupils round and reactive to light  Neck: Normal range of motion. Neck supple. No thyromegaly present.  Cardiovascular: Normal rate and regular rhythm.   Pulmonary/Chest: Effort normal and breath sounds normal. No respiratory distress.  Abdominal: Soft. Bowel sounds are normal. She exhibits no distension.  Musculoskeletal: She exhibits no edema.       Left hip appropriately tender. Dressed.  Neurological: She is alert and oriented to person, place, and time.       Follows full commands. fxnl UE strength. No sensory deficits except for mild LT and PP loss in the feet. Right LE 3/5 prox to 4/5 distally .could not lift left leg agst gravity.    Skin:       Left hip incision is dressed  Psychiatric: She has a normal mood and affect. Her behavior is  normal. Judgment and thought content normal.    Results for orders placed during the hospital encounter of 05/26/12 (from the past 24 hour(s))  GLUCOSE, CAPILLARY     Status: Abnormal   Collection Time   05/28/12  8:19 AM      Component Value Range   Glucose-Capillary 147 (*) 70 - 99 mg/dL  BASIC METABOLIC PANEL     Status: Abnormal   Collection Time   05/28/12  9:30 AM      Component Value Range   Sodium 136  135 - 145 mEq/L   Potassium 4.9  3.5 - 5.1 mEq/L   Chloride 101  96 - 112 mEq/L   CO2 23  19 - 32 mEq/L   Glucose, Bld 148 (*) 70 - 99 mg/dL   BUN 16  6 - 23 mg/dL   Creatinine, Ser 6.29 (*) 0.50 - 1.10 mg/dL   Calcium 8.5  8.4 - 52.8 mg/dL   GFR calc non Af Amer 36 (*) >90 mL/min   GFR calc Af Amer 42 (*) >90 mL/min  GLUCOSE, CAPILLARY     Status: Abnormal   Collection Time   05/28/12 11:42 AM      Component Value Range    Glucose-Capillary 145 (*) 70 - 99 mg/dL  GLUCOSE, CAPILLARY     Status: Abnormal   Collection Time   05/28/12  4:11 PM      Component Value Range   Glucose-Capillary 116 (*) 70 - 99 mg/dL  GLUCOSE, CAPILLARY     Status: Abnormal   Collection Time   05/28/12 10:05 PM      Component Value Range   Glucose-Capillary 163 (*) 70 - 99 mg/dL  CBC     Status: Abnormal   Collection Time   05/29/12  6:30 AM      Component Value Range   WBC 9.2  4.0 - 10.5 K/uL   RBC 2.28 (*) 3.87 - 5.11 MIL/uL   Hemoglobin 7.2 (*) 12.0 - 15.0 g/dL   HCT 41.3 (*) 24.4 - 01.0 %   MCV 97.4  78.0 - 100.0 fL   MCH 31.6  26.0 - 34.0 pg   MCHC 32.4  30.0 - 36.0 g/dL   RDW 27.2 (*) 53.6 - 64.4 %   Platelets 84 (*) 150 - 400 K/uL  BASIC METABOLIC PANEL     Status: Abnormal   Collection Time   05/29/12  6:30 AM      Component Value Range   Sodium 138  135 - 145 mEq/L   Potassium 4.1  3.5 - 5.1 mEq/L   Chloride 104  96 - 112 mEq/L   CO2 25  19 - 32 mEq/L   Glucose, Bld 135 (*) 70 - 99 mg/dL   BUN 17  6 - 23 mg/dL   Creatinine, Ser 0.34 (*) 0.50 - 1.10 mg/dL   Calcium 8.5  8.4 - 74.2 mg/dL   GFR calc non Af Amer 37 (*) >90 mL/min   GFR calc Af Amer 43 (*) >90 mL/min  PROTIME-INR     Status: Abnormal   Collection Time   05/29/12  6:30 AM      Component Value Range   Prothrombin Time 16.2 (*) 11.6 - 15.2 seconds   INR 1.33  0.00 - 1.49  GLUCOSE, CAPILLARY     Status: Abnormal   Collection Time   05/29/12  7:01 AM      Component Value Range   Glucose-Capillary 134 (*) 70 - 99 mg/dL  Dg Femur Left  05/27/2012  *RADIOLOGY REPORT*  Clinical Data: Fracture fixation.  DG C-ARM 1-60 MIN,LEFT FEMUR - 2 VIEW  Comparison:  Radiographs 05/26/2012  Findings: Fluoroscopic spot films demonstrate placement of a long intramedullary rod and dynamic hip screw transfixing the fracture. Anatomic alignment.  IMPRESSION: Internal fixation of intertrochanteric fracture left hip.   Original Report Authenticated By: Rudie Meyer, M.D.      Dg Pelvis Portable  05/27/2012  *RADIOLOGY REPORT*  Clinical Data: 79 year old female status post ORIF left femur.  PORTABLE PELVIS, PORTABLE LEFT HIP - 1 VIEW  Comparison: Intraoperative radiographs 1640 hours the same day and earlier.  Findings: Portable pelvis:  AP portable supine views of the pelvis.  Left femoral intramedullary rod with proximal interlocking dynamic hip screw traverse the intertrochanteric fracture.  Near anatomic alignment.  Postoperative changes to the surrounding soft tissues including subcutaneous gas.  Calcified atherosclerosis. Osteopenia.  Pelvis appears stable and intact.  Portable left hip:  Portable cross-table lateral view the left hip. Good alignment at the intertrochanteric fracture.  Left femoral intramedullary rod intact.  IMPRESSION: Left femoral arthroplasty with near anatomic alignment at the intertrochanteric fracture and no adverse features.   Original Report Authenticated By: Erskine Speed, M.D.    Dg Hip Portable 1 View Left  05/27/2012  *RADIOLOGY REPORT*  Clinical Data: 79 year old female status post ORIF left femur.  PORTABLE PELVIS, PORTABLE LEFT HIP - 1 VIEW  Comparison: Intraoperative radiographs 1640 hours the same day and earlier.  Findings: Portable pelvis:  AP portable supine views of the pelvis.  Left femoral intramedullary rod with proximal interlocking dynamic hip screw traverse the intertrochanteric fracture.  Near anatomic alignment.  Postoperative changes to the surrounding soft tissues including subcutaneous gas.  Calcified atherosclerosis. Osteopenia.  Pelvis appears stable and intact.  Portable left hip:  Portable cross-table lateral view the left hip. Good alignment at the intertrochanteric fracture.  Left femoral intramedullary rod intact.  IMPRESSION: Left femoral arthroplasty with near anatomic alignment at the intertrochanteric fracture and no adverse features.   Original Report Authenticated By: Erskine Speed, M.D.    Dg C-arm 1-60  Min  05/27/2012  *RADIOLOGY REPORT*  Clinical Data: Fracture fixation.  DG C-ARM 1-60 MIN,LEFT FEMUR - 2 VIEW  Comparison:  Radiographs 05/26/2012  Findings: Fluoroscopic spot films demonstrate placement of a long intramedullary rod and dynamic hip screw transfixing the fracture. Anatomic alignment.  IMPRESSION: Internal fixation of intertrochanteric fracture left hip.   Original Report Authenticated By: Rudie Meyer, M.D.     Assessment/Plan: Diagnosis: left IT hip fx, diabetic peripheral neuropathy 1. Does the need for close, 24 hr/day medical supervision in concert with the patient's rehab needs make it unreasonable for this patient to be served in a less intensive setting? Yes 2. Co-Morbidities requiring supervision/potential complications: CKD, DM, ABLA, avb, cas,pvd 3. Due to bladder management, bowel management, safety, skin/wound care, disease management, medication administration, pain management and patient education, does the patient require 24 hr/day rehab nursing? Yes 4. Does the patient require coordinated care of a physician, rehab nurse, PT (1-2 hrs/day, 5 days/week) and OT (1-2 hrs/day, 5 days/week) to address physical and functional deficits in the context of the above medical diagnosis(es)? Yes Addressing deficits in the following areas: balance, endurance, locomotion, strength, transferring, bowel/bladder control, bathing, dressing, feeding, grooming, toileting and psychosocial support 5. Can the patient actively participate in an intensive therapy program of at least 3 hrs of therapy per day at least 5 days per week? Yes 6.  The potential for patient to make measurable gains while on inpatient rehab is excellent 7. Anticipated functional outcomes upon discharge from inpatient rehab are supervision to mod I with PT, mod I to min assist with OT, n/a with SLP. 8. Estimated rehab length of stay to reach the above functional goals is: 2 weeks 9. Does the patient have adequate social  supports to accommodate these discharge functional goals? Yes 10. Anticipated D/C setting: Home 11. Anticipated post D/C treatments: HH therapy 12. Overall Rehab/Functional Prognosis: excellent  RECOMMENDATIONS: This patient's condition is appropriate for continued rehabilitative care in the following setting: CIR Patient has agreed to participate in recommended program. Yes Note that insurance prior authorization may be required for reimbursement for recommended care.  Comment:Rehab RN to follow up. Pt very motivated and ready to work. Generally active and independent PTA.   Ivory Broad, MD     05/29/2012

## 2012-05-29 NOTE — Progress Notes (Signed)
Patient ID: [name], female   DOB: Nov 10, 1923, 79 y.o.   MRN: [id number]     Subjective:  Patient reports pain as mild.  She denies any CP or SOB.  Objective:   VITALS:   Filed Vitals:   05/28/12 2030 05/29/12 0000 05/29/12 0227 05/29/12 0626  BP: 132/56   121/47  Pulse: 87   80  Temp: 99.4 F (37.4 C)   98.1 F (36.7 C)  TempSrc: Oral   Axillary  Resp: 16 18  16   Height:   5' (1.524 m)   Weight:   47.5 kg (104 lb 11.5 oz)   SpO2: 100% 99%  97%    ABD soft Sensation intact distally Dorsiflexion/Plantar flexion intact Incision: dressing C/D/I and no drainage   Lab Results  Component Value Date   WBC 9.2 05/29/2012   HGB 7.2* 05/29/2012   HCT 22.2* 05/29/2012   MCV 97.4 05/29/2012   PLT 84* 05/29/2012     Assessment/Plan: 2 Days Post-Op   Principal Problem:  *Closed intertrochanteric fracture of left hip Active Problems:  HYPERLIPIDEMIA-MIXED  HYPERTENSION, UNSPECIFIED  HYPOTENSION, ORTHOSTATIC  Diabetes mellitus   Advance diet Up with therapy WBAT ABLA will plan to transfuse 2 units Continue plan per medicine   Haskel Khan 05/29/2012, 9:25 AM   [name] Lucy, MD Cell [phone] Pager [phone]

## 2012-05-29 NOTE — Progress Notes (Signed)
Spoke with patient and she is awaiting possible admission into CIR. Patient is agreeable to SNF if CIR does not work out. All clinicals completed. CSW will await determination by CIR. Sabino Niemann, MSW, [phone]

## 2012-05-29 NOTE — Progress Notes (Signed)
Rehab Admissions Coordinator Note:  Patient was screened by Clois Dupes for appropriateness for an Inpatient Acute Rehab Consult.  At this time, we are recommending Inpatient Rehab consult which is pending completion today.  Clois Dupes, RN 05/29/2012, 8:47 AM  I can be reached at [phone].

## 2012-05-29 NOTE — Progress Notes (Signed)
ANTICOAGULATION CONSULT NOTE - Follow Up Consult  Pharmacy Consult for coumadin Indication: VTE prophylaxis  Allergies  Allergen Reactions  . Codeine Anaphylaxis and Swelling  . Penicillins Anaphylaxis and Swelling  . Sulfonamide Derivatives Anaphylaxis and Swelling   Labs:  Basename 05/29/12 0630 05/28/12 0930 05/28/12 0505 05/27/12 2141  HGB 7.2* -- 8.3* --  HCT 22.2* -- 26.3* 26.9*  PLT 84* -- 91* 100*  APTT -- -- -- --  LABPROT 16.2* -- 14.4 14.4  INR 1.33 -- 1.14 1.14  HEPARINUNFRC -- -- -- --  CREATININE 1.28* 1.29* 1.26* --  CKTOTAL -- -- -- --  CKMB -- -- -- --  TROPONINI -- -- -- --    Estimated Creatinine Clearance: 22.7 ml/min (by C-G formula based on Cr of 1.28).  Assessment: 79 yo female s/p ortho surgery is currently on subtherapeutic coumadin.  INR today was 1.33  Goal of Therapy:  INR 2-3  Plan:  1) Coumadin 5mg  po x1 2) INR in am  Thank you. Okey Regal, PharmD [phone]  05/29/2012,10:43 AM

## 2012-05-29 NOTE — Progress Notes (Signed)
CM following. Patient may go to SNF.Waiting for PT notes.   Status of service:  In process, will continue to follow

## 2012-05-30 LAB — GLUCOSE, CAPILLARY: Glucose-Capillary: 147 mg/dL — ABNORMAL HIGH (ref 70–99)

## 2012-05-30 LAB — TYPE AND SCREEN
ABO/RH(D): O POS
Antibody Screen: NEGATIVE
Unit division: 0

## 2012-05-30 LAB — CBC
HCT: 32.9 % — ABNORMAL LOW (ref 36.0–46.0)
MCHC: 32.8 g/dL (ref 30.0–36.0)
MCV: 92.2 fL (ref 78.0–100.0)
Platelets: 89 10*3/uL — ABNORMAL LOW (ref 150–400)
RDW: 18.4 % — ABNORMAL HIGH (ref 11.5–15.5)

## 2012-05-30 LAB — URINALYSIS, ROUTINE W REFLEX MICROSCOPIC
Bilirubin Urine: NEGATIVE
Ketones, ur: NEGATIVE mg/dL
Nitrite: NEGATIVE
Protein, ur: 100 mg/dL — AB
Urobilinogen, UA: 0.2 mg/dL (ref 0.0–1.0)

## 2012-05-30 LAB — URINE MICROSCOPIC-ADD ON

## 2012-05-30 LAB — PROTIME-INR: INR: 3.78 — ABNORMAL HIGH (ref 0.00–1.49)

## 2012-05-30 MED ORDER — AMLODIPINE BESYLATE 5 MG PO TABS
5.0000 mg | ORAL_TABLET | Freq: Every day | ORAL | Status: DC
  Administered 2012-05-30 – 2012-05-31 (×2): 5 mg via ORAL
  Filled 2012-05-30 (×2): qty 1

## 2012-05-30 MED ORDER — CIPROFLOXACIN IN D5W 400 MG/200ML IV SOLN
400.0000 mg | Freq: Two times a day (BID) | INTRAVENOUS | Status: DC
  Administered 2012-05-30 – 2012-05-31 (×2): 400 mg via INTRAVENOUS
  Filled 2012-05-30 (×3): qty 200

## 2012-05-30 NOTE — Progress Notes (Signed)
ANTICOAGULATION CONSULT NOTE - Follow Up Consult  Pharmacy Consult for coumadin Indication: VTE prophylaxis  Allergies  Allergen Reactions  . Codeine Anaphylaxis and Swelling  . Penicillins Anaphylaxis and Swelling  . Sulfonamide Derivatives Anaphylaxis and Swelling   Labs:  Basename 05/30/12 0530 05/29/12 2137 05/29/12 0630 05/28/12 0930 05/28/12 0505  HGB 10.8* 10.8* -- -- --  HCT 32.9* 32.1* 22.2* -- --  PLT 89* 85* 84* -- --  APTT -- -- -- -- --  LABPROT 35.1* -- 16.2* -- 14.4  INR 3.78* -- 1.33 -- 1.14  HEPARINUNFRC -- -- -- -- --  CREATININE -- -- 1.28* 1.29* 1.26*  CKTOTAL -- -- -- -- --  CKMB -- -- -- -- --  TROPONINI -- -- -- -- --    Estimated Creatinine Clearance: 22.7 ml/min (by C-G formula based on Cr of 1.28).  Assessment: 79 yo female s/p ortho surgery is currently on subtherapeutic coumadin.  INR today is 3.78  Goal of Therapy:  INR 2-3  Plan:  1) No Coumadin today, Lovenox discontinued 2) INR in am  Thank you. Okey Regal, PharmD [phone]  05/30/2012,11:47 AM

## 2012-05-30 NOTE — Progress Notes (Signed)
Physical Therapy Treatment Patient Details Name: [name] MRN: [id number] DOB: 1923/09/08 Today's Date: 05/30/2012 Time: 4098-1191 PT Time Calculation (min): 32 min  PT Assessment / Plan / Recommendation Comments on Treatment Session  pt presents with L femur fx s/p IM nail.  pt very painful today and notes she is not sure when she had her last pain meds.  RN made aware.  pt seems very motivated and anticipate better mobility once pain better controlled.      Follow Up Recommendations  CIR     Does the patient have the potential to tolerate intense rehabilitation     Barriers to Discharge        Equipment Recommendations  None recommended by PT    Recommendations for Other Services    Frequency Min 3X/week   Plan Discharge plan remains appropriate;Frequency remains appropriate    Precautions / Restrictions Precautions Precautions: Fall Restrictions Weight Bearing Restrictions: Yes LLE Weight Bearing: Weight bearing as tolerated   Pertinent Vitals/Pain Indicates very painful.  RN made aware and in room at end of PT.      Mobility  Bed Mobility Bed Mobility: Supine to Sit;Sitting - Scoot to Edge of Bed Supine to Sit: 3: Mod assist;HOB elevated;With rails Sitting - Scoot to Edge of Bed: 3: Mod assist Details for Bed Mobility Assistance: pt able to use R LE to help guide L LE, but pt very painful today.   Transfers Transfers: Sit to Stand;Stand to Sit Sit to Stand: 4: Min assist;With upper extremity assist;From bed;From toilet Stand to Sit: 4: Min assist;With upper extremity assist;To toilet;To chair/3-in-1;With armrests Details for Transfer Assistance: cues for use of UEs, positioning of LEs, anterior wt shift.   Ambulation/Gait Ambulation/Gait Assistance: 4: Min assist Ambulation Distance (Feet): 15 Feet (x2) Assistive device: Rolling walker Ambulation/Gait Assistance Details: cues for positioning in RW, upright posture, gait sequencing.   Gait Pattern: Step-to  pattern;Decreased step length - right;Decreased stance time - left;Trunk flexed Stairs: No Wheelchair Mobility Wheelchair Mobility: No    Exercises     PT Diagnosis:    PT Problem List:   PT Treatment Interventions:     PT Goals Acute Rehab PT Goals Time For Goal Achievement: 06/11/12 Potential to Achieve Goals: Good PT Goal: Supine/Side to Sit - Progress: Progressing toward goal PT Goal: Sit to Stand - Progress: Progressing toward goal PT Goal: Stand to Sit - Progress: Progressing toward goal PT Goal: Ambulate - Progress: Progressing toward goal  Visit Information  Last PT Received On: 05/30/12 Assistance Needed: +1    Subjective Data  Subjective: I rang ago to go to the bathroom and I'm afraid I've had an accident.     Cognition  Overall Cognitive Status: Appears within functional limits for tasks assessed/performed Arousal/Alertness: Awake/alert Orientation Level: Oriented X4 / Intact Behavior During Session: WFL for tasks performed    Balance  Balance Balance Assessed: Yes Static Standing Balance Static Standing - Balance Support: Bilateral upper extremity supported;During functional activity Static Standing - Level of Assistance: 5: Stand by assistance Static Standing - Comment/# of Minutes: pt able to stand with S holding RW while PT performed peri hygiene.    End of Session PT - End of Session Equipment Utilized During Treatment: Gait belt Activity Tolerance: Patient limited by pain Patient left: in chair;with call bell/phone within reach;with nursing in room Nurse Communication: Mobility status;Patient requests pain meds   GP     Sunny Schlein, [place] [phone] 05/30/2012, 11:22 AM

## 2012-05-30 NOTE — Progress Notes (Signed)
Rehab admissions - I have not heard back from [facility] case manager.  I did call and leave a message.  I continue to await a decision regarding inpatient rehab admission.  #[phone]

## 2012-05-30 NOTE — Progress Notes (Signed)
Rehab admissions - I now have approval for inpatient rehab admission from [facility].  Currently rehab is full.  I will follow up in am for bed availability and possible admission to inpatient rehab.  #[phone]

## 2012-05-30 NOTE — Progress Notes (Signed)
PROGRESS NOTE  [name] ZOX:[id number] DOB: 11-18-1923 DOA: 05/26/2012 PCP: Danise Edge, MD  Brief narrative: 79 yr old CF admitted 05/26/12 with mechanical fall and L intertochanteric hip #.  She underwent op repair of this 1/11,.  Her pos-t uop course was significant for the fact that's devleoped a fever and malaise in the setting of a recent UTI's who she is scheudled to see Urologist Dr. Isabel Caprice for. Patient is CIR eligible and awaiting Insurance authorization-if not she is amenable to SNF placement  Past medical history-As per Problem list Chart reviewed  In progress note dated 1.11.14  Consultants:  Orthopedics, Dr. Dion Saucier  Rehabilitation medicine  Procedures:  Chest x-ray 1 view 05/26/2012= no acute abnormalities, interstitial lung disease  Left hip x-ray complete 05/26/2012= cortical destruction greater trochanter  Left femur x-ray 05/26/2012 = no distal left femoral fracture  CT scan left hip 05/26/2012=Intertrochanteric fracture of the proximal left femur. Comminution of the greater trochanter.   Blood transfusion 2 units packed red blood cells 05/29/12  Antibiotics:  cipro 400 1.10   Subjective  Had some malaise and was up all night.  Didn't;t feel warm but noted temp by RN overnight No current n/v/cp Has had some chronic dysuria for the past couple of weeks   Objective    Interim History: Therapy recommended CIR versusSNF  Telemetry:   Objective: Filed Vitals:   05/29/12 2128 05/29/12 2134 05/30/12 0524 05/30/12 0918  BP: 166/63  135/58   Pulse: 88 85 74   Temp: 101.2 F (38.4 C)  98.6 F (37 C)   TempSrc:      Resp: 16 18 16    Height:      Weight:      SpO2: 93% 94% 96% 95%    Intake/Output Summary (Last 24 hours) at 05/30/12 1308 Last data filed at 05/30/12 0848  Gross per 24 hour  Intake    240 ml  Output      0 ml  Net    240 ml    Exam:  General: Alert pleasant frail oriented Caucasian female Cardiovascular: S1-S2 no  murmur rub or gallop Respiratory: Clinically clear with no added sound Abdomen: Soft nontender nondistended Skin left lower extremity has 2 bandages, upper one slightly soaked through.  ROM intact Neuro grossly intact  Data Reviewed: Basic Metabolic Panel:  Lab 05/29/12 8119 05/28/12 0930 05/28/12 0505 05/27/12 2141 05/27/12 0705 05/26/12 1704  NA 138 136 134* -- 137 141  K 4.1 4.9 -- -- -- --  CL 104 101 100 -- 103 105  CO2 25 23 25  -- 24 24  GLUCOSE 135* 148* 120* -- 106* 139*  BUN 17 16 14  -- 17 20  CREATININE 1.28* 1.29* 1.26* 1.15* 1.32* --  CALCIUM 8.5 8.5 8.3* -- 8.6 8.9  MG -- -- -- -- -- --  PHOS -- -- -- -- -- --   Liver Function Tests: No results found for this basename: AST:5,ALT:5,ALKPHOS:5,BILITOT:5,PROT:5,ALBUMIN:5 in the last 168 hours No results found for this basename: LIPASE:5,AMYLASE:5 in the last 168 hours No results found for this basename: AMMONIA:5 in the last 168 hours CBC:  Lab 05/30/12 0530 05/29/12 2137 05/29/12 0630 05/28/12 0505 05/27/12 2141 05/26/12 1704  WBC 11.8* 12.2* 9.2 8.7 10.9* --  NEUTROABS -- 9.1* -- -- -- 8.1*  HGB 10.8* 10.8* 7.2* 8.3* 8.6* --  HCT 32.9* 32.1* 22.2* 26.3* 26.9* --  MCV 92.2 90.9 97.4 98.1 97.5 --  PLT 89* 85* 84* 91* 100* --  Cardiac Enzymes: No results found for this basename: CKTOTAL:5,CKMB:5,CKMBINDEX:5,TROPONINI:5 in the last 168 hours BNP: No components found with this basename: POCBNP:5 CBG:  Lab 05/30/12 1205 05/30/12 0644 05/29/12 2136 05/29/12 1639 05/29/12 1201  GLUCAP 136* 147* 152* 205* 133*    Recent Results (from the past 240 hour(s))  URINE CULTURE     Status: Normal   Collection Time   05/24/12  2:44 PM      Component Value Range Status Comment   Colony Count NO GROWTH   Final    Organism ID, Bacteria NO GROWTH   Final   SURGICAL PCR SCREEN     Status: Normal   Collection Time   05/27/12  2:44 AM      Component Value Range Status Comment   MRSA, PCR NEGATIVE  NEGATIVE Final     Staphylococcus aureus NEGATIVE  NEGATIVE Final      Studies:              All Imaging reviewed and is as per above notation   Scheduled Meds:    . aspirin  81 mg Oral Daily  . atorvastatin  40 mg Oral q1800  . docusate sodium  100 mg Oral BID  . insulin aspart  0-9 Units Subcutaneous TID WC  . levothyroxine  112 mcg Oral QAC breakfast  . mometasone-formoterol  2 puff Inhalation BID  . montelukast  10 mg Oral Daily  .  morphine injection  4 mg Intravenous Once  . PARoxetine  10 mg Oral Daily  . senna  1 tablet Oral BID  . Warfarin - Pharmacist Dosing Inpatient   Does not apply q1800   Continuous Infusions:    . sodium chloride Stopped (05/28/12 1740)     Assessment/Plan: 1. Left intertrochanteric hip fracture, s/p IM nail1/11-  Continue Incentive spirometry.  Pain management-meperidine discontinued given high risk and poor benefit, DVT= Coumadin dosed at 5 mg daily-appreciate input 2. Mild hypotension-possibly a result of surgery/opiates. Resolved 3. Hyperkalemia-Saline with supplemental K d/c'd 05/28/12.  Resolved 4. Pyelonephritis -was being Rx for past 3 weeks-developed fever 1/14-started Ciprofloxacin 400 IV bid.  3-5 days therapy should suffice.  followcult 5. Extensive CAD history-[see above]-continue perioperative aspirin 81 mg-apparently not on beta blocker or ACE inhibitor.  Probably 2/2 to #4 6. DM-CBG changed to a.c./at bedtime-CBG's 136-147.  Continue Mod SSI coverage-takes metformin at home 7. Bradycardia necessitating pacemaker placed 6/09-stable at present 8. H/o Myelodysplasia-stabvle-rpt CBC am.  Venita Sheffield chronically as out-patient-she was transfused due to a hemoglobin of 7.2 on 05/29/12-Rpt Hb 10.8 9. Chronic TCP-probably 2/2 to #8 10. H/o asymptomatic ICA stenosis r side with Aneurysm L side-needs out-patient follow-up-used to be on Plavix for this.  Not is not for unclear reason. 11. H.o Bladder cancer 2003-For Urology f/u c Dr. Isabel Caprice as  out-patient. 12. Htn-held Amlodipine 5 mg-restarted 1.14.  Likely 2/2 to IV pain meds  Keep NS 75 cc/hr till surgery 13. Depression-continue Paxil 10mg  daily 14. Hld-Continue Lipitor 40 mg daily  Code Status: Full Family Communication:Daughter at bedisde and fully updated Disposition Plan: Per Orthopedics-PT/Ot to ambulate patient today-potential SNF vs CIR in 1-2 d   Pleas Koch, MD  [facility] Pager [phone] 05/30/2012, 1:08 PM    LOS: 4 days

## 2012-05-30 NOTE — Progress Notes (Signed)
Patient ID: SHRESHTA MEDLEY, female   DOB: 08/30/1923, 79 y.o.   MRN: [id number]     Subjective:  Patient reports pain as mild to moderate.  Patient doing better and sitting up in the chair in no acute distress.   Objective:   VITALS:   Filed Vitals:   05/29/12 2128 05/29/12 2134 05/30/12 0524 05/30/12 0918  BP: 166/63  135/58   Pulse: 88 85 74   Temp: 101.2 F (38.4 C)  98.6 F (37 C)   TempSrc:      Resp: 16 18 16    Height:      Weight:      SpO2: 93% 94% 96% 95%    ABD soft Sensation intact distally Dorsiflexion/Plantar flexion intact Incision: dressing C/D/I and no drainage   Lab Results  Component Value Date   WBC 11.8* 05/30/2012   HGB 10.8* 05/30/2012   HCT 32.9* 05/30/2012   MCV 92.2 05/30/2012   PLT 89* 05/30/2012     Assessment/Plan: 3 Days Post-Op   Principal Problem:  *Closed intertrochanteric fracture of left hip Active Problems:  HYPERLIPIDEMIA-MIXED  HYPERTENSION, UNSPECIFIED  HYPOTENSION, ORTHOSTATIC  Diabetes mellitus   Advance diet Up with therapy Continue plan per medicine WBAT CIR planned when insurance authorization received, hopefully today.   Haskel Khan 05/30/2012, 1:22 PM   Teryl Lucy, MD Cell [phone] Pager [phone]

## 2012-05-31 ENCOUNTER — Encounter (HOSPITAL_COMMUNITY): Payer: Self-pay | Admitting: *Deleted

## 2012-05-31 ENCOUNTER — Inpatient Hospital Stay (HOSPITAL_COMMUNITY)
Admission: RE | Admit: 2012-05-31 | Discharge: 2012-06-10 | DRG: 945 | Disposition: A | Discharge status: Home Health | Payer: Medicare Other | Source: Intra-hospital | Attending: Physical Medicine & Rehabilitation | Admitting: Physical Medicine & Rehabilitation

## 2012-05-31 DIAGNOSIS — Z7982 Long term (current) use of aspirin: Secondary | ICD-10-CM

## 2012-05-31 DIAGNOSIS — K5900 Constipation, unspecified: Secondary | ICD-10-CM | POA: Diagnosis present

## 2012-05-31 DIAGNOSIS — R296 Repeated falls: Secondary | ICD-10-CM

## 2012-05-31 DIAGNOSIS — M129 Arthropathy, unspecified: Secondary | ICD-10-CM

## 2012-05-31 DIAGNOSIS — F3289 Other specified depressive episodes: Secondary | ICD-10-CM

## 2012-05-31 DIAGNOSIS — Z79899 Other long term (current) drug therapy: Secondary | ICD-10-CM | POA: Diagnosis not present

## 2012-05-31 DIAGNOSIS — E1149 Type 2 diabetes mellitus with other diabetic neurological complication: Secondary | ICD-10-CM | POA: Diagnosis present

## 2012-05-31 DIAGNOSIS — D649 Anemia, unspecified: Secondary | ICD-10-CM

## 2012-05-31 DIAGNOSIS — E785 Hyperlipidemia, unspecified: Secondary | ICD-10-CM | POA: Diagnosis present

## 2012-05-31 DIAGNOSIS — I2510 Atherosclerotic heart disease of native coronary artery without angina pectoris: Secondary | ICD-10-CM | POA: Diagnosis present

## 2012-05-31 DIAGNOSIS — N289 Disorder of kidney and ureter, unspecified: Secondary | ICD-10-CM

## 2012-05-31 DIAGNOSIS — X58XXXA Exposure to other specified factors, initial encounter: Secondary | ICD-10-CM | POA: Diagnosis present

## 2012-05-31 DIAGNOSIS — D469 Myelodysplastic syndrome, unspecified: Secondary | ICD-10-CM

## 2012-05-31 DIAGNOSIS — D62 Acute posthemorrhagic anemia: Secondary | ICD-10-CM

## 2012-05-31 DIAGNOSIS — N390 Urinary tract infection, site not specified: Secondary | ICD-10-CM | POA: Diagnosis present

## 2012-05-31 DIAGNOSIS — J449 Chronic obstructive pulmonary disease, unspecified: Secondary | ICD-10-CM

## 2012-05-31 DIAGNOSIS — S72143A Displaced intertrochanteric fracture of unspecified femur, initial encounter for closed fracture: Secondary | ICD-10-CM | POA: Diagnosis present

## 2012-05-31 DIAGNOSIS — F329 Major depressive disorder, single episode, unspecified: Secondary | ICD-10-CM

## 2012-05-31 DIAGNOSIS — C679 Malignant neoplasm of bladder, unspecified: Secondary | ICD-10-CM

## 2012-05-31 DIAGNOSIS — S72009A Fracture of unspecified part of neck of unspecified femur, initial encounter for closed fracture: Secondary | ICD-10-CM

## 2012-05-31 DIAGNOSIS — E1142 Type 2 diabetes mellitus with diabetic polyneuropathy: Secondary | ICD-10-CM | POA: Diagnosis present

## 2012-05-31 DIAGNOSIS — I129 Hypertensive chronic kidney disease with stage 1 through stage 4 chronic kidney disease, or unspecified chronic kidney disease: Secondary | ICD-10-CM | POA: Diagnosis present

## 2012-05-31 DIAGNOSIS — Z5189 Encounter for other specified aftercare: Principal | ICD-10-CM

## 2012-05-31 DIAGNOSIS — Z8551 Personal history of malignant neoplasm of bladder: Secondary | ICD-10-CM | POA: Diagnosis not present

## 2012-05-31 DIAGNOSIS — N183 Chronic kidney disease, stage 3 unspecified: Secondary | ICD-10-CM | POA: Diagnosis present

## 2012-05-31 DIAGNOSIS — F341 Dysthymic disorder: Secondary | ICD-10-CM

## 2012-05-31 DIAGNOSIS — E039 Hypothyroidism, unspecified: Secondary | ICD-10-CM | POA: Diagnosis present

## 2012-05-31 DIAGNOSIS — J4489 Other specified chronic obstructive pulmonary disease: Secondary | ICD-10-CM

## 2012-05-31 LAB — PROTIME-INR: INR: 4.32 — ABNORMAL HIGH (ref 0.00–1.49)

## 2012-05-31 LAB — GLUCOSE, CAPILLARY: Glucose-Capillary: 132 mg/dL — ABNORMAL HIGH (ref 70–99)

## 2012-05-31 MED ORDER — CIPROFLOXACIN HCL 500 MG PO TABS: 500.0000 mg | ORAL_TABLET | Freq: Two times a day (BID) | ORAL | Status: DC

## 2012-05-31 MED ORDER — SENNOSIDES-DOCUSATE SODIUM 8.6-50 MG PO TABS
2.0000 | ORAL_TABLET | Freq: Every day | ORAL | Status: DC
  Administered 2012-06-01: 2 via ORAL
  Filled 2012-05-31 (×5): qty 2

## 2012-05-31 MED ORDER — MONTELUKAST SODIUM 10 MG PO TABS
10.0000 mg | ORAL_TABLET | Freq: Every day | ORAL | Status: DC
  Administered 2012-06-01 – 2012-06-10 (×10): 10 mg via ORAL
  Filled 2012-05-31 (×13): qty 1

## 2012-05-31 MED ORDER — AMLODIPINE BESYLATE 5 MG PO TABS
5.0000 mg | ORAL_TABLET | Freq: Every day | ORAL | Status: DC
  Administered 2012-06-01 – 2012-06-10 (×10): 5 mg via ORAL
  Filled 2012-05-31 (×12): qty 1

## 2012-05-31 MED ORDER — INSULIN ASPART 100 UNIT/ML ~~LOC~~ SOLN
0.0000 [IU] | Freq: Three times a day (TID) | SUBCUTANEOUS | Status: DC
  Administered 2012-06-01: 1 [IU] via SUBCUTANEOUS
  Administered 2012-06-01: 3 [IU] via SUBCUTANEOUS
  Administered 2012-06-02: 2 [IU] via SUBCUTANEOUS
  Administered 2012-06-03 – 2012-06-05 (×3): 1 [IU] via SUBCUTANEOUS
  Administered 2012-06-05 – 2012-06-07 (×3): 2 [IU] via SUBCUTANEOUS
  Administered 2012-06-08 – 2012-06-09 (×4): 1 [IU] via SUBCUTANEOUS

## 2012-05-31 MED ORDER — ONDANSETRON HCL 4 MG/2ML IJ SOLN: 4.0000 mg | Freq: Four times a day (QID) | INTRAMUSCULAR | Status: DC | PRN

## 2012-05-31 MED ORDER — MOMETASONE FURO-FORMOTEROL FUM 200-5 MCG/ACT IN AERO
2.0000 | INHALATION_SPRAY | Freq: Two times a day (BID) | RESPIRATORY_TRACT | Status: DC
  Administered 2012-05-31 – 2012-06-10 (×16): 2 via RESPIRATORY_TRACT
  Filled 2012-05-31: qty 8.8

## 2012-05-31 MED ORDER — POLYETHYLENE GLYCOL 3350 17 G PO PACK
17.0000 g | PACK | Freq: Every day | ORAL | Status: DC | PRN
  Filled 2012-05-31: qty 1

## 2012-05-31 MED ORDER — LEVOTHYROXINE SODIUM 112 MCG PO TABS
112.0000 ug | ORAL_TABLET | Freq: Every day | ORAL | Status: DC
  Administered 2012-06-01 – 2012-06-10 (×10): 112 ug via ORAL
  Filled 2012-05-31 (×11): qty 1

## 2012-05-31 MED ORDER — MORPHINE SULFATE 15 MG PO TABS: 7.5000 mg | ORAL_TABLET | Freq: Four times a day (QID) | ORAL | Status: DC | PRN

## 2012-05-31 MED ORDER — ACETAMINOPHEN 325 MG PO TABS
325.0000 mg | ORAL_TABLET | ORAL | Status: DC | PRN
  Administered 2012-06-01 – 2012-06-10 (×22): 650 mg via ORAL
  Filled 2012-05-31 (×22): qty 2

## 2012-05-31 MED ORDER — CIPROFLOXACIN HCL 500 MG PO TABS
500.0000 mg | ORAL_TABLET | Freq: Every day | ORAL | Status: DC
  Administered 2012-05-31: 500 mg via ORAL
  Filled 2012-05-31 (×2): qty 1

## 2012-05-31 MED ORDER — SENNA 8.6 MG PO TABS: 1.0000 | ORAL_TABLET | Freq: Two times a day (BID) | ORAL | Status: DC

## 2012-05-31 MED ORDER — CIPROFLOXACIN HCL 250 MG PO TABS
250.0000 mg | ORAL_TABLET | Freq: Two times a day (BID) | ORAL | Status: DC
  Administered 2012-05-31 – 2012-06-03 (×6): 250 mg via ORAL
  Filled 2012-05-31 (×8): qty 1

## 2012-05-31 MED ORDER — PAROXETINE HCL 10 MG PO TABS
10.0000 mg | ORAL_TABLET | Freq: Every day | ORAL | Status: DC
  Administered 2012-06-01 – 2012-06-10 (×10): 10 mg via ORAL
  Filled 2012-05-31 (×13): qty 1

## 2012-05-31 MED ORDER — ASPIRIN 81 MG PO CHEW
81.0000 mg | CHEWABLE_TABLET | Freq: Every day | ORAL | Status: DC
  Administered 2012-06-01 – 2012-06-10 (×10): 81 mg via ORAL
  Filled 2012-05-31 (×9): qty 1

## 2012-05-31 MED ORDER — ONDANSETRON HCL 4 MG PO TABS: 4.0000 mg | ORAL_TABLET | Freq: Four times a day (QID) | ORAL | Status: DC | PRN

## 2012-05-31 MED ORDER — METHOCARBAMOL 500 MG PO TABS
500.0000 mg | ORAL_TABLET | Freq: Four times a day (QID) | ORAL | Status: DC | PRN
  Administered 2012-06-03 – 2012-06-09 (×8): 500 mg via ORAL
  Filled 2012-05-31 (×8): qty 1

## 2012-05-31 MED ORDER — WARFARIN - PHARMACIST DOSING INPATIENT: Freq: Every day | Status: DC

## 2012-05-31 MED ORDER — SORBITOL 70 % SOLN: 30.0000 mL | Freq: Every day | Status: DC | PRN

## 2012-05-31 MED ORDER — NITROGLYCERIN 0.4 MG SL SUBL: 0.4000 mg | SUBLINGUAL_TABLET | SUBLINGUAL | Status: DC | PRN

## 2012-05-31 MED ORDER — ALUM & MAG HYDROXIDE-SIMETH 200-200-20 MG/5ML PO SUSP: 30.0000 mL | ORAL | Status: DC | PRN

## 2012-05-31 MED ORDER — ATORVASTATIN CALCIUM 40 MG PO TABS
40.0000 mg | ORAL_TABLET | Freq: Every day | ORAL | Status: DC
  Administered 2012-05-31 – 2012-06-09 (×10): 40 mg via ORAL
  Filled 2012-05-31 (×11): qty 1

## 2012-05-31 NOTE — Progress Notes (Signed)
     Subjective:  Patient reports pain as moderate.  No new complaints.  Objective:   VITALS:   Filed Vitals:   05/30/12 2333 05/31/12 0000 05/31/12 0400 05/31/12 0733  BP: 130/60   135/62  Pulse: 74   70  Temp: 99.2 F (37.3 C)   98.9 F (37.2 C)  TempSrc:      Resp: 18 20 18 20   Height:      Weight:      SpO2: 99%   99%    Neurologically intact Dorsiflexion/Plantar flexion intact Incision: scant drainage   Lab Results  Component Value Date   WBC 11.8* 05/30/2012   HGB 10.8* 05/30/2012   HCT 32.9* 05/30/2012   MCV 92.2 05/30/2012   PLT 89* 05/30/2012     Assessment/Plan: 4 Days Post-Op   Principal Problem:  *Closed intertrochanteric fracture of left hip Active Problems:  HYPERLIPIDEMIA-MIXED  HYPERTENSION, UNSPECIFIED  HYPOTENSION, ORTHOSTATIC  Diabetes mellitus   Advance diet Up with therapy  Plan for inpatient rehabilitation when bed available. Dressing change tomorrow.   Daveena Elmore P 05/31/2012, 7:58 AM   Teryl Lucy, MD Cell [phone] Pager [phone]

## 2012-05-31 NOTE — Progress Notes (Addendum)
ANTICOAGULATION CONSULT NOTE - Follow Up Consult  Pharmacy Consult for coumadin Indication: VTE prophylaxis  Allergies  Allergen Reactions  . Codeine Anaphylaxis and Swelling  . Penicillins Anaphylaxis and Swelling  . Sulfonamide Derivatives Anaphylaxis and Swelling   Labs:  Basename 05/31/12 0805 05/30/12 0530 05/29/12 2137 05/29/12 0630  HGB -- 10.8* 10.8* --  HCT -- 32.9* 32.1* 22.2*  PLT -- 89* 85* 84*  APTT -- -- -- --  LABPROT 38.7* 35.1* -- 16.2*  INR 4.32* 3.78* -- 1.33  HEPARINUNFRC -- -- -- --  CREATININE -- -- -- 1.28*  CKTOTAL -- -- -- --  CKMB -- -- -- --  TROPONINI -- -- -- --    Estimated Creatinine Clearance: 22.7 ml/min (by C-G formula based on Cr of 1.28).  Assessment: Anticoag: 79 yo female on Coumadin for VTE px s/p ortho surgery. INR is currently supra-therapeutic, increased despite held dose yesterday (4.32). Noted on Cipro concurrently, which may increase INR slightly. CBC stable as of 1/14- no overt bleeding reported. Noted plans for IP-rehab transfer today. Coumadin education was done 1/12 and documented in [facility].  ID: Ancef post op-now on Cipro (day #2), afeb, WBC 11.8. No cx data Card: hx of CAD, HTN, and hyperlipidemia. ASA, lipitor. VSS Pulm: asthma on dulera Endo/GI: hx of hypothyroidism on synthroid. Hx of DM on SSI. CBG stable Hem/Onc: hx of anemia H/H 10.8/32.9; Plt 89 (2 units PRBCs 1/13) Renal: SCr 1.28 as of 1/13, lytes were ok. Home meds: metformin not yet resumed Best practice: coumadin  Goal of Therapy:  INR 2-3  Plan:  - No Coumadin today, Lovenox discontinued - INR in am- will need to reorder labs if transfers to rehab  Thanks, Saul Dorsi K. Allena Katz, PharmD, BCPS.  Clinical Pharmacist Pager [phone]. 05/31/2012 11:12 AM

## 2012-05-31 NOTE — PMR Pre-admission (Signed)
PMR Admission Coordinator Pre-Admission Assessment  Patient: [name] is an 79 y.o., female MRN: [id number] DOB: 1924/03/24 Height: 5' (152.4 cm) Weight: 47.5 kg (104 lb 11.5 oz)              Insurance Information HMO:  Yes    PPO:       PCP:       IPA:       80/20:       OTHER:  Group #[id number] PRIMARY: Blue Medicare      Policy#: [id number]      Subscriber: Ky Barban CM Name: [name]      Phone#: [phone]     Fax#: [phone] Pre-Cert#: [id number] with update due 06/06/12      Employer: Retired Benefits:  Phone #: [phone]     Name: Aliene Beams. Date: 05/17/12     Deduct: $0      Out of Pocket Max: $3400      Life Max: Unlimited CIR: $170 days 1-7, $0 days 8+      SNF: $0 days 1-10, $50 days 11-100 Outpatient: Pre auth for SLP     Co-Pay: $35/visit Home Health: 100%      Co-Pay: none DME: 80%     Co-Pay: 20% Providers: in network  SECONDARY: Medicaid      Policy#: [id number]      Subscriber: Ky Barban CM Name:        Phone#:       Fax#:   Pre-Cert#:        Employer:   Benefits:  Phone #:       Name:   Eff. Date:       Deduct:        Out of Pocket Max:        Life Max:   CIR:        SNF:   Outpatient:       Co-Pay:   Home Health:        Co-Pay:   DME:       Co-Pay:    Emergency Contact Information Contact Information    Name Relation Home Work Mobile   Whitt,Patricia Daughter [phone]  [phone]   Markiesha, Delia [phone]  [phone]     Current Medical History  Patient Admitting Diagnosis:Left IT hip fx, diabetic peripheral neuropathy   History of Present Illness: A 79 y.o. right-handed female with history of hypertension, chronic kidney insufficiency with baseline creatinine 1.4 and diabetes mellitus with peripheral neuropathy. Patient with history of multiple falls most recently October 2012 resulting in knee fracture and skilled nursing facility admission. Admitted 05/26/2012 after a fall while patient was at the hair salon and she stood  up starting to walk when she became dizzy. She denied any loss of consciousness. X-rays and imaging revealed left intertrochanteric hip fracture. Underwent intramedullary nail 05/27/2012 per Dr. Dion Saucier. Advised weightbearing as tolerated. Postoperative pain management. Placed on Coumadin for DVT prophylaxis and subcutaneous Lovenox the INR greater than 2.00. Physical therapy evaluation completed 05/28/2012 with recommendations of physical medicine rehabilitation consult to consider inpatient rehabilitation services.   Past Medical History  Past Medical History  Diagnosis Date  . CAD (coronary artery disease)     a. 10/2007 PCI Diag w/ 2.25 x 8mm Taxus Atom DES.;  b. 09/2011 Lexi MV: EF 69%, no ischemia/infarct.  Marland Kitchen PVD (peripheral vascular disease)   . Carotid artery stenosis   . HTN (hypertension)   . Hyperlipidemia   . Hypothyroidism   .  CKD (chronic kidney disease), stage III   . Diabetes mellitus 07/16/2011  . Allergic state 07/16/2011  . Insomnia 07/16/2011  . Recurrent UTI 07/16/2011  . Asthma     childhood, recurrent at 77  . Myelodysplastic syndrome 04/02/2011  . Sinusitis 07/18/2011  . Anemia 07/18/2011  . Arthritis 07/18/2011  . Cataract extraction status of right eye 08/18/2011  . Anxiety and depression 08/18/2011  . Bronchitis, acute 12/20/2011  . Headache 03/02/2012  . AV heart block     a. 10/2007 MDT Versa dual chamber PPM.  . Bladder cancer 12/2002  . Closed intertrochanteric fracture of left hip 05/27/2012    Family History  family history includes Arthritis in her daughter, father, and mother; Cancer in her brother; Heart disease in her mother; Hypertension in her father; and Kidney disease in her father.  Prior Rehab/Hospitalizations:  Micah Flesher to a SNF in 10/12 after knee injury.  Had [facility] therapies post SNF through [facility].   Current Medications  Current facility-administered medications:acetaminophen (TYLENOL) suppository 650 mg, 650 mg, Rectal, Q6H PRN, Eulas Post, MD;  acetaminophen  (TYLENOL) tablet 650 mg, 650 mg, Oral, Q6H PRN, Eulas Post, MD, 650 mg at 05/29/12 2116;  alum & mag hydroxide-simeth (MAALOX/MYLANTA) 200-200-20 MG/5ML suspension 30 mL, 30 mL, Oral, Q4H PRN, Eulas Post, MD amLODipine (NORVASC) tablet 5 mg, 5 mg, Oral, Daily, Rhetta Mura, MD, 5 mg at 05/30/12 1532;  aspirin chewable tablet 81 mg, 81 mg, Oral, Daily, Hollice Espy, MD, 81 mg at 05/30/12 1004;  atorvastatin (LIPITOR) tablet 40 mg, 40 mg, Oral, q1800, Hillary Bow, DO, 40 mg at 05/30/12 1859;  ciprofloxacin (CIPRO) IVPB 400 mg, 400 mg, Intravenous, BID, Rhetta Mura, MD, 400 mg at 05/31/12 0847 docusate sodium (COLACE) capsule 100 mg, 100 mg, Oral, BID, Eulas Post, MD, 100 mg at 05/30/12 2118;  insulin aspart (novoLOG) injection 0-9 Units, 0-9 Units, Subcutaneous, TID WC, Rhetta Mura, MD, 2 Units at 05/30/12 1717;  levothyroxine (SYNTHROID, LEVOTHROID) tablet 112 mcg, 112 mcg, Oral, QAC breakfast, Hillary Bow, DO, 112 mcg at 05/31/12 0847 menthol-cetylpyridinium (CEPACOL) lozenge 3 mg, 1 lozenge, Oral, PRN, Eulas Post, MD;  metoCLOPramide (REGLAN) injection 5-10 mg, 5-10 mg, Intravenous, Q8H PRN, Eulas Post, MD;  metoCLOPramide (REGLAN) tablet 5-10 mg, 5-10 mg, Oral, Q8H PRN, Eulas Post, MD;  mometasone-formoterol [facility]) 200-5 MCG/ACT inhaler 2 puff, 2 puff, Inhalation, BID, Hillary Bow, DO, 2 puff at 05/31/12 0847 montelukast (SINGULAIR) tablet 10 mg, 10 mg, Oral, Daily, Hillary Bow, DO, 10 mg at 05/30/12 1004;  morphine (MSIR) tablet 7.5 mg, 7.5 mg, Oral, Q6H PRN, Hillary Bow, DO, 7.5 mg at 05/31/12 0703;  morphine 2 MG/ML injection 0.5 mg, 0.5 mg, Intravenous, Q2H PRN, Eulas Post, MD, 0.5 mg at 05/28/12 2246;  morphine 4 MG/ML injection 4 mg, 4 mg, Intravenous, Once, Consuello Masse, MD nitroGLYCERIN (NITROSTAT) SL tablet 0.4 mg, 0.4 mg, Sublingual, Q5 Min x 3 PRN, Hillary Bow, DO;  ondansetron (ZOFRAN) injection 4 mg,  4 mg, Intravenous, Q6H PRN, Eulas Post, MD;  ondansetron (ZOFRAN) tablet 4 mg, 4 mg, Oral, Q6H PRN, Eulas Post, MD;  PARoxetine (PAXIL) tablet 10 mg, 10 mg, Oral, Daily, Hillary Bow, DO, 10 mg at 05/30/12 1004 phenol (CHLORASEPTIC) mouth spray 1 spray, 1 spray, Mouth/Throat, PRN, Eulas Post, MD;  polyethylene glycol (MIRALAX / GLYCOLAX) packet 17 g, 17 g, Oral, Daily PRN, Eulas Post, MD, 17 g at  05/30/12 2124;  promethazine (PHENERGAN) injection 6.25-12.5 mg, 6.25-12.5 mg, Intravenous, Q15 min PRN, E. Jairo Ben, MD;  senna [facility]) tablet 8.6 mg, 1 tablet, Oral, BID, Eulas Post, MD, 8.6 mg at 05/30/12 2118 sorbitol 70 % solution 30 mL, 30 mL, Oral, Daily PRN, Eulas Post, MD;  Warfarin - Pharmacist Dosing Inpatient, , Does not apply, q1800, Rhetta Mura, MD  Patients Current Diet: Carb Control  Precautions / Restrictions Precautions Precautions: Fall Precaution Comments: multiple fall risk factor including orthostasis, multiple medications, decrease strength, decreased balance ability Restrictions Weight Bearing Restrictions: Yes LLE Weight Bearing: Weight bearing as tolerated   Prior Activity Level Community (5-7x/wk): Went out daily.  Goes to [address] at post office most days.  Home Assistive Devices / Equipment Home Assistive Devices/Equipment: CBG Meter Home Adaptive Equipment: Bedside commode/3-in-1;Tub transfer bench;Walker - rolling  Prior Functional Level Prior Function Level of Independence: Needs assistance Needs Assistance: Light Housekeeping Light Housekeeping: Moderate Able to Take Stairs?: Yes Driving: Yes Vocation: Retired Comments: Like to drive to post office and go shopping at [employer]  Current Functional Level Cognition  Arousal/Alertness: Awake/alert Overall Cognitive Status: Appears within functional limits for tasks assessed/performed Orientation Level: Oriented to person;Oriented to place;Oriented to  situation Cognition - Other Comments: intact    Extremity Assessment (includes Sensation/Coordination)  RUE ROM/Strength/Tone: Within functional levels RUE Sensation: WFL - Light Touch RUE Coordination: WFL - gross/fine motor  RLE ROM/Strength/Tone: Deficits RLE ROM/Strength/Tone Deficits: generally deconditioned with ability to bear bodyweight for transfers and walking but poor eccentric control and decreased muscular endurance    ADLs  Eating/Feeding: Performed;Independent Where Assessed - Eating/Feeding: Chair Grooming: Performed;Wash/dry hands;Min guard Where Assessed - Grooming: Supported standing Upper Body Bathing: Performed;Set up Where Assessed - Upper Body Bathing: Unsupported sitting Lower Body Bathing: Performed;Maximal assistance Where Assessed - Lower Body Bathing: Supported sit to stand Upper Body Dressing: Performed;Set up Where Assessed - Upper Body Dressing: Unsupported sitting Lower Body Dressing: Performed;Maximal assistance Where Assessed - Lower Body Dressing: Supported sit to stand Toilet Transfer: Performed;Minimal assistance Toilet Transfer Method: Surveyor, minerals: Comfort height toilet;Grab bars Toileting - Architect and Hygiene: Performed;Minimal assistance Where Assessed - Engineer, mining and Hygiene: Standing Equipment Used: Rolling walker Transfers/Ambulation Related to ADLs: pt walked from bed to bathroom with min guard assist to min assist at times.  Pt transferred with min assist with cues to let herself down slowly and to push up from surface she is leaving. ADL Comments: Pt needs increased assist with LE adls due to pain.    Mobility  Bed Mobility: Supine to Sit;Sitting - Scoot to Edge of Bed Supine to Sit: 3: Mod assist;HOB elevated;With rails Sitting - Scoot to Edge of Bed: 3: Mod assist    Transfers  Transfers: Sit to Stand;Stand to Sit Sit to Stand: 4: Min assist;With upper extremity  assist;From bed;From toilet Stand to Sit: 4: Min assist;With upper extremity assist;To toilet;To chair/3-in-1;With armrests    Ambulation / Gait / Stairs / Wheelchair Mobility  Ambulation/Gait Ambulation/Gait Assistance: 4: Min Environmental consultant (Feet): 15 Feet (x2) Assistive device: Rolling walker Ambulation/Gait Assistance Details: cues for positioning in RW, upright posture, gait sequencing.   Gait Pattern: Step-to pattern;Decreased step length - right;Decreased stance time - left;Trunk flexed Stairs: No Wheelchair Mobility Wheelchair Mobility: No    Posture / Holiday representative Standing - Balance Support: Bilateral upper extremity supported;During functional activity Static Standing - Level of Assistance: 5: Stand by assistance Static Standing -  Comment/# of Minutes: pt able to stand with S holding RW while PT performed peri hygiene.      Special needs/care consideration BiPAP/CPAP No CPM No Continuous Drip IV No Dialysis NO       Life Vest No Oxygen No Special Bed NO Trach Size No Wound Vac (area) NO      Skin Incision L hip healing                            Bowel mgmt: No BM since 05/26/12 Bladder mgmt: Voiding in bathroom.  Some incontinence.  Patient states she has a UTI. Diabetic mgmt Yes controlled with oral medications at home.     Previous Home Environment Living Arrangements: Alone Lives With: Alone Available Help at Discharge: Family Type of Home: House Home Layout: One level Home Access: Stairs to enter Entrance Stairs-Rails: Left Entrance Stairs-Number of Steps: 2 Bathroom Shower/Tub: Tub/shower unit;Curtain Bathroom Toilet: Standard Bathroom Accessibility: Yes How Accessible: Accessible via walker Home Care Services: No Additional Comments: pt has multiple injurious falls, most recent in Nov 2013, Oct 2012.  10/12 fall resulting in knee fracture and SNF admission  Discharge Living Setting Plans for Discharge Living  Setting: Patient's home;Alone;House Type of Home at Discharge: House Discharge Home Layout: One level Discharge Home Access: Stairs to enter Entrance Stairs-Number of Steps: 2 steps to porch and 1 more step to sunroom Do you have any problems obtaining your medications?: No  Social/Family/Support Systems Patient Roles: Parent Contact Information: Orvan July - Dtr; Maryann Alar - son; Lorine Bears - Dtr Anticipated Caregiver: son, dtr and self Anticipated Caregiver's Contact Information: See emergency contacts Ability/Limitations of Caregiver: Dtr works, Son works.  Will have intermittent supervision (Son lives close by, their land touches.) Caregiver Availability: Intermittent (Has 2 dtrs and 1 son.  One dtr has health problems.) Discharge Plan Discussed with Primary Caregiver: Yes Is Caregiver In Agreement with Plan?: Yes Does Caregiver/Family have Issues with Lodging/Transportation while Pt is in Rehab?: No    Goals/Additional Needs Patient/Family Goal for Rehab: PT S/Mod I, OT Mod I to min A, no ST needs.  Patient hopes to get to Mod I prior to home. Expected length of stay: 2 weeks Cultural Considerations: Baptist.  Husband was a Statistician, deceased 20 yrs ago Dietary Needs: Carb Mod Med Calorie diabetic diet Equipment Needs: TBD Pt/Family Agrees to Admission and willing to participate: Yes Program Orientation Provided & Reviewed with Pt/Caregiver Including Roles  & Responsibilities: Yes   Decrease burden of Care through IP rehab admission:  Not applicable  Possible need for SNF placement upon discharge: Not likely   Patient Condition: This patient's condition remains as documented in the Consult dated 05/29/12, in which the Rehabilitation Physician determined and documented that the patient's condition is appropriate for intensive rehabilitative care in an inpatient rehabilitation facility.  Preadmission Screen Completed By:  Trish Mage, 05/31/2012 10:04  AM ______________________________________________________________________   Discussed status with Dr. Riley Kill on 05/31/12 at 1015 and received telephone approval for admission today.  Admission Coordinator:  Trish Mage, time1122/Date01/15/14

## 2012-05-31 NOTE — Plan of Care (Signed)
Overall Plan of Care [facility]) Patient Details Name: [name] MRN: [id number] DOB: Jun 26, 1923  Diagnosis:  Left intertrochanteric hip fx  Co-morbidities: CAD, PVD, HTN, CKD, DM, UTI, myelodysplastic syndrome  Functional Problem List  Patient demonstrates impairments in the following areas: Balance, Bladder, Bowel, Edema, Endurance, Pain, Safety and Skin Integrity  Basic ADL's: grooming, bathing, dressing and toileting Advanced ADL's: simple meal preparation and laundry  Transfers:  bed mobility, bed to chair, toilet, tub/shower, car and furniture Locomotion:  ambulation and stairs  Additional Impairments:  Leisure Awareness  Anticipated Outcomes Item Anticipated Outcome  Eating/Swallowing    Basic self-care  Mod I  Tolieting  Mod I  Bowel/Bladder  Continent of bowel and continent of bladder with timed toileting.   Transfers  Mod I; S car  Locomotion  Mod I gait; S stairs  Communication    Cognition    Pain  3 or less  Safety/Judgment    Other     Therapy Plan: PT Intensity: Minimum of 1-2 x/day ,45 to 90 minutes PT Frequency: 5 out of 7 days PT Duration Estimated Length of Stay: 10-12 days OT Intensity: Minimum of 1-2 x/day, 45 to 90 minutes OT Frequency: 5 out of 7 days OT Duration/Estimated Length of Stay: 10-14 days      Team Interventions: Item RN PT OT SLP SW TR Other  Self Care/Advanced ADL Retraining  x x      Neuromuscular Re-Education  x x      Therapeutic Activities  x x      UE/LE Strength Training/ROM  x x      UE/LE Coordination Activities  x x      Visual/Perceptual Remediation/Compensation         DME/Adaptive Equipment Instruction  x x      Therapeutic Exercise x x x      Balance/Vestibular Training  x x      Patient/Family Education x x x      Cognitive Remediation/Compensation         Functional Mobility Training  x x      Ambulation/Gait Training  x x      Stair Training  x       Wheelchair Propulsion/Positioning  x x        Functional Tourist information centre manager Reintegration  x x      Dysphagia/Aspiration Film/video editor         Bladder Management x        Bowel Management x        Disease Management/Prevention x        Pain Management x x x      Medication Management         Skin Care/Wound Management x x x      Splinting/Orthotics  x x      Discharge Planning  x x      Psychosocial Support  x x                             Team Discharge Planning: Destination: PT-Home ,OT- Home , SLP-  Projected Follow-up: PT-Home health PT;Other (comment) (intermittent S due to h/o multiple falls), OT-  Home health OT, SLP-  Projected Equipment Needs: PT-Rolling walker with 5" wheels, OT-  , SLP-  Patient/family involved in discharge planning: PT- Patient,  OT-Patient, SLP-  MD ELOS: 10-12 days Medical Rehab Prognosis:  Excellent Assessment: The patient has been admitted for CIR therapies. The team will be addressing, functional mobility, strength, stamina, balance, safety, adaptive techniques/equipment, self-care, bowel and bladder mgt, patient and caregiver education, pain mgt, ortho precautions, wound care. Goals have been set at mod I. Pt is quite motivated and has a supportive family.      See Team Conference Notes for weekly updates to the plan of care

## 2012-05-31 NOTE — Progress Notes (Signed)
Pt admitted to rehab unit at 1425 with daughter and son at bedside. Rehab booklet given with verbal understanding of process and safety plan. Pt signed sheet. Pt in bed with call bell in reach, VS stable, and pain managed. Family at bedside.

## 2012-05-31 NOTE — Progress Notes (Signed)
Rehab admissions - Bed available on inpatient rehab today and can admit to inpatient rehab today.  Call me for questions.  #[phone]

## 2012-05-31 NOTE — Progress Notes (Signed)
Seen and agreed 03/21/2013 Vanecia Limpert Elizabeth PTA [phone] pager [phone] office    

## 2012-05-31 NOTE — H&P (Signed)
Physical Medicine and Rehabilitation Admission H&P  Chief Complaint   Patient presents with   .  Fall   .  Hip Pain   :  HPI: [name] is a 79 y.o. right-handed female with history of hypertension, chronic kidney insufficiency with baseline creatinine 1.4 and diabetes mellitus with peripheral neuropathy. Patient with history of multiple falls most recently October 2012 resulting in knee fracture and skilled nursing facility admission. Admitted 05/26/2012 after a fall while patient was at the hair salon and she stood up starting to walk when she became dizzy. She denied any loss of consciousness. X-rays and imaging revealed left intertrochanteric hip fracture. Underwent intramedullary nail 05/27/2012 per Dr. Dion Saucier. Advised weightbearing as tolerated. Postoperative pain management. Acute blood loss anemia after surgery with hemoglobin 7.2 and transfused 2 units of packed red blood cells with latest hemoglobin 10.8. Placed on Coumadin for DVT prophylaxis and subcutaneous Lovenox until INR greater than 2.00. Physical therapy evaluation completed 05/28/2012 with recommendations of physical medicine rehabilitation consult to consider inpatient rehabilitation services. Patient was felt to be a candidate for inpatient rehabilitation services and was admitted for comprehensive rehabilitation program  Review of Systems  Genitourinary:  Urinary incontinence  Musculoskeletal: Positive for myalgias and falls.  Neurological: Positive for headaches.  Psychiatric/Behavioral: Positive for depression. The patient has insomnia.  All other systems reviewed and are negative  Past Medical History   Diagnosis  Date   .  CAD (coronary artery disease)      a. 10/2007 PCI Diag w/ 2.25 x 8mm Taxus Atom DES.; b. 09/2011 Lexi MV: EF 69%, no ischemia/infarct.   Marland Kitchen  PVD (peripheral vascular disease)    .  Carotid artery stenosis    .  HTN (hypertension)    .  Hyperlipidemia    .  Hypothyroidism    .  CKD (chronic kidney  disease), stage III    .  Diabetes mellitus  07/16/2011   .  Allergic state  07/16/2011   .  Insomnia  07/16/2011   .  Recurrent UTI  07/16/2011   .  Asthma      childhood, recurrent at 64   .  Myelodysplastic syndrome  04/02/2011   .  Sinusitis  07/18/2011   .  Anemia  07/18/2011   .  Arthritis  07/18/2011   .  Cataract extraction status of right eye  08/18/2011   .  Anxiety and depression  08/18/2011   .  Bronchitis, acute  12/20/2011   .  Headache  03/02/2012   .  AV heart block      a. 10/2007 MDT Versa dual chamber PPM.   .  Bladder cancer  12/2002   .  Closed intertrochanteric fracture of left hip  05/27/2012    Past Surgical History   Procedure  Date   .  Cholecystectomy    .  Pcm - medtronic    .  Abdominal hysterectomy    .  Shoulder open rotator cuff repair    .  Foot surgery      bunionectomy   .  Intramedullary (im) nail intertrochanteric  05/27/2012     Procedure: INTRAMEDULLARY (IM) NAIL INTERTROCHANTRIC; Surgeon: Eulas Post, MD; Location: [facility] OR; Service: Orthopedics; Laterality: Left;    Family History   Problem  Relation  Age of Onset   .  Heart disease  Mother       chf    .  Arthritis  Mother    .  Arthritis  Father    .  Kidney disease  Father    .  Hypertension  Father    .  Cancer  Brother       lung    .  Arthritis  Daughter       MVA with very serious injuries    Social History: reports that she has never smoked. She has never used smokeless tobacco. She reports that she does not drink alcohol or use illicit drugs.  Allergies:  Allergies   Allergen  Reactions   .  Codeine  Anaphylaxis and Swelling   .  Penicillins  Anaphylaxis and Swelling   .  Sulfonamide Derivatives  Anaphylaxis and Swelling    Medications Prior to Admission   Medication  Sig  Dispense  Refill   .  acetaminophen (TYLENOL) 500 MG tablet  Take 500 mg by mouth daily as needed. For pain     .  amLODipine (NORVASC) 5 MG tablet  Take 5 mg by mouth daily.     Marland Kitchen  aspirin 81 MG tablet  Take  81 mg by mouth daily.     .  cefdinir (OMNICEF) 300 MG capsule  Take 1 capsule (300 mg total) by mouth 2 (two) times daily.  14 capsule  0   .  levothyroxine (SYNTHROID, LEVOTHROID) 112 MCG tablet  Take 112 mcg by mouth daily.     .  metFORMIN (GLUMETZA) 1000 MG (MOD) 24 hr tablet  Take 1 tablet (1,000 mg total) by mouth 2 (two) times daily with a meal.  60 tablet  4   .  Mometasone Furo-Formoterol Fum (DULERA) 200-5 MCG/ACT AERO  Inhale 2 puffs into the lungs 3 (three) times daily as needed. For shortness of breath     .  montelukast (SINGULAIR) 10 MG tablet  Take 10 mg by mouth daily.     .  nitroGLYCERIN (NITROSTAT) 0.4 MG SL tablet  Place 1 tablet (0.4 mg total) under the tongue every 5 (five) minutes x 3 doses as needed for chest pain.  25 tablet  3   .  PARoxetine (PAXIL) 10 MG tablet  Take 10 mg by mouth every morning.     .  rosuvastatin (CRESTOR) 20 MG tablet  Take 20 mg by mouth at bedtime.      Home:  Home Living  Lives With: Alone  Available Help at Discharge: Family  Type of Home: House  Home Access: Stairs to enter  Secretary/administrator of Steps: 2  Entrance Stairs-Rails: Left  Home Layout: One level  Bathroom Shower/Tub: Tub/shower unit;Curtain  Bathroom Toilet: Standard  Bathroom Accessibility: Yes  How Accessible: Accessible via walker  Home Adaptive Equipment: Bedside commode/3-in-1;Tub transfer bench;Walker - rolling  Additional Comments: pt has multiple injurious falls, most recent in Nov 2013, Oct 2012. 10/12 fall resulting in knee fracture and SNF admission  Functional History:  Prior Function  Light Housekeeping: Moderate  Able to Take Stairs?: Yes  Driving: Yes  Vocation: Retired  Comments: Like to drive to post office and go shopping at [employer]  Functional Status:  Mobility:  Bed Mobility  Bed Mobility: Supine to Sit;Sitting - Scoot to Edge of Bed  Supine to Sit: 3: Mod assist;HOB elevated;With rails  Sitting - Scoot to Edge of Bed: 3: Mod assist;With  rail  Transfers  Transfers: Sit to Stand;Stand to Sit  Sit to Stand: 4: Min assist;From bed;With armrests  Stand to Sit: 4: Min assist;With armrests;To toilet  Ambulation/Gait  Ambulation/Gait  Assistance: Not tested (comment) (pivotal steps to chair with cues for sequencing with RW)  Stairs: No  Wheelchair Mobility  Wheelchair Mobility: No  ADL:  ADL  Eating/Feeding: Performed;Independent  Where Assessed - Eating/Feeding: Chair  Grooming: Performed;Wash/dry hands;Min guard  Where Assessed - Grooming: Supported standing  Upper Body Bathing: Performed;Set up  Where Assessed - Upper Body Bathing: Unsupported sitting  Lower Body Bathing: Performed;Maximal assistance  Where Assessed - Lower Body Bathing: Supported sit to stand  Upper Body Dressing: Performed;Set up  Where Assessed - Upper Body Dressing: Unsupported sitting  Lower Body Dressing: Performed;Maximal assistance  Where Assessed - Lower Body Dressing: Supported sit to stand  Toilet Transfer: Performed;Minimal assistance  Toilet Transfer Method: Stand Dealer: Comfort height toilet;Grab bars  Equipment Used: Rolling walker  Transfers/Ambulation Related to ADLs: pt walked from bed to bathroom with min guard assist to min assist at times. Pt transferred with min assist with cues to let herself down slowly and to push up from surface she is leaving.  ADL Comments: Pt needs increased assist with LE adls due to pain.  Cognition:  Cognition  Arousal/Alertness: Awake/alert  Orientation Level: Oriented to person;Oriented to place;Oriented to situation  Cognition  Overall Cognitive Status: Appears within functional limits for tasks assessed/performed  Arousal/Alertness: Awake/alert  Orientation Level: Oriented X4 / Intact  Behavior During Session: [facility] for tasks performed  Cognition - Other Comments: intact  Blood pressure 135/58, pulse 74, temperature 98.6 F (37 C), temperature source Oral, resp. rate 16,  height 5' (1.524 m), weight 47.5 kg (104 lb 11.5 oz), SpO2 96.00%.  Physical Exam  Vitals reviewed.  Constitutional: She is oriented to person, place, and time. NAD HENT: oral mucosa pink and moist Head: Normocephalic.  Eyes:  Pupils round and reactive to light  Neck: Normal range of motion. Neck supple. No thyromegaly present.  Cardiovascular: Normal rate and regular rhythm. No M,R,G Pulmonary/Chest: Effort normal and breath sounds normal. No respiratory distress. No wheezes, rales or rhonchi Abdominal: Soft. Bowel sounds are normal. She exhibits no distension.  Musculoskeletal: She exhibits no edema.  Left hip appropriately tender. Dressed with minimal serosanginous drainage Neurological: She is alert and oriented to person, place, and time.  Follows full commands. fxnl UE strength. No sensory deficits except for mild LT and PP loss in the feet. Right LE 3/5 prox to 4+/5 distally .could not lift left leg agst gravity but 4/5 distally at ankle. Skin:  Left hip incision is dressed  Psychiatric: She has a normal mood and affect. Her behavior is normal. Judgment and thought content normal  Results for orders placed during the hospital encounter of 05/26/12 (from the past 48 hour(s))   GLUCOSE, CAPILLARY Status: Abnormal    Collection Time    05/28/12 8:19 AM   Component  Value  Range  Comment    Glucose-Capillary  147 (*)  70 - 99 mg/dL    BASIC METABOLIC PANEL Status: Abnormal    Collection Time    05/28/12 9:30 AM   Component  Value  Range  Comment    Sodium  136  135 - 145 mEq/L     Potassium  4.9  3.5 - 5.1 mEq/L     Chloride  101  96 - 112 mEq/L     CO2  23  19 - 32 mEq/L     Glucose, Bld  148 (*)  70 - 99 mg/dL     BUN  16  6 - 23  mg/dL     Creatinine, Ser  1.47 (*)  0.50 - 1.10 mg/dL     Calcium  8.5  8.4 - 10.5 mg/dL     GFR calc non Af Amer  36 (*)  >90 mL/min     GFR calc Af Amer  42 (*)  >90 mL/min    GLUCOSE, CAPILLARY Status: Abnormal    Collection Time    05/28/12  11:42 AM   Component  Value  Range  Comment    Glucose-Capillary  145 (*)  70 - 99 mg/dL    GLUCOSE, CAPILLARY Status: Abnormal    Collection Time    05/28/12 4:11 PM   Component  Value  Range  Comment    Glucose-Capillary  116 (*)  70 - 99 mg/dL    GLUCOSE, CAPILLARY Status: Abnormal    Collection Time    05/28/12 10:05 PM   Component  Value  Range  Comment    Glucose-Capillary  163 (*)  70 - 99 mg/dL    CBC Status: Abnormal    Collection Time    05/29/12 6:30 AM   Component  Value  Range  Comment    WBC  9.2  4.0 - 10.5 K/uL     RBC  2.28 (*)  3.87 - 5.11 MIL/uL     Hemoglobin  7.2 (*)  12.0 - 15.0 g/dL     HCT  82.9 (*)  56.2 - 46.0 %     MCV  97.4  78.0 - 100.0 fL     MCH  31.6  26.0 - 34.0 pg     MCHC  32.4  30.0 - 36.0 g/dL     RDW  13.0 (*)  86.5 - 15.5 %     Platelets  84 (*)  150 - 400 K/uL  CONSISTENT WITH PREVIOUS RESULT   BASIC METABOLIC PANEL Status: Abnormal    Collection Time    05/29/12 6:30 AM   Component  Value  Range  Comment    Sodium  138  135 - 145 mEq/L     Potassium  4.1  3.5 - 5.1 mEq/L     Chloride  104  96 - 112 mEq/L     CO2  25  19 - 32 mEq/L     Glucose, Bld  135 (*)  70 - 99 mg/dL     BUN  17  6 - 23 mg/dL     Creatinine, Ser  7.84 (*)  0.50 - 1.10 mg/dL     Calcium  8.5  8.4 - 10.5 mg/dL     GFR calc non Af Amer  37 (*)  >90 mL/min     GFR calc Af Amer  43 (*)  >90 mL/min    PROTIME-INR Status: Abnormal    Collection Time    05/29/12 6:30 AM   Component  Value  Range  Comment    Prothrombin Time  16.2 (*)  11.6 - 15.2 seconds     INR  1.33  0.00 - 1.49    GLUCOSE, CAPILLARY Status: Abnormal    Collection Time    05/29/12 7:01 AM   Component  Value  Range  Comment    Glucose-Capillary  134 (*)  70 - 99 mg/dL    PREPARE RBC (CROSSMATCH) Status: Normal    Collection Time    05/29/12 9:42 AM   Component  Value  Range  Comment    Order Confirmation  ORDER PROCESSED BY BLOOD BANK  GLUCOSE, CAPILLARY Status: Abnormal    Collection Time     05/29/12 12:01 PM   Component  Value  Range  Comment    Glucose-Capillary  133 (*)  70 - 99 mg/dL     Comment 1  Documented in Chart      Comment 2  Notify RN     GLUCOSE, CAPILLARY Status: Abnormal    Collection Time    05/29/12 4:39 PM   Component  Value  Range  Comment    Glucose-Capillary  205 (*)  70 - 99 mg/dL     Comment 1  Documented in Chart      Comment 2  Notify RN     GLUCOSE, CAPILLARY Status: Abnormal    Collection Time    05/29/12 9:36 PM   Component  Value  Range  Comment    Glucose-Capillary  152 (*)  70 - 99 mg/dL     Comment 1  Notify RN     CBC WITH DIFFERENTIAL Status: Abnormal    Collection Time    05/29/12 9:37 PM   Component  Value  Range  Comment    WBC  12.2 (*)  4.0 - 10.5 K/uL     RBC  3.53 (*)  3.87 - 5.11 MIL/uL     Hemoglobin  10.8 (*)  12.0 - 15.0 g/dL  POST TRANSFUSION SPECIMEN    HCT  32.1 (*)  36.0 - 46.0 %     MCV  90.9  78.0 - 100.0 fL  POST TRANSFUSION SPECIMEN    MCH  30.6  26.0 - 34.0 pg     MCHC  33.6  30.0 - 36.0 g/dL     RDW  10.9 (*)  60.4 - 15.5 %     Platelets  85 (*)  150 - 400 K/uL  CONSISTENT WITH PREVIOUS RESULT    Neutrophils Relative  75  43 - 77 %     Neutro Abs  9.1 (*)  1.7 - 7.7 K/uL     Lymphocytes Relative  8 (*)  12 - 46 %     Lymphs Abs  1.0  0.7 - 4.0 K/uL     Monocytes Relative  13 (*)  3 - 12 %     Monocytes Absolute  1.6 (*)  0.1 - 1.0 K/uL     Eosinophils Relative  3  0 - 5 %     Eosinophils Absolute  0.4  0.0 - 0.7 K/uL     Basophils Relative  0  0 - 1 %     Basophils Absolute  0.0  0.0 - 0.1 K/uL    CBC Status: Abnormal    Collection Time    05/30/12 5:30 AM   Component  Value  Range  Comment    WBC  11.8 (*)  4.0 - 10.5 K/uL     RBC  3.57 (*)  3.87 - 5.11 MIL/uL     Hemoglobin  10.8 (*)  12.0 - 15.0 g/dL     HCT  54.0 (*)  98.1 - 46.0 %     MCV  92.2  78.0 - 100.0 fL     MCH  30.3  26.0 - 34.0 pg     MCHC  32.8  30.0 - 36.0 g/dL     RDW  19.1 (*)  47.8 - 15.5 %     Platelets  89 (*)  150 - 400 K/uL   CONSISTENT WITH PREVIOUS RESULT   PROTIME-INR  Status: Abnormal    Collection Time    05/30/12 5:30 AM   Component  Value  Range  Comment    Prothrombin Time  35.1 (*)  11.6 - 15.2 seconds     INR  3.78 (*)  0.00 - 1.49    GLUCOSE, CAPILLARY Status: Abnormal    Collection Time    05/30/12 6:44 AM   Component  Value  Range  Comment    Glucose-Capillary  147 (*)  70 - 99 mg/dL     Comment 1  Notify RN      Dg Chest Port 1 View  05/29/2012 *RADIOLOGY REPORT* Clinical Data: Postoperative fever. PORTABLE CHEST - 1 VIEW Comparison: 05/26/2012 Findings: Shallow inspiration. Stable appearance of cardiac pacemaker. Normal heart size and pulmonary vascularity. Diffuse interstitial fibrosis in the lungs. No focal consolidation. Bronchiectasis with peribronchial thickening likely representing chronic bronchitis. No blunting of costophrenic angles. No pneumothorax. Mediastinal contours appear intact. Calcified and tortuous aorta. Degenerative changes in the shoulders. IMPRESSION: No significant change since previous study. Diffuse chronic appearing interstitial changes, bronchiectasis, and chronic bronchitic changes. Original Report Authenticated By: Burman Nieves, M.D.   Post Admission Physician Evaluation:  1. Functional deficits secondary to left intertrochanteric hip fx s/p IMN on 05/27/12. 2. Patient is admitted to receive collaborative, interdisciplinary care between the physiatrist, rehab nursing staff, and therapy team. 3. Patient's level of medical complexity and substantial therapy needs in context of that medical necessity cannot be provided at a lesser intensity of care such as a SNF. 4. Patient has experienced substantial functional loss from his/her baseline which was documented above under the "Functional History" and "Functional Status" headings. Judging by the patient's diagnosis, physical exam, and functional history, the patient has potential for functional progress which will result in  measurable gains while on inpatient rehab. These gains will be of substantial and practical use upon discharge in facilitating mobility and self-care at the household level. 5. Physiatrist will provide 24 hour management of medical needs as well as oversight of the therapy plan/treatment and provide guidance as appropriate regarding the interaction of the two. 6. 24 hour rehab nursing will assist with bladder management, bowel management, safety, skin/wound care, disease management, medication administration, pain management and patient education and help integrate therapy concepts, techniques,education, etc. 7. PT will assess and treat for: Lower extremity strength, range of motion, stamina, balance, functional mobility, safety, adaptive techniques and equipment, pain mgt, education. Goals are: mod I to supervision. 8. OT will assess and treat for: ADL's, functional mobility, safety, upper extremity strength, adaptive techniques and equipment, pain mgt, education. Goals are: mod I to min assist. 9. SLP will assess and treat for: n/a. Goals are: n/a. 10. Case Management and Social Worker will assess and treat for psychological issues and discharge planning. 11. Team conference will be held weekly to assess progress toward goals and to determine barriers to discharge. 12. Patient will receive at least 3 hours of therapy per day at least 5 days per week. 13. ELOS: 2 weeks Prognosis: excellent Medical Problem List and Plan:  1. Left intertrochanteric hip fracture status post intramedullary nail 05/27/2012  2. DVT Prophylaxis/Anticoagulation: Coumadin for DVT prophylaxis. Latest INR 3.78  3. Pain Management: MSIR 7.5 mg every 6 hours as needed pain and Robaxin as needed. Monitor with increased mobility  4. Mood: Paxil 10 mg daily. Provide emotional support and positive reinforcement  5. Neuropsych: This patient is capable of making decisions on his/her own behalf.  6. Postoperative anemia. Transfused  2  units of packed red blood cells 05/29/2012. Followup CBC on admit. 7. hypertension. Norvasc 5 mg daily. Monitor with increased mobility  8. History of asthma.Dulera 2 puffs twice a day, Singulair 10 mg daily. Patient with no complaints of shortness of breath  9. Hypothyroidism. Synthroid  10. Diabetes mellitus. Latest hemoglobin A1c of 6.5. Continue sliding scale insulin. Check blood sugars a.c. and at bedtime. Patient on Glucophage 1000 mg twice a day prior to admission.  11. Chronic renal insufficiency. Baseline creatinine 1.4. Followup chemistries  12. CAD. Continue aspirin therapy. Patient no complaints of chest pain  13. Hyperlipidemia. Lipitor  14. Recurrent UTI. Placed  empirically on Cipro 05/30/2012 with urinalysis study negative nitrite and 7-10 WBC. Monitor for fever and await urine culture. Patient denies dysuria  15. Constipation: augment bowel regimen.  05/30/2012  Ranelle Oyster, MD, [place]

## 2012-05-31 NOTE — Progress Notes (Signed)
Pt with order for Cipro 500 mg q 12 hrs.  However, due to CrCl < 30, will adjust dose to daily.  Thanks!  Tad Moore, BCPS  Clinical Pharmacist Pager [phone]  05/31/2012 11:57 AM

## 2012-05-31 NOTE — Progress Notes (Signed)
Physical Therapy Treatment Patient Details Name: [name] MRN: [id number] DOB: 01-23-1924 Today's Date: 05/31/2012 Time: 6295-2841 PT Time Calculation (min): 31 min  PT Assessment / Plan / Recommendation Comments on Treatment Session  No complanints of pain this session. Patient eager for CIR. Motivated to participate and return to PLOF.     Follow Up Recommendations  CIR     Does the patient have the potential to tolerate intense rehabilitation     Barriers to Discharge        Equipment Recommendations       Recommendations for Other Services    Frequency Min 3X/week   Plan Discharge plan remains appropriate;Frequency remains appropriate    Precautions / Restrictions Precautions Precautions: Fall Restrictions Weight Bearing Restrictions: Yes LLE Weight Bearing: Weight bearing as tolerated   Pertinent Vitals/Pain No complaints this session.    Mobility  Bed Mobility Bed Mobility: Sit to Supine Supine to Sit: 3: Mod assist;HOB elevated Sit to Supine: 3: Mod assist Details for Bed Mobility Assistance: UE and LE A for supine to sit.  Transfers Sit to Stand: 4: Min assist;With upper extremity assist;From bed;From chair/3-in-1 Stand to Sit: 4: Min assist;To chair/3-in-1;With upper extremity assist;To bed Details for Transfer Assistance: cues to  extend LLE  and anterior weight shift with stand to sit transfers Ambulation/Gait Ambulation/Gait Assistance: 4: Min assist Ambulation Distance (Feet): 30 Feet Assistive device: Rolling walker Ambulation/Gait Assistance Details: cues for RW management  and gait sequencing Gait Pattern: Step-to pattern Gait velocity: Decreased    Exercises Total Joint Exercises Gluteal Sets: AROM;Both;10 reps   PT Diagnosis:    PT Problem List:   PT Treatment Interventions:     PT Goals Acute Rehab PT Goals PT Goal: Supine/Side to Sit - Progress: Progressing toward goal PT Goal: Sit to Stand - Progress: Progressing toward goal PT  Goal: Stand to Sit - Progress: Progressing toward goal PT Goal: Ambulate - Progress: Progressing toward goal  Visit Information  Last PT Received On: 05/31/12 Assistance Needed: +1    Subjective Data      Cognition  Overall Cognitive Status: Appears within functional limits for tasks assessed/performed Arousal/Alertness: Awake/alert Orientation Level: Appears intact for tasks assessed Behavior During Session: [facility] for tasks performed    Balance     End of Session PT - End of Session Equipment Utilized During Treatment: Gait belt Activity Tolerance: Patient tolerated treatment well Patient left: in bed;with call bell/phone within reach;with family/visitor present;with nursing in room   GP     [name] 05/31/2012, 12:11 PM

## 2012-05-31 NOTE — Discharge Summary (Addendum)
Physician Discharge Summary  Patient ID: NAILANI FULL MRN: [id number] DOB/AGE: 11/05/1923 79 y.o.  Admit date: 05/26/2012 Discharge date: 05/31/2012  Primary Care Physician:  Danise Edge, MD  Discharge Diagnoses:    . Diabetes mellitus . HYPERLIPIDEMIA-MIXED . HYPERTENSION, UNSPECIFIED . HYPOTENSION, ORTHOSTATIC . Closed intertrochanteric fracture of left hip status post intramedullary nail, 05/27/2012 UTI with pyelonephritis  Consults: Orthopedics, Dr. Dorthula Nettles  Discharge Medications:   Medication List     As of 05/31/2012 11:51 AM    STOP taking these medications         cefdinir 300 MG capsule   Commonly known as: OMNICEF      TAKE these medications         acetaminophen 500 MG tablet   Commonly known as: TYLENOL   Take 500 mg by mouth daily as needed. For pain      amLODipine 5 MG tablet   Commonly known as: NORVASC   Take 5 mg by mouth daily.      aspirin 81 MG tablet   Take 81 mg by mouth daily.      DULERA 200-5 MCG/ACT Aero   Generic drug: mometasone-formoterol   Inhale 2 puffs into the lungs 3 (three) times daily as needed. For shortness of breath      HYDROcodone-acetaminophen 5-325 MG per tablet   Commonly known as: NORCO/VICODIN   Take 1-2 tablets by mouth every 6 (six) hours as needed for pain. MAXIMUM TOTAL ACETAMINOPHEN DOSE IS 4000 MG PER DAY      levothyroxine 112 MCG tablet   Commonly known as: SYNTHROID, LEVOTHROID   Take 112 mcg by mouth daily.      metFORMIN 1000 MG (MOD) 24 hr tablet   Commonly known as: GLUMETZA   Take 1 tablet (1,000 mg total) by mouth 2 (two) times daily with a meal.      montelukast 10 MG tablet   Commonly known as: SINGULAIR   Take 10 mg by mouth daily.      nitroGLYCERIN 0.4 MG SL tablet   Commonly known as: NITROSTAT   Place 1 tablet (0.4 mg total) under the tongue every 5 (five) minutes x 3 doses as needed for chest pain.      PARoxetine 10 MG tablet   Commonly known as: PAXIL   Take 10 mg by  mouth every morning.      rosuvastatin 20 MG tablet   Commonly known as: CRESTOR   Take 20 mg by mouth at bedtime.      sennosides-docusate sodium 8.6-50 MG tablet   Commonly known as: SENOKOT-S   Take 1 tablet by mouth daily.      warfarin 5 MG tablet   Commonly known as: COUMADIN   Take 1 tablet (5 mg total) by mouth daily.        Inpatient medications: Ciprofloxacin 500mg  daily x 1week                                          Coumadin per pharmacy   Brief H and P: For complete details please refer to admission H and P, but in brief  79 y.o. female who presented after a fall from a standing position that occurred earlier on the day of admission on 05/26/2012. Patient was out shopping, stood up and started walking when she became dizzy, she did not pass out but did fall  onto her left side. She has had pain in her L hip since the fall movement makes it worse, rest makes it better. Patient has known history of orthostatic hypotension and is not supposed to get up and start moving quickly like she did today per her family.  In the ED CT scan demonstrated an intertrochanteric fracture of the proximal left femur with comminution of the greater trochanter.   Hospital Course:  Brief 79 year old female who was admitted with intertrochanteric fracture of the proximal left femur after a fall. 1. Left intertrochanteric hip fracture, orthopedic was consulted and patient underwent intertrochanteric IM nailing on 05/27/2012.  Continue Incentive spirometry. Pain is currently controlled for DVT progress as patient is on Coumadin per pharmacy. Hold Coumadin today for INR of 4.32.  2. Mild hypotension-possibly a result of surgery/opiates. Resolved 3. Pyelonephritis -was being Rx for past 3 weeks-developed fever 1/14-started Ciprofloxacin 400 IV bid. Transitioned to oral ciprofloxacin for at least a week. Check UA prior to the final dc from inpatient rehab. 4.  Extensive CAD history-continue  perioperative aspirin 81 mg-apparently not on beta blocker or ACE inhibitor.  5. DM- blood sugars controlled continue Mod SSI coverage, takes metformin at home. 6. Bradycardia necessitating pacemaker placed 6/09-stable at present 7. H/o Myelodysplasia-stable. Takes Arenesp chronically as out-patient-she was transfused due to a hemoglobin of 7.2 on 05/29/12-repeat hemoglobin 10.8 on 05/30/2012 8. Chronic TCP-probably secondary to myelodysplasia  9. H/o asymptomatic ICA stenosis r side with Aneurysm L side-needs out-patient follow-up-used to be on Plavix for this. Not is not for unclear reason. 10. H.o Bladder cancer 2003-For Urology f/u with Dr. Isabel Caprice as out-patient. 11. Depression-continue Paxil 10mg  daily 12. Hld-Continue Lipitor 40 mg daily  Day of Discharge BP 135/62  Pulse 70  Temp 98.9 F (37.2 C) (Oral)  Resp 20  Ht 5' (1.524 m)  Wt 47.5 kg (104 lb 11.5 oz)  BMI 20.45 kg/m2  SpO2 98%  Physical Exam: General: Alert and awake oriented x3 not in any acute distress. HEENT: anicteric sclera, pupils reactive to light and accommodation CVS: S1-S2 clear no murmur rubs or gallops Chest: clear to auscultation bilaterally, no wheezing rales or rhonchi Abdomen: soft nontender, nondistended, normal bowel sounds, no organomegaly Extremities: no cyanosis, clubbing or edema noted bilaterally Neuro: Cranial nerves II-XII intact, no focal neurological deficits   The results of significant diagnostics from this hospitalization (including imaging, microbiology, ancillary and laboratory) are listed below for reference.    LAB RESULTS: Basic Metabolic Panel:  Lab 05/29/12 9147 05/28/12 0930  NA 138 136  K 4.1 4.9  CL 104 101  CO2 25 23  GLUCOSE 135* 148*  BUN 17 16  CREATININE 1.28* 1.29*  CALCIUM 8.5 8.5  MG -- --  PHOS -- --   Liver Function Tests: No results found for this basename: AST:2,ALT:2,ALKPHOS:2,BILITOT:2,PROT:2,ALBUMIN:2 in the last 168 hours No results found for this  basename: LIPASE:2,AMYLASE:2 in the last 168 hours No results found for this basename: AMMONIA:2 in the last 168 hours CBC:  Lab 05/30/12 0530 05/29/12 2137  WBC 11.8* 12.2*  NEUTROABS -- 9.1*  HGB 10.8* 10.8*  HCT 32.9* 32.1*  MCV 92.2 --  PLT 89* 85*   Cardiac Enzymes: No results found for this basename: CKTOTAL:2,CKMB:2,CKMBINDEX:2,TROPONINI:2 in the last 168 hours BNP: No components found with this basename: POCBNP:2 CBG:  Lab 05/31/12 1131 05/31/12 0714  GLUCAP 132* 103*    Significant Diagnostic Studies:  Dg Chest 1 View  05/26/2012  *RADIOLOGY REPORT*  Clinical Data: Fall.  Cough and SOB.  CHEST - 1 VIEW  Comparison: 03/27/2012  Findings: Left chest wall pacer device is noted with lead in the right atrial appendage and right ventricle.  Normal heart size.  No pleural effusion identified.  Chronic interstitial lung disease is again noted and appears similar to previous exam.  No superimposed airspace consolidation noted.  IMPRESSION:  1.  No acute cardiopulmonary abnormalities. 2.  Interstitial lung disease.   Original Report Authenticated By: Signa Kell, M.D.    Dg Hip Complete Left  05/26/2012  *RADIOLOGY REPORT*  Clinical Data: Fall, left hip pain  LEFT HIP - COMPLETE 2+ VIEW  Comparison: None.  Findings: Hips are located.  No evidence of pelvic fracture  or sacral fracture.  Dedicated view of the left hip demonstrates a subtle cortical lucency/disruption at the greater trochanter.  IMPRESSION:   Cortical destruction of the of the greater trochanter. Differential includes degenerative change, osteomyelitis, or fracture.  Consider CT of the pelvis if concern for fracture.  Findings discussed with Schuben PA on 05/26/2012 1735 hours   Original Report Authenticated By: Genevive Bi, M.D.    Dg Femur Left  05/26/2012  *RADIOLOGY REPORT*  Clinical Data: Fall, left leg pain  LEFT FEMUR - 2 VIEW  Comparison: .  None  Findings: No evidence of fracture of the distal left femur.  Vascular calcifications noted.  IMPRESSION: No distal left femur fracture.   Original Report Authenticated By: Genevive Bi, M.D.    Ct Hip Left Wo Contrast  05/26/2012  *RADIOLOGY REPORT*  Clinical Data: Left hip pain secondary to a fall.  CT OF THE LEFT HIP WITHOUT CONTRAST  Technique:  Multidetector CT imaging was performed according to the standard protocol. Multiplanar CT image reconstructions were also generated.  Comparison: Radiographs dated 05/26/2012  Findings: There is a comminuted intertrochanteric fracture of the proximal left femur.  The superior aspect of the greater trochanter is comminuted.  Minimal osteophytes on the acetabulum.  IMPRESSION: Intertrochanteric fracture of the proximal left femur.  Comminution of the greater trochanter.   Original Report Authenticated By: Francene Boyers, M.D.      Disposition and Follow-up: Discharge Orders    Future Appointments: Provider: Department: Dept Phone: Center:   06/06/2012 12:15 PM Krista Blue [facility] MEDICAL ONCOLOGY [phone] None   06/06/2012 12:45 PM [facility] MED[facility] ONCOL[facility] [phone] None   06/12/2012 10:45 AM Duke Salvia, MD [facility] [place]) [phone] [facility]   06/27/2012 11:15 AM [facility] MEDICAL ONCOLOGY [phone] None   06/27/2012 11:45 AM [facility] Nurse [place] [facility] MEDICAL ONCOLOGY [phone] None   07/18/2012 11:15 AM Beverely Pace [facility] MED[facility] ONCOLOGY [phone] None   07/18/2012 11:45 AM [facility] Nurse [place] [facility] MEDICAL ONCOLOGY [phone] None   08/08/2012 11:15 AM Krista Blue [facility] MEDICAL ONCOLOGY [phone] None   08/08/2012 11:45 AM [facility] Nurse [place] [facility] MEDICAL ONCOLOGY [phone] None   08/29/2012 11:15 AM Krista Blue [facility] MEDICAL  ONCOLOGY [phone] None   08/29/2012 11:45 AM [facility] In[facility] Nurse [place] [facility] MEDICAL ONCOLOGY [phone] None   09/19/2012 11:15 AM Krista Blue [facility] MEDICAL ONCOLOGY [phone] None   09/19/2012 11:45 AM [facility] Inj Nurse  [facility] [phone] None   10/10/2012 11:15 AM Krista Blue [facility]  MEDICAL ONCOLOGY [phone] None   10/10/2012 11:45 AM [facility] Inj Nurse [place] [facility] MEDICAL ONCOLOGY [phone] None   10/30/2012 2:00 PM Beverely Pace [facility] MEDICAL ONCOLOGY [phone] None   10/30/2012 2:30 PM Levert Feinstein, MD [facility] MEDICAL ONCOLOGY [phone] None   10/30/2012 3:30 PM [facility] Nurse [place] [facility] MEDICAL ONCOLOGY [phone] None     Future Orders Please Complete By Expires   Weight bearing as tolerated          DISPOSITION: To inpatient rehabilitation  DIET: Carb modified diet ACTIVITY: As tolerated  DISCHARGE FOLLOW-UP Follow-up Information    Follow up with Eulas Post, MD. Schedule an appointment as soon as possible for a visit in 2 weeks.   Contact information:   [address] [place] [address] [phone]          Time spent on Discharge: 40 minutes Signed:   Keigen Caddell M.D. [facility] 05/31/2012, 11:51 AM Pager: [phone]

## 2012-05-31 NOTE — Progress Notes (Signed)
CARE MANAGEMENT NOTE 05/31/2012  Patient:  [name], [name]   Account Number:  [id number]  Date Initiated:  05/29/2012  Documentation initiated by:  Vance Peper  Subjective/Objective Assessment:   79 yr old female s/p left hip IM Nail.     Action/Plan:   CM following. Patient may go to SNF.Waiting for PT notes.   Anticipated DC Date:  05/31/2012   Anticipated DC Plan:  IP REHAB FACILITY      DC Planning Services  CM consult      Choice offered to / List presented to:             Status of service:  Completed, signed off Medicare Important Message given?   (If response is "NO", the following Medicare IM given date fields will be blank) Date Medicare IM given:   Date Additional Medicare IM given:    Discharge Disposition:  IP REHAB FACILITY  Per UR Regulation:    If discussed at Long Length of Stay Meetings, dates discussed:    Comments:

## 2012-05-31 NOTE — Progress Notes (Signed)
ANTICOAGULATION CONSULT NOTE - Follow Up Consult  Pharmacy Consult for coumadin Indication: VTE prophylaxis  Allergies  Allergen Reactions  . Codeine Anaphylaxis and Swelling  . Penicillins Anaphylaxis and Swelling  . Sulfonamide Derivatives Anaphylaxis and Swelling   Labs:  Basename 05/31/12 0805 05/30/12 0530 05/29/12 2137 05/29/12 0630  HGB -- 10.8* 10.8* --  HCT -- 32.9* 32.1* 22.2*  PLT -- 89* 85* 84*  APTT -- -- -- --  LABPROT 38.7* 35.1* -- 16.2*  INR 4.32* 3.78* -- 1.33  HEPARINUNFRC -- -- -- --  CREATININE -- -- -- 1.28*  CKTOTAL -- -- -- --  CKMB -- -- -- --  TROPONINI -- -- -- --    The CrCl is unknown because both a height and weight (above a minimum accepted value) are required for this calculation.  Assessment: Anticoag: 79 yo female on Coumadin for VTE px s/p ortho surgery. INR is currently supra-therapeutic, increased despite held dose yesterday (4.32). Noted on Cipro concurrently, which may increase INR slightly. CBC stable as of 1/14- no overt bleeding reported. Coumadin education was done 1/12 and documented in [facility].  ID: Ancef post op-now on Cipro (day #2), afeb, WBC 11.8. No cx data Card: hx of CAD, HTN, and hyperlipidemia. ASA, lipitor. VSS Pulm: asthma on dulera Endo/GI: hx of hypothyroidism on synthroid. Hx of DM on SSI. CBG stable Hem/Onc: hx of anemia H/H 10.8/32.9; Plt 89 (2 units PRBCs 1/13) Renal: SCr 1.28 as of 1/13, lytes were ok. Home meds: metformin not yet resumed Best practice: coumadin  Goal of Therapy:  INR 2-3  Plan:  - No Coumadin today, Lovenox discontinued -daily INR  Herby Abraham, Pharm.D. [phone] 05/31/2012 6:09 PM

## 2012-06-01 ENCOUNTER — Inpatient Hospital Stay (HOSPITAL_COMMUNITY): Payer: Medicare Other | Admitting: Physical Therapy

## 2012-06-01 ENCOUNTER — Inpatient Hospital Stay (HOSPITAL_COMMUNITY): Payer: Medicare Other

## 2012-06-01 ENCOUNTER — Inpatient Hospital Stay (HOSPITAL_COMMUNITY): Payer: Medicare Other | Admitting: Occupational Therapy

## 2012-06-01 DIAGNOSIS — J449 Chronic obstructive pulmonary disease, unspecified: Secondary | ICD-10-CM

## 2012-06-01 DIAGNOSIS — R296 Repeated falls: Secondary | ICD-10-CM

## 2012-06-01 DIAGNOSIS — F329 Major depressive disorder, single episode, unspecified: Secondary | ICD-10-CM

## 2012-06-01 DIAGNOSIS — D62 Acute posthemorrhagic anemia: Secondary | ICD-10-CM

## 2012-06-01 DIAGNOSIS — N289 Disorder of kidney and ureter, unspecified: Secondary | ICD-10-CM

## 2012-06-01 DIAGNOSIS — S72143A Displaced intertrochanteric fracture of unspecified femur, initial encounter for closed fracture: Secondary | ICD-10-CM

## 2012-06-01 LAB — COMPREHENSIVE METABOLIC PANEL
AST: 28 U/L (ref 0–37)
Albumin: 2.3 g/dL — ABNORMAL LOW (ref 3.5–5.2)
Calcium: 9.2 mg/dL (ref 8.4–10.5)
Chloride: 102 mEq/L (ref 96–112)
Creatinine, Ser: 1.32 mg/dL — ABNORMAL HIGH (ref 0.50–1.10)

## 2012-06-01 LAB — CBC WITH DIFFERENTIAL/PLATELET
Basophils Absolute: 0 10*3/uL (ref 0.0–0.1)
Basophils Relative: 0 % (ref 0–1)
Eosinophils Relative: 5 % (ref 0–5)
HCT: 31.8 % — ABNORMAL LOW (ref 36.0–46.0)
MCHC: 32.7 g/dL (ref 30.0–36.0)
Monocytes Absolute: 1.4 10*3/uL — ABNORMAL HIGH (ref 0.1–1.0)
Neutro Abs: 7.5 10*3/uL (ref 1.7–7.7)
Platelets: 117 10*3/uL — ABNORMAL LOW (ref 150–400)
RDW: 17.1 % — ABNORMAL HIGH (ref 11.5–15.5)

## 2012-06-01 LAB — GLUCOSE, CAPILLARY
Glucose-Capillary: 132 mg/dL — ABNORMAL HIGH (ref 70–99)
Glucose-Capillary: 99 mg/dL (ref 70–99)

## 2012-06-01 LAB — PROTIME-INR: INR: 3.03 — ABNORMAL HIGH (ref 0.00–1.49)

## 2012-06-01 MED ORDER — WARFARIN SODIUM 1 MG PO TABS
1.0000 mg | ORAL_TABLET | Freq: Once | ORAL | Status: AC
  Administered 2012-06-01: 1 mg via ORAL
  Filled 2012-06-01: qty 1

## 2012-06-01 NOTE — Progress Notes (Signed)
ANTICOAGULATION CONSULT NOTE - Follow Up Consult  Pharmacy Consult for coumadin Indication: VTE prophylaxis  Allergies  Allergen Reactions  . Codeine Anaphylaxis and Swelling  . Penicillins Anaphylaxis and Swelling  . Sulfonamide Derivatives Anaphylaxis and Swelling    Patient Measurements: Weight: 105 lb 13.1 oz (48 kg) Heparin Dosing Weight:   Vital Signs: Temp: 97.5 F (36.4 C) (01/16 0653) Temp src: Oral (01/16 0653) BP: 178/88 mmHg (01/16 1028) Pulse Rate: 63  (01/16 0653)  Labs:  Basename 06/01/12 0710 05/31/12 0805 05/30/12 0530 05/29/12 2137  HGB 10.4* -- 10.8* --  HCT 31.8* -- 32.9* 32.1*  PLT 117* -- 89* 85*  APTT -- -- -- --  LABPROT 29.8* 38.7* 35.1* --  INR 3.03* 4.32* 3.78* --  HEPARINUNFRC -- -- -- --  CREATININE 1.32* -- -- --  CKTOTAL -- -- -- --  CKMB -- -- -- --  TROPONINI -- -- -- --    The CrCl is unknown because both a height and weight (above a minimum accepted value) are required for this calculation.   Medications:  Scheduled:    . amLODipine  5 mg Oral Daily  . aspirin  81 mg Oral Daily  . atorvastatin  40 mg Oral q1800  . ciprofloxacin  250 mg Oral BID  . insulin aspart  0-9 Units Subcutaneous TID WC  . levothyroxine  112 mcg Oral QAC breakfast  . mometasone-formoterol  2 puff Inhalation BID  . montelukast  10 mg Oral Daily  . PARoxetine  10 mg Oral Daily  . senna-docusate  2 tablet Oral QHS  . warfarin  1 mg Oral ONCE-1800  . Warfarin - Pharmacist Dosing Inpatient   Does not apply q1800  . [DISCONTINUED] senna  1 tablet Oral BID   Infusions:    Assessment: 79 yo female s/p ortho surgery is currently on supratheraputic coumadin. INR is down to 3.03 after doses held.  Expecting INR to go down more tom.  Currently on cipro Goal of Therapy:  INR 2-3    Plan:  1) Coumadin 1mg  po x1 2) INR in am  [name], [name] 06/01/2012,11:34 AM

## 2012-06-01 NOTE — Progress Notes (Signed)
Physical Therapy Session Note  Patient Details  Name: [name] MRN: [id number] Date of Birth: 1923-07-07  Short Term Goals: Week 1:  PT Short Term Goal 1 (Week 1): Pt will demonstrate bed mobiliity with S PT Short Term Goal 2 (Week 1): Pt will perform functional transfers with S PT Short Term Goal 3 (Week 1): Pt will gait x 100' with S PT Short Term Goal 4 (Week 1): Pt will demonstrate dynamic standing balance with S  Session 1 Today's Date: 06/01/2012 Time: 1020-1105 Time Calculation (min): 45 min (10 min . Missed due to nursing care)  Skilled Therapeutic Interventions/Progress Updates:  WC propulsion for UE strengthening and increased endurance in controlled environment 100' with supervision.  Sit/stand transfers to/from WC/RW with min A.  Ambulation for increased endurance and functional mobility with RW and supervision on tiled flooring 6' with cuing for proximity to walker.  Stand/sit transfer RW/mat table with supervision; min A for sit/supine transfer.  LE strengthening in supine: 20 reps ankle pumps, 10 reps quad sets, glut sets, heel slides, TKE, hip a/a.  Brief rest break between sets.  Supine/sit transfer with min A.  Patient performed stand pivot to [facility] with supervision, wheeled back to room in [facility] by staff.  General: Amount of Missed PT Time (min): 10 Minutes Missed Time Reason: Nursing care Pain: Pain Assessment Pain Assessment: 0-10 Pain Score:   5 Pain Type: Surgical pain Pain Location: Hip Pain Orientation: Left Pain Descriptors: Sore Pain Onset: With Activity Pain Intervention(s): RN made aware Multiple Pain Sites: No  Session 2 Today's Date: 06/01/2012 Time: 1345-1420 Time Calculation (min): 35 min  Skilled Therapeutic Interventions/Progress Updates:   Sit/stand transfers to/from WC/RW with min A.  Ambulation for increased endurance and functional mobility with RW and supervision on tiled flooring 75' x2 bouts with cuing for proximity to walker.  Seated  rest break between bouts.  Stair training with min A up/down three 4" steps x2 bouts.  Brief rest break between bouts.  Stand pivot transfer from [facility] to EOB at end of session; min A to elevate left LE in transfer to supine.  Patient positioned in bed at end of session, call bell within reach.       See FIM for current functional status  Therapy/Group: Individual Therapy  Rexene Agent 06/01/2012, 12:24 PM

## 2012-06-01 NOTE — Plan of Care (Signed)
Problem: RH SKIN INTEGRITY Goal: RH STG ABLE TO PERFORM INCISION/WOUND CARE W/ASSISTANCE STG Able To Perform Incision/Wound Care With min Assistance.  Incision with steristrips, and minimal amount of drainage at this time

## 2012-06-01 NOTE — Plan of Care (Signed)
Problem: RH PAIN MANAGEMENT Goal: RH STG PAIN MANAGED AT OR BELOW PT'S PAIN GOAL 3 or less  Outcome: Progressing Pt controlled with Tylenol 650 prn q 4hrs at this time

## 2012-06-01 NOTE — Evaluation (Signed)
Occupational Therapy Assessment and Plan & Session Note  Patient Details  Name: [name] MRN: [id number] Date of Birth: Feb 21, 1924  OT Diagnosis: abnormal posture, acute pain and muscle weakness (generalized) Rehab Potential: Rehab Potential: Excellent ELOS: 10-14 days   Today's Date: 06/01/2012  Problem List:  Patient Active Problem List  Diagnosis  . HYPOTHYROIDISM  . HYPERLIPIDEMIA-MIXED  . HYPERTENSION, UNSPECIFIED  . SECOND DEGREE AV BLOCK, MOBITZ II  . VENTRICULAR TACHYCARDIA  . CAROTID ARTERY STENOSIS, WITHOUT INFARCTION  . PVD  . HYPOTENSION, ORTHOSTATIC  . RENAL FAILURE, CHRONIC  . Myelodysplastic syndrome  . NSVT (nonsustained ventricular tachycardia)  . Dizziness  . CAD  . Diabetes mellitus  . Allergic state  . Insomnia  . Recurrent UTI  . Bladder cancer  . Sinusitis  . Anemia  . Arthritis  . Cataract extraction status of right eye  . Anxiety and depression  . Pacemaker-Medtronic  . Bronchitis, acute  . Headache  . Atypical chest pain  . Closed intertrochanteric fracture of left hip  . Hip fracture    Past Medical History:  Past Medical History  Diagnosis Date  . CAD (coronary artery disease)     a. 10/2007 PCI Diag w/ 2.25 x 8mm Taxus Atom DES.;  b. 09/2011 Lexi MV: EF 69%, no ischemia/infarct.  Marland Kitchen PVD (peripheral vascular disease)   . Carotid artery stenosis   . HTN (hypertension)   . Hyperlipidemia   . Hypothyroidism   . CKD (chronic kidney disease), stage III   . Diabetes mellitus 07/16/2011  . Allergic state 07/16/2011  . Insomnia 07/16/2011  . Recurrent UTI 07/16/2011  . Asthma     childhood, recurrent at 63  . Myelodysplastic syndrome 04/02/2011  . Sinusitis 07/18/2011  . Anemia 07/18/2011  . Arthritis 07/18/2011  . Cataract extraction status of right eye 08/18/2011  . Anxiety and depression 08/18/2011  . Bronchitis, acute 12/20/2011  . Headache 03/02/2012  . AV heart block     a. 10/2007 MDT Versa dual chamber PPM.  . Bladder cancer 12/2002  .  Closed intertrochanteric fracture of left hip 05/27/2012   Past Surgical History:  Past Surgical History  Procedure Date  . Cholecystectomy   . Pcm - medtronic   . Abdominal hysterectomy   . Shoulder open rotator cuff repair   . Foot surgery     bunionectomy  . Intramedullary (im) nail intertrochanteric 05/27/2012    Procedure: INTRAMEDULLARY (IM) NAIL INTERTROCHANTRIC;  Surgeon: Eulas Post, MD;  Location: [facility] OR;  Service: Orthopedics;  Laterality: Left;    Clinical Impression: [name] is a 79 y.o. right-handed female with history of hypertension, chronic kidney insufficiency with baseline creatinine 1.4 and diabetes mellitus with peripheral neuropathy. Patient with history of multiple falls most recently October 2012 resulting in knee fracture and skilled nursing facility admission. Admitted 05/26/2012 after a fall while patient was at the hair salon and she stood up starting to walk when she became dizzy. She denied any loss of consciousness. X-rays and imaging revealed left intertrochanteric hip fracture. Underwent intramedullary nail 05/27/2012 per Dr. Dion Saucier. Advised weightbearing as tolerated. Postoperative pain management. Acute blood loss anemia after surgery with hemoglobin 7.2 and transfused 2 units of packed red blood cells with latest hemoglobin 10.8. Placed on Coumadin for DVT prophylaxis and subcutaneous Lovenox until INR greater than 2.00. Physical therapy evaluation completed 05/28/2012 with recommendations of physical medicine rehabilitation consult to consider inpatient rehabilitation services. Patient was felt to be a candidate for inpatient rehabilitation  services and was admitted for comprehensive rehabilitation program. Patient transferred to CIR on 05/31/2012 .    Patient currently requires supervision -> moderate assistance with basic self-care skills and IADL secondary to muscle weakness and muscle joint tightness and decreased sitting balance, decreased standing  balance, decreased postural control and decreased balance strategies.  Prior to hospitalization, patient could complete ADLs & IADLs independently.   Patient will benefit from skilled intervention to increase independence with basic self-care skills prior to discharge home independently.  Anticipate patient will require intermittent supervision and additional OT is TBD.   OT - End of Session Activity Tolerance: Tolerates 10 - 20 min activity with multiple rests Endurance Deficit: Yes OT Assessment Rehab Potential: Excellent Barriers to Discharge: Decreased caregiver support Barriers to Discharge Comments: patient lives alone, son and daughter work OT Plan OT Intensity: Minimum of 1-2 x/day, 45 to 90 minutes OT Frequency: 5 out of 7 days OT Duration/Estimated Length of Stay: 10-14 days OT Treatment/Interventions: Metallurgist training;Community reintegration;Discharge planning;DME/adaptive equipment instruction;Functional mobility training;Neuromuscular re-education;Pain management;Patient/family education;Psychosocial support;Self Care/advanced ADL retraining;Skin care/wound managment;Splinting/orthotics;Therapeutic Activities;Therapeutic Exercise;UE/LE Strength taining/ROM;Wheelchair propulsion/positioning;UE/LE Coordination activities OT Recommendation Patient destination: Home Follow Up Recommendations: Home health OT Equipment Details: none at this time, patient states she has a BSC and tub transfer bench  Precautions/Restrictions  Precautions Precautions: Fall Precaution Comments: patient with multiple falls in the past  Restrictions Weight Bearing Restrictions: Yes LLE Weight Bearing: Weight bearing as tolerated  General Chart Reviewed: Yes Family/Caregiver Present: No  Vital Signs Therapy Vitals Temp: 97.5 F (36.4 C) Temp src: Oral Pulse Rate: 63  Resp: 18  BP: 184/81 mmHg Oxygen Therapy SpO2: 97 % O2 Device: None (Room air)  Pain Pain Assessment Pain  Assessment: 0-10 Pain Score:   4 Pain Type: Acute pain;Surgical pain Pain Location: Hip Pain Orientation: Left Pain Descriptors: Sore Pain Intervention(s): RN made aware;Refused  Home Living/Prior Functioning Home Living Lives With: Alone Available Help at Discharge: Family;Available PRN/intermittently (son and daughter both work ) Type of Home: House Home Access: Stairs to enter Secretary/administrator of Steps: 2 from driveway->porch, 1 porch ->sunroom Entrance Stairs-Rails: Left Home Layout: One level Bathroom Shower/Tub: Forensic scientist: Standard Bathroom Accessibility: Yes How Accessible: Accessible via walker Home Adaptive Equipment: Bedside commode/3-in-1;Tub transfer bench;Walker - rolling;Hand-held shower hose IADL History Homemaking Responsibilities: Yes Meal Prep Responsibility: Primary Laundry Responsibility: Primary Cleaning Responsibility: Secondary Shopping Responsibility: Primary Homemaking Comments: patient has someone come into clean house Current License: Yes 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, did some substitution for schools Leisure and Hobbies: word puzzles, cross stitch  Prior Function Level of Independence: Independent with basic ADLs;Independent with homemaking with ambulation;Independent with gait;Independent with transfers Able to Take Stairs?: Yes Driving: Yes  ADL - See FIM  Vision/Perception  Vision - History Baseline Vision: No visual deficits Patient Visual Report: No change from baseline Vision - Assessment Vision Assessment: Vision not tested Perception Perception: Within Functional Limits Praxis Praxis: Intact   Cognition Overall Cognitive Status: Appears within functional limits for tasks assessed Arousal/Alertness: Awake/alert Orientation Level: Oriented X4 Memory: Appears intact Awareness: Appears intact Problem Solving: Appears intact Safety/Judgment: Appears  intact  Sensation Sensation Additional Comments: bilateral UEs appear intact Coordination Gross Motor Movements are Fluid and Coordinated: Yes Fine Motor Movements are Fluid and Coordinated: Yes  Motor - See Evaluation Navigator  Trunk/Postural Assessment  - See Evaluation Navigator  Balance - See Evaluation Navigator  Extremity/Trunk Assessment - See Evaluation Navigator RUE Assessment RUE Assessment: Within Functional Limits (  can benefit from strengthening exercises ) LUE Assessment LUE Assessment: Within Functional Limits (can benefit from strengthening exercises; rotator cuff surg)  See FIM for current functional status Refer to Care Plan for Long Term Goals  Recommendations for other services: None  Discharge Criteria: Patient will be discharged from OT if patient refuses treatment 3 consecutive times without medical reason, if treatment goals not met, if there is a change in medical status, if patient makes no progress towards goals or if patient is discharged from hospital.  The above assessment, treatment plan, treatment alternatives and goals were discussed and mutually agreed upon: by patient  ------------------------------------------------------------------------------------------  SESSION NOTE  0730-0830 - 60 Minutes Individual Therapy Patient with 4/10 complaints of pain in L hip, patient refused medication at this time Initial 1:1 occupational therapy evaluation completed. Focused skilled intervention on bed mobility, sit/stands, dynamic standing balance/tolerance/endurance, UB/LB dressing, and overall activity tolerance/endurance. At end of session patient left seated edge of bed with call bell, phone, and breakfast tray.   [name] 06/01/2012, 8:49 AM

## 2012-06-01 NOTE — Progress Notes (Signed)
Patient information reviewed and entered into eRehab system by Wyat Infinger, RN, CRRN, PPS Coordinator.  Information including medical coding and functional independence measure will be reviewed and updated through discharge.     Per nursing patient was given "Data Collection Information Summary for Patients in Inpatient Rehabilitation Facilities with attached "Privacy Act Statement-Health Care Records" upon admission.  

## 2012-06-01 NOTE — Progress Notes (Signed)
Subjective/Complaints:   Objective: Vital Signs: Blood pressure 184/81, pulse 63, temperature 97.5 F (36.4 C), temperature source Oral, resp. rate 18, weight 48 kg (105 lb 13.1 oz), SpO2 97.00%. No results found.  Basename 06/01/12 0710 05/30/12 0530  WBC 10.4 11.8*  HGB 10.4* 10.8*  HCT 31.8* 32.9*  PLT 117* 89*   No results found for this basename: NA:2,K:2,CL:2,CO:2,GLUCOSE:2,BUN:2,CREATININE:2,CALCIUM:2 in the last 72 hours CBG (last 3)   Basename 05/31/12 2123 05/31/12 1632 05/31/12 1131  GLUCAP 132* 104* 132*    Wt Readings from Last 3 Encounters:  05/31/12 48 kg (105 lb 13.1 oz)  05/29/12 47.5 kg (104 lb 11.5 oz)  05/29/12 47.5 kg (104 lb 11.5 oz)    Physical Exam:  Constitutional: She is oriented to person, place, and time. NAD  HENT: oral mucosa pink and moist  Head: Normocephalic.  Eyes:  Pupils round and reactive to light  Neck: Normal range of motion. Neck supple. No thyromegaly present.  Cardiovascular: Normal rate and regular rhythm. No M,R,G  Pulmonary/Chest: Effort normal and breath sounds normal. No respiratory distress. No wheezes, rales or rhonchi  Abdominal: Soft. Bowel sounds are normal. She exhibits no distension.  Musculoskeletal: She exhibits no edema.  Left hip appropriately tender. Dressed with minimal serosanginous drainage from either site Neurological: She is alert and oriented to person, place, and time.  Follows full commands. fxnl UE strength. No sensory deficits except for mild LT and PP loss in the feet. Right LE 3/5 prox to 4+/5 distally/ LLE is 1-2 proximally but 4/5 distally at ankle.  Skin:     Psychiatric: She has a normal mood and affect. Her behavior is normal. Judgment and thought content normal    Assessment/Plan: 1. Functional deficits secondary to left IT hip fx s/p IMN which require 3+ hours per day of interdisciplinary therapy in a comprehensive inpatient rehab setting. Physiatrist is providing close team supervision and  24 hour management of active medical problems listed below. Physiatrist and rehab team continue to assess barriers to discharge/monitor patient progress toward functional and medical goals. FIM:                   Comprehension Comprehension Mode: Auditory Comprehension: 5-Understands complex 90% of the time/Cues < 10% of the time  Expression Expression Mode: Verbal Expression: 5-Expresses complex 90% of the time/cues < 10% of the time  Social Interaction Social Interaction: 6-Interacts appropriately with others with medication or extra time (anti-anxiety, antidepressant).  Problem Solving Problem Solving: 5-Solves complex 90% of the time/cues < 10% of the time  Memory Memory: 5-Recognizes or recalls 90% of the time/requires cueing < 10% of the time  Medical Problem List and Plan:  1. Left intertrochanteric hip fracture status post intramedullary nail 05/27/2012  2. DVT Prophylaxis/Anticoagulation: Coumadin for DVT prophylaxis. Latest INR 3.03 --adjustment per pharmacy 3. Pain Management: MSIR 7.5 mg every 6 hours as needed pain and Robaxin as needed. Monitor with increased mobility  4. Mood: Paxil 10 mg daily. Provide emotional support and positive reinforcement  5. Neuropsych: This patient is capable of making decisions on his/her own behalf.  6. Postoperative anemia. Transfused 2 units of packed red blood cells 05/29/2012. Followup CBC today at 10.4.  7. hypertension. Norvasc 5 mg daily. Monitor with increased mobility  8. History of asthma.Dulera 2 puffs twice a day, Singulair 10 mg daily. Patient with no complaints of shortness of breath at present 9. Hypothyroidism. Synthroid  10. Diabetes mellitus. Latest hemoglobin A1c of 6.5. Continue sliding  scale insulin. Check blood sugars a.c. and at bedtime. Patient on Glucophage 1000 mg twice a day prior to admission. Sugars under control thus far 11. Chronic renal insufficiency. Baseline creatinine 1.4. Followup chemistries    12. CAD. Continue aspirin therapy. Patient no complaints of chest pain  13. Hyperlipidemia. Lipitor  14. Recurrent UTI. Placed empirically on Cipro 05/30/2012 with urinalysis study negative nitrite and 7-10 WBC. Patient denies dysuria. i don't believe ucx done --will order 15. Constipation: augment bowel regimen.  LOS (Days) 1 A FACE TO FACE EVALUATION WAS PERFORMED  [name] T 06/01/2012 8:08 AM

## 2012-06-01 NOTE — Evaluation (Signed)
Physical Therapy Assessment and Plan  Patient Details  Name: [name] MRN: [id number] Date of Birth: [birth year]  PT Diagnosis: Abnormality of gait, Difficulty walking, Edema, Muscle weakness and Pain in joint Rehab Potential: Good ELOS: 10-12 days   Today's Date: 06/01/2012 Time: 7829-5621 Time Calculation (min): 60 min  Problem List:  Patient Active Problem List  Diagnosis  . HYPOTHYROIDISM  . HYPERLIPIDEMIA-MIXED  . HYPERTENSION, UNSPECIFIED  . SECOND DEGREE AV BLOCK, MOBITZ II  . VENTRICULAR TACHYCARDIA  . CAROTID ARTERY STENOSIS, WITHOUT INFARCTION  . PVD  . HYPOTENSION, ORTHOSTATIC  . RENAL FAILURE, CHRONIC  . Myelodysplastic syndrome  . NSVT (nonsustained ventricular tachycardia)  . Dizziness  . CAD  . Diabetes mellitus  . Allergic state  . Insomnia  . Recurrent UTI  . Bladder cancer  . Sinusitis  . Anemia  . Arthritis  . Cataract extraction status of right eye  . Anxiety and depression  . Pacemaker-Medtronic  . Bronchitis, acute  . Headache  . Atypical chest pain  . Closed intertrochanteric fracture of left hip  . Hip fracture    Past Medical History:  Past Medical History  Diagnosis Date  . CAD (coronary artery disease)     a. 10/2007 PCI Diag w/ 2.25 x 8mm Taxus Atom DES.;  b. 09/2011 Lexi MV: EF 69%, no ischemia/infarct.  Marland Kitchen PVD (peripheral vascular disease)   . Carotid artery stenosis   . HTN (hypertension)   . Hyperlipidemia   . Hypothyroidism   . CKD (chronic kidney disease), stage III   . Diabetes mellitus 07/16/2011  . Allergic state 07/16/2011  . Insomnia 07/16/2011  . Recurrent UTI 07/16/2011  . Asthma     childhood, recurrent at 19  . Myelodysplastic syndrome 04/02/2011  . Sinusitis 07/18/2011  . Anemia 07/18/2011  . Arthritis 07/18/2011  . Cataract extraction status of right eye 08/18/2011  . Anxiety and depression 08/18/2011  . Bronchitis, acute 12/20/2011  . Headache 03/02/2012  . AV heart block     a. 10/2007 MDT Versa dual chamber PPM.  .  Bladder cancer 12/2002  . Closed intertrochanteric fracture of left hip 05/27/2012   Past Surgical History:  Past Surgical History  Procedure Date  . Cholecystectomy   . Pcm - medtronic   . Abdominal hysterectomy   . Shoulder open rotator cuff repair   . Foot surgery     bunionectomy  . Intramedullary (im) nail intertrochanteric 05/27/2012    Procedure: INTRAMEDULLARY (IM) NAIL INTERTROCHANTRIC;  Surgeon: Eulas Post, MD;  Location: [facility] OR;  Service: Orthopedics;  Laterality: Left;    Assessment & Plan Clinical Impression: Patient is a 79 y.o. year old female with recent admission to the hospital with history of hypertension, chronic kidney insufficiency with baseline creatinine 1.4 and diabetes mellitus with peripheral neuropathy. Patient with history of multiple falls most recently October 2012 resulting in knee fracture and skilled nursing facility admission. Admitted 05/26/2012 after a fall while patient was at the hair salon and she stood up starting to walk when she became dizzy. She denied any loss of consciousness. X-rays and imaging revealed left intertrochanteric hip fracture. Underwent intramedullary nail 05/27/2012 per Dr. Dion Saucier. Advised weightbearing as tolerated. Postoperative pain management.  Patient transferred to CIR on 05/31/2012 .   Patient currently requires min to mod A with mobility secondary to muscle weakness and muscle joint tightness, decreased cardiorespiratoy endurance and decreased standing balance and decreased balance strategies.  Prior to hospitalization, patient was independent  with mobility and  lived with Alone in a House.  Home access is 2 steps from driveway->porch, 1 porch ->sunroom.  Patient will benefit from skilled PT intervention to maximize safe functional mobility, minimize fall risk and decrease caregiver burden for planned discharge home with intermittent assist.  Anticipate patient will benefit from follow up [facility] at discharge.  PT - End of  Session Endurance Deficit: Yes PT Assessment Rehab Potential: Good PT Plan PT Intensity: Minimum of 1-2 x/day ,45 to 90 minutes PT Frequency: 5 out of 7 days PT Duration Estimated Length of Stay: 10-12 days PT Treatment/Interventions: Ambulation/gait training;Balance/vestibular training;Community reintegration;Discharge planning;DME/adaptive equipment instruction;Functional mobility training;Neuromuscular re-education;Pain management;Patient/family education;Psychosocial support;Skin care/wound management;Stair training;Therapeutic Activities;Therapeutic Exercise;Splinting/orthotics;UE/LE Strength taining/ROM;UE/LE Coordination activities;Wheelchair propulsion/positioning PT Recommendation Follow Up Recommendations: Home health PT;Other (comment) (intermittent S due to h/o multiple falls) Patient destination: Home Equipment Recommended: Rolling walker with 5" wheels  PT Evaluation Precautions/Restrictions Precautions Precautions: Fall Precaution Comments: patient with multiple falls in the past  Restrictions Weight Bearing Restrictions: Yes LLE Weight Bearing: Weight bearing as tolerated Pain Pain Assessment Pain Assessment: 0-10 Pain Score:   4 Pain Type: Acute pain;Surgical pain Pain Location: Hip Pain Orientation: Left Pain Descriptors: Sore Pain Intervention(s): RN made aware; Declined pain medication Home Living/Prior Functioning Home Living Lives With: Alone Available Help at Discharge: Family;Available PRN/intermittently Type of Home: House Home Access: Stairs to enter [employer] of Steps: 2 from driveway->porch, 1 porch ->sunroom Entrance Stairs-Rails: Left;Right Home Layout: One level Bathroom Shower/Tub: Forensic scientist: Standard Bathroom Accessibility: Yes How Accessible: Accessible via walker Home Adaptive Equipment: Bedside commode/3-in-1;Tub transfer bench;Walker - rolling;Hand-held shower hose Prior Function Level of  Independence: Independent with basic ADLs;Independent with homemaking with ambulation;Independent with gait;Independent with transfers Able to Take Stairs?: Yes Driving: Yes Vision/Perception  Vision - History Baseline Vision: No visual deficits Patient Visual Report: No change from baseline Vision - Assessment Vision Assessment: Vision not tested Perception Perception: Within Functional Limits Praxis Praxis: Intact  Cognition Overall Cognitive Status: Appears within functional limits for tasks assessed Arousal/Alertness: Awake/alert Orientation Level: Oriented X4 Memory: Appears intact Awareness: Appears intact Problem Solving: Appears intact Safety/Judgment: Appears intact Sensation Sensation Light Touch: Appears Intact Proprioception: Appears Intact Additional Comments: bilateral UEs appear intact Coordination Gross Motor Movements are Fluid and Coordinated: Yes Fine Motor Movements are Fluid and Coordinated: Yes Motor  Motor Motor: Within Functional Limits (generalized weakness LLE > RLE)    Locomotion  Ambulation Ambulation/Gait Assistance: 4: Min assist  Trunk/Postural Assessment  Cervical Assessment Cervical Assessment: Within Functional Limits Thoracic Assessment Thoracic Assessment: Within Functional Limits Lumbar Assessment Lumbar Assessment: Within Functional Limits Postural Control Postural Control: Within Functional Limits  Balance Balance Balance Assessed: Yes Static Sitting Balance Static Sitting - Level of Assistance: 6: Modified independent (Device/Increase time) Dynamic Sitting Balance Dynamic Sitting - Level of Assistance: 5: Stand by assistance Static Standing Balance Static Standing - Level of Assistance: 4: Min assist Dynamic Standing Balance Dynamic Standing - Level of Assistance: 4: Min assist Extremity Assessment  RUE Assessment RUE Assessment: Within Functional Limits (can benefit from strengthening exercises ) LUE Assessment LUE  Assessment: Within Functional Limits (can benefit from strengthening exercises; rotator cuff surg) RLE Assessment RLE Assessment: Within Functional Limits (decreased muscular endurance) LLE Assessment LLE Assessment: Exceptions to [facility] (grossly 3/5 at hip; 3+/5 knee and  4/5 ankle)  FIM:  FIM - Bed/Chair Transfer Bed/Chair Transfer Assistive Devices: Arm rests Bed/Chair Transfer: 3: Supine > Sit: Mod A (lifting assist/Pt. 50-74%/lift 2 legs;4: Sit > Supine: Min A (steadying  pt. > 75%/lift 1 leg);3: Bed > Chair or W/C: Mod A (lift or lower assist);3: Chair or W/C > Bed: Mod A (lift or lower assist) FIM - Locomotion: Wheelchair Locomotion: Wheelchair: 1: Total Assistance/staff pushes wheelchair (Pt<25%) (gait to be primary means) FIM - Locomotion: Ambulation Locomotion: Ambulation Assistive Devices: Designer, industrial/product Ambulation/Gait Assistance: 4: Min assist Locomotion: Ambulation: 1: Travels less than 50 ft with minimal assistance (Pt.>75%) FIM - Locomotion: Stairs Locomotion: Building control surveyor: Hand rail - 2 Locomotion: Stairs: 1: Up and Down < 4 stairs with minimal assistance (Pt.>75%)   Refer to Care Plan for Long Term Goals  Recommendations for other services: None  Discharge Criteria: Patient will be discharged from PT if patient refuses treatment 3 consecutive times without medical reason, if treatment goals not met, if there is a change in medical status, if patient makes no progress towards goals or if patient is discharged from hospital.  The above assessment, treatment plan, treatment alternatives and goals were discussed and mutually agreed upon: by patient  Individual treatment initiated with focus on transfer training, use of RW for gait and transfers, stair training, and discussion of overall goals. Pt with excellent participation and motivated to return home.   [name] [facility] 06/01/2012, 9:43 AM

## 2012-06-02 ENCOUNTER — Inpatient Hospital Stay (HOSPITAL_COMMUNITY): Payer: Medicare Other | Admitting: Occupational Therapy

## 2012-06-02 ENCOUNTER — Inpatient Hospital Stay (HOSPITAL_COMMUNITY): Payer: Medicare Other | Admitting: Physical Therapy

## 2012-06-02 ENCOUNTER — Inpatient Hospital Stay (HOSPITAL_COMMUNITY): Payer: Medicare Other

## 2012-06-02 DIAGNOSIS — N289 Disorder of kidney and ureter, unspecified: Secondary | ICD-10-CM

## 2012-06-02 DIAGNOSIS — F329 Major depressive disorder, single episode, unspecified: Secondary | ICD-10-CM

## 2012-06-02 DIAGNOSIS — S72143A Displaced intertrochanteric fracture of unspecified femur, initial encounter for closed fracture: Secondary | ICD-10-CM

## 2012-06-02 DIAGNOSIS — R296 Repeated falls: Secondary | ICD-10-CM

## 2012-06-02 DIAGNOSIS — D62 Acute posthemorrhagic anemia: Secondary | ICD-10-CM

## 2012-06-02 DIAGNOSIS — J449 Chronic obstructive pulmonary disease, unspecified: Secondary | ICD-10-CM

## 2012-06-02 LAB — URINE CULTURE

## 2012-06-02 LAB — GLUCOSE, CAPILLARY

## 2012-06-02 LAB — PROTIME-INR: Prothrombin Time: 28.1 seconds — ABNORMAL HIGH (ref 11.6–15.2)

## 2012-06-02 MED ORDER — WARFARIN SODIUM 1 MG PO TABS
1.0000 mg | ORAL_TABLET | Freq: Once | ORAL | Status: AC
  Administered 2012-06-02: 1 mg via ORAL
  Filled 2012-06-02: qty 1

## 2012-06-02 NOTE — Progress Notes (Signed)
Social Work  Social Work Assessment and Plan  Patient Details  Name: [name] MRN: [id number] Date of Birth: Sep 07, 1923  Today's Date: 06/02/2012  Problem List:  Patient Active Problem List  Diagnosis  . HYPOTHYROIDISM  . HYPERLIPIDEMIA-MIXED  . HYPERTENSION, UNSPECIFIED  . SECOND DEGREE AV BLOCK, MOBITZ II  . VENTRICULAR TACHYCARDIA  . CAROTID ARTERY STENOSIS, WITHOUT INFARCTION  . PVD  . HYPOTENSION, ORTHOSTATIC  . RENAL FAILURE, CHRONIC  . Myelodysplastic syndrome  . NSVT (nonsustained ventricular tachycardia)  . Dizziness  . CAD  . Diabetes mellitus  . Allergic state  . Insomnia  . Recurrent UTI  . Bladder cancer  . Sinusitis  . Anemia  . Arthritis  . Cataract extraction status of right eye  . Anxiety and depression  . Pacemaker-Medtronic  . Bronchitis, acute  . Headache  . Atypical chest pain  . Closed intertrochanteric fracture of left hip  . Hip fracture   Past Medical History:  Past Medical History  Diagnosis Date  . CAD (coronary artery disease)     a. 10/2007 PCI Diag w/ 2.25 x 8mm Taxus Atom DES.;  b. 09/2011 Lexi MV: EF 69%, no ischemia/infarct.  Marland Kitchen PVD (peripheral vascular disease)   . Carotid artery stenosis   . HTN (hypertension)   . Hyperlipidemia   . Hypothyroidism   . CKD (chronic kidney disease), stage III   . Diabetes mellitus 07/16/2011  . Allergic state 07/16/2011  . Insomnia 07/16/2011  . Recurrent UTI 07/16/2011  . Asthma     childhood, recurrent at 8  . Myelodysplastic syndrome 04/02/2011  . Sinusitis 07/18/2011  . Anemia 07/18/2011  . Arthritis 07/18/2011  . Cataract extraction status of right eye 08/18/2011  . Anxiety and depression 08/18/2011  . Bronchitis, acute 12/20/2011  . Headache 03/02/2012  . AV heart block     a. 10/2007 MDT Versa dual chamber PPM.  . Bladder cancer 12/2002  . Closed intertrochanteric fracture of left hip 05/27/2012   Past Surgical History:  Past Surgical History  Procedure Date  . Cholecystectomy   . Pcm -  medtronic   . Abdominal hysterectomy   . Shoulder open rotator cuff repair   . Foot surgery     bunionectomy  . Intramedullary (im) nail intertrochanteric 05/27/2012    Procedure: INTRAMEDULLARY (IM) NAIL INTERTROCHANTRIC;  Surgeon: Eulas Post, MD;  Location: [facility] OR;  Service: Orthopedics;  Laterality: Left;   Social History:  reports that she has never smoked. She has never used smokeless tobacco. She reports that she does not drink alcohol or use illicit drugs.  Family / Support Systems Marital Status: Widow/Widower How Long?: 19 years Patient Roles: Parent Children: daughter, Orvan July @ (H) [phone] or (C) [phone] and son, Dody Smartt 2 (H) [phone] or (C) [phone]  (pt has one more daughter, however, she has h/o TBI and cannot assist in any way) Anticipated Caregiver: self with intermittent assistance of son and daughter Ability/Limitations of Caregiver: son and daughter both work full-time but with somewhat flexible jobs and can assist Caregiver Availability: Intermittent Family Dynamics: Pt describes children as very supportive  Social History Preferred language: English Religion: Baptist Cultural Background: NA Education: HS Read: Yes Write: Yes Employment Status: Retired Date Retired/Disabled/Unemployed: actually never held a formal job - 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  Denies any emotional distress beyond general frustration with circumstances.  Optimistic about her ability to regain independence and be able to return home alone.   Recent Psychosocial Issues: None Pyschiatric History: None Substance Abuse  History: None  Patient / Family Perceptions, Expectations & Goals Pt/Family understanding of illness & functional limitations: pt and son with good understanding of her hip fx, functional limitations and weight bearing status (WBAT) Premorbid pt/family roles/activities: Pt active and independent.  Notes she does not drive much in the community and tries to "... be as safe as I can at home" given h/o multiple falls Anticipated changes in roles/activities/participation: little change anticipated if she is able to reach mod i goals Pt/family expectations/goals: Mod i goals  [employer]: None Premorbid Home Care/DME Agencies: Other (Comment) [facility] after knee injury 2012) Transportation available at discharge: yes  Discharge Planning Living Arrangements: Alone Support Systems: Children Type of Residence: Private residence Insurance Resources: [employer] Financial Resources: Social Security Financial Screen Referred: No Living Expenses: Own Money Management: Patient Do you have any problems obtaining your medications?: No Home Management: pt Patient/Family Preliminary Plans: Pt plans to return home at a mod i level Social Work Anticipated Follow Up Needs: HH/OP Expected length of stay: 10-14 days  Clinical Impression Very pleasant woman here after a fall at the beauty shop with a hip fx. Feels optimistic that she will reach targeted mod i goals of team.  Good family support.  No concerns at this point.  Eliaz Fout 06/02/2012, 3:53 PM

## 2012-06-02 NOTE — Progress Notes (Signed)
Physical Therapy Session Note  Patient Details  Name: [name] MRN: [id number] Date of Birth: [birth year]  Today's Date: 06/02/2012 Time: 0900-0930 Time Calculation (min): 30 min  Short Term Goals: Week 1:  PT Short Term Goal 1 (Week 1): Pt will demonstrate bed mobiliity with S PT Short Term Goal 2 (Week 1): Pt will perform functional transfers with S PT Short Term Goal 3 (Week 1): Pt will gait x 100' with S PT Short Term Goal 4 (Week 1): Pt will demonstrate dynamic standing balance with S  Skilled Therapeutic Interventions/Progress Updates:  Ambulation with RW on tiled surface 100' with close supervision.  Dynamic standing balance activities with min A while using UE support.  Patient unable to maintain balance when challenged without UE support.  Ambulation with RW in home environment 25' forward, side-stepping to left and right 8' each way.  Patient required VCs for technique.  Patient returned to room via [facility] at end of session.    Therapy Documentation Precautions:  Precautions Precautions: Fall Precaution Comments: patient with multiple falls in the past  Restrictions Weight Bearing Restrictions: Yes LLE Weight Bearing: Weight bearing as tolerated  Pain: Pain Assessment Pain Assessment: 0-10 Pain Score:   5 Pain Type: Surgical pain Pain Location: Hip Pain Orientation: Left Pain Descriptors: Aching Pain Onset: With Activity Pain Intervention(s): Ambulation/increased activity;Distraction  See FIM for current functional status  Therapy/Group: Individual Therapy  Rexene Agent 06/02/2012, 11:59 AM

## 2012-06-02 NOTE — Progress Notes (Signed)
Physical Therapy Session Note  Patient Details  Name: [name] MRN: [id number] Date of Birth: [birth year]  Today's Date: 06/02/2012 Time: 1015-1100 Time Calculation (min): 45 min  Short Term Goals: Week 1:  PT Short Term Goal 1 (Week 1): Pt will demonstrate bed mobiliity with S PT Short Term Goal 2 (Week 1): Pt will perform functional transfers with S PT Short Term Goal 3 (Week 1): Pt will gait x 100' with S PT Short Term Goal 4 (Week 1): Pt will demonstrate dynamic standing balance with S  Skilled Therapeutic Interventions/Progress Updates:    Close S for gait to bathroom and completing toileting needs; pt with urgency and started without her walker and therapist needed to cue her to use her RW to walk into the bathroom vs hold onto the wall/sink, etc. Verbalized understanding. Functional task to put on socks and shoes (required A with L sock and shoe). Stair training for home entry with rails with steady A and then practiced curb step with RW with steady A and progressed to close S. Simulated car transfer x 2 reps with S; increased time to bring LLE into/out of car but able to do independently.  Therapy Documentation Precautions:  Precautions Precautions: Fall Precaution Comments: patient with multiple falls in the past  Restrictions Weight Bearing Restrictions: Yes LLE Weight Bearing: Weight bearing as tolerated Pain:  See FIM for current functional status  Therapy/Group: Individual Therapy  [name] [facility] 06/02/2012, 12:05 PM

## 2012-06-02 NOTE — Progress Notes (Signed)
Inpatient Rehabilitation Center Individual Statement of Services  Patient Name:  [name]  Date:  06/02/2012  Welcome to the Inpatient Rehabilitation Center.  Our goal is to provide you with an individualized program based on your diagnosis and situation, designed to meet your specific needs.  With this comprehensive rehabilitation program, you will be expected to participate in at least 3 hours of rehabilitation therapies Monday-Friday, with modified therapy programming on the weekends.  Your rehabilitation program will include the following services:  Physical Therapy (PT), Occupational Therapy (OT), 24 hour per day rehabilitation nursing, Case Management (RN and Social Worker), Nutrition Services and Pharmacy Services  Weekly team conferences will be held on Tuesdays to discuss your progress.  Your  Social Worker will talk with you frequently to get your input and to update you on team discussions.  Team conferences with you and your family in attendance may also be held.  Expected length of stay: 10-14 days  Overall anticipated outcome: modified independent  Depending on your progress and recovery, your program may change.  Your  Social Worker will coordinate services and will keep you informed of any changes.  Your Social Worker's name and contact numbers are listed  below.  The following services may also be recommended but are not provided by the Inpatient Rehabilitation Center:   Driving Evaluations  Home Health Rehabiltiation Services  Outpatient Rehabilitatation [facility]  Vocational Rehabilitation   Arrangements will be made to provide these services after discharge if needed.  Arrangements include referral to agencies that provide these services.  Your insurance has been verified to be:  [employer] and Medicaid Your primary doctor is:  Dr. Rogelia Rohrer  Pertinent information will be shared with your doctor and your insurance company.   Social Worker:  [place], [place]  [phone] or [phone]  Information discussed with and copy given to patient by: Amada Jupiter, 06/02/2012, 3:54 PM

## 2012-06-02 NOTE — Progress Notes (Signed)
Subjective/Complaints: No complaints. Slept rather well. Had a good day with therapy. Moved bowels.  Objective: Vital Signs: Blood pressure 127/53, pulse 63, temperature 97.7 F (36.5 C), temperature source Oral, resp. rate 17, weight 48 kg (105 lb 13.1 oz), SpO2 96.00%. No results found.  Basename 06/01/12 0710  WBC 10.4  HGB 10.4*  HCT 31.8*  PLT 117*    Basename 06/01/12 0710  NA 142  K 4.9  CL 102  GLUCOSE 134*  BUN 24*  CREATININE 1.32*  CALCIUM 9.2   CBG (last 3)   Basename 06/02/12 1129 06/02/12 0730 06/01/12 2117  GLUCAP 105* 112* 192*    Wt Readings from Last 3 Encounters:  05/31/12 48 kg (105 lb 13.1 oz)  05/29/12 47.5 kg (104 lb 11.5 oz)  05/29/12 47.5 kg (104 lb 11.5 oz)    Physical Exam:  Constitutional: She is oriented to person, place, and time. NAD  HENT: oral mucosa pink and moist  Head: Normocephalic.  Eyes:  Pupils round and reactive to light  Neck: Normal range of motion. Neck supple. No thyromegaly present.  Cardiovascular: Normal rate and regular rhythm. No M,R,G  Pulmonary/Chest: Effort normal and breath sounds normal. No respiratory distress. No wheezes, rales or rhonchi  Abdominal: Soft. Bowel sounds are normal. She exhibits no distension.  Musculoskeletal: She exhibits no edema.  Left hip appropriately tender. Dressed with minimal serosanginous drainage from wounds Neurological: She is alert and oriented to person, place, and time.  Follows full commands. fxnl UE strength. No sensory deficits except for mild LT and PP loss in the feet. Right LE 3/5 prox to 4+/5 distally/ LLE is 1-2 proximally but 4/5 distally at ankle.  Skin:     Psychiatric: She has a normal mood and affect. Her behavior is normal. Judgment and thought content normal    Assessment/Plan: 1. Functional deficits secondary to left IT hip fx s/p IMN which require 3+ hours per day of interdisciplinary therapy in a comprehensive inpatient rehab setting. Physiatrist is  providing close team supervision and 24 hour management of active medical problems listed below. Physiatrist and rehab team continue to assess barriers to discharge/monitor patient progress toward functional and medical goals. FIM: FIM - Bathing Bathing Steps Patient Completed: Chest;Right Arm;Left Arm;Abdomen;Front perineal area;Buttocks;Right upper leg;Left upper leg;Right lower leg (including foot);Left lower leg (including foot) Bathing: 4: Steadying assist (sit-> stand and using long handled sponge)  FIM - Upper Body Dressing/Undressing Upper body dressing/undressing steps patient completed: Thread/unthread right sleeve of pullover shirt/dresss;Thread/unthread left sleeve of pullover shirt/dress;Put head through opening of pull over shirt/dress;Pull shirt over trunk Upper body dressing/undressing: 4: Steadying assist (in standing position) FIM - Lower Body Dressing/Undressing Lower body dressing/undressing steps patient completed: Thread/unthread right underwear leg;Pull underwear up/down;Thread/unthread right pants leg;Pull pants up/down;Don/Doff right sock;Don/Doff right shoe;Fasten/unfasten right shoe Lower body dressing/undressing: 1: Total-Patient completed less than 25% of tasks  FIM - Toileting Toileting steps completed by patient: Adjust clothing prior to toileting;Performs perineal hygiene;Adjust clothing after toileting Toileting: 4: Steadying assist  FIM - Archivist Transfers Assistive Devices: Elevated toilet seat;Grab bars;Walker Toilet Transfers: 4-To toilet/BSC: Min A (steadying Pt. > 75%);4-From toilet/BSC: Min A (steadying Pt. > 75%)  FIM - Banker Devices: Walker;Arm rests Bed/Chair Transfer: 5: Supine > Sit: Supervision (verbal cues/safety issues);4: Sit > Supine: Min A (steadying pt. > 75%/lift 1 leg);4: Bed > Chair or W/C: Min A (steadying Pt. > 75%);4: Chair or W/C > Bed: Min A (steadying Pt. > 75%)  FIM -  Locomotion: Wheelchair Locomotion: Wheelchair: 2: Travels 50 - 149 ft with minimal assistance (Pt.>75%) FIM - Locomotion: Ambulation Locomotion: Ambulation Assistive Devices: Designer, industrial/product Ambulation/Gait Assistance: 5: Supervision Locomotion: Ambulation: 2: Travels 50 - 149 ft with supervision/safety issues  Comprehension Comprehension Mode: Auditory Comprehension: 6-Follows complex conversation/direction: With extra time/assistive device  Expression Expression Mode: Verbal Expression: 6-Expresses complex ideas: With extra time/assistive device  Social Interaction Social Interaction: 6-Interacts appropriately with others with medication or extra time (anti-anxiety, antidepressant).  Problem Solving Problem Solving: 6-Solves complex problems: With extra time  Memory Memory: 6-More than reasonable amt of time  Medical Problem List and Plan:  1. Left intertrochanteric hip fracture status post intramedullary nail 05/27/2012  2. DVT Prophylaxis/Anticoagulation: Coumadin for DVT prophylaxis. Latest INR 3.03 --adjustment per pharmacy 3. Pain Management: MSIR 7.5 mg every 6 hours as needed pain and Robaxin as needed. Monitor with increased mobility  4. Mood: Paxil 10 mg daily.well compensated at present. 5. Neuropsych: This patient is capable of making decisions on his/her own behalf.  6. Postoperative anemia. Transfused 2 units of packed red blood cells 05/29/2012. Recheck next week 7. hypertension. Norvasc 5 mg daily. Monitor with increased mobility  8. History of asthma.Dulera 2 puffs twice a day, Singulair 10 mg daily. Patient with no complaints of shortness of breath at present 9. Hypothyroidism. Synthroid  10. Diabetes mellitus. Latest hemoglobin A1c of 6.5. Continue sliding scale insulin. Check blood sugars a.c. and at bedtime. Patient on Glucophage 1000 mg twice a day prior to admission. Sugars under control thus far 11. Chronic renal insufficiency. Cr within normal limits     12. CAD. Continue aspirin therapy. Patient no complaints of chest pain  13. Hyperlipidemia. Lipitor  14. Recurrent UTI. Placed empirically on Cipro 05/30/2012 with urinalysis study negative nitrite and 7-10 WBC. Follow up urine culture still pending.  15. Constipation: positive results with adjusted regimen  LOS (Days) 2 A FACE TO FACE EVALUATION WAS PERFORMED  Raeqwon Lux T 06/02/2012 2:15 PM

## 2012-06-02 NOTE — Progress Notes (Signed)
Occupational Therapy Session Notes  Patient Details  Name: [name] MRN: [id number] Date of Birth: 1923/09/27  Today's Date: 06/02/2012  Short Term Goals: Week 1:  OT Short Term Goal 1 (Week 1): Patient will perform UB/LB bathing at supervision level OT Short Term Goal 2 (Week 1): Patient will perform LB dressing at supervision level OT Short Term Goal 3 (Week 1): Patient will perform toileting and toilet transfer at supervision level OT Short Term Goal 4 (Week 1): Patient will perform grooming tasks standing at sink with supervision  Skilled Therapeutic Interventions/Progress Updates:   Session #1 [id number] - 60 Minutes Individual Therapy Patient with 4/10 pain, patient refused pain medication at this time Patient found seated in w/c. Patient engaged in functional ambulation using rolling walker from w/c -> shower stall for transfer. Patient then engaged in ADL retraining at shower level in sit-> stand position using grab bars prn and long handled sponge prn. Patient ambulated back to w/c after shower for UB/LB dressing. Focused skilled intervention on functional mobility/ambulation, UB/LB bathing & dressing, dynamic standing balance/tolerance/endurance, toilet transfer onto elevated toilet seat using grab bars, toileting, and overall activity tolerance/endurance. Patient with LOB multiple times during dynamic standing tasks, therapist assisted with re-gaining balance (steady assist). At end of session left patient seated in w/c with bilateral leg rests donned and call bell & phone within reach.    Session #2 1400-1500 - 60 Minutes Individual Therapy Patient with complaints of pain during activities, patient refused any medication at this time Patient found ambulating from bathroom with RN present in room. Patient stood at sink with therapist to wash hands then ambulated from room -> nurses station with close supervision using rolling walker. Patient then sat in w/c and propelled self  -> ADL apartment. Ambulated throughout kitchen and focused on sit/stands from various surfaces, functional mobility using rolling walker, and safety with rolling walker during kitchen tasks. Patient then ambulated -> bathroom for simulated tub/shower transfer on/off tub transfer bench with min assist (for therapist to assist with LLE). Patient then ambulated -> bed for focus on bed mobility. Patient will need more education and practice with these tasks prior to d/c -> home at mod I level. At end of session therapist propelled patient back to room and left seated in w/c beside bed with call bell & phone within reach.   Precautions:  Precautions Precautions: Fall Precaution Comments: patient with multiple falls in the past  Restrictions Weight Bearing Restrictions: Yes LLE Weight Bearing: Weight bearing as tolerated  See FIM for current functional status  Kierah Goatley 06/02/2012, 7:42 AM

## 2012-06-02 NOTE — Progress Notes (Signed)
ANTICOAGULATION CONSULT NOTE - Follow Up Consult  Pharmacy Consult for coumadin Indication: VTE prophylaxis  Allergies  Allergen Reactions  . Codeine Anaphylaxis and Swelling  . Penicillins Anaphylaxis and Swelling  . Sulfonamide Derivatives Anaphylaxis and Swelling    Patient Measurements: Weight: 105 lb 13.1 oz (48 kg) Heparin Dosing Weight:   Vital Signs: Temp: 97.7 F (36.5 C) (01/17 0449) Temp src: Oral (01/17 0449) BP: 127/53 mmHg (01/17 0449) Pulse Rate: 63  (01/17 0449)  Labs:  Basename 06/02/12 0600 06/01/12 0710 05/31/12 0805  HGB -- 10.4* --  HCT -- 31.8* --  PLT -- 117* --  APTT -- -- --  LABPROT 28.1* 29.8* 38.7*  INR 2.80* 3.03* 4.32*  HEPARINUNFRC -- -- --  CREATININE -- 1.32* --  CKTOTAL -- -- --  CKMB -- -- --  TROPONINI -- -- --    The CrCl is unknown because both a height and weight (above a minimum accepted value) are required for this calculation.   Medications:  Scheduled:    . amLODipine  5 mg Oral Daily  . aspirin  81 mg Oral Daily  . atorvastatin  40 mg Oral q1800  . ciprofloxacin  250 mg Oral BID  . insulin aspart  0-9 Units Subcutaneous TID WC  . levothyroxine  112 mcg Oral QAC breakfast  . mometasone-formoterol  2 puff Inhalation BID  . montelukast  10 mg Oral Daily  . PARoxetine  10 mg Oral Daily  . senna-docusate  2 tablet Oral QHS  . [COMPLETED] warfarin  1 mg Oral ONCE-1800  . Warfarin - Pharmacist Dosing Inpatient   Does not apply q1800   Infusions:    Assessment: 79 yo female s/p ortho surgery is currently on therapeutic coumadin after being supratherapeutic for few days. INR is 2.8.  On cipro. Goal of Therapy:  INR 2-3    Plan:  1) Repeat coumadin 1mg  po x1 today 2) INR in am  [name], [name] 06/02/2012,11:19 AM

## 2012-06-03 ENCOUNTER — Inpatient Hospital Stay (HOSPITAL_COMMUNITY): Payer: Medicare Other | Admitting: Physical Therapy

## 2012-06-03 ENCOUNTER — Inpatient Hospital Stay (HOSPITAL_COMMUNITY): Payer: Medicare Other | Admitting: Occupational Therapy

## 2012-06-03 DIAGNOSIS — R296 Repeated falls: Secondary | ICD-10-CM

## 2012-06-03 DIAGNOSIS — S72143A Displaced intertrochanteric fracture of unspecified femur, initial encounter for closed fracture: Secondary | ICD-10-CM

## 2012-06-03 LAB — PROTIME-INR
INR: 2.71 — ABNORMAL HIGH (ref 0.00–1.49)
Prothrombin Time: 27.4 seconds — ABNORMAL HIGH (ref 11.6–15.2)

## 2012-06-03 LAB — GLUCOSE, CAPILLARY: Glucose-Capillary: 135 mg/dL — ABNORMAL HIGH (ref 70–99)

## 2012-06-03 MED ORDER — TRAZODONE HCL 50 MG PO TABS
50.0000 mg | ORAL_TABLET | Freq: Every evening | ORAL | Status: DC | PRN
Start: 2012-06-03 — End: 2012-06-10
  Administered 2012-06-03 – 2012-06-04 (×2): 50 mg via ORAL
  Filled 2012-06-03 (×2): qty 1

## 2012-06-03 MED ORDER — WARFARIN SODIUM 1 MG PO TABS
1.0000 mg | ORAL_TABLET | Freq: Once | ORAL | Status: AC
  Administered 2012-06-03: 1 mg via ORAL
  Filled 2012-06-03: qty 1

## 2012-06-03 NOTE — Progress Notes (Signed)
Physical Therapy Session Note  Patient Details  Name: [name] MRN: [id number] Date of Birth: 04/29/1924  Today's Date: 06/03/2012 Time: 1478-2956 Time Calculation (min): 50 min  Short Term Goals: Week 1:  PT Short Term Goal 1 (Week 1): Pt will demonstrate bed mobiliity with S PT Short Term Goal 2 (Week 1): Pt will perform functional transfers with S PT Short Term Goal 3 (Week 1): Pt will gait x 100' with S PT Short Term Goal 4 (Week 1): Pt will demonstrate dynamic standing balance with S  Skilled Therapeutic Interventions/Progress Updates:  Supine/sit transfer with elevated HOB using HR, with supervision.  Sit/stand transfer to RW with close supervision.  Toilet transfers x2 using RW and grab bar with supervision for standing to don/doff pajamas.  WC propulsion 50' with min A for UE strengthening and increased endurance.  Ambulation in hallway with RW and close supervision 13' without LOB.    Patient performed dynamic balance activities at activity table on compliant surface for 3 bouts of 5 minutes with seated rest break between bouts.  Returned to room in [facility] by staff.    Therapy Documentation Precautions:  Precautions Precautions: Fall Precaution Comments: patient with multiple falls in the past  Restrictions Weight Bearing Restrictions: Yes LLE Weight Bearing: Weight bearing as tolerated  Pain: Pain Assessment Pain Assessment: 0-10 Pain Score:   5 Pain Type: Surgical pain Pain Location: Hip Pain Orientation: Left Pain Descriptors: Aching Pain Onset: With Activity Pain Intervention(s): Repositioned;Ambulation/increased activity Multiple Pain Sites: No  See FIM for current functional status  Therapy/Group: Individual Therapy  Rexene Agent 06/03/2012, 9:35 AM

## 2012-06-03 NOTE — Progress Notes (Signed)
Physical Therapy Session Note  Patient Details  Name: [name] MRN: [id number] Date of Birth: 1923-09-07  Today's Date: 06/03/2012 Time: 1300-1400 Time Calculation (min): 60 min  Short Term Goals: Week 1:  PT Short Term Goal 1 (Week 1): Pt will demonstrate bed mobiliity with S PT Short Term Goal 2 (Week 1): Pt will perform functional transfers with S PT Short Term Goal 3 (Week 1): Pt will gait x 100' with S PT Short Term Goal 4 (Week 1): Pt will demonstrate dynamic standing balance with S  Therapy Documentation Precautions:  Precautions Precautions: Fall Precaution Comments: patient with multiple falls in the past  Restrictions Weight Bearing Restrictions: Yes LLE Weight Bearing: Weight bearing as tolerated Pain: 4/10 Left hip  Patient was 1 of 4 participants in walking group. Ambulated using standard walker 6  x 150' with S/Mod-I with 5 minutes seated rest between walks. No LOB noted.  3# ankle weights for B LE strengthening while in sitting.    See FIM for current functional status  Therapy/Group: Group Therapy  James Senn J 06/03/2012, 1:06 PM

## 2012-06-03 NOTE — Progress Notes (Signed)
Occupational Therapy Session Note  Patient Details  Name: [name] MRN: [id number] Date of Birth: [birth year]  Today's Date: 06/03/2012 Time: 1055-1140 Time Calculation (min): 45 min  Skilled Therapeutic Interventions/Progress Updates: Shower in room bathroom with 1 Loss of Balance to patient left while standing to wash periarea and patient stated, "I can just be standing still and that happens.   The doctor said it might be due to my blood pressure medicine not being the right amount."   Otherwise, focus on endurance and standing tolerance and balance     Therapy Documentation Precautions:  Precautions Precautions: Fall Precaution Comments: patient with multiple falls in the past  Restrictions Weight Bearing Restrictions: Yes LLE Weight Bearing: Weight bearing as tolerated   Pain:0   See FIM for current functional status  Therapy/Group: Individual Therapy  Bud Face [facility] 06/03/2012, 8:17 PM

## 2012-06-03 NOTE — Progress Notes (Signed)
Subjective/Complaints: Patient denies complaints. She feels well. No significant pain. She slept well and her appetite is good. Objective: Vital Signs: Blood pressure 145/58, pulse 62, temperature 97.8 F (36.6 C), temperature source Oral, resp. rate 17, weight 105 lb 13.1 oz (48 kg), SpO2 100.00%. CBG (last 3)   Basename 06/03/12 0721 06/02/12 2106 06/02/12 1631  GLUCAP 135* 132* 171*     Physical Exam:  Elderly female in no acute distress. She is alert and oriented and carries on normal conversation. HEENT exam: Atraumatic, normocephalic, neck supple. Chest clear to auscultation without increased work of breathing. Cardiac exam S1 and S2 are regular. Abdominal exam active bowel sounds, soft. Extremities trace edema at the ankles.    Assessment/Plan: 1. Functional deficits secondary to left IT hip fx s/p IMN  Medical Problem List and Plan:  1. Left intertrochanteric hip fracture status post intramedullary nail 05/27/2012  2. DVT Prophylaxis/Anticoagulation: Coumadin for DVT prophylaxis.  Lab Results  Component Value Date   INR 2.71* 06/03/2012   INR 2.80* 06/02/2012   INR 3.03* 06/01/2012    3. Pain Management: Current regimen seems adequate. 4. Mood: Paxil 10 mg daily.well compensated at present. 5. Neuropsych: This patient is capable of making decisions on his/her own behalf.  6. Postoperative anemia. Transfused 2 units of packed red blood cells 05/29/2012. Recheck next week 7. hypertension. Norvasc 5 mg daily. Monitor with increased mobility  BP Readings from Last 3 Encounters:  06/03/12 145/58  05/31/12 135/62  05/31/12 135/62    8. History of asthma.Dulera 2 puffs twice a day, Singulair 10 mg daily. Patient denies shortness of breath or dyspnea. 9. Hypothyroidism. Synthroid  10. Diabetes mellitus. Latest hemoglobin A1c of 6.5. Continue sliding scale insulin. Check blood sugars a.c. and at bedtime. Patient on Glucophage 1000 mg twice a day prior to admission. Sugars under  control thus far CBG (last 3)   Basename 06/03/12 0721 06/02/12 2106 06/02/12 1631  GLUCAP 135* 132* 171*   We'll continue to follow. She is on sliding scale insulin.  11. Chronic renal insufficiency. Cr within normal limits   12. CAD. Continue aspirin therapy. Patient no complaints of chest pain  13. Hyperlipidemia. Lipitor  14. Recurrent UTI. Placed empirically on Cipro 05/30/2012 with urinalysis study negative nitrite and 7-10 WBC. Follow up urine culture still pending. Urine cultures negative. I'll discontinue Cipro. 15. Constipation: Resolved LOS (Days) 3 A FACE TO FACE EVALUATION WAS PERFORMED  SWORDS,BRUCE HENRY 06/03/2012 10:02 AM

## 2012-06-03 NOTE — Progress Notes (Signed)
Occupational Therapy Session Note  Patient Details  Name: [name] MRN: [id number] Date of Birth: Dec 25, 1923  Today's Date: 06/03/2012 Time: 1410-1440 Time Calculation (min): 30 min  Short Term Goals: Week 1:  OT Short Term Goal 1 (Week 1): Patient will perform UB/LB bathing at supervision level OT Short Term Goal 2 (Week 1): Patient will perform LB dressing at supervision level OT Short Term Goal 3 (Week 1): Patient will perform toileting and toilet transfer at supervision level OT Short Term Goal 4 (Week 1): Patient will perform grooming tasks standing at sink with supervision  Skilled Therapeutic Interventions/Progress Updates:    Patient seen this pm for individual OT.  Patient assisted to sit up from supine and don slippers.  Patient ambulated to bathroom and around room safely.  Patient limiting weight on left lower extremity especially with first few steps.  Patient demonstrated excellent safety awareness this session.  Therapy Documentation Precautions:  Precautions Precautions: Fall Precaution Comments: patient with multiple falls in the past  Restrictions Weight Bearing Restrictions: Yes LLE Weight Bearing: Weight bearing as tolerated  Pain:  Patient did not report pain  See FIM for current functional status  Therapy/Group: Individual Therapy  Collier Salina 06/03/2012, 3:51 PM

## 2012-06-03 NOTE — Progress Notes (Signed)
ANTICOAGULATION CONSULT NOTE - Follow Up Consult  Pharmacy Consult for coumadin Indication: VTE prophylaxis  Allergies  Allergen Reactions  . Codeine Anaphylaxis and Swelling  . Penicillins Anaphylaxis and Swelling  . Sulfonamide Derivatives Anaphylaxis and Swelling    Patient Measurements: Weight: 105 lb 13.1 oz (48 kg) Heparin Dosing Weight:   Vital Signs: Temp: 97.8 F (36.6 C) (01/18 0555) Temp src: Oral (01/18 0555) BP: 145/58 mmHg (01/18 0555) Pulse Rate: 62  (01/18 0555)  Labs:  Basename 06/03/12 0655 06/02/12 0600 06/01/12 0710  HGB -- -- 10.4*  HCT -- -- 31.8*  PLT -- -- 117*  APTT -- -- --  LABPROT 27.4* 28.1* 29.8*  INR 2.71* 2.80* 3.03*  HEPARINUNFRC -- -- --  CREATININE -- -- 1.32*  CKTOTAL -- -- --  CKMB -- -- --  TROPONINI -- -- --    The CrCl is unknown because both a height and weight (above a minimum accepted value) are required for this calculation.   Medications:  Scheduled:     . amLODipine  5 mg Oral Daily  . aspirin  81 mg Oral Daily  . atorvastatin  40 mg Oral q1800  . insulin aspart  0-9 Units Subcutaneous TID WC  . levothyroxine  112 mcg Oral QAC breakfast  . mometasone-formoterol  2 puff Inhalation BID  . montelukast  10 mg Oral Daily  . PARoxetine  10 mg Oral Daily  . senna-docusate  2 tablet Oral QHS  . [COMPLETED] warfarin  1 mg Oral ONCE-1800  . Warfarin - Pharmacist Dosing Inpatient   Does not apply q1800  . [DISCONTINUED] ciprofloxacin  250 mg Oral BID   Infusions:    Assessment: 79 yo female s/p ortho surgery is currently on therapeutic coumadin after being supratherapeutic for few days. INR is 2.71.  Cipro d/c today.  Goal of Therapy:  INR 2-3  Plan:  1) Repeat coumadin 1mg  po x1 today 2) INR in am  Christoper Fabian, PharmD, BCPS Clinical pharmacist, pager [phone] 06/03/2012,11:49 AM

## 2012-06-04 ENCOUNTER — Inpatient Hospital Stay (HOSPITAL_COMMUNITY): Payer: Medicare Other | Admitting: *Deleted

## 2012-06-04 LAB — GLUCOSE, CAPILLARY: Glucose-Capillary: 176 mg/dL — ABNORMAL HIGH (ref 70–99)

## 2012-06-04 NOTE — Progress Notes (Signed)
ANTICOAGULATION CONSULT NOTE - Follow Up Consult  Pharmacy Consult for coumadin Indication: VTE prophylaxis  Allergies  Allergen Reactions  . Codeine Anaphylaxis and Swelling  . Penicillins Anaphylaxis and Swelling  . Sulfonamide Derivatives Anaphylaxis and Swelling    Patient Measurements: Height: 5' (152.4 cm) Weight: 105 lb 13.1 oz (48 kg) IBW/kg (Calculated) : 45.5   Vital Signs:    Labs:  Basename 06/04/12 0526 06/03/12 0655 06/02/12 0600  HGB -- -- --  HCT -- -- --  PLT -- -- --  APTT -- -- --  LABPROT 29.0* 27.4* 28.1*  INR 2.92* 2.71* 2.80*  HEPARINUNFRC -- -- --  CREATININE -- -- --  CKTOTAL -- -- --  CKMB -- -- --  TROPONINI -- -- --    Estimated Creatinine Clearance: 22 ml/min (by C-G formula based on Cr of 1.32).  Assessment: 79 yo female s/p ortho surgery is currently on therapeutic coumadin for VTE prophylaxis. INR still trending up to 2.92.  Goal of Therapy:  INR 2-3  Plan:  1) No coumadin today 2) INR in am  Christoper Fabian, PharmD, BCPS Clinical pharmacist, pager [phone] 06/04/2012,8:34 AM

## 2012-06-04 NOTE — Progress Notes (Signed)
Subjective/Complaints: Sleepy this morning but otherwise no complaints. She only has minimal hip pain at rest. If pain becomes more significant with activity. Objective: Vital Signs: Blood pressure 133/68, pulse 68, temperature 98.4 F (36.9 C), temperature source Oral, resp. rate 18, weight 105 lb 13.1 oz (48 kg), SpO2 99.00%. CBG (last 3)   Basename 06/04/12 0717 06/03/12 1203 06/03/12 0721  GLUCAP 129* 107* 135*     Physical Exam:  Elderly female in no acute distress. HEENT exam: Atraumatic, normocephalic. Chest clear to auscultation without increased work of breathing. Cardiac exam S1-S2 are regular. Abdominal exam active bowel sounds, soft. Extremities without edema.   Assessment/Plan: 1. Functional deficits secondary to left IT hip fx s/p IMN  Medical Problem List and Plan:  1. Left intertrochanteric hip fracture status post intramedullary nail 05/27/2012  2. DVT Prophylaxis/Anticoagulation: Coumadin for DVT prophylaxis.  Lab Results  Component Value Date   INR 2.92* 06/04/2012   INR 2.71* 06/03/2012   INR 2.80* 06/02/2012    3. Pain Management: Currently only has pain with activity.. 4. Mood: Paxil 10 mg daily.well compensated at present. 5. Neuropsych: This patient is capable of making decisions on his/her own behalf.  6. Postoperative anemia. Transfused 2 units of packed red blood cells 05/29/2012. Recheck next week 7. hypertension. Norvasc 5 mg daily. Reasonably well controlled.  BP Readings from Last 3 Encounters:  06/03/12 133/68  05/31/12 135/62  05/31/12 135/62    8. History of asthma.Dulera 2 puffs twice a day, Singulair 10 mg daily.  9. Hypothyroidism. Levothyroxine  10. Diabetes mellitus. Latest hemoglobin A1c of 6.5. Continue sliding scale insulin. Check blood sugars a.c. and at bedtime. Patient on Glucophage 1000 mg twice a day prior to admission. Sugars under control thus far CBG (last 3)   Basename 06/04/12 0717 06/03/12 1203 06/03/12 0721  GLUCAP 129*  107* 135*   We'll continue to follow. She is on sliding scale insulin.  11. Chronic renal insufficiency. Cr within normal limits   Lab Results  Component Value Date   CREATININE 1.32* 06/01/2012   12. CAD. Continue aspirin therapy. Patient no complaints of chest pain  13. Hyperlipidemia. Lipitor  14. Recurrent UTI. Placed empirically on Cipro 05/30/2012 with urinalysis study negative nitrite and 7-10 WBC. Follow up urine culture still pending. Urine cultures negative. I'll discontinue Cipro. 15. Constipation: Resolved LOS (Days) 4 A FACE TO FACE EVALUATION WAS PERFORMED  SWORDS,BRUCE HENRY 06/04/2012 8:10 AM

## 2012-06-04 NOTE — Progress Notes (Signed)
Physical Therapy Session Note  Patient Details  Name: KIRSTI MCALPINE MRN: [id number] Date of Birth: 21-Oct-1923  Today's Date: 06/04/2012 Time: 0100-0200 Time Calculation (min): 60 min   Therapy Documentation Precautions:  Precautions Precautions: Fall Precaution Comments: patient with multiple falls in the past  Restrictions Weight Bearing Restrictions: Yes LLE Weight Bearing: Weight bearing as tolerated Vital Signs: Therapy Vitals Temp: 98.2 F (36.8 C) Temp src: Oral Pulse Rate: 86  Resp: 18  BP: 116/70 mmHg Patient Position, if appropriate: Sitting Oxygen Therapy SpO2: 99 % Pain: Pain Assessment Pain Assessment: 0-10 Pain Score:   5 Faces Pain Scale: No hurt Pain Type: Surgical pain Pain Location: Hip Pain Orientation: Left Pain Descriptors: Aching Locomotion : Ambulation Ambulation/Gait Assistance: 5: Supervision   Group therapy session including gait with RW, maintaining WB precautions, 3x 200 feet with supervision. Active exercises to B LE.w/c push-ups.  See FIM for current functional status  Therapy/Group: Group Therapy  Dorna Mai 06/04/2012, 3:41 PM

## 2012-06-05 ENCOUNTER — Inpatient Hospital Stay (HOSPITAL_COMMUNITY): Payer: Medicare Other | Admitting: Occupational Therapy

## 2012-06-05 ENCOUNTER — Inpatient Hospital Stay (HOSPITAL_COMMUNITY): Payer: Medicare Other

## 2012-06-05 LAB — PROTIME-INR: Prothrombin Time: 27.6 seconds — ABNORMAL HIGH (ref 11.6–15.2)

## 2012-06-05 LAB — GLUCOSE, CAPILLARY
Glucose-Capillary: 131 mg/dL — ABNORMAL HIGH (ref 70–99)
Glucose-Capillary: 98 mg/dL (ref 70–99)
Glucose-Capillary: 158 mg/dL — ABNORMAL HIGH (ref 70–99)

## 2012-06-05 MED ORDER — WARFARIN SODIUM 1 MG PO TABS
1.0000 mg | ORAL_TABLET | Freq: Once | ORAL | Status: AC
  Administered 2012-06-05: 1 mg via ORAL
  Filled 2012-06-05: qty 1

## 2012-06-05 NOTE — Progress Notes (Signed)
Occupational Therapy Session Note  Patient Details  Name: [name] MRN: [id number] Date of Birth: May 14, 1924  Today's Date: 06/05/2012 Time: 0830-0930 Time Calculation (min): 60 min  Short Term Goals: Week 1:  OT Short Term Goal 1 (Week 1): Patient will perform UB/LB bathing at supervision level OT Short Term Goal 2 (Week 1): Patient will perform LB dressing at supervision level OT Short Term Goal 3 (Week 1): Patient will perform toileting and toilet transfer at supervision level OT Short Term Goal 4 (Week 1): Patient will perform grooming tasks standing at sink with supervision  Skilled Therapeutic Interventions/Progress Updates:  Patient found seated in w/c beside bed. Patient ambulated from w/c -> elevated toilet seat in bathroom for toileting then ambulated from toilet seat -> tub transfer bench in walk-in shower. Focused skilled intervention on functional mobility/ambulation throughout room using rolling walker, toilet transfer on/off elevated toilet seat, shower stall transfer on/off tub transfer bench, sit/stands, dynamic standing balance/tolerance/endurance, grooming tasks standing at sink, and overall activity tolerance/endurance. At end of session left patient seated in w/c with call bell & phone within reach.   Precautions:  Precautions Precautions: Fall Precaution Comments: patient with multiple falls in the past  Restrictions Weight Bearing Restrictions: Yes LLE Weight Bearing: Weight bearing as tolerated  See FIM for current functional status  Therapy/Group: Individual Therapy  Dorean Daniello 06/05/2012, 9:32 AM

## 2012-06-05 NOTE — Progress Notes (Signed)
ANTICOAGULATION CONSULT NOTE - Follow Up Consult  Pharmacy Consult for coumadin Indication: VTE prophylaxis  Allergies  Allergen Reactions  . Codeine Anaphylaxis and Swelling  . Penicillins Anaphylaxis and Swelling  . Sulfonamide Derivatives Anaphylaxis and Swelling    Patient Measurements: Height: 5' (152.4 cm) Weight: 105 lb 13.1 oz (48 kg) IBW/kg (Calculated) : 45.5  Heparin Dosing Weight:   Vital Signs: Temp: 98 F (36.7 C) (01/20 0519) Temp src: Oral (01/20 0519) BP: 146/72 mmHg (01/20 0519) Pulse Rate: 62  (01/20 0519)  Labs:  Alvira Philips 06/05/12 0625 06/04/12 0526 06/03/12 0655  HGB -- -- --  HCT -- -- --  PLT -- -- --  APTT -- -- --  LABPROT 27.6* 29.0* 27.4*  INR 2.73* 2.92* 2.71*  HEPARINUNFRC -- -- --  CREATININE -- -- --  CKTOTAL -- -- --  CKMB -- -- --  TROPONINI -- -- --    Estimated Creatinine Clearance: 22 ml/min (by C-G formula based on Cr of 1.32).   Medications:  Scheduled:    . amLODipine  5 mg Oral Daily  . aspirin  81 mg Oral Daily  . atorvastatin  40 mg Oral q1800  . insulin aspart  0-9 Units Subcutaneous TID WC  . levothyroxine  112 mcg Oral QAC breakfast  . mometasone-formoterol  2 puff Inhalation BID  . montelukast  10 mg Oral Daily  . PARoxetine  10 mg Oral Daily  . senna-docusate  2 tablet Oral QHS  . Warfarin - Pharmacist Dosing Inpatient   Does not apply q1800   Infusions:    Assessment: 79 yo female s/p ortho surgery is currently on therapeutic coumadin.  INR today was 2.73. No coumadin yesterday. Goal of Therapy:  INR 2-3    Plan:  1) Coumadin 1mg  po x2 2) INR in am  Cherine Drumgoole, [name] 06/05/2012,8:39 AM

## 2012-06-05 NOTE — Progress Notes (Signed)
Physical Therapy Session Note  Patient Details  Name: [name] MRN: [id number] Date of Birth: January 12, 1924  Today's Date: 06/05/2012 Time: 9562-1308 Time Calculation (min): 58 min  Short Term Goals: Week 1:  PT Short Term Goal 1 (Week 1): Pt will demonstrate bed mobiliity with S PT Short Term Goal 2 (Week 1): Pt will perform functional transfers with S PT Short Term Goal 3 (Week 1): Pt will gait x 100' with S PT Short Term Goal 4 (Week 1): Pt will demonstrate dynamic standing balance with S  Skilled Therapeutic Interventions/Progress Updates:    Pt's daughter present to observe therapy and family education provided in regards to functional transfers and gait (checked daughter off on transfers in the room and gait with pt in hallway with RW. Safety Plan updated) as well as HEP for standing therex. Pt completed 3 sets of 10 reps of each for standing hip abduction, hip extension, hamstring curls, and marches. Education also provided on stair negotiation and curb step maneuvering with RW at overall S level. Gait on unit for endurance and strengthening with S.  Therapy Documentation Precautions:  Precautions Precautions: Fall Precaution Comments: patient with multiple falls in the past  Restrictions Weight Bearing Restrictions: Yes LLE Weight Bearing: Weight bearing as tolerated   Pain:  Reports LLE is still hurting; premedicated.  See FIM for current functional status  Therapy/Group: Individual Therapy  [name] [facility] 06/05/2012, 4:27 PM

## 2012-06-05 NOTE — Progress Notes (Signed)
Physical Therapy Session Note  Patient Details  Name: [name] MRN: [id number] Date of Birth: December 21, 1923  Today's Date: 06/05/2012 Time: 1015-1100 Time Calculation (min): 45 min  Short Term Goals: Week 1:  PT Short Term Goal 1 (Week 1): Pt will demonstrate bed mobiliity with S PT Short Term Goal 2 (Week 1): Pt will perform functional transfers with S PT Short Term Goal 3 (Week 1): Pt will gait x 100' with S PT Short Term Goal 4 (Week 1): Pt will demonstrate dynamic standing balance with S  Skilled Therapeutic Interventions/Progress Updates:    Functional gait and mobility in pt room with RW to/from bathroom with S. Gait in hallway and down to therapy gym for endurance and strengthening with S. Focused on supine therex for ROM/strengthening including heel slides, quad sets, hip abduction, bridging, and LAQ x 3 sets of 10 reps each. Bed mobility with extra time S on flat mat.  Therapy Documentation Precautions:  Precautions Precautions: Fall Precaution Comments: patient with multiple falls in the past  Restrictions Weight Bearing Restrictions: Yes LLE Weight Bearing: Weight bearing as tolerated   Pain:  reports pain in LLE and increased swelling in ankle noted - pt states she already has discussed with RN.  See FIM for current functional status  Therapy/Group: Individual Therapy  [name] [facility] 06/05/2012, 11:00 AM

## 2012-06-05 NOTE — Progress Notes (Signed)
Occupational Therapy Note  Patient Details  Name: [name] MRN: [id number] Date of Birth: 09/13/1923 Today's Date: 06/05/2012  Time: 1130-1158 Pt c/o 6/10 pain in left hip/upper leg with activity; RN aware and repositioned Individual therapy Pt engaged in functional ambulation with RW for home mgmt tasks and practiced tub bench transfers X 3 at supervision level.  Focus on activity tolerance, safety awareness, and dynamic standing balance.   Lavone Neri [facility] 06/05/2012, 11:58 AM

## 2012-06-05 NOTE — Progress Notes (Signed)
Subjective/Complaints: Had a good weekend. Hip a little sore. No new complaitns today. A 12 point review of systems has been performed and if not noted above is otherwise negative.   Objective: Vital Signs: Blood pressure 146/72, pulse 62, temperature 98 F (36.7 C), temperature source Oral, resp. rate 18, height 5' (1.524 m), weight 48 kg (105 lb 13.1 oz), SpO2 100.00%. No results found. No results found for this basename: WBC:2,HGB:2,HCT:2,PLT:2 in the last 72 hours No results found for this basename: NA:2,K:2,CL:2,CO:2,GLUCOSE:2,BUN:2,CREATININE:2,CALCIUM:2 in the last 72 hours CBG (last 3)   Basename 06/05/12 0741 06/04/12 2140 06/04/12 1145  GLUCAP 131* 176* 140*    Wt Readings from Last 3 Encounters:  05/31/12 48 kg (105 lb 13.1 oz)  05/29/12 47.5 kg (104 lb 11.5 oz)  05/29/12 47.5 kg (104 lb 11.5 oz)    Physical Exam:  Constitutional: She is oriented to person, place, and time. NAD  HENT: oral mucosa pink and moist  Head: Normocephalic.  Eyes:  Pupils round and reactive to light  Neck: Normal range of motion. Neck supple. No thyromegaly present.  Cardiovascular: Normal rate and regular rhythm. No M,R,G  Pulmonary/Chest: Effort normal and breath sounds normal. No respiratory distress. No wheezes, rales or rhonchi  Abdominal: Soft. Bowel sounds are normal. She exhibits no distension.  Musculoskeletal: She exhibits no edema.  Left hip appropriately tender. Dressed with minimal serosanginous drainage from wounds Neurological: She is alert and oriented to person, place, and time.  Follows full commands. fxnl UE strength. No sensory deficits except for mild LT and PP loss in the feet. Right LE 3/5 prox to 4+/5 distally/ LLE is 1-2 proximally but 4/5 distally at ankle.  Skin:     Psychiatric: She has a normal mood and affect. Her behavior is normal. Judgment and thought content normal    Assessment/Plan: 1. Functional deficits secondary to left IT hip fx s/p IMN which  require 3+ hours per day of interdisciplinary therapy in a comprehensive inpatient rehab setting. Physiatrist is providing close team supervision and 24 hour management of active medical problems listed below. Physiatrist and rehab team continue to assess barriers to discharge/monitor patient progress toward functional and medical goals. FIM: FIM - Bathing Bathing Steps Patient Completed: Chest;Right Arm;Left Arm;Abdomen;Front perineal area;Buttocks;Right upper leg;Left upper leg Bathing: 4: Min-Patient completes 8-9 39f 10 parts or 75+ percent  FIM - Upper Body Dressing/Undressing Upper body dressing/undressing steps patient completed: Thread/unthread right sleeve of pullover shirt/dresss;Thread/unthread left sleeve of pullover shirt/dress;Pull shirt over trunk;Put head through opening of pull over shirt/dress Upper body dressing/undressing: 5: Supervision: Safety issues/verbal cues FIM - Lower Body Dressing/Undressing Lower body dressing/undressing steps patient completed: Thread/unthread right underwear leg;Pull underwear up/down;Thread/unthread right pants leg;Thread/unthread left pants leg;Pull pants up/down;Fasten/unfasten pants;Don/Doff right sock;Don/Doff right shoe;Don/Doff left shoe;Fasten/unfasten right shoe;Fasten/unfasten left shoe Lower body dressing/undressing: 4: Min-Patient completed 75 plus % of tasks  FIM - Toileting Toileting steps completed by patient: Adjust clothing prior to toileting;Performs perineal hygiene;Adjust clothing after toileting Toileting Assistive Devices: Grab bar or rail for support Toileting: 6: More than reasonable amount of time  FIM - Diplomatic Services operational officer Devices: Elevated toilet seat;Grab bars Toilet Transfers: 5-To toilet/BSC: Supervision (verbal cues/safety issues)  FIM - Banker Devices: Environmental consultant;Arm rests Bed/Chair Transfer: 0: Activity did not occur  FIM - Locomotion:  Wheelchair Locomotion: Wheelchair: 0: Activity did not occur FIM - Locomotion: Ambulation Locomotion: Ambulation Assistive Devices: Designer, industrial/product Ambulation/Gait Assistance: 5: Supervision Locomotion: Ambulation: 5: Travels 150 ft  or more with supervision/safety issues  Comprehension Comprehension Mode: Auditory Comprehension: 6-Follows complex conversation/direction: With extra time/assistive device  Expression Expression Mode: Verbal Expression: 6-Expresses complex ideas: With extra time/assistive device  Social Interaction Social Interaction: 6-Interacts appropriately with others with medication or extra time (anti-anxiety, antidepressant).  Problem Solving Problem Solving: 6-Solves complex problems: With extra time  Memory Memory: 6-More than reasonable amt of time  Medical Problem List and Plan:  1. Left intertrochanteric hip fracture status post intramedullary nail 05/27/2012  2. DVT Prophylaxis/Anticoagulation: Coumadin for DVT prophylaxis per pharmacy protocol 3. Pain Management: MSIR 7.5 mg every 6 hours as needed pain and Robaxin as needed. Monitor with increased mobility  4. Mood: Paxil 10 mg daily.well compensated at present. 5. Neuropsych: This patient is capable of making decisions on his/her own behalf.  6. Postoperative anemia. Transfused 2 units of packed red blood cells 05/29/2012. Recheck this week. 7. hypertension. Norvasc 5 mg daily. Monitor with increased mobility  8. History of asthma.Dulera 2 puffs twice a day, Singulair 10 mg daily. Patient with no complaints of shortness of breath at present. Tolerating physical activity with therapy fairly well. 9. Hypothyroidism. Synthroid  10. Diabetes mellitus. Latest hemoglobin A1c of 6.5. Continue sliding scale insulin. Check blood sugars a.c. and at bedtime. Patient on Glucophage 1000 mg twice a day prior to admission. Sugars under control thus far 11. Chronic renal insufficiency. Cr within normal limits   12.  CAD. Continue aspirin therapy. Patient no complaints of chest pain  13. Hyperlipidemia. Lipitor  14. Recurrent UTI. -follow up ucx negative. abx stopped. 15. Constipation: positive results with adjusted regimen  LOS (Days) 5 A FACE TO FACE EVALUATION WAS PERFORMED  SWARTZ,ZACHARY T 06/05/2012 8:18 AM

## 2012-06-06 ENCOUNTER — Inpatient Hospital Stay (HOSPITAL_COMMUNITY): Payer: Medicare Other | Admitting: Occupational Therapy

## 2012-06-06 ENCOUNTER — Ambulatory Visit: Payer: Medicare Other

## 2012-06-06 ENCOUNTER — Inpatient Hospital Stay (HOSPITAL_COMMUNITY): Payer: Medicare Other

## 2012-06-06 ENCOUNTER — Other Ambulatory Visit: Payer: Medicare Other | Admitting: Lab

## 2012-06-06 LAB — GLUCOSE, CAPILLARY: Glucose-Capillary: 105 mg/dL — ABNORMAL HIGH (ref 70–99)

## 2012-06-06 LAB — PROTIME-INR
INR: 2 — ABNORMAL HIGH (ref 0.00–1.49)
Prothrombin Time: 21.9 seconds — ABNORMAL HIGH (ref 11.6–15.2)

## 2012-06-06 MED ORDER — WARFARIN SODIUM 2.5 MG PO TABS
2.5000 mg | ORAL_TABLET | Freq: Once | ORAL | Status: AC
  Administered 2012-06-06: 2.5 mg via ORAL
  Filled 2012-06-06: qty 1

## 2012-06-06 NOTE — Progress Notes (Signed)
ANTICOAGULATION CONSULT NOTE - Follow Up Consult  Pharmacy Consult for coumadin Indication: VTE prophylaxis  Allergies  Allergen Reactions  . Codeine Anaphylaxis and Swelling  . Penicillins Anaphylaxis and Swelling  . Sulfonamide Derivatives Anaphylaxis and Swelling    Patient Measurements: Height: 5' (152.4 cm) Weight: 105 lb 13.1 oz (48 kg) IBW/kg (Calculated) : 45.5  Heparin Dosing Weight:   Vital Signs: Temp: 98 F (36.7 C) (01/21 0640) Temp src: Oral (01/21 0640) BP: 126/75 mmHg (01/21 0640) Pulse Rate: 77  (01/21 0640)  Labs:  [name] 06/06/12 0650 06/05/12 0625 06/04/12 0526  HGB -- -- --  HCT -- -- --  PLT -- -- --  APTT -- -- --  LABPROT 21.9* 27.6* 29.0*  INR 2.00* 2.73* 2.92*  HEPARINUNFRC -- -- --  CREATININE -- -- --  CKTOTAL -- -- --  CKMB -- -- --  TROPONINI -- -- --    Estimated Creatinine Clearance: 22 ml/min (by C-G formula based on Cr of 1.32).   Medications:  Scheduled:    . amLODipine  5 mg Oral Daily  . aspirin  81 mg Oral Daily  . atorvastatin  40 mg Oral q1800  . insulin aspart  0-9 Units Subcutaneous TID WC  . levothyroxine  112 mcg Oral QAC breakfast  . mometasone-formoterol  2 puff Inhalation BID  . montelukast  10 mg Oral Daily  . PARoxetine  10 mg Oral Daily  . senna-docusate  2 tablet Oral QHS  . [COMPLETED] warfarin  1 mg Oral ONCE-1800  . Warfarin - Pharmacist Dosing Inpatient   Does not apply q1800   Infusions:    Assessment: 79 yo female s/p ortho surgery is currently on therapeutic coumadin.  INR is down to 2 from 2.78.  Probably due to no dose on 01/19.  Off cipro now. Goal of Therapy:  INR 2-3    Plan:  1) Coumadin 2.5mg  po x1 2) Daily PT/INR  [name], [name] 06/06/2012,11:47 AM

## 2012-06-06 NOTE — Progress Notes (Signed)
Occupational Therapy Session Note  Patient Details  Name: [name] MRN: [id number] Date of Birth: 11/12/1923  Today's Date: 06/06/2012 Time: 0805-0900 Time Calculation (min): 55 min  Short Term Goals: Week 1:  OT Short Term Goal 1 (Week 1): Patient will perform UB/LB bathing at supervision level OT Short Term Goal 2 (Week 1): Patient will perform LB dressing at supervision level OT Short Term Goal 3 (Week 1): Patient will perform toileting and toilet transfer at supervision level OT Short Term Goal 4 (Week 1): Patient will perform grooming tasks standing at sink with supervision  Skilled Therapeutic Interventions/Progress Updates:      Pt seen for BADL retraining of toileting, bathing at shower level, and dressing with a focus on standing balance and safe functional mobility. Pt was able to ambulate to drawers to retrieve clothing and to bathroom with RW with supervision. Bathed in shower with no LOB and was able to dress from tub bench.  Ted hose applied. She had increased difficulty with both shoes and may benefit from a shoe horn and shoe button to avoid tying shoes.  Good activity tolerance this session.  Pt had to use bathroom for a second time after session ended so pt was left on toilet.  She stated she would call for help and the NT was notified.  Therapy Documentation Precautions:  Precautions Precautions: Fall Precaution Comments: patient with multiple falls in the past  Restrictions Weight Bearing Restrictions: Yes LLE Weight Bearing: Weight bearing as tolerated    Pain: Pt states she is in 5/10 pain in hip and she had received her pain medication prior to therapy.   ADL: See FIM for current functional status  Therapy/Group: Individual Therapy  SAGUIER,JULIA 06/06/2012, 10:13 AM

## 2012-06-06 NOTE — Progress Notes (Signed)
Occupational Therapy Session Note  Patient Details  Name: [name] MRN: [id number] Date of Birth: [birth year]  Today's Date: 06/06/2012 Time: 1191-4782 Time Calculation (min): 40 min  Short Term Goals: Week 1:  OT Short Term Goal 1 (Week 1): Patient will perform UB/LB bathing at supervision level OT Short Term Goal 2 (Week 1): Patient will perform LB dressing at supervision level OT Short Term Goal 3 (Week 1): Patient will perform toileting and toilet transfer at supervision level OT Short Term Goal 4 (Week 1): Patient will perform grooming tasks standing at sink with supervision  Skilled Therapeutic Interventions/Progress Updates:  Patient found seated in w/c with minimal complaints of pain, but no medication wanted at this time. Patient engaged in toilet transfer (ambulating <-> bathroom), toileting, and grooming tasks standing at sink. Patient then ambulated from room -> ADL apartment for simple meal prep in kitchen. Focused skilled intervention on functional mobility/ambulation using rolling walker, overall activity tolerance/endurance, safety with rolling walker during kitchen tasks, sit/stands from various surfaces including furniture, and dynamic standing balance/tolerance/endurance. At end of session therapist propelled patient back to room and left seated in w/c with call bell & phone within reach.   Precautions:  Precautions Precautions: Fall Precaution Comments: patient with multiple falls in the past  Restrictions Weight Bearing Restrictions: Yes LLE Weight Bearing: Weight bearing as tolerated  See FIM for current functional status  Therapy/Group: Individual Therapy  [name] 06/06/2012, 3:29 PM

## 2012-06-06 NOTE — Progress Notes (Signed)
Physical Therapy Session Note  Patient Details  Name: MUSLIMA TOPPINS MRN: [id number] Date of Birth: 1923-07-06  Today's Date: 06/06/2012 Time: 1330-1400 Time Calculation (min): 30 min  Short Term Goals: Week 1:  PT Short Term Goal 1 (Week 1): Pt will demonstrate bed mobiliity with S PT Short Term Goal 2 (Week 1): Pt will perform functional transfers with S PT Short Term Goal 3 (Week 1): Pt will gait x 100' with S PT Short Term Goal 4 (Week 1): Pt will demonstrate dynamic standing balance with S  Skilled Therapeutic Interventions/Progress Updates:    Pt missed first 30 minutes of session due to family bringing her lunch and wanting to visit; family brought pt to session late. Participated in gait group with focus on dynamic gait through obstacle course, strengthening and endurance on Nustep x 6 minutes and seated therex during rest breaks. Pt overall S with all mobility.   Therapy Documentation Precautions:  Precautions Precautions: Fall Precaution Comments: patient with multiple falls in the past  Restrictions Weight Bearing Restrictions: Yes LLE Weight Bearing: Weight bearing as tolerated General: Amount of Missed PT Time (min): 30 Minutes Pain: No complaints.  Locomotion : Ambulation Ambulation/Gait Assistance: 5: Supervision   See FIM for current functional status  Therapy/Group: Group Therapy  Tedd Sias 06/06/2012, 2:13 PM

## 2012-06-06 NOTE — Progress Notes (Signed)
Subjective/Complaints: No complaints. Feeling well.  A 12 point review of systems has been performed and if not noted above is otherwise negative.   Objective: Vital Signs: Blood pressure 126/75, pulse 77, temperature 98 F (36.7 C), temperature source Oral, resp. rate 18, height 5' (1.524 m), weight 48 kg (105 lb 13.1 oz), SpO2 87.00%. No results found. No results found for this basename: WBC:2,HGB:2,HCT:2,PLT:2 in the last 72 hours No results found for this basename: NA:2,K:2,CL:2,CO:2,GLUCOSE:2,BUN:2,CREATININE:2,CALCIUM:2 in the last 72 hours CBG (last 3)   Basename 06/06/12 0745 06/05/12 2133 06/05/12 1736  GLUCAP 117* 106* 158*    Wt Readings from Last 3 Encounters:  05/31/12 48 kg (105 lb 13.1 oz)  05/29/12 47.5 kg (104 lb 11.5 oz)  05/29/12 47.5 kg (104 lb 11.5 oz)    Physical Exam:  Constitutional: She is oriented to person, place, and time. NAD  HENT: oral mucosa pink and moist  Head: Normocephalic.  Eyes:  Pupils round and reactive to light  Neck: Normal range of motion. Neck supple. No thyromegaly present.  Cardiovascular: Normal rate and regular rhythm. No M,R,G  Pulmonary/Chest: Effort normal and breath sounds normal. No respiratory distress. No wheezes, rales or rhonchi  Abdominal: Soft. Bowel sounds are normal. She exhibits no distension.  Musculoskeletal: She exhibits no edema.  Left hip appropriately tender. Dressed with minimal serosanginous drainage from wounds Neurological: She is alert and oriented to person, place, and time.  Follows full commands. fxnl UE strength. No sensory deficits except for mild LT and PP loss in the feet. Right LE 3/5 prox to 4+/5 distally/ LLE is 1-2 proximally but 4/5 distally at ankle.  Skin:     Psychiatric: She has a normal mood and affect. Her behavior is normal. Judgment and thought content normal    Assessment/Plan: 1. Functional deficits secondary to left IT hip fx s/p IMN which require 3+ hours per day of  interdisciplinary therapy in a comprehensive inpatient rehab setting. Physiatrist is providing close team supervision and 24 hour management of active medical problems listed below. Physiatrist and rehab team continue to assess barriers to discharge/monitor patient progress toward functional and medical goals. FIM: FIM - Bathing Bathing Steps Patient Completed: Chest;Right Arm;Left Arm;Abdomen;Front perineal area;Buttocks;Right upper leg;Left upper leg;Right lower leg (including foot);Left lower leg (including foot) Bathing: 5: Supervision: Safety issues/verbal cues  FIM - Upper Body Dressing/Undressing Upper body dressing/undressing steps patient completed: Thread/unthread right sleeve of pullover shirt/dresss;Thread/unthread left sleeve of pullover shirt/dress;Put head through opening of pull over shirt/dress;Pull shirt over trunk Upper body dressing/undressing: 5: Set-up assist to: Obtain clothing/put away FIM - Lower Body Dressing/Undressing Lower body dressing/undressing steps patient completed: Thread/unthread right underwear leg;Thread/unthread left underwear leg;Pull underwear up/down;Thread/unthread right pants leg;Thread/unthread left pants leg;Pull pants up/down;Don/Doff right sock;Don/Doff right shoe;Fasten/unfasten right shoe Lower body dressing/undressing: 4: Min-Patient completed 75 plus % of tasks  FIM - Toileting Toileting steps completed by patient: Adjust clothing prior to toileting;Performs perineal hygiene;Adjust clothing after toileting Toileting Assistive Devices: Grab bar or rail for support Toileting: 5: Supervision: Safety issues/verbal cues  FIM - Diplomatic Services operational officer Devices: Elevated toilet seat;Walker;Grab bars Toilet Transfers: 5-To toilet/BSC: Supervision (verbal cues/safety issues);5-From toilet/BSC: Supervision (verbal cues/safety issues)  FIM - Banker Devices: Walker;Arm rests Bed/Chair  Transfer: 5: Supine > Sit: Supervision (verbal cues/safety issues);5: Sit > Supine: Supervision (verbal cues/safety issues);5: Bed > Chair or W/C: Supervision (verbal cues/safety issues);5: Chair or W/C > Bed: Supervision (verbal cues/safety issues)  FIM - Locomotion: Wheelchair Locomotion:  Wheelchair: 2: Travels 50 - 149 ft with supervision, cueing or coaxing FIM - Locomotion: Ambulation Locomotion: Ambulation Assistive Devices: Designer, industrial/product Ambulation/Gait Assistance: 5: Supervision Locomotion: Ambulation: 5: Travels 150 ft or more with supervision/safety issues  Comprehension Comprehension Mode: Auditory Comprehension: 6-Follows complex conversation/direction: With extra time/assistive device  Expression Expression Mode: Verbal Expression: 6-Expresses complex ideas: With extra time/assistive device  Social Interaction Social Interaction: 6-Interacts appropriately with others with medication or extra time (anti-anxiety, antidepressant).  Problem Solving Problem Solving: 6-Solves complex problems: With extra time  Memory Memory: 6-More than reasonable amt of time  Medical Problem List and Plan:  1. Left intertrochanteric hip fracture status post intramedullary nail 05/27/2012  2. DVT Prophylaxis/Anticoagulation: Coumadin for DVT prophylaxis per pharmacy protocol 3. Pain Management: MSIR 7.5 mg every 6 hours as needed pain and Robaxin as needed. Monitor with increased mobility  4. Mood: Paxil 10 mg daily.well compensated at present. 5. Neuropsych: This patient is capable of making decisions on his/her own behalf.  6. Postoperative anemia. Transfused 2 units of packed red blood cells 05/29/2012. Recheck tomorrow 7. hypertension. Norvasc 5 mg daily. Monitor with increased mobility  8. History of asthma.Dulera 2 puffs twice a day, Singulair 10 mg daily. Patient with no complaints of shortness of breath at present. Tolerating physical activity with therapy fairly well. 9.  Hypothyroidism. Synthroid  10. Diabetes mellitus. Latest hemoglobin A1c of 6.5. Continue sliding scale insulin. Check blood sugars a.c. and at bedtime. Patient on Glucophage 1000 mg twice a day prior to admission. Sugars under control thus far 11. Chronic renal insufficiency. Cr within normal limits   12. CAD. Continue aspirin therapy. Patient no complaints of chest pain  13. Hyperlipidemia. Lipitor  14. Recurrent UTI. -follow up ucx negative. abx stopped. 15. Constipation: working on regular schedule  LOS (Days) 6 A FACE TO FACE EVALUATION WAS PERFORMED  SWARTZ,ZACHARY T 06/06/2012 8:11 AM

## 2012-06-06 NOTE — Progress Notes (Signed)
Physical Therapy Note  Patient Details  Name: [name] MRN: [id number] Date of Birth: 1924/01/14 Today's Date: 06/06/2012  9:45 - 10:20 35 minutes Individual session Patient reports pain at 7 in left hip. RN just gave meds.  Treatment focused on gait and balance. Patient ambulated 180 feet with rolling walker and supervision. Patient ambulated up and down curb with RW and supervision and verbal cueing for sequencing. Patient ambulated up and down 5 steps with bilateral rails and supervision. Patient worked on dynamic standing balance without assistive device reaching for rings and placing on cones in a diagonal pattern. Patient ambulated with rolling walker back to room 280 feet with supervision. Patient needed to use restroom. Patient using grab bar was modified independent with pulling down pants. Patient left on toilet and told to call nurse tech for assistance when finished.    Arelia Longest M 06/06/2012, 12:22 PM

## 2012-06-07 ENCOUNTER — Inpatient Hospital Stay (HOSPITAL_COMMUNITY): Payer: Medicare Other | Admitting: Occupational Therapy

## 2012-06-07 ENCOUNTER — Inpatient Hospital Stay (HOSPITAL_COMMUNITY): Payer: Medicare Other

## 2012-06-07 ENCOUNTER — Inpatient Hospital Stay (HOSPITAL_COMMUNITY): Payer: Medicare Other | Admitting: *Deleted

## 2012-06-07 LAB — CBC
HCT: 32.4 % — ABNORMAL LOW (ref 36.0–46.0)
MCV: 94.7 fL (ref 78.0–100.0)
RBC: 3.42 MIL/uL — ABNORMAL LOW (ref 3.87–5.11)
RDW: 16.6 % — ABNORMAL HIGH (ref 11.5–15.5)
WBC: 10.1 10*3/uL (ref 4.0–10.5)

## 2012-06-07 LAB — BASIC METABOLIC PANEL
BUN: 25 mg/dL — ABNORMAL HIGH (ref 6–23)
Chloride: 105 mEq/L (ref 96–112)
GFR calc Af Amer: 45 mL/min — ABNORMAL LOW (ref >90)
GFR calc non Af Amer: 39 mL/min — ABNORMAL LOW (ref >90)
Glucose, Bld: 135 mg/dL — ABNORMAL HIGH (ref 70–99)
Potassium: 3.8 mEq/L (ref 3.5–5.1)
Sodium: 141 mEq/L (ref 135–145)

## 2012-06-07 LAB — GLUCOSE, CAPILLARY
Glucose-Capillary: 117 mg/dL — ABNORMAL HIGH (ref 70–99)
Glucose-Capillary: 167 mg/dL — ABNORMAL HIGH (ref 70–99)

## 2012-06-07 LAB — PROTIME-INR: INR: 1.82 — ABNORMAL HIGH (ref 0.00–1.49)

## 2012-06-07 MED ORDER — WARFARIN SODIUM 2.5 MG PO TABS
2.5000 mg | ORAL_TABLET | Freq: Once | ORAL | Status: AC
  Administered 2012-06-07: 2.5 mg via ORAL
  Filled 2012-06-07: qty 1

## 2012-06-07 NOTE — Progress Notes (Signed)
Occupational Therapy Session Note  Patient Details  Name: [name] MRN: [id number] Date of Birth: October 16, 1923  Today's Date: 06/07/2012 Time: 1308-6578 Time Calculation (min): 31 min  Skilled Therapeutic Interventions/Progress Updates:    Took pt to the ADL apartment to practice tub/shower transfers.  Pt's daughter came and observed session as well.  Pt able to perform the transfer with close supervision but need min instructional cueing for hand placement during sit to stand.  She forgets and attempts to pull up on the walker.  Also performed education in the kitchen with regards to planning and organizing an activity and using the counter tops and RW bag to help transport items.  Had pt replace items in the upper and lower cabinets and place items back in the refrigerator, using the RW.  Pt needing min instructional cueing to turn the walker with her when reaching into the cabinets and not to leave the walker turned to the side.  Pt reports she may go to her daughters home and stay during the day.    Therapy Documentation Precautions:  Precautions Precautions: Fall Precaution Comments: patient with multiple falls in the past  Restrictions Weight Bearing Restrictions: No LLE Weight Bearing: Weight bearing as tolerated  Pain: Pain Assessment Pain Assessment: Faces Faces Pain Scale: Hurts a little bit Pain Type: Acute pain Pain Intervention(s): Repositioned  See FIM for current functional status  Therapy/Group: Individual Therapy  [name] OTR/L 06/07/2012, 3:20 PM

## 2012-06-07 NOTE — Patient Care Conference (Signed)
Inpatient RehabilitationTeam Conference and Plan of Care Update Date: 06/06/2012   Time: 2:25 PM    Patient Name: [name]      Medical Record Number: [id number]  Date of Birth: 04/09/1924 Sex: Female         Room/Bed: 4039/4039-01 Payor Info: Payor: [employer]  Plan: BLUE MEDICARE  Product Type: *No Product type*     Admitting Diagnosis: L HIP FX  Admit Date/Time:  05/31/2012  4:23 PM Admission Comments: No comment available   Primary Diagnosis:  Hip fracture Principal Problem: Hip fracture  Patient Active Problem List   Diagnosis Date Noted  . Hip fracture 05/31/2012  . Closed intertrochanteric fracture of left hip 05/27/2012  . Atypical chest pain 03/28/2012  . Headache 03/02/2012  . Bronchitis, acute 12/20/2011  . Pacemaker-Medtronic 10/05/2011  . Cataract extraction status of right eye 08/18/2011  . Anxiety and depression 08/18/2011  . Bladder cancer 07/18/2011  . Sinusitis 07/18/2011  . Anemia 07/18/2011  . Arthritis 07/18/2011  . Diabetes mellitus 07/16/2011  . Allergic state 07/16/2011  . Insomnia 07/16/2011  . Recurrent UTI 07/16/2011  . NSVT (nonsustained ventricular tachycardia) 04/06/2011  . Dizziness 04/06/2011  . CAD 04/06/2011  . Myelodysplastic syndrome 04/02/2011  . SECOND DEGREE AV BLOCK, MOBITZ II 11/05/2008  . VENTRICULAR TACHYCARDIA 11/05/2008  . HYPOTENSION, ORTHOSTATIC 11/03/2008  . HYPOTHYROIDISM 10/31/2008  . HYPERLIPIDEMIA-MIXED 10/31/2008  . HYPERTENSION, UNSPECIFIED 10/31/2008  . CAROTID ARTERY STENOSIS, WITHOUT INFARCTION 10/31/2008  . PVD 10/31/2008  . RENAL FAILURE, CHRONIC 10/31/2008    Expected Discharge Date: Expected Discharge Date: 06/10/12  Team Members Present: Physician leading conference: Dr. Faith Rogue Social Worker Present: Amada Jupiter, LCSW Nurse Present: Daryll Brod, RN PT Present: [name], PT;Other (comment) Corinna Capra., PT) OT Present: Edwin Cap, OT Other (Discipline  and Name): Tora Duck, PPS Coordinator     Current Status/Progress Goal Weekly Team Focus  Medical   left hip fx, swelling, copd  pain control, increase stamina and balance  increae safety and functional independence   Bowel/Bladder   Continent of bowel and bladder. LBM 06/05/12  Pt to remain continent   Monitor   Swallow/Nutrition/ Hydration             ADL's   supervision -> min assist  overall mod I  ADL retraining, ADL transfers, dynamic standing, d/c planning    Mobility   S overall  mod I overall; S stairs  d/c planning, possible outing, dynamic gait and balance, functional strengthening   Communication             Safety/Cognition/ Behavioral Observations            Pain   Tylenol 650mg  q 4hrs  <3  offer pain medication 1 hr prior to initial therapy   Skin   L hip incision x 2 with steristrips and allevyn dressing  No additional skin breakdown  Monitor    Rehab Goals Patient on target to meet rehab goals: Yes *See Interdisciplinary Assessment and Plan and progress notes for long and short-term goals  Barriers to Discharge: none    Possible Resolutions to Barriers:  n/a    Discharge Planning/Teaching Needs:  home with intermittent assistance of family      Team Discussion:  Making good progress toward mod i goals.  Family has been cleared to assist pt in the room.  No concerns at this time.  Revisions to Treatment Plan:  No changes   Continued Need  for Acute Rehabilitation Level of Care: The patient requires daily medical management by a physician with specialized training in physical medicine and rehabilitation for the following conditions: Daily direction of a multidisciplinary physical rehabilitation program to ensure safe treatment while eliciting the highest outcome that is of practical value to the patient.: Yes Daily medical management of patient stability for increased activity during participation in an intensive rehabilitation regime.: Yes Daily  analysis of laboratory values and/or radiology reports with any subsequent need for medication adjustment of medical intervention for : Post surgical problems;Pulmonary problems  Khyan Oats 06/07/2012, 11:44 AM

## 2012-06-07 NOTE — Progress Notes (Signed)
Physical Therapy Note  Patient Details  Name: TANGALA WIEGERT MRN: [id number] Date of Birth: [birth year] Today's Date: 06/07/2012  4098-1191 (55 minutes) double Pain: 7/10 left hip /denies need for pain meds Focus of treatment: gait training/endurance; bilateral LE strengthening / AROM Treatment: Gait - 100 feet RW X 1 SBA; bilateral LE AROM/strengthening in supine X 20 - ankle pumps, heel slides, hip abduction, SAQs, standing hip flexion using RW support.; sit to stand from mat 2 X 5 with vcs not to use seating surface for quad assist during sit to stand; gait 55 feet X 1 RW SBA (distance limited by c/o fatigue).    Cammy Sanjurjo,JIM 06/07/2012, 7:26 AM

## 2012-06-07 NOTE — Progress Notes (Signed)
Subjective/Complaints: Still occasional leg swelling. No complaints. A little anxious about going home Saturday. Not sure if she'll be ready.  A 12 point review of systems has been performed and if not noted above is otherwise negative.   Objective: Vital Signs: Blood pressure 126/61, pulse 67, temperature 98 F (36.7 C), temperature source Oral, resp. rate 18, height 5' (1.524 m), weight 48 kg (105 lb 13.1 oz), SpO2 97.00%. No results found.  Basename 06/07/12 0600  WBC 10.1  HGB 10.4*  HCT 32.4*  PLT 204    Basename 06/07/12 0600  NA 141  K 3.8  CL 105  GLUCOSE 135*  BUN 25*  CREATININE 1.23*  CALCIUM 9.1   CBG (last 3)   Basename 06/07/12 0728 06/06/12 2148 06/06/12 1654  GLUCAP 117* 142* 172*    Wt Readings from Last 3 Encounters:  05/31/12 48 kg (105 lb 13.1 oz)  05/29/12 47.5 kg (104 lb 11.5 oz)  05/29/12 47.5 kg (104 lb 11.5 oz)    Physical Exam:  Constitutional: She is oriented to person, place, and time. NAD  HENT: oral mucosa pink and moist  Head: Normocephalic.  Eyes:  Pupils round and reactive to light  Neck: Normal range of motion. Neck supple. No thyromegaly present.  Cardiovascular: Normal rate and regular rhythm. No M,R,G  Pulmonary/Chest: Effort normal and breath sounds normal. No respiratory distress. No wheezes, rales or rhonchi  Abdominal: Soft. Bowel sounds are normal. She exhibits no distension.  Musculoskeletal: She exhibits no edema.  Left hip appropriately tender. Dressed with minimal serosanginous drainage from wounds Neurological: She is alert and oriented to person, place, and time.  Follows full commands. fxnl UE strength. No sensory deficits except for mild LT and PP loss in the feet. Right LE 3/5 prox to 4+/5 distally/ LLE is 1-2 proximally but 4/5 distally at ankle.  Skin:     Psychiatric: She has a normal mood and affect. Her behavior is normal. Judgment and thought content normal    Assessment/Plan: 1. Functional deficits  secondary to left IT hip fx s/p IMN which require 3+ hours per day of interdisciplinary therapy in a comprehensive inpatient rehab setting. Physiatrist is providing close team supervision and 24 hour management of active medical problems listed below. Physiatrist and rehab team continue to assess barriers to discharge/monitor patient progress toward functional and medical goals. FIM: FIM - Bathing Bathing Steps Patient Completed: Chest;Right Arm;Left Arm;Abdomen;Front perineal area;Buttocks;Right upper leg;Left upper leg;Right lower leg (including foot);Left lower leg (including foot) Bathing: 5: Supervision: Safety issues/verbal cues  FIM - Upper Body Dressing/Undressing Upper body dressing/undressing steps patient completed: Thread/unthread right sleeve of pullover shirt/dresss;Thread/unthread left sleeve of pullover shirt/dress;Put head through opening of pull over shirt/dress;Pull shirt over trunk Upper body dressing/undressing: 5: Supervision: Safety issues/verbal cues (retrieved clothing from drawers) FIM - Lower Body Dressing/Undressing Lower body dressing/undressing steps patient completed: Thread/unthread right underwear leg;Thread/unthread left underwear leg;Pull underwear up/down;Thread/unthread right pants leg;Thread/unthread left pants leg;Pull pants up/down Lower body dressing/undressing: 4: Min-Patient completed 75 plus % of tasks  FIM - Toileting Toileting steps completed by patient: Adjust clothing prior to toileting;Adjust clothing after toileting;Performs perineal hygiene Toileting Assistive Devices: Grab bar or rail for support Toileting: 5: Supervision: Safety issues/verbal cues  FIM - Diplomatic Services operational officer Devices: Art gallery manager Transfers: 5-To toilet/BSC: Supervision (verbal cues/safety issues);5-From toilet/BSC: Supervision (verbal cues/safety issues)  FIM - Banker Devices: Walker;Arm rests Bed/Chair  Transfer: 5: Supine > Sit: Supervision (verbal cues/safety issues);5: Sit >  Supine: Supervision (verbal cues/safety issues);5: Bed > Chair or W/C: Supervision (verbal cues/safety issues);5: Chair or W/C > Bed: Supervision (verbal cues/safety issues)  FIM - Locomotion: Wheelchair Locomotion: Wheelchair: 2: Travels 50 - 149 ft with supervision, cueing or coaxing FIM - Locomotion: Ambulation Locomotion: Ambulation Assistive Devices: Designer, industrial/product Ambulation/Gait Assistance: 5: Supervision Locomotion: Ambulation: 5: Travels 150 ft or more with supervision/safety issues  Comprehension Comprehension Mode: Auditory Comprehension: 6-Follows complex conversation/direction: With extra time/assistive device  Expression Expression Mode: Verbal Expression: 6-Expresses complex ideas: With extra time/assistive device  Social Interaction Social Interaction: 6-Interacts appropriately with others with medication or extra time (anti-anxiety, antidepressant).  Problem Solving Problem Solving: 6-Solves complex problems: With extra time  Memory Memory: 6-More than reasonable amt of time  Medical Problem List and Plan:  1. Left intertrochanteric hip fracture status post intramedullary nail 05/27/2012  2. DVT Prophylaxis/Anticoagulation: Coumadin for DVT prophylaxis per pharmacy protocol 3. Pain Management: MSIR 7.5 mg every 6 hours as needed pain and Robaxin as needed. Monitor with increased mobility  4. Mood: Paxil 10 mg daily.well compensated at present. 5. Neuropsych: This patient is capable of making decisions on his/her own behalf.  6. Postoperative anemia. Transfused 2 units of packed red blood cells 05/29/2012. Recheck 10.4 today 7. hypertension. Norvasc 5 mg daily. Monitor with increased mobility  8. History of asthma.Dulera 2 puffs twice a day, Singulair 10 mg daily. Patient with no complaints of shortness of breath at present. Tolerating physical activity with therapy fairly well. 9.  Hypothyroidism. Synthroid  10. Diabetes mellitus. Latest hemoglobin A1c of 6.5. Continue sliding scale insulin. Check blood sugars a.c. and at bedtime. Patient on Glucophage 1000 mg twice a day prior to admission. Sugars under control thus far 11. Chronic renal insufficiency. BUN and Cr near normal. 12. CAD. Continue aspirin therapy. Patient no complaints of chest pain  13. Hyperlipidemia. Lipitor  14. Recurrent UTI. -follow up ucx negative. abx stopped. 15. Constipation: working on regular scheduled bm  LOS (Days) 7 A FACE TO FACE EVALUATION WAS PERFORMED  Nadya Hopwood T 06/07/2012 8:45 AM

## 2012-06-07 NOTE — Progress Notes (Signed)
Occupational Therapy Note  Patient Details  Name: [name] MRN: [id number] Date of Birth: 1923-11-28 Today's Date: 06/07/2012  Time: 10:30am-11:25am (55 min.)  1:1 session, pt seen for functional mobility and safety awareness during ADL routine. Pt showered and then completed grooming at sink. Pt able to stand x 1-2 minutes during functional activities (oral hygiene) with no c/o pain throughout session. Pt required verbal cue to not cross legs during dressing. Ended session with pt sitting in wheelchair to complete grooming activities at sink.     Semir Brill Hessie Diener 06/07/2012, 11:38 AM

## 2012-06-07 NOTE — Progress Notes (Signed)
Physical Therapy Session Note  Patient Details  Name: [name] MRN: [id number] Date of Birth: 06-08-1923  Today's Date: 06/07/2012  Short Term Goals: Week 1:  PT Short Term Goal 1 (Week 1): Pt will demonstrate bed mobiliity with S PT Short Term Goal 2 (Week 1): Pt will perform functional transfers with S PT Short Term Goal 3 (Week 1): Pt will gait x 100' with S PT Short Term Goal 4 (Week 1): Pt will demonstrate dynamic standing balance with S  Session #1 Time: 1430-1500 Time Calculation (min): 30 min Reports L hip still painful but declines need for medication; focused on gait for endurance and strengthening on unit with S to/from therapy gym and pt room. Dynamic standing balance activity on compliant surface while folding towels and reaching behind to R and L to challenge balance; completed with close S.   Session #2: Time: 6644-0347 Time Calcuation (min): 30 min No complaints of pain. W/c propulsion with UEs and intermittent use of LE's to therapy gym. Nustep for functional strengthening and endurance on level 4 x 10 minutes using bilateral UEs and LE's. S with transfers; declined gait back to room due to fatigue. S gait into bathroom with RW; pt verbalized that she will call for assistance when finished.   Therapy Documentation Precautions:  Precautions Precautions: Fall Precaution Comments: patient with multiple falls in the past  Restrictions Weight Bearing Restrictions: No LLE Weight Bearing: Weight bearing as tolerated    Locomotion : Ambulation Ambulation/Gait Assistance: 5: Supervision     See FIM for current functional status  Therapy/Group: Individual Therapy  [name] [facility] 06/07/2012, 4:02 PM

## 2012-06-07 NOTE — Progress Notes (Signed)
Social Work Patient ID: [name], female   DOB: Nov 05, 1923, 79 y.o.   MRN: [id number]  Met yesterday afternoon with patient to review team conference.  Pt understands targeted d/c set for 1/25, however, admits feeling like this may be "too soon". She agrees that she is making good progress and hopeful she will feel better about this date over the next couple of days.  Plan to follow up with pt tomorrow to see how she is doing and if she has any more concerns about d/c.  Also need to follow up with family.    [name] Thieme, LCSW

## 2012-06-07 NOTE — Progress Notes (Signed)
ANTICOAGULATION CONSULT NOTE - Follow Up Consult  Pharmacy Consult for coumadin Indication: VTE prophylaxis  Allergies  Allergen Reactions  . Codeine Anaphylaxis and Swelling  . Penicillins Anaphylaxis and Swelling  . Sulfonamide Derivatives Anaphylaxis and Swelling    Patient Measurements: Height: 5' (152.4 cm) Weight: 105 lb 13.1 oz (48 kg) IBW/kg (Calculated) : 45.5  Heparin Dosing Weight:   Vital Signs: Temp: 98 F (36.7 C) (01/22 0425) Temp src: Oral (01/22 0425) BP: 126/61 mmHg (01/22 0425) Pulse Rate: 67  (01/22 0425)  Labs:  Basename 06/07/12 0600 06/06/12 0650 06/05/12 0625  HGB 10.4* -- --  HCT 32.4* -- --  PLT 204 -- --  APTT -- -- --  LABPROT 20.4* 21.9* 27.6*  INR 1.82* 2.00* 2.73*  HEPARINUNFRC -- -- --  CREATININE 1.23* -- --  CKTOTAL -- -- --  CKMB -- -- --  TROPONINI -- -- --    Estimated Creatinine Clearance: 23.6 ml/min (by C-G formula based on Cr of 1.23).   Medications:  Scheduled:    . amLODipine  5 mg Oral Daily  . aspirin  81 mg Oral Daily  . atorvastatin  40 mg Oral q1800  . insulin aspart  0-9 Units Subcutaneous TID WC  . levothyroxine  112 mcg Oral QAC breakfast  . mometasone-formoterol  2 puff Inhalation BID  . montelukast  10 mg Oral Daily  . PARoxetine  10 mg Oral Daily  . senna-docusate  2 tablet Oral QHS  . [COMPLETED] warfarin  2.5 mg Oral ONCE-1800  . Warfarin - Pharmacist Dosing Inpatient   Does not apply q1800   Infusions:    Assessment: 79 yo female s/p ortho surgery is currently on subtherapeutic coumadin.  INR down to 1.82 from 2 (probably due to 1mg  she received 2 days ago).   Goal of Therapy:  INR 2-3    Plan:  1) Coumadin 2.5mg  po x1 2) INR in am.  Bodhi Moradi, [name] 06/07/2012,9:22 AM

## 2012-06-08 ENCOUNTER — Inpatient Hospital Stay (HOSPITAL_COMMUNITY): Payer: Medicare Other | Admitting: Occupational Therapy

## 2012-06-08 ENCOUNTER — Inpatient Hospital Stay (HOSPITAL_COMMUNITY): Payer: Medicare Other

## 2012-06-08 LAB — GLUCOSE, CAPILLARY
Glucose-Capillary: 123 mg/dL — ABNORMAL HIGH (ref 70–99)
Glucose-Capillary: 133 mg/dL — ABNORMAL HIGH (ref 70–99)
Glucose-Capillary: 197 mg/dL — ABNORMAL HIGH (ref 70–99)

## 2012-06-08 LAB — PROTIME-INR: Prothrombin Time: 22.8 seconds — ABNORMAL HIGH (ref 11.6–15.2)

## 2012-06-08 MED ORDER — WARFARIN SODIUM 2.5 MG PO TABS
2.5000 mg | ORAL_TABLET | Freq: Once | ORAL | Status: AC
  Administered 2012-06-08: 2.5 mg via ORAL
  Filled 2012-06-08: qty 1

## 2012-06-08 MED ORDER — METFORMIN HCL 500 MG PO TABS
500.0000 mg | ORAL_TABLET | Freq: Two times a day (BID) | ORAL | Status: DC
  Administered 2012-06-08 – 2012-06-10 (×4): 500 mg via ORAL
  Filled 2012-06-08 (×6): qty 1

## 2012-06-08 NOTE — Progress Notes (Signed)
Social Work Patient ID: [name], female   DOB: [birth year], 79 y.o.   MRN: [id number]  Received approval from [name] today for coverage through d/c of 06/10/12.  [name], LCSW

## 2012-06-08 NOTE — Progress Notes (Signed)
Subjective/Complaints: No new complaints.had a good day yesterday  A 12 point review of systems has been performed and if not noted above is otherwise negative.   Objective: Vital Signs: Blood pressure 121/49, pulse 61, temperature 97.9 F (36.6 C), temperature source Oral, resp. rate 17, height 5' (1.524 m), weight 48.671 kg (107 lb 4.8 oz), SpO2 97.00%. No results found.  Basename 06/07/12 0600  WBC 10.1  HGB 10.4*  HCT 32.4*  PLT 204    Basename 06/07/12 0600  NA 141  K 3.8  CL 105  GLUCOSE 135*  BUN 25*  CREATININE 1.23*  CALCIUM 9.1   CBG (last 3)   Basename 06/07/12 2142 06/07/12 1632 06/07/12 1138  GLUCAP 137* 112* 167*    Wt Readings from Last 3 Encounters:  06/07/12 48.671 kg (107 lb 4.8 oz)  05/29/12 47.5 kg (104 lb 11.5 oz)  05/29/12 47.5 kg (104 lb 11.5 oz)    Physical Exam:  Constitutional: She is oriented to person, place, and time. NAD  HENT: oral mucosa pink and moist  Head: Normocephalic.  Eyes:  Pupils round and reactive to light  Neck: Normal range of motion. Neck supple. No thyromegaly present.  Cardiovascular: Normal rate and regular rhythm. No M,R,G  Pulmonary/Chest: Effort normal and breath sounds normal. No respiratory distress. No wheezes, rales or rhonchi  Abdominal: Soft. Bowel sounds are normal. She exhibits no distension.  Musculoskeletal: She exhibits no edema.  Left hip appropriately tender. Dressed with minimal serosanginous drainage from wounds Neurological: She is alert and oriented to person, place, and time.  Follows full commands. fxnl UE strength. No sensory deficits except for mild LT and PP loss in the feet. Right LE 3/5 prox to 4+/5 distally/ LLE is 1-2 proximally but 4/5 distally at ankle.  Skin:     Psychiatric: She has a normal mood and affect. Her behavior is normal. Judgment and thought content normal    Assessment/Plan: 1. Functional deficits secondary to left IT hip fx s/p IMN which require 3+ hours per day of  interdisciplinary therapy in a comprehensive inpatient rehab setting. Physiatrist is providing close team supervision and 24 hour management of active medical problems listed below. Physiatrist and rehab team continue to assess barriers to discharge/monitor patient progress toward functional and medical goals. FIM: FIM - Bathing Bathing Steps Patient Completed: Chest;Right Arm;Left Arm;Abdomen;Front perineal area;Buttocks;Right upper leg;Left upper leg;Right lower leg (including foot);Left lower leg (including foot) (LE's with LH sponge) Bathing: 5: Supervision: Safety issues/verbal cues  FIM - Upper Body Dressing/Undressing Upper body dressing/undressing steps patient completed: Thread/unthread right sleeve of pullover shirt/dresss;Thread/unthread left sleeve of pullover shirt/dress;Put head through opening of pull over shirt/dress;Pull shirt over trunk Upper body dressing/undressing: 5: Supervision: Safety issues/verbal cues FIM - Lower Body Dressing/Undressing Lower body dressing/undressing steps patient completed: Thread/unthread right underwear leg;Thread/unthread left underwear leg;Pull underwear up/down;Thread/unthread right pants leg;Thread/unthread left pants leg;Pull pants up/down;Don/Doff right shoe;Don/Doff left shoe Lower body dressing/undressing: 4: Min-Patient completed 75 plus % of tasks (max A to donn TED hose)  FIM - Toileting Toileting steps completed by patient: Adjust clothing prior to toileting;Performs perineal hygiene;Adjust clothing after toileting Toileting Assistive Devices: Grab bar or rail for support Toileting: 5: Supervision: Safety issues/verbal cues  FIM - Diplomatic Services operational officer Devices: Art gallery manager Transfers: 5-To toilet/BSC: Supervision (verbal cues/safety issues);5-From toilet/BSC: Supervision (verbal cues/safety issues)  FIM - Banker Devices: Walker;Arm rests Bed/Chair Transfer: 5: Supine >  Sit: Supervision (verbal cues/safety issues);5: Sit > Supine: Supervision (  verbal cues/safety issues);5: Bed > Chair or W/C: Supervision (verbal cues/safety issues);5: Chair or W/C > Bed: Supervision (verbal cues/safety issues)  FIM - Locomotion: Wheelchair Locomotion: Wheelchair: 5: Travels 150 ft or more: maneuvers on rugs and over door sills with supervision, cueing or coaxing FIM - Locomotion: Ambulation Locomotion: Ambulation Assistive Devices: Designer, industrial/product Ambulation/Gait Assistance: 5: Supervision Locomotion: Ambulation: 5: Travels 150 ft or more with supervision/safety issues  Comprehension Comprehension Mode: Auditory Comprehension: 6-Follows complex conversation/direction: With extra time/assistive device  Expression Expression Mode: Verbal Expression: 6-Expresses complex ideas: With extra time/assistive device  Social Interaction Social Interaction: 6-Interacts appropriately with others with medication or extra time (anti-anxiety, antidepressant).  Problem Solving Problem Solving: 6-Solves complex problems: With extra time  Memory Memory: 6-More than reasonable amt of time  Medical Problem List and Plan:  1. Left intertrochanteric hip fracture status post intramedullary nail 05/27/2012  2. DVT Prophylaxis/Anticoagulation: Coumadin for DVT prophylaxis per pharmacy protocol 3. Pain Management: MSIR 7.5 mg every 6 hours as needed pain and Robaxin as needed. Monitor with increased mobility  4. Mood: Paxil 10 mg daily.well compensated at present. 5. Neuropsych: This patient is capable of making decisions on his/her own behalf.  6. Postoperative anemia. Transfused 2 units of packed red blood cells 05/29/2012. Recheck 10.4 today 7. hypertension. Norvasc 5 mg daily. Monitor with increased mobility  8. History of asthma.Dulera 2 puffs twice a day, Singulair 10 mg daily. Patient with no complaints of shortness of breath at present. Tolerating physical activity with therapy  fairly well. 9. Hypothyroidism. Synthroid  10. Diabetes mellitus. Latest hemoglobin A1c of 6.5. Continue sliding scale insulin. Check blood sugars a.c. and at bedtime. Patient on Glucophage 1000 mg twice a day prior to admission. Sugars under control  11. Chronic renal insufficiency. BUN and Cr near normal. 12. CAD. Continue aspirin therapy. Patient no complaints of chest pain  13. Hyperlipidemia. Lipitor  14. Recurrent UTI. -treated 15. Constipation: moved bowels yesterday  LOS (Days) 8 A FACE TO FACE EVALUATION WAS PERFORMED  SWARTZ,ZACHARY T 06/08/2012 7:24 AM

## 2012-06-08 NOTE — Progress Notes (Signed)
Physical Therapy Session Note  Patient Details  Name: [name] MRN: [id number] Date of Birth: [birth year]  Today's Date: 06/08/2012 Time: 1310-1400 Time Calculation (min): 50 min  Short Term Goals: Week 1:  PT Short Term Goal 1 (Week 1): Pt will demonstrate bed mobiliity with S PT Short Term Goal 2 (Week 1): Pt will perform functional transfers with S PT Short Term Goal 3 (Week 1): Pt will gait x 100' with S PT Short Term Goal 4 (Week 1): Pt will demonstrate dynamic standing balance with S  Skilled Therapeutic Interventions/Progress Updates:    Pt missed first 10 minutes of therapy session due to toileting and finishing lunch. Family brought pt late to group. Pt participated in walking group with focus on dynamic gait through obstacle course to simulate home environment on carpeted surface, sit to stands, and dynamic standing balance activity with group members to toss bean bags. Pt overall S/mod I with tasks. Pt enjoyed group setting and socialization with other patients.   Therapy Documentation Precautions:  Precautions Precautions: Fall Precaution Comments: patient with multiple falls in the past  Restrictions Weight Bearing Restrictions: No LLE Weight Bearing: Weight bearing as tolerated General: Amount of Missed PT Time (min): 10 Minutes Missed Time Reason: Other (comment) (see note) Locomotion : Ambulation Ambulation/Gait Assistance: 5: Supervision   See FIM for current functional status  Therapy/Group: Group Therapy  Tedd Sias 06/08/2012, 3:39 PM

## 2012-06-08 NOTE — Progress Notes (Signed)
Occupational Therapy Session Note  Patient Details  Name: [name] MRN: [id number] Date of Birth: 1923-12-29  Today's Date: 06/08/2012 Time: 1308-6578 Time Calculation (min): 45 min  Short Term Goals: Week 1:  OT Short Term Goal 1 (Week 1): Patient will perform UB/LB bathing at supervision level OT Short Term Goal 2 (Week 1): Patient will perform LB dressing at supervision level OT Short Term Goal 3 (Week 1): Patient will perform toileting and toilet transfer at supervision level OT Short Term Goal 4 (Week 1): Patient will perform grooming tasks standing at sink with supervision      Skilled Therapeutic Interventions/Progress Updates:    Pt seen this session for standing balance activities, functional mobility, and UE strengthening.  Pt worked on standing at table to simulate clearing dishes off the table and cleaning table.  She also worked on standing balance without UE support with arms overhead.  UE AROM exercises with a focus on upperback/posture strength.  Therapy Documentation Precautions:  Precautions Precautions: Fall Precaution Comments: patient with multiple falls in the past  Restrictions Weight Bearing Restrictions: No LLE Weight Bearing: Weight bearing as tolerated    Vital Signs: Oxygen Therapy SpO2: 98 % O2 Device: None (Room air) Pain: Pain Assessment Pain Score: 0-No pain Faces Pain Scale: No hurt ADL:  See FIM for current functional status  Therapy/Group: Individual Therapy  Sharron Simpson 06/08/2012, 12:21 PM

## 2012-06-08 NOTE — Progress Notes (Signed)
ANTICOAGULATION CONSULT NOTE - Follow Up Consult  Pharmacy Consult for coumadin Indication: VTE prophylaxis  Allergies  Allergen Reactions  . Codeine Anaphylaxis and Swelling  . Penicillins Anaphylaxis and Swelling  . Sulfonamide Derivatives Anaphylaxis and Swelling  Patient Measurements: Height: 5' (152.4 cm) Weight: 107 lb 4.8 oz (48.671 kg) IBW/kg (Calculated) : 45.5  Vital Signs: Temp: 97.9 F (36.6 C) (01/23 0535) Temp src: Oral (01/23 0535) BP: 121/49 mmHg (01/23 0535) Pulse Rate: 61  (01/23 0535) Labs:  Basename 06/08/12 0605 06/07/12 0600 06/06/12 0650  HGB -- 10.4* --  HCT -- 32.4* --  PLT -- 204 --  APTT -- -- --  LABPROT 22.8* 20.4* 21.9*  INR 2.11* 1.82* 2.00*  HEPARINUNFRC -- -- --  CREATININE -- 1.23* --  CKTOTAL -- -- --  CKMB -- -- --  TROPONINI -- -- --   Estimated Creatinine Clearance: 23.6 ml/min (by C-G formula based on Cr of 1.23). Medications:  Scheduled:     . amLODipine  5 mg Oral Daily  . aspirin  81 mg Oral Daily  . atorvastatin  40 mg Oral q1800  . insulin aspart  0-9 Units Subcutaneous TID WC  . levothyroxine  112 mcg Oral QAC breakfast  . mometasone-formoterol  2 puff Inhalation BID  . montelukast  10 mg Oral Daily  . PARoxetine  10 mg Oral Daily  . senna-docusate  2 tablet Oral QHS  . [COMPLETED] warfarin  2.5 mg Oral ONCE-1800  . Warfarin - Pharmacist Dosing Inpatient   Does not apply q1800   Assessment: 79 yo female s/p ortho surgery is currently on coumadin.  INR is therapeutic today after increasing dose to 2.5mg . No bleeding reported.   Goal of Therapy:  INR 2-3    Plan:  1) Coumadin 2.5mg  po x1 tonight.  2) INR in am.  Fayne Norrie 06/08/2012,1:57 PM

## 2012-06-08 NOTE — Plan of Care (Signed)
Problem: RH BOWEL ELIMINATION Goal: RH STG MANAGE BOWEL W/MEDICATION W/ASSISTANCE STG Manage Bowel with Medication with Assistance. Mod I  Outcome: Progressing Refused senna tonight 1/22, moving bowels regularly and reported stool was loose

## 2012-06-08 NOTE — Plan of Care (Signed)
Problem: RH BLADDER ELIMINATION Goal: RH STG MANAGE BLADDER WITH ASSISTANCE STG Manage Bladder With Assistance. Mod I  Outcome: Progressing Pt has urinary urgency but remains continent

## 2012-06-08 NOTE — Progress Notes (Signed)
Occupational Therapy Session Notes  Patient Details  Name: [name] MRN: [id number] Date of Birth: Apr 30, 1924  Today's Date: 06/08/2012  Short Term Goals: Week 1:  OT Short Term Goal 1 (Week 1): Patient will perform UB/LB bathing at supervision level OT Short Term Goal 2 (Week 1): Patient will perform LB dressing at supervision level OT Short Term Goal 3 (Week 1): Patient will perform toileting and toilet transfer at supervision level OT Short Term Goal 4 (Week 1): Patient will perform grooming tasks standing at sink with supervision  Skilled Therapeutic Interventions/Progress Updates:   Session #1 [phone] - 60 Minutes Individual Therapy No complaints of pain Patient found supine in bed. Engaged in bed mobility then patient ambulated from edge of bed -> bathroom for toilet transfer and toileting. Patient then ambulated throughout room to gather necessary items for her ADL. Therapist then propelled patient in w/c from room -> ADL apartment where patient performed tub/shower transfer on/off tub transfer bench and ADL retraining at shower level. Focused skilled intervention on functional mobility/ambulation with rolling walker, safety with rolling walker (administered walker bag -> patient), UB/LB bathing at shower level, UB/LB dressing, dynamic standing balance/tolerance/endurance, sit/stands, and overall activity tolerance/endurance. Patient functioning at an overall distant supervision/set-up -> mod I level for BADLs at this time.   Session #2 1430-1500 - 30 Minutes Individual Therapy Patient with  Minimal complaints of pain in left hip, RN made  Patient found seated in w/c upon entering room. Patient engaged in functional mobility in room using rolling walker for toilet transfer, toileting, and grooming task of washing hands at sink. Patient then ambulated from room -> day room for therapeutic activity focusing on dynamic standing balance/tolerance/endurance, safety with rolling  walker, & overall activity tolerance/endurance watering plants. Patient ambulated back to room and left seated in w/c with call bell & phone within reach.   Precautions:  Precautions Precautions: Fall Precaution Comments: patient with multiple falls in the past  Restrictions Weight Bearing Restrictions: No LLE Weight Bearing: Weight bearing as tolerated  See FIM for current functional status  Dynesha Woolen 06/08/2012, 7:57 AM

## 2012-06-08 NOTE — Plan of Care (Signed)
Problem: RH PAIN MANAGEMENT Goal: RH STG PAIN MANAGED AT OR BELOW PT'S PAIN GOAL 3 or less  Outcome: Progressing Tylenol effective for pain management

## 2012-06-09 ENCOUNTER — Inpatient Hospital Stay (HOSPITAL_COMMUNITY): Payer: Medicare Other

## 2012-06-09 ENCOUNTER — Inpatient Hospital Stay (HOSPITAL_COMMUNITY): Payer: Medicare Other | Admitting: Occupational Therapy

## 2012-06-09 ENCOUNTER — Inpatient Hospital Stay (HOSPITAL_COMMUNITY): Payer: Medicare Other | Admitting: Physical Therapy

## 2012-06-09 DIAGNOSIS — R296 Repeated falls: Secondary | ICD-10-CM

## 2012-06-09 DIAGNOSIS — D62 Acute posthemorrhagic anemia: Secondary | ICD-10-CM

## 2012-06-09 DIAGNOSIS — N289 Disorder of kidney and ureter, unspecified: Secondary | ICD-10-CM

## 2012-06-09 DIAGNOSIS — F329 Major depressive disorder, single episode, unspecified: Secondary | ICD-10-CM

## 2012-06-09 DIAGNOSIS — S72143A Displaced intertrochanteric fracture of unspecified femur, initial encounter for closed fracture: Secondary | ICD-10-CM

## 2012-06-09 DIAGNOSIS — J449 Chronic obstructive pulmonary disease, unspecified: Secondary | ICD-10-CM

## 2012-06-09 LAB — GLUCOSE, CAPILLARY
Glucose-Capillary: 145 mg/dL — ABNORMAL HIGH (ref 70–99)
Glucose-Capillary: 81 mg/dL (ref 70–99)

## 2012-06-09 LAB — PROTIME-INR
INR: 2.86 — ABNORMAL HIGH (ref 0.00–1.49)
Prothrombin Time: 28.5 seconds — ABNORMAL HIGH (ref 11.6–15.2)

## 2012-06-09 MED ORDER — LEVOTHYROXINE SODIUM 112 MCG PO TABS: 112.0000 ug | ORAL_TABLET | Freq: Every day | ORAL | Status: AC

## 2012-06-09 MED ORDER — METHOCARBAMOL 500 MG PO TABS: 500.0000 mg | ORAL_TABLET | Freq: Four times a day (QID) | ORAL | Status: DC | PRN

## 2012-06-09 MED ORDER — MONTELUKAST SODIUM 10 MG PO TABS: 10.0000 mg | ORAL_TABLET | Freq: Every day | ORAL | Status: DC

## 2012-06-09 MED ORDER — PAROXETINE HCL 10 MG PO TABS: 10.0000 mg | ORAL_TABLET | ORAL | Status: DC

## 2012-06-09 MED ORDER — ROSUVASTATIN CALCIUM 20 MG PO TABS: 20.0000 mg | ORAL_TABLET | Freq: Every day | ORAL | Status: AC

## 2012-06-09 MED ORDER — MOMETASONE FURO-FORMOTEROL FUM 200-5 MCG/ACT IN AERO: 2.0000 | INHALATION_SPRAY | Freq: Three times a day (TID) | RESPIRATORY_TRACT | Status: DC | PRN

## 2012-06-09 MED ORDER — WARFARIN 0.5 MG HALF TABLET
0.5000 mg | ORAL_TABLET | Freq: Once | ORAL | Status: AC
  Administered 2012-06-09: 0.5 mg via ORAL
  Filled 2012-06-09: qty 1

## 2012-06-09 MED ORDER — MORPHINE SULFATE 15 MG PO TABS: 7.5000 mg | ORAL_TABLET | Freq: Four times a day (QID) | ORAL | Status: DC | PRN

## 2012-06-09 MED ORDER — ASPIRIN 81 MG PO TABS: 81.0000 mg | ORAL_TABLET | Freq: Every day | ORAL | Status: DC

## 2012-06-09 MED ORDER — WARFARIN SODIUM 5 MG PO TABS: 5.0000 mg | ORAL_TABLET | Freq: Every day | ORAL | Status: DC

## 2012-06-09 MED ORDER — AMLODIPINE BESYLATE 5 MG PO TABS: 5.0000 mg | ORAL_TABLET | Freq: Every day | ORAL | Status: DC

## 2012-06-09 MED ORDER — METFORMIN HCL 500 MG PO TABS: 500.0000 mg | ORAL_TABLET | Freq: Two times a day (BID) | ORAL | Status: DC

## 2012-06-09 MED ORDER — BOOST PLUS PO LIQD
237.0000 mL | Freq: Two times a day (BID) | ORAL | Status: DC
  Administered 2012-06-10: 237 mL via ORAL
  Filled 2012-06-09 (×5): qty 237

## 2012-06-09 NOTE — Progress Notes (Signed)
Physical Therapy Discharge Summary  Patient Details  Name: [name] MRN: [id number] Date of Birth: [birth year]  Today's Date: 06/09/2012  Time: 6962-9528 (24 min) Individual therapy; Requested tylenol at end of session for pain (RN notified). Focused on gait with RW on unit for endurance and strengthening and on carpeted surface to simulate home environment mod I. Pt with need to use bathroom and completed toileting mod I and returned back to bed to rest at end of session. Pt states she feels prepared for d/c tomorrow and has no further questions.   Patient has met 8 of 8 long term goals due to improved activity tolerance, improved balance, increased strength, decreased pain and ability to compensate for deficits.  Patient to discharge at an ambulatory level Modified Independent using RW.  Patient's daughter is independent to provide the necessary supervision assistance at discharge.  Reasons goals not met: n/a  Recommendation:  Patient will benefit from ongoing skilled PT services in home health setting to continue to advance safe functional mobility, address ongoing impairments in endurance, functional strength, balance, gait, and minimize fall risk.  Equipment: No equipment provided  Reasons for discharge: treatment goals met and discharge from hospital  Patient/family agrees with progress made and goals achieved: Yes  PT Discharge Precautions/Restrictions Precautions Precautions: Fall Precaution Comments: patient with multiple falls in the past  Restrictions Weight Bearing Restrictions: Yes LLE Weight Bearing: Weight bearing as tolerated  Vision/Perception  Vision - History Baseline Vision: No visual deficits Patient Visual Report: No change from baseline Perception Perception: Within Functional Limits Praxis Praxis: Intact  Cognition Overall Cognitive Status: Appears within functional limits for tasks assessed Arousal/Alertness: Awake/alert Orientation Level:  Oriented X4 Memory: Appears intact Awareness: Appears intact Problem Solving: Appears intact Safety/Judgment: Appears intact Sensation Sensation Light Touch: Appears Intact Proprioception: Appears Intact Additional Comments: bilateral UEs appear intact, same as admission Coordination Gross Motor Movements are Fluid and Coordinated: Yes Fine Motor Movements are Fluid and Coordinated: Yes Motor  Motor Motor: Within Functional Limits    Locomotion  Ambulation Ambulation/Gait Assistance: 6: Modified independent (Device/Increase time)  Trunk/Postural Assessment  Cervical Assessment Cervical Assessment: Within Functional Limits Thoracic Assessment Thoracic Assessment: Within Functional Limits Lumbar Assessment Lumbar Assessment: Within Functional Limits Postural Control Postural Control: Within Functional Limits  Balance Balance Balance Assessed: Yes Static Sitting Balance Static Sitting - Level of Assistance: 7: Independent Dynamic Sitting Balance Dynamic Sitting - Level of Assistance: 6: Modified independent (Device/Increase time) Static Standing Balance Static Standing - Level of Assistance: 6: Modified independent (Device/Increase time) Dynamic Standing Balance Dynamic Standing - Level of Assistance: 6: Modified independent (Device/Increase time) Extremity Assessment  RUE Assessment RUE Assessment: Within Functional Limits LUE Assessment LUE Assessment: Within Functional Limits RLE Assessment RLE Assessment: Within Functional Limits LLE Assessment LLE Assessment: Exceptions to [facility] (hip 3/5, 4/5 knee and ankle)  See FIM for current functional status  [name] [facility] 06/09/2012, 9:11 AM

## 2012-06-09 NOTE — Discharge Summary (Signed)
  Discharge summary job [phone]

## 2012-06-09 NOTE — Progress Notes (Signed)
ANTICOAGULATION CONSULT NOTE - Follow Up Consult  Pharmacy Consult for coumadin Indication: VTE prophylaxis  Allergies  Allergen Reactions  . Codeine Anaphylaxis and Swelling  . Penicillins Anaphylaxis and Swelling  . Sulfonamide Derivatives Anaphylaxis and Swelling  Patient Measurements: Height: 5' (152.4 cm) Weight: 107 lb 4.8 oz (48.671 kg) IBW/kg (Calculated) : 45.5  Vital Signs: Temp: 98 F (36.7 C) (01/24 0618) Temp src: Oral (01/24 0618) BP: 138/56 mmHg (01/24 0618) Pulse Rate: 65  (01/24 0618) Labs:  Alvira Philips 06/09/12 0620 06/08/12 0605 06/07/12 0600  HGB -- -- 10.4*  HCT -- -- 32.4*  PLT -- -- 204  APTT -- -- --  LABPROT 28.5* 22.8* 20.4*  INR 2.86* 2.11* 1.82*  HEPARINUNFRC -- -- --  CREATININE -- -- 1.23*  CKTOTAL -- -- --  CKMB -- -- --  TROPONINI -- -- --   Estimated Creatinine Clearance: 23.6 ml/min (by C-G formula based on Cr of 1.23). Medications:  Scheduled:     . amLODipine  5 mg Oral Daily  . aspirin  81 mg Oral Daily  . atorvastatin  40 mg Oral q1800  . insulin aspart  0-9 Units Subcutaneous TID WC  . levothyroxine  112 mcg Oral QAC breakfast  . metFORMIN  500 mg Oral BID WC  . mometasone-formoterol  2 puff Inhalation BID  . montelukast  10 mg Oral Daily  . PARoxetine  10 mg Oral Daily  . [COMPLETED] warfarin  2.5 mg Oral ONCE-1800  . Warfarin - Pharmacist Dosing Inpatient   Does not apply q1800  . [DISCONTINUED] senna-docusate  2 tablet Oral QHS   Assessment: 79 yo female s/p ortho surgery is currently on coumadin.  INR is therapeutic but increased from 2.11 to 2.86. No bleeding reported.   Goal of Therapy:  INR 2-3    Plan:  1) Coumadin 0.5 mg po x1 tonight.  2) INR in am.  Bayard Hugger, PharmD, BCPS  Clinical Pharmacist  Pager: [phone]  06/09/2012,1:17 PM

## 2012-06-09 NOTE — Progress Notes (Signed)
Social Work  Discharge Note  The overall goal for the admission was met for:   Discharge location: Yes - to return to her own home with daughter staying the initial few days  Length of Stay: Yes - 10 days (w/ discharge 1/25)  Discharge activity level: Yes - modified independent  Home/community participation: Yes  Services provided included: MD, RD, PT, OT, RN, TR, Pharmacy and SW  Financial Services: Medicare and Medicaid  Follow-up services arranged: Home Health: PT, RN, CNA via [facility]  Comments (or additional information):  Patient/Family verbalized understanding of follow-up arrangements: Yes  Individual responsible for coordination of the follow-up plan: patient  Confirmed correct DME delivered: NA  [name]

## 2012-06-09 NOTE — Progress Notes (Signed)
Physical Therapy Note  Patient Details  Name: [name] MRN: [id number] Date of Birth: Jul 11, 1923 Today's Date: 06/09/2012  Time: 1100-1140 40 minutes  No c/o pain.  Treatment focused on pt reviewing HEP for supine, seated and standing therex for L LE.  Pt able to complete 3 x 10 of all exercises with handout and verbal cues.  Pt with c/o upset stomach at end of session, back to room to restroom, nursing made aware.  Pt mod I with all mobility, gait and transfers during session.  Individual therapy   Arbutus Nelligan 06/09/2012, 12:21 PM

## 2012-06-09 NOTE — Progress Notes (Signed)
Nutrition Brief Note  Patient identified on the Malnutrition Screening Tool (MST) Report  Body mass index is 20.96 kg/(m^2). Patient meets criteria for WNL based on current BMI.   Current diet order is CHO MOD, patient is consuming approximately 40-75% of meals at this time. Labs and medications reviewed.   Patient reports decreased appetite and not liking seasonings on food.  Weight is trending up to 107 lbs and again close to usual body weight of 108 lbs.  Patient reports following a no concentrated sweets diet prior to admit with Boost once to twice daily.  Will add today.  Noted d/c for tomorrow.  Encouraged continued increase in intake.  If nutrition issues arise, please consult RD.   Oran Rein, RD, LDN Clinical Inpatient Dietitian Pager:  [phone] Weekend and after hours pager:  [phone]

## 2012-06-09 NOTE — Discharge Summary (Signed)
NAMESEANNE, [name] NO.:  [id number]  MEDICAL RECORD NO.:  [id number]  LOCATION:  4039                         FACILITY:  [facility]  PHYSICIAN:  Mariam Dollar, P.A.  DATE OF BIRTH:  [birth year]  DATE OF ADMISSION:  05/31/2012 DATE OF DISCHARGE:  06/10/2012                              DISCHARGE SUMMARY   DISCHARGE DIAGNOSES:  Left intertrochanteric hip fracture with intramedullary nailing on May 27, 2012, Coumadin for deep vein thrombosis prophylaxis, pain management, depression, postoperative anemia, hypertension, history of asthma, hypothyroidism, diabetes mellitus with peripheral neuropathy, chronic renal insufficiency, coronary artery disease, hyperlipidemia.  This is an 79 year old right-handed female admitted with history of multiple falls, most recently in October 2012, resulting in knee fracture.  She was admitted for short time, was skilled nursing facility.  Admitted on May 26, 2012 after a fall while the patient was at [employer].  She stood up, started a walk, when she became dizzy. She denied any loss of consciousness.  X-rays and imaging revealed left intertrochanteric hip fracture.  Underwent intramedullary nail on May 27, 2012 per Dr. Dion Saucier.  Advised weightbearing as tolerated. Postoperative pain management.  Acute blood loss anemia, 7.2, transfused 2 units of packed red blood cells.  Placed on Coumadin for deep vein thrombosis prophylaxis, as well as subcutaneous Lovenox to INR greater than 2.00.  Physical and occupational therapy ongoing.  The patient was admitted for comprehensive rehab program.  PAST MEDICAL HISTORY:  See discharge diagnoses.  SOCIAL HISTORY:  Lives alone, 1 level home.  Functional history prior to admission; she was able to navigate stairs, driving short distances. She is retired.  Functional status upon admission to rehab services was moderate assist for sit to stand, ambulation not tested.  PHYSICAL  EXAMINATION:  VITAL SIGNS:  Blood pressure 135/58, pulse 74, temperature 96, respirations 16.  GENERAL:  This was an alert female, oriented x3.  Pupils round and reactive to light.  LUNGS:  Clear to auscultation.  CARDIAC:  Regular rate and rhythm.  ABDOMEN:  Soft, nontender.  Good bowel sounds.  Left hip incision was dressed.  REHABILITATION HOSPITAL COURSE:  The patient was admitted to inpatient rehab services with therapies initiated on a 3-hour daily basis consisting of physical therapy, occupational therapy, and rehabilitation nursing.  The following issues were addressed during the patient's rehabilitation stay.  Pertaining to the patient's left intertrochanteric hip fracture, she had undergone intramedullary nail on May 27, 2012 per Dr. Dion Saucier.  Neurovascular sensation intact.  She is weightbearing as tolerated.  She remained on Coumadin for DVT prophylaxis per protocol with latest INR of 2.11.  She would complete Coumadin protocol at time of discharge.  Pain management with the use of MSIR as needed as well as Robaxin with good results.  She remained on Paxil for history of depression with emotional support provided.  She was quite motivated for her therapy.  Postoperative anemia.  She had been transfused, latest hemoglobin 10.6.  Blood pressure is well controlled on Norvasc.  She did have a history of diabetes mellitus.  Her Glucophage was resumed at low dose and monitored with blood sugars within acceptable limits.  She did have a  history of chronic renal insufficiency with baseline creatinine of 1.4.  There is history of coronary artery disease, no chest pain or shortness of breath.  Maintained on aspirin therapy.  She would continue Lipitor for history of hyperlipidemia.  She did have some modest constipation which was resolved with laxative assistance.  The patient received weekly collaborative interdisciplinary team conferences to discuss estimated length of stay,  family teaching, and any barriers to discharge.  She was continent of bowel and bladder, supervision minimal assist overall for activities of daily living, supervision mobility. She was discharged to home with intermittent assistance of family with full family teaching completed and outpatient therapies as advised.  DISCHARGE MEDICATIONS:  At the time of dictation included Robaxin 500 mg every 6 hours as needed for muscle spasms, MSIR 7.5 mg p.o. every 6 hours as needed for pain, Norvasc 5 mg p.o. daily, aspirin 81 mg p.o. daily, Lipitor 40 mg p.o. daily, Synthroid 112 mcg p.o. daily, Glucophage 500 mg p.o. b.i.d., Dulera 2 puffs twice daily, Singulair 10 mg p.o. daily, Paxil 10 mg daily, Senokot-S 2 tablets 2 at bedtime, Coumadin 2.5 mg daily to be completed on June 27, 2012 for DVT prophylaxis.  DIET:  Diabetic diet.  SPECIAL INSTRUCTIONS:  The patient should follow up Dr. Dion Saucier, Orthopedic Services in 2 weeks, call for appointment.  Dr. Faith Rogue at the outpatient rehab service office on April 27, 2013.  Dr. Danise Edge of [place].  She has special instructions weightbearing as tolerated.  Plan is for [facility] to complete Coumadin protocol which was to be completed 06/27/2012     Mariam Dollar, P.A.     DA/MEDQ  D:  06/09/2012  T:  06/09/2012  Job:  [id number]  cc:   Ranelle Oyster, M.D. Eulas Post, MD Danise Edge, MD

## 2012-06-09 NOTE — Progress Notes (Signed)
Occupational Therapy Session Note & Discharge Summary  Patient Details  Name: [name] MRN: [id number] Date of Birth: [birth year]  SESSION NOTE Today's Date: 06/09/2012 Time: 0730-0825 Time Calculation (min): 55 min Individual Therapy No complaints of pain Patient found seated in w/c upon entering room. Patient with complaints of nausea and stated she has had three BM's since 4 am, NT made aware. Patient started ambulation throughout room to gather necessary items for her ADL and started feeling nauseated, patient sat in w/c and therapist gave patient some ginger ale. Patient then ambulated into bathroom for toilet transfer, toileting, and shower stall transfer. Focused skilled intervention on functional mobility, ADL transfers, overall activity tolerance/endurance, dynamic standing balance/tolerance/endurance, sit/stands, and grooming tasks standing at sink. At end of session left patient seated in w/c with call bell & phone within reach. Patient functioning at an overall modified independent level for BADLs.   -----------------------------------------------------------------------------------------------------  DISCHARGE SUMMARY Patient has met 11 of 12 long term goals due to improved activity tolerance, improved balance, postural control, ability to compensate for deficits, functional use of  LEFT lower extremity, improved attention, improved awareness and improved coordination.  Patient to discharge at overall Modified Independent level.      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 Currently, patient's daughter has been completing her laundry. Discussed laundry and safety when completing laundry at home with patient.   Recommendation: No additional occupational therapy recommended at this time.  Equipment: No equipment provided at this time, patient states she currently has a BSC and tub transfer bench.   Reasons for  discharge: treatment goals met and discharge from hospital  Patient/family agrees with progress made and goals achieved: Yes  Precautions/Restrictions  Precautions Precautions: Fall Precaution Comments: patient with multiple falls in the past  Restrictions Weight Bearing Restrictions: Yes LLE Weight Bearing: Weight bearing as tolerated  Pain Pain Assessment Pain Assessment: No/denies pain Pain Score: 0-No pain Faces Pain Scale: No hurt Pain Type: Surgical pain Pain Location: Hip Pain Descriptors: Aching Pain Intervention(s): Medication (See eMAR)  ADL - See FIM  Vision/Perception  Vision - History Baseline Vision: No visual deficits Patient Visual Report: No change from baseline Perception Perception: Within Functional Limits Praxis Praxis: Intact   Cognition Overall Cognitive Status: Appears within functional limits for tasks assessed Arousal/Alertness: Awake/alert Orientation Level: Oriented X4 Memory: Appears intact Awareness: Appears intact Problem Solving: Appears intact Safety/Judgment: Appears intact  Sensation Sensation Light Touch: Appears Intact Proprioception: Appears Intact Additional Comments: bilateral UEs appear intact, same as admission Coordination Gross Motor Movements are Fluid and Coordinated: Yes Fine Motor Movements are Fluid and Coordinated: Yes  Motor  Motor Motor: Within Functional Limits  Trunk/Postural Assessment  Cervical Assessment Cervical Assessment: Within Functional Limits Thoracic Assessment Thoracic Assessment: Within Functional Limits Lumbar Assessment Lumbar Assessment: Within Functional Limits Postural Control Postural Control: Within Functional Limits   Balance Balance Balance Assessed: Yes Static Sitting Balance Static Sitting - Level of Assistance: 7: Independent Dynamic Sitting Balance Dynamic Sitting - Level of Assistance: 6: Modified independent (Device/Increase time) Static Standing Balance Static  Standing - Level of Assistance: 6: Modified independent (Device/Increase time) Dynamic Standing Balance Dynamic Standing - Level of Assistance: 6: Modified independent (Device/Increase time)  Extremity/Trunk Assessment RUE Assessment RUE Assessment: Within Functional Limits LUE Assessment LUE Assessment: Within Functional Limits  See FIM for current functional status  Lemon Whitacre 06/09/2012, 9:24 AM

## 2012-06-09 NOTE — Progress Notes (Signed)
Physical Therapy Session Note  Patient Details  Name: [name] MRN: [id number] Date of Birth: 08-18-1923  Today's Date: 06/09/2012 Time: 4098-1191 Time Calculation (min): 55 min  Short Term Goals: Week 1:  PT Short Term Goal 1 (Week 1): Pt will demonstrate bed mobiliity with S PT Short Term Goal 2 (Week 1): Pt will perform functional transfers with S PT Short Term Goal 3 (Week 1): Pt will gait x 100' with S PT Short Term Goal 4 (Week 1): Pt will demonstrate dynamic standing balance with S  Skilled Therapeutic Interventions/Progress Updates:    Pt states she isn't feeling well today and has been to bathroom multiple times already this AM and needed to go again. Pt made mod I in room with use of RW. Finished ADL tasks at sink in standing mod I. Discussed d/c planning (going back to her house instead of daughter's) and follow up therapy. Gait on unit mod I > 150' using RW for overall endurance and strengthening. Reviewed stairs for home mobility as well as step up curb with RW; overall S for safety. Simulated car transfer using RW from sedan height and reviewed bed mobility also. Plan to review HEP in later therapy session; pt more confident with going home (mobility wise), just concerned with not feeling well today.  Therapy Documentation Precautions:  Precautions Precautions: Fall Precaution Comments: patient with multiple falls in the past  Restrictions Weight Bearing Restrictions: Yes LLE Weight Bearing: Weight bearing as tolerated  Pain: Pain Assessment Pain Assessment: No/denies pain Pain Score: 0-No pain  See FIM for current functional status  Therapy/Group: Individual Therapy  [name] [facility] 06/09/2012, 9:29 AM

## 2012-06-09 NOTE — Progress Notes (Signed)
Subjective/Complaints: Feels that stomach is upset. Has had 2 semi loose stools. She and son are concerned that she's not ready to go home tomorrow.  A 12 point review of systems has been performed and if not noted above is otherwise negative.   Objective: Vital Signs: Blood pressure 138/56, pulse 65, temperature 98 F (36.7 C), temperature source Oral, resp. rate 18, height 5' (1.524 m), weight 48.671 kg (107 lb 4.8 oz), SpO2 99.00%. No results found.  Basename 06/07/12 0600  WBC 10.1  HGB 10.4*  HCT 32.4*  PLT 204    Basename 06/07/12 0600  NA 141  K 3.8  CL 105  GLUCOSE 135*  BUN 25*  CREATININE 1.23*  CALCIUM 9.1   CBG (last 3)   Basename 06/08/12 2207 06/08/12 1649 06/08/12 1136  GLUCAP 197* 133* 123*    Wt Readings from Last 3 Encounters:  06/07/12 48.671 kg (107 lb 4.8 oz)  05/29/12 47.5 kg (104 lb 11.5 oz)  05/29/12 47.5 kg (104 lb 11.5 oz)    Physical Exam:  Constitutional: She is oriented to person, place, and time. NAD  HENT: oral mucosa pink and moist  Head: Normocephalic.  Eyes:  Pupils round and reactive to light  Neck: Normal range of motion. Neck supple. No thyromegaly present.  Cardiovascular: Normal rate and regular rhythm. No M,R,G  Pulmonary/Chest: Effort normal and breath sounds normal. No respiratory distress. No wheezes, rales or rhonchi  Abdominal: Soft. Bowel sounds are normal. She exhibits no distension.  Musculoskeletal: She exhibits no edema.  Left hip appropriately tender. Dressed with minimal serosanginous drainage from wounds Neurological: She is alert and oriented to person, place, and time.  Follows full commands. fxnl UE strength. No sensory deficits except for mild LT and PP loss in the feet. Right LE 3+/5 prox to 4+/5 distally/ LLE is 2 to 2+ proximally but 4+/5 distally at ankle.  Skin:     Psychiatric: She has a normal mood and affect. Her behavior is normal. Judgment and thought content normal    Assessment/Plan: 1.  Functional deficits secondary to left IT hip fx s/p IMN which require 3+ hours per day of interdisciplinary therapy in a comprehensive inpatient rehab setting. Physiatrist is providing close team supervision and 24 hour management of active medical problems listed below. Physiatrist and rehab team continue to assess barriers to discharge/monitor patient progress toward functional and medical goals.  I will speak to the son this morning. I see no reason as to why she shouldn't be able to leave tomorrow.  FIM: FIM - Bathing Bathing Steps Patient Completed: Chest;Right Arm;Left Arm;Abdomen;Front perineal area;Buttocks;Right upper leg;Left upper leg;Right lower leg (including foot);Left lower leg (including foot) Bathing: 5: Set-up assist to: Obtain items  FIM - Upper Body Dressing/Undressing Upper body dressing/undressing steps patient completed: Thread/unthread right sleeve of pullover shirt/dresss;Thread/unthread left sleeve of pullover shirt/dress;Put head through opening of pull over shirt/dress;Pull shirt over trunk Upper body dressing/undressing: 5: Set-up assist to: Obtain clothing/put away FIM - Lower Body Dressing/Undressing Lower body dressing/undressing steps patient completed: Thread/unthread right underwear leg;Thread/unthread left underwear leg;Pull underwear up/down;Thread/unthread right pants leg;Thread/unthread left pants leg;Pull pants up/down;Don/Doff right shoe;Don/Doff left shoe;Fasten/unfasten right shoe;Fasten/unfasten left shoe Lower body dressing/undressing: 5: Set-up assist to: Obtain clothing  FIM - Toileting Toileting steps completed by patient: Adjust clothing prior to toileting;Performs perineal hygiene;Adjust clothing after toileting Toileting Assistive Devices: Grab bar or rail for support Toileting: 6: Assistive device: No helper  FIM - Diplomatic Services operational officer Devices: Elevated toilet  seat;Grab bars;Walker Toilet Transfers: 6-Assistive  device: No helper  FIM - Banker Devices: Therapist, occupational: 6: Assistive device: no helper  FIM - Locomotion: Wheelchair Locomotion: Wheelchair: 1: Total Assistance/staff pushes wheelchair (Pt<25%) FIM - Locomotion: Ambulation Locomotion: Ambulation Assistive Devices: Designer, industrial/product Ambulation/Gait Assistance: 5: Supervision Locomotion: Ambulation: 5: Travels 150 ft or more with supervision/safety issues  Comprehension Comprehension Mode: Auditory Comprehension: 6-Follows complex conversation/direction: With extra time/assistive device  Expression Expression Mode: Verbal Expression: 6-Expresses complex ideas: With extra time/assistive device  Social Interaction Social Interaction: 6-Interacts appropriately with others with medication or extra time (anti-anxiety, antidepressant).  Problem Solving Problem Solving: 6-Solves complex problems: With extra time  Memory Memory: 6-More than reasonable amt of time  Medical Problem List and Plan:  1. Left intertrochanteric hip fracture status post intramedullary nail 05/27/2012  2. DVT Prophylaxis/Anticoagulation: Coumadin for DVT prophylaxis per pharmacy protocol 3. Pain Management: MSIR 7.5 mg every 6 hours as needed pain and Robaxin as needed. Monitor with increased mobility  4. Mood: Paxil 10 mg daily.well compensated at present. 5. Neuropsych: This patient is capable of making decisions on his/her own behalf.  6. Postoperative anemia. Transfused 2 units of packed red blood cells 05/29/2012. Recheck 10.4 today 7. hypertension. Norvasc 5 mg daily. Monitor with increased mobility  8. History of asthma.Dulera 2 puffs twice a day, Singulair 10 mg daily. Patient with no complaints of shortness of breath at present. Tolerating physical activity with therapy fairly well. 9. Hypothyroidism. Synthroid  10. Diabetes mellitus. Latest hemoglobin A1c of 6.5. Continue sliding scale insulin. Check  blood sugars a.c. and at bedtime. Patient on Glucophage 1000 mg twice a day prior to admission. Sugars under good control  11. Chronic renal insufficiency. BUN and Cr near normal. 12. CAD. Continue aspirin therapy. Patient no complaints of chest pain  13. Hyperlipidemia. Lipitor  14. Recurrent UTI. -treated 15. Constipation: now with loose stool. Suspect this is due to senna s. Stopped this. Monitor today.   LOS (Days) 9 A FACE TO FACE EVALUATION WAS PERFORMED  SWARTZ,ZACHARY T 06/09/2012 7:16 AM

## 2012-06-10 LAB — GLUCOSE, CAPILLARY: Glucose-Capillary: 123 mg/dL — ABNORMAL HIGH (ref 70–99)

## 2012-06-10 LAB — PROTIME-INR: INR: 3.07 — ABNORMAL HIGH (ref 0.00–1.49)

## 2012-06-10 NOTE — Progress Notes (Signed)
Subjective/Complaints: No new complaints. Ready for dc  A 12 point review of systems has been performed and if not noted above is otherwise negative.   Objective: Vital Signs: Blood pressure 133/66, pulse 67, temperature 98.3 F (36.8 C), temperature source Oral, resp. rate 17, height 5' (1.524 m), weight 48.671 kg (107 lb 4.8 oz), SpO2 100.00%. No results found. No results found for this basename: WBC:2,HGB:2,HCT:2,PLT:2 in the last 72 hours No results found for this basename: NA:2,K:2,CL:2,CO:2,GLUCOSE:2,BUN:2,CREATININE:2,CALCIUM:2 in the last 72 hours CBG (last 3)   Basename 06/09/12 2055 06/09/12 1624 06/09/12 1134  GLUCAP 116* 134* 81    Wt Readings from Last 3 Encounters:  06/07/12 48.671 kg (107 lb 4.8 oz)  05/29/12 47.5 kg (104 lb 11.5 oz)  05/29/12 47.5 kg (104 lb 11.5 oz)    Physical Exam:  Constitutional: She is oriented to person, place, and time. NAD  HENT: oral mucosa pink and moist  Head: Normocephalic.  Eyes:  Pupils round and reactive to light  Neck: Normal range of motion. Neck supple. No thyromegaly present.  Cardiovascular: Normal rate and regular rhythm. No M,R,G  Pulmonary/Chest: Effort normal and breath sounds normal. No respiratory distress. No wheezes, rales or rhonchi  Abdominal: Soft. Bowel sounds are normal. She exhibits no distension.  Musculoskeletal: She exhibits no edema.  Left hip appropriately tender. Dressed with minimal serosanginous drainage from wounds Neurological: She is alert and oriented to person, place, and time.  Follows full commands. fxnl UE strength. No sensory deficits except for mild LT and PP loss in the feet. Right LE 3+/5 prox to 4+/5 distally/ LLE is 2 to 2+ proximally but 4+/5 distally at ankle.  Skin:     Psychiatric: She has a normal mood and affect. Her behavior is normal. Judgment and thought content normal    Assessment/Plan: 1. Functional deficits secondary to left IT hip fx s/p IMN which require 3+ hours per  day of interdisciplinary therapy in a comprehensive inpatient rehab setting. Physiatrist is providing close team supervision and 24 hour management of active medical problems listed below. Physiatrist and rehab team continue to assess barriers to discharge/monitor patient progress toward functional and medical goals.  Home today. Successful family ed. HH fu arranged. Pt satisfied  FIM: FIM - Bathing Bathing Steps Patient Completed: Chest;Right Arm;Left Arm;Abdomen;Front perineal area;Buttocks;Right upper leg;Left upper leg;Right lower leg (including foot);Left lower leg (including foot) Bathing: 6: Assistive device (Comment)  FIM - Upper Body Dressing/Undressing Upper body dressing/undressing steps patient completed: Thread/unthread right sleeve of pullover shirt/dresss;Thread/unthread left sleeve of pullover shirt/dress;Put head through opening of pull over shirt/dress;Pull shirt over trunk Upper body dressing/undressing: 7: Complete Independence: No helper FIM - Lower Body Dressing/Undressing Lower body dressing/undressing steps patient completed: Thread/unthread right underwear leg;Thread/unthread left underwear leg;Pull underwear up/down;Thread/unthread right pants leg;Thread/unthread left pants leg;Pull pants up/down;Don/Doff right shoe;Don/Doff left shoe;Fasten/unfasten right shoe;Fasten/unfasten left shoe Lower body dressing/undressing: 6: Assistive device (Comment)  FIM - Toileting Toileting steps completed by patient: Adjust clothing prior to toileting;Performs perineal hygiene;Adjust clothing after toileting Toileting Assistive Devices: Grab bar or rail for support Toileting: 6: Assistive device: No helper  FIM - Diplomatic Services operational officer Devices: Elevated toilet seat;Grab bars;Walker Toilet Transfers: 6-Assistive device: No helper  FIM - Banker Devices: Therapist, occupational: 6: Assistive device: no helper;6:  Supine > Sit: No assist;6: Sit > Supine: No assist;6: Bed > Chair or W/C: No assist;6: Chair or W/C > Bed: No assist  FIM - Locomotion: Wheelchair Locomotion: Wheelchair:  1: Total Assistance/staff pushes wheelchair (Pt<25%) (gait primary means) FIM - Locomotion: Ambulation Locomotion: Ambulation Assistive Devices: Walker - Rolling Ambulation/Gait Assistance: 6: Modified independent (Device/Increase time) Locomotion: Ambulation: 6: Travels 150 ft or more with assistive device/no helper  Comprehension Comprehension Mode: Auditory Comprehension: 6-Follows complex conversation/direction: With extra time/assistive device  Expression Expression Mode: Verbal Expression: 6-Expresses complex ideas: With extra time/assistive device  Social Interaction Social Interaction: 6-Interacts appropriately with others with medication or extra time (anti-anxiety, antidepressant).  Problem Solving Problem Solving: 6-Solves complex problems: With extra time  Memory Memory: 6-More than reasonable amt of time  Medical Problem List and Plan:  1. Left intertrochanteric hip fracture status post intramedullary nail 05/27/2012  2. DVT Prophylaxis/Anticoagulation: Coumadin for DVT prophylaxis per pharmacy protocol 3. Pain Management: MSIR 7.5 mg every 6 hours as needed pain and Robaxin as needed. Monitor with increased mobility  4. Mood: Paxil 10 mg daily.well compensated at present. 5. Neuropsych: This patient is capable of making decisions on his/her own behalf.  6. Postoperative anemia. Transfused 2 units of packed red blood cells 05/29/2012. Recheck 10.4 today 7. hypertension. Norvasc 5 mg daily. Monitor with increased mobility  8. History of asthma.Dulera 2 puffs twice a day, Singulair 10 mg daily. Patient with no complaints of shortness of breath at present. Tolerating physical activity with therapy fairly well. 9. Hypothyroidism. Synthroid  10. Diabetes mellitus. Latest hemoglobin A1c of 6.5. Continue  sliding scale insulin. Check blood sugars a.c. and at bedtime. Patient on Glucophage 1000 mg twice a day prior to admission. Sugars under good control  11. Chronic renal insufficiency. BUN and Cr near normal. 12. CAD. Continue aspirin therapy. Patient no complaints of chest pain  13. Hyperlipidemia. Lipitor  14. Recurrent UTI. -treated 15. Constipation:resolved. Diarrhea better LOS (Days) 10 A FACE TO FACE EVALUATION WAS PERFORMED  Jazier Mcglamery T 06/10/2012 7:20 AM

## 2012-06-12 ENCOUNTER — Encounter: Payer: Medicare Other | Admitting: Internal Medicine

## 2012-06-14 ENCOUNTER — Ambulatory Visit: Payer: Medicare Other | Admitting: Family Medicine

## 2012-06-15 ENCOUNTER — Other Ambulatory Visit: Payer: Self-pay | Admitting: Family Medicine

## 2012-06-15 ENCOUNTER — Telehealth: Payer: Self-pay | Admitting: *Deleted

## 2012-06-15 ENCOUNTER — Other Ambulatory Visit: Payer: Medicare Other

## 2012-06-15 DIAGNOSIS — Z5181 Encounter for therapeutic drug level monitoring: Secondary | ICD-10-CM

## 2012-06-15 DIAGNOSIS — Z7901 Long term (current) use of anticoagulants: Secondary | ICD-10-CM

## 2012-06-15 DIAGNOSIS — IMO0001 Reserved for inherently not codable concepts without codable children: Secondary | ICD-10-CM

## 2012-06-15 LAB — PROTIME-INR: Prothrombin Time: 32.7 seconds — ABNORMAL HIGH (ref 11.6–15.2)

## 2012-06-15 MED ORDER — CIPROFLOXACIN HCL 250 MG PO TABS: 250.0000 mg | ORAL_TABLET | Freq: Two times a day (BID) | ORAL | Status: AC

## 2012-06-15 NOTE — Progress Notes (Signed)
Labs were drawn by [facility] and dropped off here, I entered orders per the nurse and ask Sols[employer] to fax a copy to [phone] @ [facility]

## 2012-06-15 NOTE — Telephone Encounter (Signed)
Dr. Nancy Nordmann.

## 2012-06-15 NOTE — Telephone Encounter (Signed)
Dr. Milinda Cave, please co-sign.  Thanks.

## 2012-06-15 NOTE — Telephone Encounter (Signed)
Darl Pikes from [facility] is with patient for PT/INR check that was ordered by ortho.  Pt is complaining of hematuria, burning with urination, frequency and nocturia.  Darl Pikes would like to know if OK to come to office to pick up collection cup for urinalysis.  States PT/INR is being handled by pharmacist at [facility].  Today's finger stick is 4.9 and Darl Pikes will do venipuncture to verify and send to [employer] [facility].  PT/INR on Monday was 3.9 and was told to hold.  Last coumadin dose was 5 mg on Monday AM.  None since.   Verbal per Dr. Milinda Cave, OK to treat symptoms.  Cipro 250 1 BID x 3 days.  This will be called to pharmacy.  Darl Pikes will pick up collection container and we will send for culture. Darl Pikes dropped off sample and this was sent to [employer] [facility].

## 2012-06-15 NOTE — Telephone Encounter (Signed)
Noted. Agree.

## 2012-06-15 NOTE — Telephone Encounter (Signed)
thanks

## 2012-06-17 LAB — URINE CULTURE

## 2012-06-19 MED ORDER — NITROFURANTOIN MONOHYD MACRO 100 MG PO CAPS: 100.0000 mg | ORAL_CAPSULE | Freq: Two times a day (BID) | ORAL | Status: DC

## 2012-06-23 DIAGNOSIS — Z5189 Encounter for other specified aftercare: Secondary | ICD-10-CM | POA: Diagnosis not present

## 2012-06-27 ENCOUNTER — Encounter: Payer: Self-pay | Admitting: *Deleted

## 2012-06-27 ENCOUNTER — Other Ambulatory Visit (HOSPITAL_BASED_OUTPATIENT_CLINIC_OR_DEPARTMENT_OTHER): Payer: Medicare Other | Admitting: Lab

## 2012-06-27 ENCOUNTER — Ambulatory Visit: Payer: Medicare Other

## 2012-06-27 ENCOUNTER — Other Ambulatory Visit (INDEPENDENT_AMBULATORY_CARE_PROVIDER_SITE_OTHER): Payer: Medicare Other

## 2012-06-27 DIAGNOSIS — D469 Myelodysplastic syndrome, unspecified: Secondary | ICD-10-CM

## 2012-06-27 DIAGNOSIS — R350 Frequency of micturition: Secondary | ICD-10-CM

## 2012-06-27 LAB — POCT URINALYSIS DIPSTICK
Protein, UA: 100
Spec Grav, UA: 1.025
Urobilinogen, UA: 0.2
pH, UA: 5.5

## 2012-06-27 LAB — CBC WITH DIFFERENTIAL/PLATELET
Basophils Absolute: 0 10*3/uL (ref 0.0–0.1)
EOS%: 4.8 % (ref 0.0–7.0)
HGB: 11 g/dL — ABNORMAL LOW (ref 11.6–15.9)
MCH: 32.1 pg (ref 25.1–34.0)
MCV: 96 fL (ref 79.5–101.0)
MONO%: 8.7 % (ref 0.0–14.0)
NEUT%: 80.5 % — ABNORMAL HIGH (ref 38.4–76.8)
RDW: 19.1 % — ABNORMAL HIGH (ref 11.2–14.5)

## 2012-06-27 MED ORDER — CEFDINIR 300 MG PO CAPS: 300.0000 mg | ORAL_CAPSULE | Freq: Two times a day (BID) | ORAL | Status: DC

## 2012-06-27 MED ORDER — DARBEPOETIN ALFA-POLYSORBATE 300 MCG/0.6ML IJ SOLN
300.0000 ug | Freq: Once | INTRAMUSCULAR | Status: DC
  Filled 2012-06-27: qty 0.6

## 2012-06-28 ENCOUNTER — Telehealth: Payer: Self-pay

## 2012-06-28 ENCOUNTER — Encounter: Payer: Medicare Other | Admitting: Physical Medicine & Rehabilitation

## 2012-06-28 NOTE — Telephone Encounter (Signed)
[name] with [facility] called to get orders to continue care.  Verbal ok given to continue care.

## 2012-07-03 ENCOUNTER — Telehealth: Payer: Self-pay | Admitting: *Deleted

## 2012-07-03 ENCOUNTER — Telehealth: Payer: Self-pay | Admitting: Family Medicine

## 2012-07-03 NOTE — Telephone Encounter (Signed)
FYI: I spoke with pt to inform her that her urine culture was negative and pt informed me that she has a message in with urology to call her back.

## 2012-07-03 NOTE — Telephone Encounter (Signed)
Perfect, if urology does not respond she can bring [name] a sample but it has not helped much the last couple of times

## 2012-07-03 NOTE — Telephone Encounter (Signed)
Patient informed and voiced understanding

## 2012-07-03 NOTE — Telephone Encounter (Signed)
Extend visits 1xw3.  Having some urinary issues and medication teaching.  Approval given to extend.

## 2012-07-03 NOTE — Progress Notes (Signed)
Quick Note:  Patient Informed and voiced understanding ______ 

## 2012-07-03 NOTE — Telephone Encounter (Signed)
Is pt supposed to follow up with Urology?

## 2012-07-12 ENCOUNTER — Encounter: Payer: Self-pay | Admitting: Physical Medicine & Rehabilitation

## 2012-07-12 ENCOUNTER — Encounter: Payer: Medicare Other | Attending: Physical Medicine & Rehabilitation | Admitting: Physical Medicine & Rehabilitation

## 2012-07-12 VITALS — BP 114/55 | HR 108 | Resp 14 | Ht 60.0 in | Wt 101.0 lb

## 2012-07-12 DIAGNOSIS — N390 Urinary tract infection, site not specified: Secondary | ICD-10-CM | POA: Insufficient documentation

## 2012-07-12 DIAGNOSIS — X58XXXA Exposure to other specified factors, initial encounter: Secondary | ICD-10-CM | POA: Insufficient documentation

## 2012-07-12 DIAGNOSIS — S72142D Displaced intertrochanteric fracture of left femur, subsequent encounter for closed fracture with routine healing: Secondary | ICD-10-CM

## 2012-07-12 DIAGNOSIS — S72143A Displaced intertrochanteric fracture of unspecified femur, initial encounter for closed fracture: Secondary | ICD-10-CM | POA: Insufficient documentation

## 2012-07-12 DIAGNOSIS — C679 Malignant neoplasm of bladder, unspecified: Secondary | ICD-10-CM

## 2012-07-12 DIAGNOSIS — S72009D Fracture of unspecified part of neck of unspecified femur, subsequent encounter for closed fracture with routine healing: Secondary | ICD-10-CM

## 2012-07-12 NOTE — Progress Notes (Signed)
Subjective:    Patient ID: [name], female    DOB: 1924-03-06, 79 y.o.   MRN: [id number]  HPI  [name] is back regarding her left IT hip fx. HHPT is still working with her with potential dc next week. She is about to transition to a 4 or 3 pronged cane. She is afraid of falling and has some memories of her fall including the sound of the bone breaking which still haunt her to an extent.   Her daughter wants her to start driving again and would actually like for her to drive home from the office today.   She continues to have issues with UTI"s. She is currently on macrobid per urology.  Appetite is slowly improving.  Pain Inventory Average Pain 0 Pain Right Now 0 My pain is n/a  In the last 24 hours, has pain interfered with the following? General activity 5 Relation with others 5 Enjoyment of life 5 What TIME of day is your pain at its worst? night Sleep (in general) Good  Pain is worse with: inactivity Pain improves with: pacing activities Relief from Meds: n/a  Mobility walk with assistance use a walker ability to climb steps?  yes do you drive?  no  Function retired  Neuro/Psych bladder control problems  Prior Studies Any changes since last visit?  no  Physicians involved in your care Any changes since last visit?  no   Family History  Problem Relation Age of Onset  . Heart disease Mother     chf  . Arthritis Mother   . Arthritis Father   . Kidney disease Father   . Hypertension Father   . Cancer Brother     lung  . Arthritis Daughter     MVA with very serious injuries   History   Social History  . Marital Status: Widowed    Spouse Name: N/A    Number of Children: N/A  . Years of Education: N/A   Social History Main Topics  . Smoking status: Never Smoker   . Smokeless tobacco: Never Used  . Alcohol Use: No  . Drug Use: No  . Sexually Active: No   Other Topics Concern  . None   Social History Narrative  . None   Past  Surgical History  Procedure Laterality Date  . Cholecystectomy    . Pcm - medtronic    . Abdominal hysterectomy    . Shoulder open rotator cuff repair    . Foot surgery      bunionectomy  . Intramedullary (im) nail intertrochanteric  05/27/2012    Procedure: INTRAMEDULLARY (IM) NAIL INTERTROCHANTRIC;  Surgeon: Eulas Post, MD;  Location: [facility] OR;  Service: Orthopedics;  Laterality: Left;   Past Medical History  Diagnosis Date  . CAD (coronary artery disease)     a. 10/2007 PCI Diag w/ 2.25 x 8mm Taxus Atom DES.;  b. 09/2011 Lexi MV: EF 69%, no ischemia/infarct.  Marland Kitchen PVD (peripheral vascular disease)   . Carotid artery stenosis   . HTN (hypertension)   . Hyperlipidemia   . Hypothyroidism   . CKD (chronic kidney disease), stage III   . Diabetes mellitus 07/16/2011  . Allergic state 07/16/2011  . Insomnia 07/16/2011  . Recurrent UTI 07/16/2011  . Asthma     childhood, recurrent at 66  . Myelodysplastic syndrome 04/02/2011  . Sinusitis 07/18/2011  . Anemia 07/18/2011  . Arthritis 07/18/2011  . Cataract extraction status of right eye 08/18/2011  . Anxiety and  depression 08/18/2011  . Bronchitis, acute 12/20/2011  . Headache 03/02/2012  . AV heart block     a. 10/2007 MDT Versa dual chamber PPM.  . Bladder cancer 12/2002  . Closed intertrochanteric fracture of left hip 05/27/2012   BP 114/55  Pulse 108  Resp 14  Ht 5' (1.524 m)  Wt 101 lb (45.813 kg)  BMI 19.73 kg/m2  SpO2 88%     Review of Systems  Genitourinary: Positive for difficulty urinating.  All other systems reviewed and are negative.       Objective:   Physical Exam  General: Alert and oriented x 3, No apparent distress HEENT: Head is normocephalic, atraumatic, PERRLA, EOMI, sclera anicteric, oral mucosa pink and moist, dentition intact, ext ear canals clear,  Neck: Supple without JVD or lymphadenopathy Heart: Reg rate and rhythm. No murmurs rubs or gallops Chest: CTA bilaterally without wheezes, rales, or rhonchi; no  distress Abdomen: Soft, non-tender, non-distended, bowel sounds positive. Extremities: No clubbing, cyanosis, or edema. Pulses are 2+ Skin: Clean and intact without signs of breakdown Neuro: Pt is cognitively appropriate with normal insight, memory, and awareness. Cranial nerves 2-12 are intact. Sensory exam is normal. Reflexes are 2+ in all 4's. Fine motor coordination is intact. No tremors. Motor function is grossly 5/5.  Musculoskeletal: left hip remains slightly tender. She is antalgic still in stance on the left leg. She lacked confidence to shift weight as well. She did better the longer she walked. She utilized her cane for gait. Psych: Pt's affect is appropriate. Pt is cooperative         Assessment & Plan:  1. Left intertrochanteric hip fx  -continue with HH PT  -discussed gaining confidence by using her walker to help with technique and to transition to cane  -she might do well with aquatic based activities to build confidence and strength ([employer])  -utilize ice, heat, tylenol for periodic spasms and pain 2. Bladder cancer, UTI  -encouraged double voids and extra time to make sure she's emptying more completely  -further mgt per urology 3. Follow up with me PRN. 15 minutes of face to face patient care time were spent during this visit. All questions were encouraged and answered.

## 2012-07-12 NOTE — Patient Instructions (Addendum)
PURSUE AQUATIC WALKING AND AEROBIC CLASSES IF YOU WOULD LIKE  CONTINUE TO USE YOUR WALKER UNTIL YOU FEEL CONFIDENT. YOU DON'T HAVE TO BE IN A RUSH TO USE YOUR CANE.    TAKE EXTRA TIME TO EMPTY YOUR BLADDER TO HELP MAKE SURE IT'S EMPTY

## 2012-07-14 ENCOUNTER — Other Ambulatory Visit: Payer: Self-pay | Admitting: Oncology

## 2012-07-14 DIAGNOSIS — D469 Myelodysplastic syndrome, unspecified: Secondary | ICD-10-CM

## 2012-07-15 ENCOUNTER — Other Ambulatory Visit: Payer: Self-pay | Admitting: Family Medicine

## 2012-07-18 ENCOUNTER — Telehealth: Payer: Self-pay | Admitting: *Deleted

## 2012-07-18 ENCOUNTER — Other Ambulatory Visit: Payer: Medicare Other | Admitting: Lab

## 2012-07-18 ENCOUNTER — Ambulatory Visit: Payer: Medicare Other

## 2012-07-18 NOTE — Telephone Encounter (Signed)
Patient called to cancel injection appointment and reschedule due to the weather.  Rescheduled to next week 07/24/12.

## 2012-07-19 ENCOUNTER — Telehealth: Payer: Self-pay

## 2012-07-19 NOTE — Telephone Encounter (Signed)
So I have not seen her since October so at this time I have no idea what others have told her since. She was on Amlodipine at that time. I need to see her to check her bp before we can decide what she should be taking now. Is she still taking Coumadin? If so is anyone following this?

## 2012-07-19 NOTE — Telephone Encounter (Signed)
Pt states she is supposed to be taking Amlodipine and wasn't aware of the Lisinopril? Please advise if pt is supposed to be taking both?

## 2012-07-20 NOTE — Telephone Encounter (Signed)
Patient informed and stated that she believes she is supposed to be taking the Amlodipine and is going to continue taking this. Pt also stated that she is not taking the Coumadin that it made her sick.

## 2012-07-24 ENCOUNTER — Other Ambulatory Visit: Payer: Medicare Other | Admitting: Lab

## 2012-07-24 ENCOUNTER — Ambulatory Visit: Payer: Medicare Other

## 2012-07-25 ENCOUNTER — Telehealth: Payer: Self-pay | Admitting: Oncology

## 2012-07-27 ENCOUNTER — Ambulatory Visit: Payer: Medicare Other

## 2012-07-27 ENCOUNTER — Other Ambulatory Visit (HOSPITAL_BASED_OUTPATIENT_CLINIC_OR_DEPARTMENT_OTHER): Payer: Medicare Other | Admitting: Lab

## 2012-07-27 DIAGNOSIS — D469 Myelodysplastic syndrome, unspecified: Secondary | ICD-10-CM

## 2012-07-27 LAB — CBC WITH DIFFERENTIAL/PLATELET
EOS%: 3.8 % (ref 0.0–7.0)
MCH: 33.5 pg (ref 25.1–34.0)
MCHC: 33 g/dL (ref 31.5–36.0)
MCV: 101.6 fL — ABNORMAL HIGH (ref 79.5–101.0)
MONO%: 6.8 % (ref 0.0–14.0)
RBC: 3.43 10*6/uL — ABNORMAL LOW (ref 3.70–5.45)
RDW: 21 % — ABNORMAL HIGH (ref 11.2–14.5)

## 2012-07-27 MED ORDER — DARBEPOETIN ALFA-POLYSORBATE 300 MCG/0.6ML IJ SOLN
300.0000 ug | Freq: Once | INTRAMUSCULAR | Status: DC
  Filled 2012-07-27: qty 0.6

## 2012-08-08 ENCOUNTER — Other Ambulatory Visit: Payer: Medicare Other | Admitting: Lab

## 2012-08-08 ENCOUNTER — Ambulatory Visit: Payer: Medicare Other

## 2012-08-08 ENCOUNTER — Encounter: Payer: Self-pay | Admitting: Internal Medicine

## 2012-08-10 ENCOUNTER — Other Ambulatory Visit (HOSPITAL_BASED_OUTPATIENT_CLINIC_OR_DEPARTMENT_OTHER): Payer: Medicare Other | Admitting: Lab

## 2012-08-10 ENCOUNTER — Ambulatory Visit: Payer: Medicare Other

## 2012-08-10 DIAGNOSIS — D649 Anemia, unspecified: Secondary | ICD-10-CM

## 2012-08-10 DIAGNOSIS — D469 Myelodysplastic syndrome, unspecified: Secondary | ICD-10-CM

## 2012-08-10 LAB — CBC WITH DIFFERENTIAL/PLATELET
Eosinophils Absolute: 0.5 10*3/uL (ref 0.0–0.5)
MCV: 104.2 fL — ABNORMAL HIGH (ref 79.5–101.0)
MONO%: 5.8 % (ref 0.0–14.0)
NEUT#: 9.4 10*3/uL — ABNORMAL HIGH (ref 1.5–6.5)
RBC: 3.3 10*6/uL — ABNORMAL LOW (ref 3.70–5.45)
RDW: 18.2 % — ABNORMAL HIGH (ref 11.2–14.5)
WBC: 12 10*3/uL — ABNORMAL HIGH (ref 3.9–10.3)
nRBC: 0 % (ref 0–0)

## 2012-08-10 MED ORDER — DARBEPOETIN ALFA-POLYSORBATE 300 MCG/0.6ML IJ SOLN: 300.0000 ug | Freq: Once | INTRAMUSCULAR | Status: DC

## 2012-08-22 ENCOUNTER — Other Ambulatory Visit: Payer: Self-pay | Admitting: Physical Medicine & Rehabilitation

## 2012-08-23 ENCOUNTER — Other Ambulatory Visit: Payer: Self-pay | Admitting: Family Medicine

## 2012-08-23 NOTE — Telephone Encounter (Signed)
Paxil request [Last Rx 01.24.14 #[id number]]/SLS Please advise.

## 2012-08-29 ENCOUNTER — Other Ambulatory Visit (HOSPITAL_BASED_OUTPATIENT_CLINIC_OR_DEPARTMENT_OTHER): Payer: Medicare Other | Admitting: Lab

## 2012-08-29 ENCOUNTER — Ambulatory Visit: Payer: Medicare Other

## 2012-08-29 DIAGNOSIS — D469 Myelodysplastic syndrome, unspecified: Secondary | ICD-10-CM

## 2012-08-29 DIAGNOSIS — D649 Anemia, unspecified: Secondary | ICD-10-CM

## 2012-08-29 LAB — CBC WITH DIFFERENTIAL/PLATELET
BASO%: 0.2 % (ref 0.0–2.0)
Basophils Absolute: 0 10*3/uL (ref 0.0–0.1)
EOS%: 7 % (ref 0.0–7.0)
Eosinophils Absolute: 0.9 10*3/uL — ABNORMAL HIGH (ref 0.0–0.5)
HCT: 33.6 % — ABNORMAL LOW (ref 34.8–46.6)
HGB: 11 g/dL — ABNORMAL LOW (ref 11.6–15.9)
LYMPH%: 13.5 % — ABNORMAL LOW (ref 14.0–49.7)
MCH: 34.2 pg — ABNORMAL HIGH (ref 25.1–34.0)
MCHC: 32.7 g/dL (ref 31.5–36.0)
MCV: 104.3 fL — ABNORMAL HIGH (ref 79.5–101.0)
MONO#: 1.2 10*3/uL — ABNORMAL HIGH (ref 0.1–0.9)
MONO%: 9.1 % (ref 0.0–14.0)
NEUT#: 9.2 10*3/uL — ABNORMAL HIGH (ref 1.5–6.5)
NEUT%: 70.2 % (ref 38.4–76.8)
Platelets: 140 10*3/uL — ABNORMAL LOW (ref 145–400)
RBC: 3.22 10*6/uL — ABNORMAL LOW (ref 3.70–5.45)
RDW: 15.9 % — ABNORMAL HIGH (ref 11.2–14.5)
WBC: 13.1 10*3/uL — ABNORMAL HIGH (ref 3.9–10.3)
lymph#: 1.8 10*3/uL (ref 0.9–3.3)

## 2012-08-29 MED ORDER — DARBEPOETIN ALFA-POLYSORBATE 300 MCG/0.6ML IJ SOLN
300.0000 ug | Freq: Once | INTRAMUSCULAR | Status: DC
  Filled 2012-08-29: qty 0.6

## 2012-09-04 ENCOUNTER — Ambulatory Visit (INDEPENDENT_AMBULATORY_CARE_PROVIDER_SITE_OTHER): Payer: Medicare Other | Admitting: Family Medicine

## 2012-09-04 ENCOUNTER — Ambulatory Visit (HOSPITAL_BASED_OUTPATIENT_CLINIC_OR_DEPARTMENT_OTHER)
Admission: RE | Admit: 2012-09-04 | Discharge: 2012-09-04 | Disposition: A | Discharge status: Home / Self Care | Payer: Medicare Other | Source: Ambulatory Visit | Attending: Family Medicine | Admitting: Family Medicine

## 2012-09-04 ENCOUNTER — Encounter: Payer: Self-pay | Admitting: Family Medicine

## 2012-09-04 VITALS — BP 118/80 | HR 76 | Temp 98.0°F | Ht 60.0 in | Wt 98.0 lb

## 2012-09-04 DIAGNOSIS — R05 Cough: Secondary | ICD-10-CM | POA: Insufficient documentation

## 2012-09-04 DIAGNOSIS — R059 Cough, unspecified: Secondary | ICD-10-CM

## 2012-09-04 DIAGNOSIS — E119 Type 2 diabetes mellitus without complications: Secondary | ICD-10-CM

## 2012-09-04 DIAGNOSIS — R634 Abnormal weight loss: Secondary | ICD-10-CM

## 2012-09-04 DIAGNOSIS — N189 Chronic kidney disease, unspecified: Secondary | ICD-10-CM

## 2012-09-04 DIAGNOSIS — N390 Urinary tract infection, site not specified: Secondary | ICD-10-CM

## 2012-09-04 DIAGNOSIS — I10 Essential (primary) hypertension: Secondary | ICD-10-CM

## 2012-09-04 DIAGNOSIS — I472 Ventricular tachycardia, unspecified: Secondary | ICD-10-CM

## 2012-09-04 DIAGNOSIS — E785 Hyperlipidemia, unspecified: Secondary | ICD-10-CM

## 2012-09-04 DIAGNOSIS — J8410 Pulmonary fibrosis, unspecified: Secondary | ICD-10-CM | POA: Insufficient documentation

## 2012-09-04 HISTORY — DX: Abnormal weight loss: R63.4

## 2012-09-04 LAB — LIPID PANEL
Cholesterol: 126 mg/dL (ref 0–200)
LDL Cholesterol: 43 mg/dL (ref 0–99)
Total CHOL/HDL Ratio: 2.7 Ratio
VLDL: 37 mg/dL (ref 0–40)

## 2012-09-04 LAB — RENAL FUNCTION PANEL
Albumin: 3.8 g/dL (ref 3.5–5.2)
BUN: 25 mg/dL — ABNORMAL HIGH (ref 6–23)
CO2: 28 mEq/L (ref 19–32)
Calcium: 10.1 mg/dL (ref 8.4–10.5)
Creat: 1.52 mg/dL — ABNORMAL HIGH (ref 0.50–1.10)
Glucose, Bld: 112 mg/dL — ABNORMAL HIGH (ref 70–99)
Sodium: 142 mEq/L (ref 135–145)

## 2012-09-04 LAB — HEPATIC FUNCTION PANEL
ALT: 12 U/L (ref 0–35)
AST: 16 U/L (ref 0–37)
Albumin: 3.8 g/dL (ref 3.5–5.2)
Bilirubin, Direct: 0.1 mg/dL (ref 0.0–0.3)
Total Protein: 7.3 g/dL (ref 6.0–8.3)

## 2012-09-04 LAB — TSH: TSH: 0.446 u[IU]/mL (ref 0.350–4.500)

## 2012-09-04 LAB — T4, FREE: Free T4: 1.85 ng/dL — ABNORMAL HIGH (ref 0.80–1.80)

## 2012-09-04 NOTE — Assessment & Plan Note (Signed)
hgba1c checked today, minimize simple carbs and continue current meds

## 2012-09-04 NOTE — Assessment & Plan Note (Signed)
Has appt with cardiology next month, vitals good in office but has been having some palpitations recently, roughly 3 episodes weekly can occur at rest or with exercise. Tends to improve with rest. Has some mild SOB associated

## 2012-09-04 NOTE — Assessment & Plan Note (Signed)
Is following now with urology

## 2012-09-04 NOTE — Assessment & Plan Note (Signed)
enocuraged Boost 2-3 times a day and recheck weight next month

## 2012-09-04 NOTE — Assessment & Plan Note (Signed)
Well controlled no change in meds today

## 2012-09-04 NOTE — Patient Instructions (Addendum)

## 2012-09-05 ENCOUNTER — Encounter: Payer: Self-pay | Admitting: Family Medicine

## 2012-09-05 DIAGNOSIS — N189 Chronic kidney disease, unspecified: Secondary | ICD-10-CM | POA: Insufficient documentation

## 2012-09-05 HISTORY — DX: Chronic kidney disease, unspecified: N18.9

## 2012-09-05 NOTE — Progress Notes (Signed)
Patient ID: [name], female   DOB: 1923-06-22, 79 y.o.   MRN: [id number] KENNEDEY DIGILIO [id number] [birth year] 09/05/2012      Progress Note-Follow Up  Subjective  Chief Complaint  Chief Complaint  Patient presents with  . Allergic Reaction  . Cough    HPI  79 year old Caucasian female who is in today for followup and ongoing complaints of fatigue and anorexia. She is a cough and congestion as well as some shortness of breath and ongoing anxiety. She is struggling with intermittent episodes of palpitations which occur randomly. No associated shortness or breath or chest pains are happening at the same time. She's not generally been checking her blood sugars but denies polyuria or polydipsia. Reports she is taking her medications routinely.  Past Medical History  Diagnosis Date  . CAD (coronary artery disease)     a. 10/2007 PCI Diag w/ 2.25 x 8mm Taxus Atom DES.;  b. 09/2011 Lexi MV: EF 69%, no ischemia/infarct.  Marland Kitchen PVD (peripheral vascular disease)   . Carotid artery stenosis   . HTN (hypertension)   . Hyperlipidemia   . Hypothyroidism   . CKD (chronic kidney disease), stage III   . Diabetes mellitus 07/16/2011  . Allergic state 07/16/2011  . Insomnia 07/16/2011  . Recurrent UTI 07/16/2011  . Asthma     childhood, recurrent at 37  . Myelodysplastic syndrome 04/02/2011  . Sinusitis 07/18/2011  . Anemia 07/18/2011  . Arthritis 07/18/2011  . Cataract extraction status of right eye 08/18/2011  . Anxiety and depression 08/18/2011  . Bronchitis, acute 12/20/2011  . Headache 03/02/2012  . AV heart block     a. 10/2007 MDT Versa dual chamber PPM.  . Bladder cancer 12/2002  . Closed intertrochanteric fracture of left hip 05/27/2012  . Loss of weight 09/04/2012  . Renal failure, chronic 09/05/2012    Past Surgical History  Procedure Laterality Date  . Cholecystectomy    . Pcm - medtronic    . Abdominal hysterectomy    . Shoulder open rotator cuff repair    . Foot surgery      bunionectomy   . Intramedullary (im) nail intertrochanteric  05/27/2012    Procedure: INTRAMEDULLARY (IM) NAIL INTERTROCHANTRIC;  Surgeon: Eulas Post, MD;  Location: [facility] OR;  Service: Orthopedics;  Laterality: Left;    Family History  Problem Relation Age of Onset  . Heart disease Mother     chf  . Arthritis Mother   . Arthritis Father   . Kidney disease Father   . Hypertension Father   . Cancer Brother     lung  . Arthritis Daughter     MVA with very serious injuries    History   Social History  . Marital Status: Widowed    Spouse Name: N/A    Number of Children: N/A  . Years of Education: N/A   Occupational History  . Not on file.   Social History Main Topics  . Smoking status: Never Smoker   . Smokeless tobacco: Never Used  . Alcohol Use: No  . Drug Use: No  . Sexually Active: No   Other Topics Concern  . Not on file   Social History Narrative  . No narrative on file    Current Outpatient Prescriptions on File Prior to Visit  Medication Sig Dispense Refill  . acetaminophen (TYLENOL) 500 MG tablet Take 500 mg by mouth daily as needed. For pain      . amLODipine (NORVASC) 5 MG tablet  Take 1 tablet (5 mg total) by mouth daily.  30 tablet  1  . aspirin 81 MG tablet Take 1 tablet (81 mg total) by mouth daily.  30 tablet    . levothyroxine (SYNTHROID, LEVOTHROID) 112 MCG tablet Take 1 tablet (112 mcg total) by mouth daily.  30 tablet  1  . lisinopril (PRINIVIL,ZESTRIL) 2.5 MG tablet TAKE 1 TABLET BY MOUTH DAILY  30 tablet  0  . metFORMIN (GLUCOPHAGE) 1000 MG tablet TAKE 1 TABLET BY MOUTH TWICE A DAY WITH MEAL  60 tablet  4  . methocarbamol (ROBAXIN) 500 MG tablet Take 1 tablet (500 mg total) by mouth every 6 (six) hours as needed.  60 tablet  0  . PARoxetine (PAXIL) 10 MG tablet TAKE 1 TABLET BY MOUTH IN THE MORNING  30 tablet  1  . rosuvastatin (CRESTOR) 20 MG tablet Take 1 tablet (20 mg total) by mouth at bedtime.  30 tablet  1  . sennosides-docusate sodium (SENOKOT-S)  8.6-50 MG tablet Take 1 tablet by mouth daily.  30 tablet  1   No current facility-administered medications on file prior to visit.    Allergies  Allergen Reactions  . Codeine Anaphylaxis and Swelling  . Penicillins Anaphylaxis and Swelling  . Sulfonamide Derivatives Anaphylaxis and Swelling    Review of Systems  Review of Systems  Constitutional: Positive for malaise/fatigue. Negative for fever.  HENT: Negative for congestion.   Eyes: Negative for discharge.  Respiratory: Positive for cough. Negative for shortness of breath.   Cardiovascular: Negative for chest pain, palpitations and leg swelling.  Gastrointestinal: Negative for nausea, abdominal pain and diarrhea.  Genitourinary: Negative for dysuria.  Musculoskeletal: Negative for falls.  Skin: Negative for rash.  Neurological: Negative for loss of consciousness and headaches.  Endo/Heme/Allergies: Negative for polydipsia.  Psychiatric/Behavioral: Negative for depression and suicidal ideas. The patient is not nervous/anxious and does not have insomnia.     Objective  BP 118/80  Pulse 76  Temp(Src) 98 F (36.7 C) (Oral)  Ht 5' (1.524 m)  Wt 98 lb (44.453 kg)  BMI 19.14 kg/m2  Physical Exam  Physical Exam  Constitutional: She is oriented to person, place, and time and well-developed, well-nourished, and in no distress. No distress.  HENT:  Head: Normocephalic and atraumatic.  Eyes: Conjunctivae are normal.  Neck: Neck supple. No thyromegaly present.  Cardiovascular: Normal rate, regular rhythm and normal heart sounds.   No murmur heard. Pulmonary/Chest: Effort normal and breath sounds normal. She has no wheezes.  Abdominal: She exhibits no distension and no mass.  Musculoskeletal: She exhibits no edema.  Lymphadenopathy:    She has no cervical adenopathy.  Neurological: She is alert and oriented to person, place, and time.  Skin: Skin is warm and dry. No rash noted. She is not diaphoretic.  Psychiatric:  Memory, affect and judgment normal.    Lab Results  Component Value Date   TSH 0.446 09/04/2012   Lab Results  Component Value Date   WBC 13.1* 08/29/2012   HGB 11.0* 08/29/2012   HCT 33.6* 08/29/2012   MCV 104.3* 08/29/2012   PLT 140* 08/29/2012   Lab Results  Component Value Date   CREATININE 1.52* 09/04/2012   BUN 25* 09/04/2012   NA 142 09/04/2012   K 4.7 09/04/2012   CL 100 09/04/2012   CO2 28 09/04/2012   Lab Results  Component Value Date   ALT 12 09/04/2012   AST 16 09/04/2012   ALKPHOS 50 09/04/2012   BILITOT  0.5 09/04/2012   Lab Results  Component Value Date   CHOL 126 09/04/2012   Lab Results  Component Value Date   HDL 46 09/04/2012   Lab Results  Component Value Date   LDLCALC 43 09/04/2012   Lab Results  Component Value Date   TRIG 184* 09/04/2012   Lab Results  Component Value Date   CHOLHDL 2.7 09/04/2012     Assessment & Plan  HYPERTENSION, UNSPECIFIED Well controlled no change in meds today  Diabetes mellitus hgba1c checked today, minimize simple carbs and continue current meds  Recurrent UTI Is following now with urology  Loss of weight enocuraged Boost 2-3 times a day and recheck weight next month  VENTRICULAR TACHYCARDIA Has appt with cardiology next month, vitals good in office but has been having some palpitations recently, roughly 3 episodes weekly can occur at rest or with exercise. Tends to improve with rest. Has some mild SOB associated  RENAL FAILURE, CHRONIC Creatinine up some, encouraged improved po intake, hydration and recheck level next month

## 2012-09-05 NOTE — Assessment & Plan Note (Signed)
Creatinine up some, encouraged improved po intake, hydration and recheck level next month

## 2012-09-13 ENCOUNTER — Ambulatory Visit (INDEPENDENT_AMBULATORY_CARE_PROVIDER_SITE_OTHER): Payer: Medicare Other | Admitting: Internal Medicine

## 2012-09-13 ENCOUNTER — Encounter: Payer: Self-pay | Admitting: Internal Medicine

## 2012-09-13 VITALS — BP 101/52 | HR 68 | Ht 60.0 in | Wt 102.4 lb

## 2012-09-13 DIAGNOSIS — W19XXXA Unspecified fall, initial encounter: Secondary | ICD-10-CM

## 2012-09-13 DIAGNOSIS — I441 Atrioventricular block, second degree: Secondary | ICD-10-CM

## 2012-09-13 DIAGNOSIS — Z950 Presence of cardiac pacemaker: Secondary | ICD-10-CM

## 2012-09-13 LAB — PACEMAKER DEVICE OBSERVATION
AL AMPLITUDE: 0.7 mv
AL THRESHOLD: 0.75 V
BAMS-0001: 150 {beats}/min
RV LEAD IMPEDENCE PM: 454 Ohm
VENTRICULAR PACING PM: 100

## 2012-09-13 NOTE — Assessment & Plan Note (Signed)
Stable post pacing 

## 2012-09-13 NOTE — Assessment & Plan Note (Signed)
Encouraged her to pursue PT for balance

## 2012-09-13 NOTE — Patient Instructions (Signed)
Your physician wants you to follow-up in: 6 months with the device clinic & 1 year with Dr. Klein. You will receive a reminder letter in the mail two months in advance. If you don't receive a letter, please call our office to schedule the follow-up appointment.  Your physician recommends that you continue on your current medications as directed. Please refer to the Current Medication list given to you today.    

## 2012-09-13 NOTE — Progress Notes (Signed)
Patient Care Team: Bradd Canary, MD as PCP - General (Family Medicine)   HPI  [name] is a 79 y.o. female followup for pacemaker implantation for symptomatic bradycardia with high-grade heart block and post  micturition syncope.     She has a history of CAD, s/p Taxus DES to the oD1 in 6/09 and residual 70% in prox right PL branch treated medically  8/12 Normal lexiscan myovue EF 70% this was following an admission for chest pain August 2012.   She has had some falls with trauma  Balance issues   a  Past Medical History  Diagnosis Date  . CAD (coronary artery disease)     a. 10/2007 PCI Diag w/ 2.25 x 8mm Taxus Atom DES.;  b. 09/2011 Lexi MV: EF 69%, no ischemia/infarct.  Marland Kitchen PVD (peripheral vascular disease)   . Carotid artery stenosis   . HTN (hypertension)   . Hyperlipidemia   . Hypothyroidism   . CKD (chronic kidney disease), stage III   . Diabetes mellitus 07/16/2011  . Allergic state 07/16/2011  . Insomnia 07/16/2011  . Recurrent UTI 07/16/2011  . Asthma     childhood, recurrent at 89  . Myelodysplastic syndrome 04/02/2011  . Sinusitis 07/18/2011  . Anemia 07/18/2011  . Arthritis 07/18/2011  . Cataract extraction status of right eye 08/18/2011  . Anxiety and depression 08/18/2011  . Bronchitis, acute 12/20/2011  . Headache 03/02/2012  . AV heart block     a. 10/2007 MDT Versa dual chamber PPM.  . Bladder cancer 12/2002  . Closed intertrochanteric fracture of left hip 05/27/2012  . Loss of weight 09/04/2012  . Renal failure, chronic 09/05/2012    Past Surgical History  Procedure Laterality Date  . Cholecystectomy    . Pcm - medtronic    . Abdominal hysterectomy    . Shoulder open rotator cuff repair    . Foot surgery      bunionectomy  . Intramedullary (im) nail intertrochanteric  05/27/2012    Procedure: INTRAMEDULLARY (IM) NAIL INTERTROCHANTRIC;  Surgeon: Eulas Post, MD;  Location: [facility] OR;  Service: Orthopedics;  Laterality: Left;    Current Outpatient  Prescriptions  Medication Sig Dispense Refill  . acetaminophen (TYLENOL) 500 MG tablet Take 500 mg by mouth daily as needed. For pain      . amLODipine (NORVASC) 5 MG tablet Take 1 tablet (5 mg total) by mouth daily.  30 tablet  1  . aspirin 81 MG tablet Take 1 tablet (81 mg total) by mouth daily.  30 tablet    . levothyroxine (SYNTHROID, LEVOTHROID) 112 MCG tablet Take 1 tablet (112 mcg total) by mouth daily.  30 tablet  1  . metFORMIN (GLUCOPHAGE) 1000 MG tablet TAKE 1 TABLET BY MOUTH TWICE A DAY WITH MEAL  60 tablet  4  . methocarbamol (ROBAXIN) 500 MG tablet Take 1 tablet (500 mg total) by mouth every 6 (six) hours as needed.  60 tablet  0  . PARoxetine (PAXIL) 10 MG tablet TAKE 1 TABLET BY MOUTH IN THE MORNING  30 tablet  1  . rosuvastatin (CRESTOR) 20 MG tablet Take 1 tablet (20 mg total) by mouth at bedtime.  30 tablet  1  . sennosides-docusate sodium (SENOKOT-S) 8.6-50 MG tablet Take 1 tablet by mouth daily.  30 tablet  1  . Solifenacin Succinate (VESICARE PO) Take by mouth.       No current facility-administered medications for this visit.    Allergies  Allergen Reactions  .  Codeine Anaphylaxis and Swelling  . Penicillins Anaphylaxis and Swelling  . Sulfonamide Derivatives Anaphylaxis and Swelling    Review of Systems negative except from HPI and PMH  Physical Exam BP 101/52  Pulse 68  Ht 5' (1.524 m)  Wt 102 lb 6.4 oz (46.448 kg)  BMI 20 kg/m2 Well developed and well nourished in no acute distress HENT normal E scleral and icterus clear Neck Supple JVP flat; carotids brisk and full Clear to ausculation Device pocket well healed; without hematoma or erythema with some atrophy Regular rate and rhythm, no murmurs gallops or rub Soft with active bowel sounds No clubbing cyanosis none Edema Alert and oriented, grossly normal motor and sensory function Skin Warm and Dry    Assessment and  Plan

## 2012-09-13 NOTE — Assessment & Plan Note (Signed)
The patient's device was interrogated.  The information was reviewed. No changes were made in the programming.    

## 2012-09-19 ENCOUNTER — Other Ambulatory Visit (HOSPITAL_BASED_OUTPATIENT_CLINIC_OR_DEPARTMENT_OTHER): Payer: Medicare Other

## 2012-09-19 ENCOUNTER — Ambulatory Visit: Payer: Medicare Other

## 2012-09-19 DIAGNOSIS — D469 Myelodysplastic syndrome, unspecified: Secondary | ICD-10-CM

## 2012-09-19 DIAGNOSIS — D649 Anemia, unspecified: Secondary | ICD-10-CM

## 2012-09-19 LAB — CBC WITH DIFFERENTIAL/PLATELET
BASO%: 0.3 % (ref 0.0–2.0)
Basophils Absolute: 0 10*3/uL (ref 0.0–0.1)
EOS%: 4.9 % (ref 0.0–7.0)
HGB: 10.2 g/dL — ABNORMAL LOW (ref 11.6–15.9)
MCH: 35.4 pg — ABNORMAL HIGH (ref 25.1–34.0)
MCHC: 32.7 g/dL (ref 31.5–36.0)
MCV: 108.3 fL — ABNORMAL HIGH (ref 79.5–101.0)
MONO%: 8.6 % (ref 0.0–14.0)
NEUT%: 75.8 % (ref 38.4–76.8)
RDW: 13.4 % (ref 11.2–14.5)
lymph#: 1.6 10*3/uL (ref 0.9–3.3)

## 2012-09-19 MED ORDER — DARBEPOETIN ALFA-POLYSORBATE 300 MCG/0.6ML IJ SOLN: 300.0000 ug | Freq: Once | INTRAMUSCULAR | Status: DC

## 2012-10-06 ENCOUNTER — Telehealth: Payer: Self-pay | Admitting: *Deleted

## 2012-10-06 NOTE — Telephone Encounter (Signed)
[name] called from out of town.  She is in [place] and won't be back for a couple weeks.  She is cancelling her lab and injection appointment for 07/13/12.  Will call back to reschedule when she gets back to town.

## 2012-10-10 ENCOUNTER — Ambulatory Visit: Payer: Medicare Other

## 2012-10-10 ENCOUNTER — Other Ambulatory Visit: Payer: Medicare Other

## 2012-10-12 ENCOUNTER — Non-Acute Institutional Stay (SKILLED_NURSING_FACILITY): Payer: Medicare Other | Admitting: Nurse Practitioner

## 2012-10-12 ENCOUNTER — Encounter: Payer: Self-pay | Admitting: Nurse Practitioner

## 2012-10-12 DIAGNOSIS — R0789 Other chest pain: Secondary | ICD-10-CM

## 2012-10-12 DIAGNOSIS — R071 Chest pain on breathing: Secondary | ICD-10-CM

## 2012-10-12 DIAGNOSIS — I10 Essential (primary) hypertension: Secondary | ICD-10-CM

## 2012-10-12 DIAGNOSIS — W19XXXS Unspecified fall, sequela: Secondary | ICD-10-CM

## 2012-10-12 DIAGNOSIS — N390 Urinary tract infection, site not specified: Secondary | ICD-10-CM

## 2012-10-12 DIAGNOSIS — E039 Hypothyroidism, unspecified: Secondary | ICD-10-CM

## 2012-10-12 NOTE — Assessment & Plan Note (Signed)
Currently on 5 day treatment of kelfex- no symptoms of dysuria

## 2012-10-12 NOTE — Assessment & Plan Note (Signed)
Will cont synthroid at 112 mcgs

## 2012-10-12 NOTE — Progress Notes (Signed)
Patient ID: [name], female   DOB: November 24, 1923, 79 y.o.   MRN: [id number]   PCP: Danise Edge, MD  Code Status: Full   Allergies  Allergen Reactions  . Codeine Anaphylaxis and Swelling  . Penicillins Anaphylaxis and Swelling  . Sulfonamide Derivatives Anaphylaxis and Swelling    Chief Complaint: follow up hospitalization  HPI:  79 year old female with a PMH of HTN, hyperlipidemia, hypothyroid, tachybrady syndrome went to the ED s/p mechanical fall while she was on vacation with her cousin in [place]. Pt was getting out of the care and was very weak and unable resulting in a fall. It was unsure if this was a syncopal episode. Pt was found to have an 8 mm subdural hematoma with shift and per neurosurgery she was transfused one unit of platelets but there was no need for surgical intervention due to small size. She had a brief episode of being unconscious but woke up and responded to commands shortly after. No arrythmias found on EKG,echo was WNL. It was recommended for inpatient rehab therefore she is here at [facility] for STR. Due to hematoma pt is to hold ASA for 4 weeks.  Pt is also being treated for UTI with Keflex for 5 days Metformin was stopped in hospital and recommendations for diet control   Review of Systems:  Review of Systems  Constitutional: Negative for fever and chills.  HENT: Negative for congestion.   Respiratory: Negative for cough and shortness of breath.   Cardiovascular: Negative for chest pain, palpitations, claudication and leg swelling.       Tenderness to right chest wall   Gastrointestinal: Negative for abdominal pain, diarrhea and constipation.  Genitourinary: Negative for dysuria, urgency and frequency.  Musculoskeletal: Negative for joint pain. Falls: no additional falls.  Skin:       Bruising to right head and neck   Neurological: Negative for dizziness, weakness and headaches.  Psychiatric/Behavioral: Negative for depression. The patient is not  nervous/anxious and does not have insomnia.      Past Medical History  Diagnosis Date  . CAD (coronary artery disease)     a. 10/2007 PCI Diag w/ 2.25 x 8mm Taxus Atom DES.;  b. 09/2011 Lexi MV: EF 69%, no ischemia/infarct.  Marland Kitchen PVD (peripheral vascular disease)   . Carotid artery stenosis   . HTN (hypertension)   . Hyperlipidemia   . Hypothyroidism   . CKD (chronic kidney disease), stage III   . Diabetes mellitus 07/16/2011  . Allergic state 07/16/2011  . Insomnia 07/16/2011  . Recurrent UTI 07/16/2011  . Asthma     childhood, recurrent at 23  . Myelodysplastic syndrome 04/02/2011  . Sinusitis 07/18/2011  . Anemia 07/18/2011  . Arthritis 07/18/2011  . Cataract extraction status of right eye 08/18/2011  . Anxiety and depression 08/18/2011  . Bronchitis, acute 12/20/2011  . Headache(784.0) 03/02/2012  . AV heart block     a. 10/2007 MDT Versa dual chamber PPM.  . Bladder cancer 12/2002  . Closed intertrochanteric fracture of left hip 05/27/2012  . Loss of weight 09/04/2012  . Renal failure, chronic 09/05/2012   Past Surgical History  Procedure Laterality Date  . Cholecystectomy    . Pcm - medtronic    . Abdominal hysterectomy    . Shoulder open rotator cuff repair    . Foot surgery      bunionectomy  . Intramedullary (im) nail intertrochanteric  05/27/2012    Procedure: INTRAMEDULLARY (IM) NAIL INTERTROCHANTRIC;  Surgeon: Burtis Junes  Dion Saucier, MD;  Location: [facility] OR;  Service: Orthopedics;  Laterality: Left;   Social History:   reports that she has never smoked. She has never used smokeless tobacco. She reports that she does not drink alcohol or use illicit drugs.  Family History  Problem Relation Age of Onset  . Heart disease Mother     chf  . Arthritis Mother   . Arthritis Father   . Kidney disease Father   . Hypertension Father   . Cancer Brother     lung  . Arthritis Daughter     MVA with very serious injuries    Medications: Patient's Medications  New Prescriptions   No medications  on file  Previous Medications   ACETAMINOPHEN (TYLENOL) 500 MG TABLET    Take 500 mg by mouth daily as needed. For pain   CEPHALEXIN (KEFLEX) 500 MG CAPSULE    Take 500 mg by mouth 2 (two) times daily.   LEVOTHYROXINE (SYNTHROID, LEVOTHROID) 112 MCG TABLET    Take 1 tablet (112 mcg total) by mouth daily.   METHOCARBAMOL (ROBAXIN) 500 MG TABLET    Take 1 tablet (500 mg total) by mouth every 6 (six) hours as needed.   PAROXETINE (PAXIL) 10 MG TABLET    TAKE 1 TABLET BY MOUTH IN THE MORNING   ROSUVASTATIN (CRESTOR) 20 MG TABLET    Take 1 tablet (20 mg total) by mouth at bedtime.   SENNOSIDES-DOCUSATE SODIUM (SENOKOT-S) 8.6-50 MG TABLET    Take 1 tablet by mouth daily.   SOLIFENACIN SUCCINATE (VESICARE PO)    Take by mouth.  Modified Medications   Modified Medication Previous Medication   AMLODIPINE (NORVASC) 5 MG TABLET amLODipine (NORVASC) 5 MG tablet      Take 10 mg by mouth daily.    Take 1 tablet (5 mg total) by mouth daily.  Discontinued Medications   ASPIRIN 81 MG TABLET    Take 1 tablet (81 mg total) by mouth daily.   METFORMIN (GLUCOPHAGE) 1000 MG TABLET    TAKE 1 TABLET BY MOUTH TWICE A DAY WITH MEAL     Physical Exam:  Filed Vitals:   10/12/12 1631  BP: 138/68  Pulse: 69  Temp: 97 F (36.1 C)  Resp: 18  SpO2: 99%    Physical Exam  Nursing note and vitals reviewed. Constitutional: She is oriented to person, place, and time. She appears well-developed and well-nourished. No distress.  HENT:  Hematoma to right side head with bruising down through right side of neck   Eyes: EOM are normal. Pupils are equal, round, and reactive to light.  Neck: Normal range of motion. Neck supple. No thyromegaly present.  Cardiovascular: Normal rate, regular rhythm and normal heart sounds.   Pulmonary/Chest: Effort normal and breath sounds normal. No respiratory distress.  Abdominal: Soft. Bowel sounds are normal. She exhibits no distension. There is no tenderness.  Musculoskeletal: Normal  range of motion. She exhibits no edema and no tenderness.  Lymphadenopathy:    She has no cervical adenopathy.  Neurological: She is alert and oriented to person, place, and time.  Skin: Skin is warm and dry. She is not diaphoretic.  Psychiatric: She has a normal mood and affect.     Labs reviewed: Basic Metabolic Panel:  Recent Labs  16/10/96 1451  04/26/12 1523  06/01/12 0710 06/07/12 0600 09/04/12 1404  NA 137  < > 141  < > 142 141 142  K 4.5  < > 5.1  < > 4.9 3.8  4.7  CL 101  < > 100  < > 102 105 100  CO2 26  < > 29  < > 29 28 28   GLUCOSE 175*  < > 165*  < > 134* 135* 112*  BUN 21  < > 21  < > 24* 25* 25*  CREATININE 1.2  < > 1.4*  < > 1.32* 1.23* 1.52*  CALCIUM 9.5  < > 9.7  < > 9.2 9.1 10.1  PHOS 3.2  --  3.8  --   --   --  4.4  < > = values in this interval not displayed. Liver Function Tests:  Recent Labs  04/26/12 1523 06/01/12 0710 09/04/12 1404  AST 25 28 16   ALT 22 21 12   ALKPHOS 49 36* 50  BILITOT 0.8 0.9 0.5  PROT 7.8 6.4 7.3  ALBUMIN 4.0  4.0 2.3* 3.8  3.8   No results found for this basename: LIPASE, AMYLASE,  in the last 8760 hours No results found for this basename: AMMONIA,  in the last 8760 hours CBC:  Recent Labs  08/10/12 1117 08/29/12 1029 09/19/12 1044  WBC 12.0* 13.1* 14.9*  NEUTROABS 9.4* 9.2* 11.3*  HGB 11.0* 11.0* 10.2*  HCT 34.4* 33.6* 31.2*  MCV 104.2* 104.3* 108.3*  PLT 140* 140* 132*   Cardiac Enzymes:  Recent Labs  03/27/12 2217 03/28/12 0320 03/28/12 1012  TROPONINI <0.30 <0.30 <0.30   BNP: No components found with this basename: POCBNP,  CBG:  Recent Labs  06/09/12 1624 06/09/12 2055 06/10/12 0746  GLUCAP 134* 116* 123*     Assessment/Plan Recurrent UTI Currently on 5 day treatment of kelfex- no symptoms of dysuria  HYPOTHYROIDISM Will cont synthroid at 112 mcgs  HYPERTENSION, UNSPECIFIED Patients hypertension is stable; to continue current regimen. Will monitor and make changes as  necessary.   Falls S/p fall resulting in hematoma- here at [facility] for rehab per therapy   Chest wall tenderness Worse with movement; breathing, painful to touch- will get xray to rule out rib fractures.    Rehab Status: Good  Family/ staff Communication: Daughter at bedside; discussed plan of care; reports she does not think her mother will need to have inpatient rehab for very long. Already showing improvement

## 2012-10-12 NOTE — Assessment & Plan Note (Signed)
Patients hypertension is stable; to continue current regimen. Will monitor and make changes as necessary.

## 2012-10-12 NOTE — Assessment & Plan Note (Signed)
S/p fall resulting in hematoma- here at [facility] for rehab per therapy

## 2012-10-16 ENCOUNTER — Ambulatory Visit: Payer: Medicare Other | Admitting: Family Medicine

## 2012-10-19 ENCOUNTER — Encounter: Payer: Self-pay | Admitting: Internal Medicine

## 2012-10-20 NOTE — Progress Notes (Signed)
Date: 10/20/2012  MRN:  [id number] Name:  KAELA BEITZ Sex:  female Age:  79 y.o. DOB:[birth year]                         Facility/Room; [facility] Level Of Care:SNF Provider: Dr. Murray Hodgkins  Emergency Contacts: Contact Information   Name Relation Home Work Mobile   Whitt,Patricia Daughter [phone]  [phone]   [place]ba, Delcastillo [phone]  [phone]      Code Status:Full Code MOST Form:  Allergies: Allergies  Allergen Reactions  . Codeine Anaphylaxis and Swelling  . Penicillins Anaphylaxis and Swelling  . Sulfonamide Derivatives Anaphylaxis and Swelling     Chief Complaint  Patient presents with  . Medical Managment of Chronic Issues    New admit to SNF following hospitalization in [place] for falling     HPI: 79 year old female with a PMH of HTN, hyperlipidemia, hypothyroid, tachybrady syndrome went to the ED s/p mechanical fall while she was on vacation with her cousin in [place]. Pt was getting out of the care and was very weak and unable resulting in a fall. It was unsure if this was a syncopal episode. Pt was found to have an 8 mm subdural hematoma with shift and per neurosurgery she was transfused one unit of platelets but there was no need for surgical intervention due to small size. She had a brief episode of being unconscious but woke up and responded to commands shortly after. No arrythmias found on EKG,echo was WNL. It was recommended for inpatient rehab therefore she is here at [facility] for STR. Due to hematoma pt is to hold ASA for 4 weeks.  Pt is also being treated for UTI with Keflex for 5 days  Metformin was stopped in hospital and recommendations for diet control   Past Medical History  Diagnosis Date  . CAD (coronary artery disease)     a. 10/2007 PCI Diag w/ 2.25 x 8mm Taxus Atom DES.;  b. 09/2011 Lexi MV: EF 69%, no ischemia/infarct.  Marland Kitchen PVD (peripheral vascular disease)   . Carotid artery stenosis   . HTN (hypertension)   .  Hyperlipidemia   . Hypothyroidism   . CKD (chronic kidney disease), stage III   . Diabetes mellitus 07/16/2011  . Allergic state 07/16/2011  . Insomnia 07/16/2011  . Recurrent UTI 07/16/2011  . Asthma     childhood, recurrent at 46  . Myelodysplastic syndrome 04/02/2011  . Sinusitis 07/18/2011  . Anemia 07/18/2011  . Arthritis 07/18/2011  . Cataract extraction status of right eye 08/18/2011  . Anxiety and depression 08/18/2011  . Bronchitis, acute 12/20/2011  . Headache(784.0) 03/02/2012  . AV heart block     a. 10/2007 MDT Versa dual chamber PPM.  . Bladder cancer 12/2002  . Closed intertrochanteric fracture of left hip 05/27/2012  . Loss of weight 09/04/2012  . Renal failure, chronic 09/05/2012    Past Surgical History  Procedure Laterality Date  . Cholecystectomy    . Pcm - medtronic    . Abdominal hysterectomy    . Shoulder open rotator cuff repair    . Foot surgery      bunionectomy  . Intramedullary (im) nail intertrochanteric  05/27/2012    Procedure: INTRAMEDULLARY (IM) NAIL INTERTROCHANTRIC;  Surgeon: Eulas Post, MD;  Location: [facility] OR;  Service: Orthopedics;  Laterality: Left;     Procedures: Consultants:  Current Outpatient Prescriptions  Medication Sig Dispense Refill  . acetaminophen (TYLENOL) 500 MG tablet  Take 500 mg by mouth daily as needed. For pain      . amLODipine (NORVASC) 5 MG tablet Take 10 mg by mouth daily.      . cephALEXin (KEFLEX) 500 MG capsule Take 500 mg by mouth 2 (two) times daily.      Marland Kitchen levothyroxine (SYNTHROID, LEVOTHROID) 112 MCG tablet Take 1 tablet (112 mcg total) by mouth daily.  30 tablet  1  . methocarbamol (ROBAXIN) 500 MG tablet Take 1 tablet (500 mg total) by mouth every 6 (six) hours as needed.  60 tablet  0  . PARoxetine (PAXIL) 10 MG tablet TAKE 1 TABLET BY MOUTH IN THE MORNING  30 tablet  1  . rosuvastatin (CRESTOR) 20 MG tablet Take 1 tablet (20 mg total) by mouth at bedtime.  30 tablet  1  . sennosides-docusate sodium (SENOKOT-S) 8.6-50 MG  tablet Take 1 tablet by mouth daily.  30 tablet  1  . Solifenacin Succinate (VESICARE PO) Take by mouth.       No current facility-administered medications for this visit.    Immunization History  Administered Date(s) Administered  . Influenza Split 02/08/2012     Diet:  History  Substance Use Topics  . Smoking status: Never Smoker   . Smokeless tobacco: Never Used  . Alcohol Use: No    Family History  Problem Relation Age of Onset  . Heart disease Mother     chf  . Arthritis Mother   . Arthritis Father   . Kidney disease Father   . Hypertension Father   . Cancer Brother     lung  . Arthritis Daughter     MVA with very serious injuries     Vital signs: BP 134/79  Pulse 60  Resp 20  Ht 5' (1.524 m)  Wt 102 lb 12.8 oz (46.63 kg)  BMI 20.08 kg/m2   General Appearance:    Alert, cooperative, no distress, appears stated age  Head:    Normocephalic, without obvious abnormality, atraumatic  Eyes:    PERRL, conjunctiva/corneas clear, EOM's intact, fundi    benign, both eyes  Ears:    Normal TM's and external ear canals, both ears  Nose:   Nares normal, septum midline, mucosa normal, no drainage    or sinus tenderness  Throat:   Lips, mucosa, and tongue normal; teeth and gums normal  Neck:   Supple, symmetrical, trachea midline, no adenopathy;    thyroid:  no enlargement/tenderness/nodules; no carotid   bruit or JVD  Back:     Symmetric, no curvature, ROM normal, no CVA tenderness  Lungs:     Clear to auscultation bilaterally, respirations unlabored  Chest Wall:    No tenderness or deformity   Heart:    Regular rate and rhythm, S1 and S2 normal, no murmur, rub   or gallop  Breast Exam:    No tenderness, masses, or nipple abnormality  Abdomen:     Soft, non-tender, bowel sounds active all four quadrants,    no masses, no organomegaly  Genitalia:    Normal female without lesion, discharge or tenderness  Rectal:    Normal tone, normal prostate, no masses or  tenderness;   guaiac negative stool  Extremities:   Extremities normal, atraumatic, no cyanosis or edema  Pulses:   2+ and symmetric all extremities  Skin:   Skin color, texture, turgor normal, no rashes or lesions  Lymph nodes:   Cervical, supraclavicular, and axillary nodes normal  Neurologic:   CNII-XII  intact, normal strength, sensation and reflexes    throughout    Screening Score  MMS    PHQ2    PHQ9     Fall Risk    BIMS    Annual summary: Hospitalizations: Infection History:  Functional assessment: Areas of potential improvement: Rehabilitation Potential: Prognosis for survival: Plan:  This encounter was created in error - please disregard.

## 2012-10-22 DIAGNOSIS — E119 Type 2 diabetes mellitus without complications: Secondary | ICD-10-CM

## 2012-10-22 DIAGNOSIS — S51009A Unspecified open wound of unspecified elbow, initial encounter: Secondary | ICD-10-CM

## 2012-10-22 DIAGNOSIS — Z5189 Encounter for other specified aftercare: Secondary | ICD-10-CM

## 2012-10-22 DIAGNOSIS — S065X9A Traumatic subdural hemorrhage with loss of consciousness of unspecified duration, initial encounter: Secondary | ICD-10-CM

## 2012-10-24 ENCOUNTER — Telehealth: Payer: Self-pay

## 2012-10-24 NOTE — Telephone Encounter (Signed)
A verbal stating that [name] can get orders from urogolist.   Verbal for physical therapy  3 times for 2 weeks and 2 times a week for 4 weeks  Please advise?

## 2012-10-24 NOTE — Telephone Encounter (Signed)
OK to give verbal order for PT and urology

## 2012-10-25 NOTE — Telephone Encounter (Signed)
Left a detailed message on Adriane's vm

## 2012-10-26 ENCOUNTER — Telehealth: Payer: Self-pay

## 2012-10-26 NOTE — Telephone Encounter (Signed)
Have her check it each morning before eating and once an hour to 2 after dinner for the next week and then we can back down to checking less often if numbers look good

## 2012-10-26 NOTE — Telephone Encounter (Signed)
Koleen Nimrod informed and states she will inform pt and have her document this

## 2012-10-26 NOTE — Telephone Encounter (Signed)
FYI: Adriane at [facility] states she went to see pt today and pt is stating that she is dizzy. Pt ate at 1 and about 2:45 pm BS was 73. Pt has been taking her Metformin even though the hospital instructed not to take this.   Koleen Nimrod told pt to stop the Metformin.    How often should pt check her BS since stopping Metformin?  Please advise?

## 2012-10-30 ENCOUNTER — Other Ambulatory Visit (HOSPITAL_BASED_OUTPATIENT_CLINIC_OR_DEPARTMENT_OTHER): Payer: Medicare Other | Admitting: Lab

## 2012-10-30 ENCOUNTER — Ambulatory Visit: Payer: Medicare Other

## 2012-10-30 ENCOUNTER — Ambulatory Visit (HOSPITAL_BASED_OUTPATIENT_CLINIC_OR_DEPARTMENT_OTHER): Payer: Medicare Other | Admitting: Oncology

## 2012-10-30 ENCOUNTER — Telehealth: Payer: Self-pay | Admitting: Oncology

## 2012-10-30 VITALS — BP 125/65 | HR 77 | Temp 97.7°F | Resp 18 | Ht 60.0 in | Wt 97.6 lb

## 2012-10-30 DIAGNOSIS — D469 Myelodysplastic syndrome, unspecified: Secondary | ICD-10-CM

## 2012-10-30 DIAGNOSIS — D649 Anemia, unspecified: Secondary | ICD-10-CM

## 2012-10-30 LAB — CBC WITH DIFFERENTIAL/PLATELET
BASO%: 0.2 % (ref 0.0–2.0)
Eosinophils Absolute: 0.2 10*3/uL (ref 0.0–0.5)
LYMPH%: 12.3 % — ABNORMAL LOW (ref 14.0–49.7)
MCHC: 33.2 g/dL (ref 31.5–36.0)
MCV: 105.2 fL — ABNORMAL HIGH (ref 79.5–101.0)
MONO%: 8.6 % (ref 0.0–14.0)
NEUT#: 10.6 10*3/uL — ABNORMAL HIGH (ref 1.5–6.5)
Platelets: 182 10*3/uL (ref 145–400)
RBC: 2.67 10*6/uL — ABNORMAL LOW (ref 3.70–5.45)
RDW: 13.9 % (ref 11.2–14.5)
WBC: 13.7 10*3/uL — ABNORMAL HIGH (ref 3.9–10.3)

## 2012-10-30 MED ORDER — DARBEPOETIN ALFA-POLYSORBATE 300 MCG/0.6ML IJ SOLN
300.0000 ug | Freq: Once | INTRAMUSCULAR | Status: AC
  Administered 2012-10-30: 300 ug via SUBCUTANEOUS
  Filled 2012-10-30: qty 0.6

## 2012-10-30 NOTE — Progress Notes (Signed)
Hematology and Oncology Follow Up Visit  SERITA DEGROOTE [id number] [birth year] 79 y.o. 10/30/2012 8:35 PM   Principle Diagnosis: Encounter Diagnoses  Name Primary?  . Myelodysplastic syndrome Yes  . Anemia      Interim History:   Followup visit for this now 79 year old woman with a low-grade myelodysplastic syndrome characterized by chronic, macrocytic anemia, mild leukocytosis, and borderline thrombocytopenia. Hemoglobins have been maintained at the 10 g or above range with intermittent Aranesp injections. She has a additional component of chronic renal insufficiency contributing to her anemia with baseline creatinine 1.5.  She has had a bad year. She is unsteady on her feet. She was hospitalized back in January after a fall. She had another fall recently when she went to [facility] in May to visit friends. She fell outside of a local pharmacy and struck her head on the cement sidewalk. Fortunately she did not sustain any fractures. No lacerations. She does believe she was unconscious. She was treated in a local hospital. She then returned to [facility] and has been at the [facility] rehabilitation facility until just one week ago.  Medications: reviewed  Allergies:  Allergies  Allergen Reactions  . Codeine Anaphylaxis and Swelling  . Penicillins Anaphylaxis and Swelling  . Sulfonamide Derivatives Anaphylaxis and Swelling    Review of Systems: Constitutional:   Generalized weakness and deconditioning Respiratory: No dyspnea at rest Cardiovascular:  No chest pain Gastrointestinal: No change in bowel habit Genito-Urinary: No urinary tract symptoms Musculoskeletal: See above Neurologic: No headache or change in vision Skin: No rash Remaining ROS negative.  Physical Exam: Blood pressure 125/65, pulse 77, temperature 97.7 F (36.5 C), temperature source Oral, resp. rate 18, height 5' (1.524 m), weight 97 lb 9.6 oz (44.271 kg). Wt Readings from Last 3 Encounters:   10/30/12 97 lb 9.6 oz (44.271 kg)  10/20/12 102 lb 12.8 oz (46.63 kg)  09/13/12 102 lb 6.4 oz (46.448 kg)     General appearance: Very frail elderly Caucasian woman who is now looking her age. HENNT: Pharynx no erythema or exudate Lymph nodes: No adenopathy Breasts: Lungs: Clear to auscultation resonant to percussion Heart: Regular rhythm no murmur Abdomen: Soft, nontender, no mass, no organomegaly Extremities: No edema, no calf tenderness Musculoskeletal: No joint deformities GU: Vascular: No cyanosis Neurologic: She is alert and oriented, she could do simple calculations with ease. She could spell the word house backwards. PERRLA. Optic disc sharp and vessels normal. Motor strength 5 over 5. Upper body coordination is slow but accurate. Difficulty getting on the exam branch Skin: No rash or ecchymosis  Lab Results: Lab Results  Component Value Date   WBC 13.7* 10/30/2012   HGB 9.3* 10/30/2012   HCT 28.1* 10/30/2012   MCV 105.2* 10/30/2012   PLT 182 10/30/2012     Chemistry      Component Value Date/Time   NA 142 09/04/2012 1404   K 4.7 09/04/2012 1404   CL 100 09/04/2012 1404   CO2 28 09/04/2012 1404   BUN 25* 09/04/2012 1404   CREATININE 1.52* 09/04/2012 1404   CREATININE 1.23* 06/07/2012 0600      Component Value Date/Time   CALCIUM 10.1 09/04/2012 1404   ALKPHOS 50 09/04/2012 1404   AST 16 09/04/2012 1404   ALT 12 09/04/2012 1404   BILITOT 0.5 09/04/2012 1404     Impression: #1. Presumed low risk Myelodysplastic syndrome currently receiving intermittent Aranesp injections to maintain the hemoglobin at greater than 10 g.  #2. Mild chronic renal insufficiency. Creatinine  1.5 #3. Coronary artery disease with history of third degree heart block requiring permanent pacemaker and status post drug-eluting stent to the left anterior descending coronary artery.  #4. Hypothyroid on replacement.  #5. Hyperlipidemia.  #6. Hypertension.   #7. Type 2 diabetes. #8. Ataxia with recurrent  falls. CT brain done October 2012 showed generalized atrophy and moderate chronic microvascular ischemic changes. I wonder if we are seeing late effects of previous alcohol use which might also partially explain her chronic macrocytic anemia? #9. Deconditioning following prolonged rehabilitation/nursing facility stay after a recent fall which occurred in May 2014 right on the tail of another fall which occurred in January 2014 requiring hospitalization and rehabilitation.  Plan:Continue every three-week Aranesp injections   CC:. Dr. Queen Blossom, MD 6/16/20148:35 PM

## 2012-10-30 NOTE — Telephone Encounter (Signed)
gv and printed appt sched and avs for pt  °

## 2012-10-30 NOTE — Progress Notes (Signed)
Aranesp injection given during office visit.

## 2012-10-30 NOTE — Progress Notes (Signed)
A follow-up visit for this 79 year old woman initially evaluated for a moderate microcytic anemia and mild thrombocytopenia back in July 2009. She had a bone marrow aspiration & biopsy on November 23, 2007.  The bone marrow was slightly hypercellular for age, estimated at 40-50%. There was normal maturation in all three-cell lines, with some mild megaloblastic changes in the erythroid precursors. No excess blasts, in fact, zero blasts were seen on a 500-cell differential. No excess plasma cells, which were only 1%. Storage iron was decreased, there were no ringed sideroblasts. She had normal B12 and folic acid levels. Cytogenetic studies normal. I thought she had a low risk myelodysplastic syndrome. She does have mild chronic renal insufficiency which may also be impacting on her anemia.  She's been on intermittent Aranesp injections every 3 weeks to maintain hemoglobin 10 g or above. Overall the program has been successful except when she has acute medical illness that causes a fall in her hemoglobin lower than her baseline.  She has underlying coronary artery disease and is status post coronary stent placement in 2009. She had a pacemaker put him in the past.  She had a recent cardiac evaluation including a pacemaker check and a nuclear medicine test through Dr. Clayborne Artist office in May and everything turned out normal. Estimated left ventricular ejection fraction 89% with normal wall motion.  She has been more unsteady on her feet. She fell down a few weeks ago and fell flat on her face and not got some teeth.

## 2012-11-07 ENCOUNTER — Encounter: Payer: Medicare Other | Admitting: Internal Medicine

## 2012-11-07 ENCOUNTER — Other Ambulatory Visit: Payer: Self-pay | Admitting: Family Medicine

## 2012-11-07 ENCOUNTER — Ambulatory Visit (INDEPENDENT_AMBULATORY_CARE_PROVIDER_SITE_OTHER): Payer: Medicare Other | Admitting: Family Medicine

## 2012-11-07 ENCOUNTER — Encounter: Payer: Self-pay | Admitting: Family Medicine

## 2012-11-07 VITALS — BP 100/60 | HR 74 | Temp 98.3°F | Ht 60.0 in | Wt 97.4 lb

## 2012-11-07 DIAGNOSIS — E039 Hypothyroidism, unspecified: Secondary | ICD-10-CM

## 2012-11-07 DIAGNOSIS — I472 Ventricular tachycardia: Secondary | ICD-10-CM

## 2012-11-07 DIAGNOSIS — D649 Anemia, unspecified: Secondary | ICD-10-CM

## 2012-11-07 DIAGNOSIS — R5383 Other fatigue: Secondary | ICD-10-CM

## 2012-11-07 DIAGNOSIS — W19XXXD Unspecified fall, subsequent encounter: Secondary | ICD-10-CM

## 2012-11-07 DIAGNOSIS — E119 Type 2 diabetes mellitus without complications: Secondary | ICD-10-CM

## 2012-11-07 DIAGNOSIS — R5381 Other malaise: Secondary | ICD-10-CM

## 2012-11-07 DIAGNOSIS — F329 Major depressive disorder, single episode, unspecified: Secondary | ICD-10-CM

## 2012-11-07 DIAGNOSIS — R634 Abnormal weight loss: Secondary | ICD-10-CM

## 2012-11-07 DIAGNOSIS — W19XXXA Unspecified fall, initial encounter: Secondary | ICD-10-CM

## 2012-11-07 DIAGNOSIS — F341 Dysthymic disorder: Secondary | ICD-10-CM

## 2012-11-07 DIAGNOSIS — I10 Essential (primary) hypertension: Secondary | ICD-10-CM

## 2012-11-07 DIAGNOSIS — D539 Nutritional anemia, unspecified: Secondary | ICD-10-CM

## 2012-11-07 DIAGNOSIS — N390 Urinary tract infection, site not specified: Secondary | ICD-10-CM

## 2012-11-07 LAB — HEPATIC FUNCTION PANEL
Alkaline Phosphatase: 61 U/L (ref 39–117)
Bilirubin, Direct: 0.2 mg/dL (ref 0.0–0.3)
Indirect Bilirubin: 0.3 mg/dL (ref 0.0–0.9)
Total Bilirubin: 0.5 mg/dL (ref 0.3–1.2)

## 2012-11-07 LAB — RENAL FUNCTION PANEL
BUN: 19 mg/dL (ref 6–23)
Chloride: 98 mEq/L (ref 96–112)
Phosphorus: 3.2 mg/dL (ref 2.3–4.6)
Potassium: 4.1 mEq/L (ref 3.5–5.3)

## 2012-11-07 LAB — CBC
HCT: 28.9 % — ABNORMAL LOW (ref 36.0–46.0)
MCHC: 33.2 g/dL (ref 30.0–36.0)
Platelets: 204 10*3/uL (ref 150–400)
RDW: 14.6 % (ref 11.5–15.5)
WBC: 15 10*3/uL — ABNORMAL HIGH (ref 4.0–10.5)

## 2012-11-07 LAB — TSH: TSH: 0.374 u[IU]/mL (ref 0.350–4.500)

## 2012-11-07 MED ORDER — VITAMIN B-12 1000 MCG PO TABS: 1000.0000 ug | ORAL_TABLET | Freq: Every day | ORAL | Status: DC

## 2012-11-07 MED ORDER — PAROXETINE HCL 20 MG PO TABS: 20.0000 mg | ORAL_TABLET | ORAL | Status: DC

## 2012-11-07 MED ORDER — CYANOCOBALAMIN 1000 MCG/ML IJ SOLN
1000.0000 ug | Freq: Once | INTRAMUSCULAR | Status: AC
  Administered 2012-11-07: 1000 ug via INTRAMUSCULAR

## 2012-11-07 NOTE — Progress Notes (Signed)
Patient ID: [name], female   DOB: 02/18/1924, 79 y.o.   MRN: [id number] [name] [id number] 12/04/1923 11/07/2012      Progress Note-Follow Up  Subjective  Chief Complaint  Chief Complaint  Patient presents with  . discharged from [facility] on 10-21-12    HPI  Patient is a 79 year old Caucasian female who is here today for hospital followup. She was on vacation in [place] she felt and suffered a subdural hematoma. Spent time in the hospital but never required surgery. Then came home to a skilled nursing facility. She is now back home with hair within her home. She is complaining of many things today but most notably anhedonia, fatigue and weakness. Her care provider agrees is very little motivation to do anything but she also feels very unsteady on her feet. Continues to struggle with urinary symptoms such as frequency and urgency. Has just finished an antibiotic with her urologist. No fevers but she does complain of some occasional episodes of chills. He continues to struggle with anorexia. Is eating but very small amounts. No GI complaints is moving her bowels well. No headaches or neurologic changes are noted.  Past Medical History  Diagnosis Date  . CAD (coronary artery disease)     a. 10/2007 PCI Diag w/ 2.25 x 8mm Taxus Atom DES.;  b. 09/2011 Lexi MV: EF 69%, no ischemia/infarct.  Marland Kitchen PVD (peripheral vascular disease)   . Carotid artery stenosis   . HTN (hypertension)   . Hyperlipidemia   . Hypothyroidism   . CKD (chronic kidney disease), stage III   . Diabetes mellitus 07/16/2011  . Allergic state 07/16/2011  . Insomnia 07/16/2011  . Recurrent UTI 07/16/2011  . Asthma     childhood, recurrent at 50  . Myelodysplastic syndrome 04/02/2011  . Sinusitis 07/18/2011  . Anemia 07/18/2011  . Arthritis 07/18/2011  . Cataract extraction status of right eye 08/18/2011  . Anxiety and depression 08/18/2011  . Bronchitis, acute 12/20/2011  . Headache(784.0) 03/02/2012  . AV heart  block     a. 10/2007 MDT Versa dual chamber PPM.  . Bladder cancer 12/2002  . Closed intertrochanteric fracture of left hip 05/27/2012  . Loss of weight 09/04/2012  . Renal failure, chronic 09/05/2012    Past Surgical History  Procedure Laterality Date  . Cholecystectomy    . Pcm - medtronic    . Abdominal hysterectomy    . Shoulder open rotator cuff repair    . Foot surgery      bunionectomy  . Intramedullary (im) nail intertrochanteric  05/27/2012    Procedure: INTRAMEDULLARY (IM) NAIL INTERTROCHANTRIC;  Surgeon: Eulas Post, MD;  Location: [facility] OR;  Service: Orthopedics;  Laterality: Left;    Family History  Problem Relation Age of Onset  . Heart disease Mother     chf  . Arthritis Mother   . Arthritis Father   . Kidney disease Father   . Hypertension Father   . Cancer Brother     lung  . Arthritis Daughter     MVA with very serious injuries    History   Social History  . Marital Status: Widowed    Spouse Name: N/A    Number of Children: N/A  . Years of Education: N/A   Occupational History  . Not on file.   Social History Main Topics  . Smoking status: Never Smoker   . Smokeless tobacco: Never Used  . Alcohol Use: No  . Drug Use:  No  . Sexually Active: No   Other Topics Concern  . Not on file   Social History Narrative  . No narrative on file    Current Outpatient Prescriptions on File Prior to Visit  Medication Sig Dispense Refill  . acetaminophen (TYLENOL) 500 MG tablet Take 500 mg by mouth daily as needed. For pain      . amLODipine (NORVASC) 5 MG tablet Take 10 mg by mouth daily.      Marland Kitchen levothyroxine (SYNTHROID, LEVOTHROID) 112 MCG tablet Take 1 tablet (112 mcg total) by mouth daily.  30 tablet  1  . rosuvastatin (CRESTOR) 20 MG tablet Take 1 tablet (20 mg total) by mouth at bedtime.  30 tablet  1   No current facility-administered medications on file prior to visit.    Allergies  Allergen Reactions  . Codeine Anaphylaxis and Swelling  .  Penicillins Anaphylaxis and Swelling  . Sulfonamide Derivatives Anaphylaxis and Swelling    Review of Systems  Review of Systems  Constitutional: Positive for weight loss and malaise/fatigue. Negative for fever.  HENT: Negative for congestion.   Eyes: Negative for discharge.  Respiratory: Negative for shortness of breath.   Cardiovascular: Negative for chest pain, palpitations and leg swelling.  Gastrointestinal: Negative for nausea, abdominal pain and diarrhea.  Genitourinary: Negative for dysuria.  Musculoskeletal: Positive for falls.  Skin: Negative for rash.  Neurological: Positive for weakness. Negative for loss of consciousness and headaches.  Endo/Heme/Allergies: Negative for polydipsia.  Psychiatric/Behavioral: Positive for depression. Negative for suicidal ideas. The patient is nervous/anxious. The patient does not have insomnia.     Objective  BP 100/60  Pulse 74  Temp(Src) 98.3 F (36.8 C) (Oral)  Ht 5' (1.524 m)  Wt 97 lb 6.4 oz (44.18 kg)  BMI 19.02 kg/m2  SpO2 94%  Physical Exam  Physical Exam  Constitutional: She is oriented to person, place, and time. No distress.  frail  HENT:  Head: Normocephalic and atraumatic.  Eyes: Conjunctivae are normal.  Neck: Neck supple. No thyromegaly present.  Cardiovascular: Normal rate, regular rhythm and normal heart sounds.   Pulmonary/Chest: Effort normal and breath sounds normal. She has no wheezes.  Abdominal: She exhibits no distension and no mass.  Musculoskeletal: She exhibits no edema.  Lymphadenopathy:    She has no cervical adenopathy.  Neurological: She is alert and oriented to person, place, and time.  Skin: Skin is warm and dry. No rash noted. She is not diaphoretic.  Psychiatric: Memory, affect and judgment normal.    Lab Results  Component Value Date   TSH 0.446 09/04/2012   Lab Results  Component Value Date   WBC 13.7* 10/30/2012   HGB 9.3* 10/30/2012   HCT 28.1* 10/30/2012   MCV 105.2* 10/30/2012    PLT 182 10/30/2012   Lab Results  Component Value Date   CREATININE 1.52* 09/04/2012   BUN 25* 09/04/2012   NA 142 09/04/2012   K 4.7 09/04/2012   CL 100 09/04/2012   CO2 28 09/04/2012   Lab Results  Component Value Date   ALT 12 09/04/2012   AST 16 09/04/2012   ALKPHOS 50 09/04/2012   BILITOT 0.5 09/04/2012   Lab Results  Component Value Date   CHOL 126 09/04/2012   Lab Results  Component Value Date   HDL 46 09/04/2012   Lab Results  Component Value Date   LDLCALC 43 09/04/2012   Lab Results  Component Value Date   TRIG 184* 09/04/2012   Lab  Results  Component Value Date   CHOLHDL 2.7 09/04/2012     Assessment & Plan  HYPERTENSION, UNSPECIFIED Well controlled no changes  VENTRICULAR TACHYCARDIA Well controlled today  Diabetes mellitus Check hgba1c today  Macrocytic anemia Given Vitamin b12 shot today then started on SL and IF,vitamin b12 levels checked  Recurrent UTI Just finished a  Course of antibiotic from her urologist while she is here today will recheck Urine  Anxiety and depression She agrees depression is worsening, will increase Paroxetine to 20 mg daily and reassess at next visit  Loss of weight Mild loss encouraged increased protein intake. We will reevaluate in 3-4 weeks  Falls Recent fall in [place] resulted in a subdural hematoma which did not require surgical correction. She then had to spend a short time in a skilled facility. She is now back home with a part-time care provider. Falls are likely multifactorial. Will monitor  HYPOTHYROIDISM Check tsh today

## 2012-11-07 NOTE — Patient Instructions (Addendum)
Pernicious Anemia Pernicious anemia may be an immune system illness. It causes the production of antibodies to cells of the stomach (parietal cells), and proteins produced by the stomach, which are needed to absorb vitamin B12. The result of this illness is the body does not absorb enough B12 from the diet. This leads to lessened red blood cell production which causes anemia. Vitamin B12 is needed for making red blood cells and keeping the nervous system healthy. Not enough vitamin B12 in your system slowly affects sensory and motor nerves. This causes problems with your nervous system (neurological) to develop over time. Neurological effects of vitamin B12 deficiency may be seen before anemia is diagnosed. This affects both men and women, between ages 26 and 15. The anemia also affects the bowel, the heart and vascular systems and cannot be prevented. CAUSES  Pernicious anemia is due to a lack of substance called intrinsic factor. This is a substance made by cells in the stomach. It makes it possible to absorb vitamin B12. The reason for the lack of this substance is unknown but it may be autoimmune, genetic, or both. SYMPTOMS  The following problems may be seen with this illness:  Problems develop slowly.  Rapid heart rate.  Nausea, appetite loss, and weight loss.  Difficulty maintaining proper balance.  Yellow eyes and skin.  Loss of deep tendon reflexes.  Depression.  Confusion, poor memory, and dementia.  Ringing in the ears (tinnitus).  Weakness, especially in the arms and legs.  Sore tongue.  Numbness or tingling in the hands and feet.  Pale lips, tongue, and gums.  Bleeding gums.  Shortness of breath.  Headache.  Fatigue. RISK OF VITAMIN B12 DEFICIENCY INCREASES WITH:  Diseases or surgery affecting the stomach.  Diabetes and autoimmune disorders. Autoimmune disorders are diseases where the body makes antibodies which attack your own body tissues.  Thyroid  disorders.  Genetic factors, such as in people of Northern European ancestry. It is rare in African Americans and Asians.  Family history of pernicious anemia.  Age over 25.  Strict vegetarian diet or infants breast-fed by a mother on a strict vegetarian diet.  Lack of stomach acid in older adults.  Parasitic infections and intestinal diseases.  Drugs such as H2 blockers, proton pump inhibitors, colchicine, neomycin, and aminosalicylic acid.  Alcoholism. PREVENTION  Pernicious anemia cannot be prevented but vitamin B12 deficiencies can be prevented.  For pernicious anemia, lifelong vitamin B12 therapy will help symptoms and prevent complications.  Dietary changes can prevent deficiency. B12 is mostly from animal sources so a deficiency is more likely in a vegetarian who does not eat eggs or dairy products. RELATED COMPLICATIONS  Heart failure.  Nerve damage that can not be reversed.  Gastric cancer. DIAGNOSIS  Your caregiver can determine what is wrong by:  Doing blood tests for vitamin B12 levels.  Checking for antibodies to the intrinsic factor.  Measuring the body's ability to absorb vitamin B12. TREATMENT   Life long treatment usually involves vitamin B12 replacement. Monthly vitamin B12 injections are the treatment of choice to correct vitamin B12 deficiency and may be given by the patient. This therapy corrects the anemia and it may correct the neurological complications if given early enough. About 1% of vitamin B12 is absorbed (even in the absence of intrinsic factor) so some caregivers recommend that elderly patients with gastric atrophy take oral vitamin B12 supplements in addition to monthly injections.  Some symptoms should start to clear up in a few days after  treatment begins but other symptoms may take several months.  Additionally, other conditions which may lead to a deficiency should be treated.  Stop drinking alcohol if alcoholism led to the vitamin  B12 deficiency.  For other patients, the vitamin may be taken by mouth or as a nasal gel (or in addition to injections).  Iron supplements may be prescribed.  Avoid taking high amounts of folic acid. It can mask the signs of vitamin B12 deficiency.  Activity may be limited until symptoms improve.  Eat a well-balanced diet.  People on strict vegetarian diets can change their diet or take vitamin B12 supplements for life. PROGNOSIS   When caught early, the prognosis is good. Most people will do well.  Patients with this illness have a higher incidence of cancer and polyps of the stomach.  Nervous system problems may not improve if treatment does not start soon enough. Document Released: 07/24/2003 Document Revised: 07/26/2011 Document Reviewed: 04/30/2008 [facility] Patient Information 2014 [place], [place].

## 2012-11-07 NOTE — Assessment & Plan Note (Signed)
Given Vitamin b12 shot today then started on SL and IF,vitamin b12 levels checked

## 2012-11-07 NOTE — Assessment & Plan Note (Signed)
Mild loss encouraged increased protein intake. We will reevaluate in 3-4 weeks

## 2012-11-07 NOTE — Assessment & Plan Note (Signed)
Well controlled today.

## 2012-11-07 NOTE — Assessment & Plan Note (Signed)
Check tsh today 

## 2012-11-07 NOTE — Assessment & Plan Note (Signed)
She agrees depression is worsening, will increase Paroxetine to 20 mg daily and reassess at next visit

## 2012-11-07 NOTE — Assessment & Plan Note (Signed)
Check hgba1c today.  

## 2012-11-07 NOTE — Assessment & Plan Note (Signed)
Just finished a  Course of antibiotic from her urologist while she is here today will recheck Urine

## 2012-11-07 NOTE — Assessment & Plan Note (Signed)
Recent fall in [place] resulted in a subdural hematoma which did not require surgical correction. She then had to spend a short time in a skilled facility. She is now back home with a part-time care provider. Falls are likely multifactorial. Will monitor

## 2012-11-07 NOTE — Assessment & Plan Note (Signed)
Well controlled no changes 

## 2012-11-08 LAB — HEMOGLOBIN A1C
Hgb A1c MFr Bld: 6.2 % — ABNORMAL HIGH (ref <5.7)
Mean Plasma Glucose: 131 mg/dL — ABNORMAL HIGH (ref <117)

## 2012-11-09 ENCOUNTER — Telehealth: Payer: Self-pay | Admitting: Family Medicine

## 2012-11-09 ENCOUNTER — Telehealth: Payer: Self-pay | Admitting: *Deleted

## 2012-11-09 NOTE — Telephone Encounter (Signed)
Left a detailed message for [name] to return my call

## 2012-11-09 NOTE — Telephone Encounter (Signed)
I called the number (which is pts house) and pt nor daughter dont know why [name] called? I will wait for Adrienne with [name] to call me back. Previous note

## 2012-11-09 NOTE — Telephone Encounter (Signed)
Received message from Hastings (PT) at [facility]. She states pt's med list indicates pt should be taking amlodipine 10mg  daily but she does not have medication at home. Instead pt filled rx of Lisinopril 2.5mg  daily on 11/01/12. Which medication should pt be taking? If pt is to continue, she will need new rx sent to pharmacy. Please advise.

## 2012-11-09 NOTE — Telephone Encounter (Signed)
Caller: Melanie/Care Giver; Phone: [phone]; Reason for Call: Denied Triage.  Requests for Christy in office to call back.  If possible please call before 1pm.

## 2012-11-09 NOTE — Telephone Encounter (Signed)
So it depends on what she was taking when she was at her visit. We were under the impression she was still taking it when she was here. But if she was not she does not need it her bp and pulse were good at last visit. So if not taking, do not restart and remove from med list

## 2012-11-10 ENCOUNTER — Telehealth: Payer: Self-pay | Admitting: Dietician

## 2012-11-14 ENCOUNTER — Ambulatory Visit (INDEPENDENT_AMBULATORY_CARE_PROVIDER_SITE_OTHER): Payer: Medicare Other

## 2012-11-14 DIAGNOSIS — D510 Vitamin B12 deficiency anemia due to intrinsic factor deficiency: Secondary | ICD-10-CM | POA: Insufficient documentation

## 2012-11-14 MED ORDER — CYANOCOBALAMIN 1000 MCG/ML IJ SOLN
1000.0000 ug | Freq: Once | INTRAMUSCULAR | Status: AC
  Administered 2012-11-14: 1000 ug via INTRAMUSCULAR

## 2012-11-20 ENCOUNTER — Other Ambulatory Visit (HOSPITAL_BASED_OUTPATIENT_CLINIC_OR_DEPARTMENT_OTHER): Payer: Medicare Other

## 2012-11-20 ENCOUNTER — Ambulatory Visit (HOSPITAL_BASED_OUTPATIENT_CLINIC_OR_DEPARTMENT_OTHER): Payer: Medicare Other

## 2012-11-20 VITALS — BP 118/54 | HR 89 | Temp 97.7°F

## 2012-11-20 DIAGNOSIS — D469 Myelodysplastic syndrome, unspecified: Secondary | ICD-10-CM

## 2012-11-20 DIAGNOSIS — D649 Anemia, unspecified: Secondary | ICD-10-CM

## 2012-11-20 LAB — CBC WITH DIFFERENTIAL/PLATELET
Basophils Absolute: 0.1 10*3/uL (ref 0.0–0.1)
EOS%: 0.8 % (ref 0.0–7.0)
Eosinophils Absolute: 0.1 10*3/uL (ref 0.0–0.5)
HGB: 9.4 g/dL — ABNORMAL LOW (ref 11.6–15.9)
NEUT#: 12.2 10*3/uL — ABNORMAL HIGH (ref 1.5–6.5)
RDW: 15.4 % — ABNORMAL HIGH (ref 11.2–14.5)
WBC: 14.5 10*3/uL — ABNORMAL HIGH (ref 3.9–10.3)
lymph#: 1.1 10*3/uL (ref 0.9–3.3)

## 2012-11-20 MED ORDER — DARBEPOETIN ALFA-POLYSORBATE 300 MCG/0.6ML IJ SOLN
300.0000 ug | Freq: Once | INTRAMUSCULAR | Status: AC
  Administered 2012-11-20: 300 ug via SUBCUTANEOUS
  Filled 2012-11-20: qty 0.6

## 2012-11-21 ENCOUNTER — Ambulatory Visit (INDEPENDENT_AMBULATORY_CARE_PROVIDER_SITE_OTHER): Payer: Medicare Other | Admitting: Family Medicine

## 2012-11-21 ENCOUNTER — Ambulatory Visit: Payer: Medicare Other | Admitting: Family Medicine

## 2012-11-21 ENCOUNTER — Encounter: Payer: Self-pay | Admitting: Family Medicine

## 2012-11-21 VITALS — BP 122/68 | HR 97 | Temp 97.5°F | Ht 60.0 in | Wt 96.0 lb

## 2012-11-21 DIAGNOSIS — E538 Deficiency of other specified B group vitamins: Secondary | ICD-10-CM

## 2012-11-21 DIAGNOSIS — I10 Essential (primary) hypertension: Secondary | ICD-10-CM

## 2012-11-21 DIAGNOSIS — H109 Unspecified conjunctivitis: Secondary | ICD-10-CM

## 2012-11-21 DIAGNOSIS — D510 Vitamin B12 deficiency anemia due to intrinsic factor deficiency: Secondary | ICD-10-CM

## 2012-11-21 DIAGNOSIS — R634 Abnormal weight loss: Secondary | ICD-10-CM

## 2012-11-21 DIAGNOSIS — H029 Unspecified disorder of eyelid: Secondary | ICD-10-CM

## 2012-11-21 HISTORY — DX: Unspecified conjunctivitis: H10.9

## 2012-11-21 MED ORDER — BACITRACIN-POLYMYXIN B 500-10000 UNIT/GM OP OINT: TOPICAL_OINTMENT | Freq: Two times a day (BID) | OPHTHALMIC | Status: DC

## 2012-11-21 MED ORDER — CYANOCOBALAMIN 1000 MCG/ML IJ SOLN
1000.0000 ug | Freq: Once | INTRAMUSCULAR | Status: AC
  Administered 2012-11-21: 1000 ug via INTRAMUSCULAR

## 2012-11-21 MED ORDER — CYANOCOBALAMIN 1000 MCG/ML IJ SOLN: 1000.0000 ug | Freq: Once | INTRAMUSCULAR | Status: DC

## 2012-11-21 NOTE — Assessment & Plan Note (Signed)
With concerning lesion on upper eyelid, started on Polysporin today but lesion has been present over a month per patieint is referred back ot dermatologist for further consideration as well

## 2012-11-21 NOTE — Assessment & Plan Note (Signed)
Tolerating vitamin B12 shots, one given today

## 2012-11-21 NOTE — Assessment & Plan Note (Signed)
Continues to decline. Encouraged increased protein intake and her appetite appears to be rebounding, will monitor

## 2012-11-21 NOTE — Assessment & Plan Note (Signed)
Well controlled no changes 

## 2012-11-21 NOTE — Patient Instructions (Addendum)

## 2012-11-21 NOTE — Progress Notes (Signed)
Patient ID: [name], female   DOB: January 16, 1924, 79 y.o.   MRN: [id number] [name] [id number] 10-31-1923 11/21/2012      Progress Note-Follow Up  Subjective  Chief Complaint  Chief Complaint  Patient presents with  . Stye    left eye  . Injections    B12    HPI  Patient is an 69 we'll Caucasian female who is in today for followup. She was supposed to be having her blood pressure check in receiving a vitamin B12 injection but is having trouble with her left eye so status for appointment. She is noting for well over a month now some trouble with a lesion on her upper eyelid on the left that is itchy and she keeps picking at. She's also had some discharge from the eye but no pain or photophobia. No trauma. She has no other new complaints. Her fatigue maybe slightly better since starting iron B12. No recent fevers or chills. No significant increased congestion, headache or visual changes. Continues to lose weight but does think her appetite is slightly improved and she is being more consistently.  Past Medical History  Diagnosis Date  . CAD (coronary artery disease)     a. 10/2007 PCI Diag w/ 2.25 x 8mm Taxus Atom DES.;  b. 09/2011 Lexi MV: EF 69%, no ischemia/infarct.  Marland Kitchen PVD (peripheral vascular disease)   . Carotid artery stenosis   . HTN (hypertension)   . Hyperlipidemia   . Hypothyroidism   . CKD (chronic kidney disease), stage III   . Diabetes mellitus 07/16/2011  . Allergic state 07/16/2011  . Insomnia 07/16/2011  . Recurrent UTI 07/16/2011  . Asthma     childhood, recurrent at 25  . Myelodysplastic syndrome 04/02/2011  . Sinusitis 07/18/2011  . Anemia 07/18/2011  . Arthritis 07/18/2011  . Cataract extraction status of right eye 08/18/2011  . Anxiety and depression 08/18/2011  . Bronchitis, acute 12/20/2011  . Headache(784.0) 03/02/2012  . AV heart block     a. 10/2007 MDT Versa dual chamber PPM.  . Bladder cancer 12/2002  . Closed intertrochanteric fracture of left hip 05/27/2012   . Loss of weight 09/04/2012  . Renal failure, chronic 09/05/2012  . Conjunctivitis unspecified 11/21/2012    Past Surgical History  Procedure Laterality Date  . Cholecystectomy    . Pcm - medtronic    . Abdominal hysterectomy    . Shoulder open rotator cuff repair    . Foot surgery      bunionectomy  . Intramedullary (im) nail intertrochanteric  05/27/2012    Procedure: INTRAMEDULLARY (IM) NAIL INTERTROCHANTRIC;  Surgeon: Eulas Post, MD;  Location: [facility] OR;  Service: Orthopedics;  Laterality: Left;    Family History  Problem Relation Age of Onset  . Heart disease Mother     chf  . Arthritis Mother   . Arthritis Father   . Kidney disease Father   . Hypertension Father   . Cancer Brother     lung  . Arthritis Daughter     MVA with very serious injuries    History   Social History  . Marital Status: Widowed    Spouse Name: N/A    Number of Children: N/A  . Years of Education: N/A   Occupational History  . Not on file.   Social History Main Topics  . Smoking status: Never Smoker   . Smokeless tobacco: Never Used  . Alcohol Use: No  . Drug Use: No  . Sexually  Active: No   Other Topics Concern  . Not on file   Social History Narrative  . No narrative on file    Current Outpatient Prescriptions on File Prior to Visit  Medication Sig Dispense Refill  . acetaminophen (TYLENOL) 500 MG tablet Take 500 mg by mouth daily as needed. For pain      . amLODipine (NORVASC) 5 MG tablet Take 10 mg by mouth daily.      Marland Kitchen levothyroxine (SYNTHROID, LEVOTHROID) 112 MCG tablet Take 1 tablet (112 mcg total) by mouth daily.  30 tablet  1  . PARoxetine (PAXIL) 20 MG tablet Take 1 tablet (20 mg total) by mouth every morning.  30 tablet  2  . rosuvastatin (CRESTOR) 20 MG tablet Take 1 tablet (20 mg total) by mouth at bedtime.  30 tablet  1  . vitamin B-12 (CYANOCOBALAMIN) 1000 MCG tablet Take 1 tablet (1,000 mcg total) by mouth daily. Prefer a sublingual preparation  30 tablet  2    No current facility-administered medications on file prior to visit.    Allergies  Allergen Reactions  . Codeine Anaphylaxis and Swelling  . Penicillins Anaphylaxis and Swelling  . Sulfonamide Derivatives Anaphylaxis and Swelling    Review of Systems  Review of Systems  Constitutional: Positive for malaise/fatigue. Negative for fever.  HENT: Negative for congestion.   Eyes: Positive for discharge and redness. Negative for pain.  Respiratory: Negative for shortness of breath.   Cardiovascular: Negative for chest pain, palpitations and leg swelling.  Gastrointestinal: Negative for nausea, abdominal pain and diarrhea.  Genitourinary: Negative for dysuria.  Musculoskeletal: Negative for falls.  Skin: Negative for rash.  Neurological: Negative for loss of consciousness and headaches.  Endo/Heme/Allergies: Negative for polydipsia.  Psychiatric/Behavioral: Negative for depression and suicidal ideas. The patient is not nervous/anxious and does not have insomnia.     Objective  BP 122/68  Pulse 97  Temp(Src) 97.5 F (36.4 C) (Oral)  Ht 5' (1.524 m)  Wt 96 lb (43.545 kg)  BMI 18.75 kg/m2  Physical Exam  Physical Exam  Constitutional: She is oriented to person, place, and time and well-developed, well-nourished, and in no distress. No distress.  HENT:  Head: Normocephalic and atraumatic.  Eyes: EOM are normal. Pupils are equal, round, and reactive to light. Right eye exhibits no discharge. Left eye exhibits discharge. No scleral icterus.  Conjunctiva erythematous on left with green discharge noted. 1 cm raised, scaly lesion on left upper eyelid  Neck: Neck supple. No thyromegaly present.  Cardiovascular: Normal rate, regular rhythm and normal heart sounds.   No murmur heard. Pulmonary/Chest: Effort normal and breath sounds normal. She has no wheezes.  Abdominal: She exhibits no distension and no mass.  Musculoskeletal: She exhibits no edema.  Lymphadenopathy:    She has  no cervical adenopathy.  Neurological: She is alert and oriented to person, place, and time.  Skin: Skin is warm and dry. No rash noted. She is not diaphoretic.  Psychiatric: Memory, affect and judgment normal.    Lab Results  Component Value Date   TSH 0.374 11/07/2012   Lab Results  Component Value Date   WBC 14.5* 11/20/2012   HGB 9.4* 11/20/2012   HCT 28.4* 11/20/2012   MCV 103.7* 11/20/2012   PLT 187 11/20/2012   Lab Results  Component Value Date   CREATININE 1.07 11/07/2012   BUN 19 11/07/2012   NA 141 11/07/2012   K 4.1 11/07/2012   CL 98 11/07/2012   CO2 30 11/07/2012  Lab Results  Component Value Date   ALT 16 11/07/2012   AST 19 11/07/2012   ALKPHOS 61 11/07/2012   BILITOT 0.5 11/07/2012   Lab Results  Component Value Date   CHOL 126 09/04/2012   Lab Results  Component Value Date   HDL 46 09/04/2012   Lab Results  Component Value Date   LDLCALC 43 09/04/2012   Lab Results  Component Value Date   TRIG 184* 09/04/2012   Lab Results  Component Value Date   CHOLHDL 2.7 09/04/2012     Assessment & Plan  Pernicious anemia Tolerating vitamin B12 shots, one given today  HYPERTENSION, UNSPECIFIED Well controlled no changes  Conjunctivitis unspecified With concerning lesion on upper eyelid, started on Polysporin today but lesion has been present over a month per patieint is referred back ot dermatologist for further consideration as well  Loss of weight Continues to decline. Encouraged increased protein intake and her appetite appears to be rebounding, will monitor

## 2012-11-27 ENCOUNTER — Ambulatory Visit (INDEPENDENT_AMBULATORY_CARE_PROVIDER_SITE_OTHER): Payer: Medicare Other

## 2012-11-27 ENCOUNTER — Telehealth: Payer: Self-pay

## 2012-11-27 DIAGNOSIS — E538 Deficiency of other specified B group vitamins: Secondary | ICD-10-CM

## 2012-11-27 MED ORDER — CYANOCOBALAMIN 1000 MCG/ML IJ SOLN
1000.0000 ug | Freq: Once | INTRAMUSCULAR | Status: AC
  Administered 2012-11-27: 1000 ug via INTRAMUSCULAR

## 2012-11-27 NOTE — Telephone Encounter (Signed)
So now she can drop her Vitamin B12 shots to q 2 weeks x 2 shots then drop to 1 shot monthly

## 2012-11-27 NOTE — Telephone Encounter (Signed)
Pt informed and appts scheduled.

## 2012-11-27 NOTE — Telephone Encounter (Signed)
Pt came in today for her weekly B12 injection. Does pt need to continue with weekly B12? Please advise?

## 2012-11-27 NOTE — Progress Notes (Signed)
  Subjective:    Patient ID: [name], female    DOB: 1923-10-27, 79 y.o.   MRN: [id number]  HPI    Review of Systems     Objective:   Physical Exam        Assessment & Plan:  Pt came in today for her B12 injection. Pt tolerated injection well

## 2012-11-28 ENCOUNTER — Ambulatory Visit: Payer: Medicare Other | Admitting: Family Medicine

## 2012-11-29 ENCOUNTER — Telehealth: Payer: Self-pay

## 2012-11-29 NOTE — Telephone Encounter (Signed)
OK to give verbal order for continued OT and have her drop her Amlodipine to 2.5 mg daily

## 2012-11-29 NOTE — Telephone Encounter (Signed)
Pt was getting ready to get discharged but just got put back on antibiotics for a UTI and is afraid of declining. Rosanne Ashing would like to continue OT 1 time a week for 3 weeks beginning 12-04-12. Also he stated in vm that pts BP is running low 98/62 last week and 98 56 today?   Please advise both?

## 2012-11-29 NOTE — Telephone Encounter (Signed)
I left a detailed message on Jim's vm and called pt to inform her about the Amlodipine and she states she doesn't have Amlodipine that she is taking Lisinopril 2.5 mg? Please advise?

## 2012-11-29 NOTE — Telephone Encounter (Signed)
Her chart says Amlodipine. If she is only takng Lisinopril then she should stop it.

## 2012-11-30 NOTE — Telephone Encounter (Signed)
Pt informed and voiced understanding. I removed Amlodipine

## 2012-12-03 ENCOUNTER — Emergency Department (HOSPITAL_BASED_OUTPATIENT_CLINIC_OR_DEPARTMENT_OTHER): Payer: Medicare Other

## 2012-12-03 ENCOUNTER — Encounter (HOSPITAL_BASED_OUTPATIENT_CLINIC_OR_DEPARTMENT_OTHER): Payer: Self-pay | Admitting: *Deleted

## 2012-12-03 ENCOUNTER — Observation Stay (HOSPITAL_BASED_OUTPATIENT_CLINIC_OR_DEPARTMENT_OTHER)
Admission: EM | Admit: 2012-12-03 | Discharge: 2012-12-06 | Disposition: A | Discharge status: Home / Self Care | Payer: Medicare Other | Attending: Internal Medicine | Admitting: Internal Medicine

## 2012-12-03 DIAGNOSIS — R296 Repeated falls: Secondary | ICD-10-CM

## 2012-12-03 DIAGNOSIS — D469 Myelodysplastic syndrome, unspecified: Secondary | ICD-10-CM

## 2012-12-03 DIAGNOSIS — F419 Anxiety disorder, unspecified: Secondary | ICD-10-CM

## 2012-12-03 DIAGNOSIS — J329 Chronic sinusitis, unspecified: Secondary | ICD-10-CM

## 2012-12-03 DIAGNOSIS — I2510 Atherosclerotic heart disease of native coronary artery without angina pectoris: Secondary | ICD-10-CM

## 2012-12-03 DIAGNOSIS — I442 Atrioventricular block, complete: Secondary | ICD-10-CM

## 2012-12-03 DIAGNOSIS — H109 Unspecified conjunctivitis: Secondary | ICD-10-CM

## 2012-12-03 DIAGNOSIS — R079 Chest pain, unspecified: Principal | ICD-10-CM

## 2012-12-03 DIAGNOSIS — D72829 Elevated white blood cell count, unspecified: Secondary | ICD-10-CM

## 2012-12-03 DIAGNOSIS — N390 Urinary tract infection, site not specified: Secondary | ICD-10-CM

## 2012-12-03 DIAGNOSIS — R42 Dizziness and giddiness: Secondary | ICD-10-CM

## 2012-12-03 DIAGNOSIS — E119 Type 2 diabetes mellitus without complications: Secondary | ICD-10-CM

## 2012-12-03 DIAGNOSIS — Z950 Presence of cardiac pacemaker: Secondary | ICD-10-CM

## 2012-12-03 DIAGNOSIS — W19XXXD Unspecified fall, subsequent encounter: Secondary | ICD-10-CM

## 2012-12-03 DIAGNOSIS — F329 Major depressive disorder, single episode, unspecified: Secondary | ICD-10-CM | POA: Diagnosis present

## 2012-12-03 DIAGNOSIS — L989 Disorder of the skin and subcutaneous tissue, unspecified: Secondary | ICD-10-CM

## 2012-12-03 DIAGNOSIS — I10 Essential (primary) hypertension: Secondary | ICD-10-CM

## 2012-12-03 DIAGNOSIS — I4729 Other ventricular tachycardia: Secondary | ICD-10-CM

## 2012-12-03 DIAGNOSIS — I739 Peripheral vascular disease, unspecified: Secondary | ICD-10-CM

## 2012-12-03 DIAGNOSIS — N189 Chronic kidney disease, unspecified: Secondary | ICD-10-CM

## 2012-12-03 DIAGNOSIS — I472 Ventricular tachycardia, unspecified: Secondary | ICD-10-CM

## 2012-12-03 DIAGNOSIS — D539 Nutritional anemia, unspecified: Secondary | ICD-10-CM

## 2012-12-03 DIAGNOSIS — E43 Unspecified severe protein-calorie malnutrition: Secondary | ICD-10-CM

## 2012-12-03 DIAGNOSIS — M1990 Unspecified osteoarthritis, unspecified site: Secondary | ICD-10-CM

## 2012-12-03 DIAGNOSIS — R51 Headache: Secondary | ICD-10-CM

## 2012-12-03 DIAGNOSIS — G4700 Insomnia, unspecified: Secondary | ICD-10-CM

## 2012-12-03 DIAGNOSIS — I951 Orthostatic hypotension: Secondary | ICD-10-CM

## 2012-12-03 DIAGNOSIS — R634 Abnormal weight loss: Secondary | ICD-10-CM

## 2012-12-03 DIAGNOSIS — E785 Hyperlipidemia, unspecified: Secondary | ICD-10-CM

## 2012-12-03 DIAGNOSIS — F341 Dysthymic disorder: Secondary | ICD-10-CM

## 2012-12-03 DIAGNOSIS — E782 Mixed hyperlipidemia: Secondary | ICD-10-CM | POA: Insufficient documentation

## 2012-12-03 DIAGNOSIS — W19XXXA Unspecified fall, initial encounter: Secondary | ICD-10-CM

## 2012-12-03 DIAGNOSIS — C679 Malignant neoplasm of bladder, unspecified: Secondary | ICD-10-CM

## 2012-12-03 DIAGNOSIS — R0789 Other chest pain: Secondary | ICD-10-CM

## 2012-12-03 DIAGNOSIS — E039 Hypothyroidism, unspecified: Secondary | ICD-10-CM

## 2012-12-03 DIAGNOSIS — D510 Vitamin B12 deficiency anemia due to intrinsic factor deficiency: Secondary | ICD-10-CM

## 2012-12-03 HISTORY — DX: Repeated falls: R29.6

## 2012-12-03 LAB — URINALYSIS, ROUTINE W REFLEX MICROSCOPIC
Bilirubin Urine: NEGATIVE
Glucose, UA: 100 mg/dL — AB
Specific Gravity, Urine: 1.014 (ref 1.005–1.030)

## 2012-12-03 LAB — COMPREHENSIVE METABOLIC PANEL
ALT: 24 U/L (ref 0–35)
Albumin: 3 g/dL — ABNORMAL LOW (ref 3.5–5.2)
Alkaline Phosphatase: 58 U/L (ref 39–117)
Chloride: 101 mEq/L (ref 96–112)
Glucose, Bld: 256 mg/dL — ABNORMAL HIGH (ref 70–99)
Potassium: 3.9 mEq/L (ref 3.5–5.1)
Sodium: 140 mEq/L (ref 135–145)
Total Bilirubin: 0.4 mg/dL (ref 0.3–1.2)
Total Protein: 7.1 g/dL (ref 6.0–8.3)

## 2012-12-03 LAB — LIPID PANEL
HDL: 32 mg/dL — ABNORMAL LOW (ref >39)
LDL Cholesterol: 23 mg/dL (ref 0–99)
Total CHOL/HDL Ratio: 2.9 RATIO
Triglycerides: 194 mg/dL — ABNORMAL HIGH (ref <150)

## 2012-12-03 LAB — CBC WITH DIFFERENTIAL/PLATELET
Basophils Relative: 0 % (ref 0–1)
HCT: 32.6 % — ABNORMAL LOW (ref 36.0–46.0)
Hemoglobin: 10 g/dL — ABNORMAL LOW (ref 12.0–15.0)
Lymphocytes Relative: 4 % — ABNORMAL LOW (ref 12–46)
MCHC: 30.7 g/dL (ref 30.0–36.0)
Monocytes Absolute: 1.2 10*3/uL — ABNORMAL HIGH (ref 0.1–1.0)
Monocytes Relative: 7 % (ref 3–12)
Neutro Abs: 13.9 10*3/uL — ABNORMAL HIGH (ref 1.7–7.7)
Neutrophils Relative %: 87 % — ABNORMAL HIGH (ref 43–77)
RBC: 3.06 MIL/uL — ABNORMAL LOW (ref 3.87–5.11)
WBC: 16 10*3/uL — ABNORMAL HIGH (ref 4.0–10.5)

## 2012-12-03 LAB — URINE MICROSCOPIC-ADD ON

## 2012-12-03 LAB — TROPONIN I: Troponin I: <0.3 ng/mL (ref <0.30)

## 2012-12-03 LAB — MRSA PCR SCREENING: MRSA by PCR: NEGATIVE

## 2012-12-03 MED ORDER — ASPIRIN EC 325 MG PO TBEC
325.0000 mg | DELAYED_RELEASE_TABLET | Freq: Every day | ORAL | Status: DC
Start: 2012-12-03 — End: 2012-12-06
  Administered 2012-12-04 – 2012-12-06 (×3): 325 mg via ORAL
  Filled 2012-12-03 (×4): qty 1

## 2012-12-03 MED ORDER — ASPIRIN 81 MG PO CHEW
324.0000 mg | CHEWABLE_TABLET | Freq: Once | ORAL | Status: AC
  Administered 2012-12-03: 324 mg via ORAL
  Filled 2012-12-03: qty 4

## 2012-12-03 MED ORDER — PANTOPRAZOLE SODIUM 40 MG PO TBEC
40.0000 mg | DELAYED_RELEASE_TABLET | Freq: Every day | ORAL | Status: DC
  Administered 2012-12-03 – 2012-12-06 (×4): 40 mg via ORAL
  Filled 2012-12-03 (×4): qty 1

## 2012-12-03 MED ORDER — ACETAMINOPHEN 325 MG PO TABS
650.0000 mg | ORAL_TABLET | Freq: Four times a day (QID) | ORAL | Status: DC | PRN
  Administered 2012-12-04: 650 mg via ORAL
  Filled 2012-12-03: qty 2

## 2012-12-03 MED ORDER — NITROGLYCERIN 2 % TD OINT
0.5000 [in_us] | TOPICAL_OINTMENT | Freq: Four times a day (QID) | TRANSDERMAL | Status: DC
  Administered 2012-12-03 – 2012-12-05 (×7): 0.5 [in_us] via TOPICAL
  Filled 2012-12-03: qty 30

## 2012-12-03 MED ORDER — CIPROFLOXACIN HCL 250 MG PO TABS
250.0000 mg | ORAL_TABLET | ORAL | Status: DC
  Administered 2012-12-03 – 2012-12-05 (×3): 250 mg via ORAL
  Filled 2012-12-03 (×4): qty 1

## 2012-12-03 MED ORDER — VITAMIN B-12 1000 MCG PO TABS
1000.0000 ug | ORAL_TABLET | Freq: Every day | ORAL | Status: DC
Start: 2012-12-03 — End: 2012-12-06
  Administered 2012-12-03 – 2012-12-06 (×4): 1000 ug via ORAL
  Filled 2012-12-03 (×4): qty 1

## 2012-12-03 MED ORDER — SODIUM CHLORIDE 0.9 % IJ SOLN
3.0000 mL | Freq: Two times a day (BID) | INTRAMUSCULAR | Status: DC
  Administered 2012-12-03: 20:00:00 via INTRAVENOUS
  Administered 2012-12-04 – 2012-12-06 (×5): 3 mL via INTRAVENOUS

## 2012-12-03 MED ORDER — ENOXAPARIN SODIUM 30 MG/0.3ML ~~LOC~~ SOLN
20.0000 mg | SUBCUTANEOUS | Status: DC
  Administered 2012-12-03: 20 mg via SUBCUTANEOUS
  Filled 2012-12-03 (×2): qty 0.2

## 2012-12-03 MED ORDER — ALUM & MAG HYDROXIDE-SIMETH 200-200-20 MG/5ML PO SUSP: 15.0000 mL | ORAL | Status: DC | PRN

## 2012-12-03 MED ORDER — PAROXETINE HCL 20 MG PO TABS
20.0000 mg | ORAL_TABLET | Freq: Every day | ORAL | Status: DC
  Administered 2012-12-04 – 2012-12-06 (×3): 20 mg via ORAL
  Filled 2012-12-03 (×3): qty 1

## 2012-12-03 MED ORDER — INSULIN ASPART 100 UNIT/ML ~~LOC~~ SOLN
0.0000 [IU] | Freq: Three times a day (TID) | SUBCUTANEOUS | Status: DC
  Administered 2012-12-04: 2 [IU] via SUBCUTANEOUS
  Administered 2012-12-04: 1 [IU] via SUBCUTANEOUS
  Administered 2012-12-05: 2 [IU] via SUBCUTANEOUS
  Administered 2012-12-05 (×2): 1 [IU] via SUBCUTANEOUS

## 2012-12-03 MED ORDER — NITROGLYCERIN 0.4 MG SL SUBL
0.4000 mg | SUBLINGUAL_TABLET | SUBLINGUAL | Status: DC | PRN
  Filled 2012-12-03: qty 25

## 2012-12-03 MED ORDER — IOHEXOL 350 MG/ML SOLN
100.0000 mL | Freq: Once | INTRAVENOUS | Status: AC | PRN
  Administered 2012-12-03: 100 mL via INTRAVENOUS

## 2012-12-03 MED ORDER — SENNOSIDES-DOCUSATE SODIUM 8.6-50 MG PO TABS
2.0000 | ORAL_TABLET | Freq: Every evening | ORAL | Status: DC | PRN
  Filled 2012-12-03: qty 2

## 2012-12-03 MED ORDER — FENTANYL CITRATE 0.05 MG/ML IJ SOLN: 12.5000 ug | INTRAMUSCULAR | Status: DC | PRN

## 2012-12-03 MED ORDER — HEPARIN SODIUM (PORCINE) 5000 UNIT/ML IJ SOLN: 5000.0000 [IU] | Freq: Three times a day (TID) | INTRAMUSCULAR | Status: DC

## 2012-12-03 MED ORDER — BACITRACIN-POLYMYXIN B 500-10000 UNIT/GM OP OINT
TOPICAL_OINTMENT | Freq: Two times a day (BID) | OPHTHALMIC | Status: DC
  Administered 2012-12-03 – 2012-12-06 (×6): via OPHTHALMIC
  Filled 2012-12-03: qty 3.5

## 2012-12-03 MED ORDER — PNEUMOCOCCAL VAC POLYVALENT 25 MCG/0.5ML IJ INJ
0.5000 mL | INJECTION | INTRAMUSCULAR | Status: AC
  Filled 2012-12-03: qty 0.5

## 2012-12-03 MED ORDER — SODIUM CHLORIDE 0.9 % IV SOLN
INTRAVENOUS | Status: DC
  Administered 2012-12-03: 10:00:00 via INTRAVENOUS

## 2012-12-03 MED ORDER — CIPROFLOXACIN IN D5W 400 MG/200ML IV SOLN
400.0000 mg | Freq: Once | INTRAVENOUS | Status: AC
  Administered 2012-12-03: 400 mg via INTRAVENOUS
  Filled 2012-12-03: qty 200

## 2012-12-03 MED ORDER — SODIUM CHLORIDE 0.9 % IV BOLUS (SEPSIS)
250.0000 mL | Freq: Once | INTRAVENOUS | Status: AC
  Administered 2012-12-03: 250 mL via INTRAVENOUS

## 2012-12-03 MED ORDER — ENOXAPARIN SODIUM 30 MG/0.3ML ~~LOC~~ SOLN
30.0000 mg | SUBCUTANEOUS | Status: DC
  Filled 2012-12-03: qty 0.3

## 2012-12-03 MED ORDER — LEVOTHYROXINE SODIUM 112 MCG PO TABS
112.0000 ug | ORAL_TABLET | Freq: Every day | ORAL | Status: DC
  Administered 2012-12-04 – 2012-12-06 (×3): 112 ug via ORAL
  Filled 2012-12-03 (×5): qty 1

## 2012-12-03 MED ORDER — ATORVASTATIN CALCIUM 40 MG PO TABS
40.0000 mg | ORAL_TABLET | Freq: Every day | ORAL | Status: DC
  Administered 2012-12-03 – 2012-12-05 (×3): 40 mg via ORAL
  Filled 2012-12-03 (×4): qty 1

## 2012-12-03 MED ORDER — FUROSEMIDE 10 MG/ML IJ SOLN
40.0000 mg | Freq: Once | INTRAMUSCULAR | Status: AC
  Administered 2012-12-03: 40 mg via INTRAVENOUS
  Filled 2012-12-03: qty 4

## 2012-12-03 NOTE — ED Notes (Signed)
MD at bedside. 

## 2012-12-03 NOTE — ED Notes (Addendum)
Patient's Son, Santia Labate cell phone number [phone]

## 2012-12-03 NOTE — Progress Notes (Addendum)
79year old female with hx of HTN, HLP, pacer, hypothyroidism, cad with stent placement in 2009 Presenting for chest pain since AM, first troponin negative. CT angio neg for PE. Pacemaker for Mobitz type II. EKG atrial paced. Cardiology called but Dr Shirlee Latch wanted hospitalist to admit.    Patient refuses nitro. Still having ongoing chest pain 6/10.  Accepted to team 10, stepdown, consult [facility] cardiology when patient arrives.   Christpher Stogsdill M.D. [facility] 12/03/2012, 12:27 PM  Pager: [phone]

## 2012-12-03 NOTE — H&P (Signed)
[facility] History and Physical  [name] WUJ:[id number] DOB: Aug 25, 1923 DOA: 12/03/2012  Referring physician:  Vanetta Mulders PCP:  Danise Edge, MD   Chief Complaint:  Chest pressure  HPI:  The patient is a 79 y.o. year-old female with history of hypertension, hyperlipidemia, CAD status post cardiac catheterization in June of 2009 with a drug-eluting stent to the ostium of diagonal, negative stress test in May of 2013, history of symptomatic bradycardia with Mobitz 2 AV block status post Medtronic dual lead permanent pacemaker, followed by Dr. Graciela Husbands. She also has history of interstitial pulmonary fibrosis, myelodysplasia with chronic anemia, chronic kidney disease stage III, diet-controlled diabetes, hypothyroidism, acid reflux with history of gastric erosions, bladder cancer.    She was at her baseline health proximally month ago when she had a fall resulting in injury to her neck.  She has been less mobile for the last month as a result. For the last 2 weeks she has had progressive shortness of breath, increased fatigue, and mild cough over the last 2 days. She denies any chest pains or shortness of breath with exertion, but cannot ambulate far. This morning around 3 AM she awoke with 10 out of 10 substernal chest pressure. "I felt like someone was standing on my chest." The pain was associated with shortness of breath, clamminess, mild nausea. She has baseline lightheadedness. She had the pain for several hours before she called her son for assistance. [facility] presented [facility] with ongoing chest pain.   In the urgent care, she was given aspirin 324 mg, nitroglycerin, normal saline, oxygen and her chest pain was somewhat reduced from 10 to 4.   Her first troponin was negative, her EKG demonstrates an atrial paced rhythm however the axis has changed, chest x-ray demonstrated interstitial edema, and CT angiogram of the chest demonstrated interstitial pulmonary fibrosis  without evidence of pulmonary embolism.  She was transferred to [facility] for ongoing chest pain.  Review of Systems:  Denies fevers, chills.  She has had several pounds of weight loss over the last month, denies changes to hearing and vision.  Denies rhinorrhea, sinus congestion, sore throat.   Denies vomiting, constipation, diarrhea.  Recently started on antibiotics for urinary tract infection..  Denies hematemesis, blood in stools, melena, abnormal bruising or bleeding.  Denies lymphadenopathy.  Denies arthralgias, myalgias.  Exophytic crusted.  Denies lower extremity edema.  Denies focal numbness, weakness, slurred speech, confusion, facial droop.  Denies anxiety and depression.    Past Medical History  Diagnosis Date  . CAD (coronary artery disease)     a. 10/2007 PCI Diag w/ 2.25 x 8mm Taxus Atom DES.;  b. 09/2011 Lexi MV: EF 69%, no ischemia/infarct.  Marland Kitchen PVD (peripheral vascular disease)   . Carotid artery stenosis     Dr. Graciela Husbands  . HTN (hypertension)   . Hyperlipidemia   . Hypothyroidism   . CKD (chronic kidney disease), stage III   . Diabetes mellitus 07/16/2011  . Allergic state 07/16/2011  . Insomnia 07/16/2011  . Recurrent UTI 07/16/2011  . Asthma     childhood, recurrent at 18  . Myelodysplastic syndrome 04/02/2011  . Sinusitis 07/18/2011  . Anemia 07/18/2011  . Arthritis 07/18/2011  . Cataract extraction status of right eye 08/18/2011  . Anxiety and depression 08/18/2011  . Bronchitis, acute 12/20/2011  . Headache(784.0) 03/02/2012  . AV heart block     a. 10/2007 MDT Versa dual chamber PPM.  . Bladder cancer 12/2002  .  Closed intertrochanteric fracture of left hip 05/27/2012  . Loss of weight 09/04/2012  . Renal failure, chronic 09/05/2012  . Conjunctivitis unspecified 11/21/2012  . Frequent falls    Past Surgical History  Procedure Laterality Date  . Cholecystectomy    . Pcm - medtronic    . Abdominal hysterectomy    . Shoulder open rotator cuff repair    . Foot surgery       bunionectomy  . Intramedullary (im) nail intertrochanteric  05/27/2012    Procedure: INTRAMEDULLARY (IM) NAIL INTERTROCHANTRIC;  Surgeon: Eulas Post, MD;  Location: [facility] OR;  Service: Orthopedics;  Laterality: Left;  . Pacemaker insertion     Social History:  reports that she has never smoked. She has never used smokeless tobacco. She reports that she does not drink alcohol or use illicit drugs. Lives at home and uses a walker and has caretaker who comes almost every day.    Allergies  Allergen Reactions  . Codeine Anaphylaxis and Swelling  . Penicillins Anaphylaxis and Swelling  . Sulfonamide Derivatives Anaphylaxis and Swelling    Family History  Problem Relation Age of Onset  . Heart disease Mother     chf  . Arthritis Mother   . Arthritis Father   . Kidney disease Father   . Hypertension Father   . Cancer Brother     lung  . Arthritis Daughter     MVA with very serious injuries     Prior to Admission medications   Medication Sig Start Date End Date Taking? Authorizing Provider  aspirin EC 81 MG tablet Take 81 mg by mouth daily.   Yes Historical Provider, MD  acetaminophen (TYLENOL) 500 MG tablet Take 500 mg by mouth daily as needed. For pain    Historical Provider, MD  bacitracin-polymyxin b (POLYSPORIN) ophthalmic ointment Place into the left eye every 12 (twelve) hours. apply to eye every 12 hours while awake 11/21/12   Bradd Canary, MD  levothyroxine (SYNTHROID, LEVOTHROID) 112 MCG tablet Take 1 tablet (112 mcg total) by mouth daily. 06/09/12   Mcarthur Rossetti Angiulli, PA-C  PARoxetine (PAXIL) 20 MG tablet Take 1 tablet (20 mg total) by mouth every morning. 11/07/12   Bradd Canary, MD  rosuvastatin (CRESTOR) 20 MG tablet Take 1 tablet (20 mg total) by mouth at bedtime. 06/09/12   Mcarthur Rossetti Angiulli, PA-C  vitamin B-12 (CYANOCOBALAMIN) 1000 MCG tablet Take 1 tablet (1,000 mcg total) by mouth daily. Prefer a sublingual preparation 11/07/12   Bradd Canary, MD   Physical  Exam: Filed Vitals:   12/03/12 0817 12/03/12 1019 12/03/12 1158 12/03/12 1444  BP: 167/73 135/61 122/57 120/65  Pulse: 73 78 74 76  Temp: 97.8 F (36.6 C)  98.6 F (37 C)   TempSrc: Oral  Oral   Resp: 20 20 14 16   Height:    5' (1.524 m)  Weight:    44.316 kg (97 lb 11.2 oz)  SpO2: 100% 99% 100% 95%     General:  Cachectic Caucasian female, in no acute distress  Eyes:  PERRL, anicteric, non-injected, left thigh with a 5 mm fungating mildly erythematous mass along the lash line  ENT:  Nares clear.  OP clear, non-erythematous without plaques or exudates.  MMM.  Neck:  Supple without TM or JVD.    Lymph:  No cervical, supraclavicular, or submandibular LAD.  Cardiovascular:  RRR, normal S1, S2, without m/r/g.  2+ pulses, warm extremities  Respiratory:  Dry rales to the apices  bilaterally, louder at the bases, no wheezes, no rhonchi, no increased WOB.  Abdomen:  NABS.  Soft, ND/NT.    Skin:  No rashes or focal lesions.  Musculoskeletal:  Normal bulk and tone.  No LE edema.  Psychiatric:  A & O x 4.  Appropriate affect.  Neurologic:  CN 3-12 intact.  5/5 strength.  Sensation intact.  Labs on Admission:  Basic Metabolic Panel:  Recent Labs Lab 12/03/12 0825  NA 140  K 3.9  CL 101  CO2 27  GLUCOSE 256*  BUN 19  CREATININE 1.20*  CALCIUM 9.7   Liver Function Tests:  Recent Labs Lab 12/03/12 0825  AST 31  ALT 24  ALKPHOS 58  BILITOT 0.4  PROT 7.1  ALBUMIN 3.0*   No results found for this basename: LIPASE, AMYLASE,  in the last 168 hours No results found for this basename: AMMONIA,  in the last 168 hours CBC:  Recent Labs Lab 12/03/12 0825  WBC 16.0*  NEUTROABS 13.9*  HGB 10.0*  HCT 32.6*  MCV 106.5*  PLT 161   Cardiac Enzymes:  Recent Labs Lab 12/03/12 0825 12/03/12 1153  TROPONINI <0.30 <0.30    BNP (last 3 results)  Recent Labs  04/20/12 1226 12/03/12 0825  PROBNP 113.0* 2120.0*   CBG: No results found for this basename:  GLUCAP,  in the last 168 hours  Radiological Exams on Admission: Dg Chest 2 View  12/03/2012   *RADIOLOGY REPORT*  Clinical Data: Jester Klingberg of breath, chest pain  CHEST - 2 VIEW  Comparison: Pain chest radiograph 09/04/2012  Findings: Left-sided pacemaker with two continuous leads overlies normal cardiac silhouette. There is mild interstitial edema pattern.  Underlying emphysematous change.  No focal infiltrate. No overt pulmonary edema.  IMPRESSION: 1.  Interstitial edema is increased. 2.  Emphysematous change.   Original Report Authenticated By: Genevive Bi, M.D.   Ct Angio Chest Pe W/cm &/or Wo Cm  12/03/2012   *RADIOLOGY REPORT*  Clinical Data: Chest pain.  CT ANGIOGRAPHY CHEST  Technique:  Multidetector CT imaging of the chest using the standard protocol during bolus administration of intravenous contrast. Multiplanar reconstructed images including MIPs were obtained and reviewed to evaluate the vascular anatomy.  Contrast: OMNIPAQUE IOHEXOL 350 MG/ML SOLN  Comparison: 12/10/2007  Findings: There is no pleural effusion.  Diffuse, peripheral and basilar predominant interstitial reticulation is identified.  There is mild subpleural honeycombing within the lung bases.  No airspace consolidation or atelectasis identified.  The heart size appears normal.  There is no pericardial effusion. The main pulmonary artery and its branches are patent.  There is no evidence for pulmonary embolus.  No enlarged mediastinal or hilar lymph nodes.  There is a left chest wall pacer device identified.  Leads are in the right atrial appendage and right ventricle.  No axillary or supraclavicular adenopathy.  There is a moderate-sized hiatal hernia.  No acute findings noted within the upper abdomen.  IMPRESSION:  1.  Exam negative for acute pulmonary embolus. 2.  Chronic interstitial lung disease.  Findings likely due to usual interstitial pneumonitis.   Original Report Authenticated By: Signa Kell, M.D.    EKG:  Independently reviewed. Atrially sensed, ventricularly paced rhythm, complete change of axis and the limb leads, however there do not appear to be any changes in leads V1 through V6.    Assessment/Plan Principal Problem:   Chest pain Active Problems:   HYPOTHYROIDISM   HYPERLIPIDEMIA-MIXED   HYPERTENSION, UNSPECIFIED   PVD   HYPOTENSION,  ORTHOSTATIC   Myelodysplastic syndrome   CAD   Diabetes mellitus   Recurrent UTI   Macrocytic anemia   Anxiety and depression   Falls   Conjunctivitis unspecified   Leukocytosis, unspecified   Skin lesion of hand   Chest pain:  Differential includes ACS in this patient with known CAD.  Differential also includes diastolic dysfunction with mild pulmonary edema, GERD.  No evidence of PNA, PTX on CXR or PE on CT angio. -  Stepdown -  Cycle troponins -  A1c -  Lipid panel -  Daily aspirin -  No beta blocker or ACEI due to history of hypotension, and currently low blood pressures -  Continue statin -  NTG paste 0.5" for now and if tolerated will increase to 1" -  Maalox prn -  Start protonix [facility] consulted, pt of Dr. Odessa Fleming  Interstitial pulmonary fibrosis, needs followup with pulmonology Hypertension, low blood pressures. Hyperlipidemia, lipid panel and continue statin Peripheral vascular disease, continue statin and aspirin Diabetes mellitus with hyperglycemia, A1c and start low-dose sliding scale insulin Chronic kidney disease stage III, creatinine at baseline 1.2, minimize nephrotoxins, renally dosed medications Myelodysplasia and vitamin B12 deficiency with resultant chronic anemia, continue vitamin B12 Anxiety and depression, chronic, stable, continue Paxil Conjunctivitis, resolving. Continue eyedrops Left eye stye, warm compresses Urinary tract infection, resume ciprofloxacin, renally dosed Skin lesion on the hand and on the face, recommends outpatient dermatology evaluation Frequent falls, placed on falls precautions  and PT evaluation Leukocytosis, possibly due to urinary tract infection or cardiac ischemia -  Trend white blood cell count -  Continue antibiotics for urinary tract infection  Diet:  Healthy heart Access:  PIV IVF:  Off Proph:  Lovenox  Code Status: Full code Family Communication: Spoke with the patient alone Disposition Plan: Continuing stepdown with ongoing chest pain and abnormal EKG  Time spent: 60 minutes  Yareth Kearse [facility] Pager [phone]  If 7PM-7AM, please contact night-coverage [email or web address] Password [facility] 12/03/2012, 5:34 PM

## 2012-12-03 NOTE — ED Notes (Signed)
Patient stated she started experiencing chest pain and SOB around 4am, feels like someone is standing on her chest, has pacemaker

## 2012-12-03 NOTE — ED Notes (Signed)
Report given to Ezequiel Kayser, RN [facility] Room 2919.

## 2012-12-03 NOTE — ED Notes (Signed)
Pt deferred Nitroglycerin (Nitrostat) 0.4mg  SL tablet. Reports gives her "bad headache and makes me really sick- felt like I was going to die" after taking it in the past. MD made aware.

## 2012-12-03 NOTE — Consult Note (Addendum)
Admit date: 12/03/2012 Referring Physician  Dr. Malachi Bonds Primary Physician  Dr. Abner Greenspan Primary Cardiologist  Dr. Ferd Glassing Reason for Consultation  CP  HPI: The patient is a 79 y.o. year-old female with history of hypertension, hyperlipidemia, CAD status post cardiac catheterization in June of 2009 with a drug-eluting stent to the ostium of diagonal, negative stress test in May of 2013, history of symptomatic bradycardia with Mobitz 2 AV block status post Medtronic dual lead permanent pacemaker, followed by Dr. Graciela Husbands. She also has history of interstitial pulmonary fibrosis, myelodysplasia with chronic anemia, chronic kidney disease stage III, diet-controlled diabetes, hypothyroidism, acid reflux with history of gastric erosions, bladder cancer.  She was at her baseline health proximally month ago when she had a fall resulting in injury to her neck. She has been less mobile for the last month as a result. For the last 2 weeks she has had progressive shortness of breath, increased fatigue, and mild cough over the last 2 days. She denies any chest pains or shortness of breath with exertion, but cannot ambulate far. This morning around 3 AM she awoke with 10 out of 10 substernal chest pressure. "I felt like someone was standing on my chest." The pain was associated with shortness of breath, clamminess, mild nausea. She has baseline lightheadedness. She had the pain for several hours before she called her son for assistance. [facility] presented [facility] with ongoing chest pain.  In the urgent care, she was given aspirin 324 mg, Morphine,  normal saline, oxygen and her chest pain was somewhat reduced from 10 to 4. She refused NTG.  Her first troponin was negative, her EKG demonstrates an atrial sensed and V paced rhythm, chest x-ray demonstrated interstitial edema, and CT angiogram of the chest demonstrated interstitial pulmonary fibrosis without evidence of pulmonary embolism. She was transferred to [facility] for ongoing chest pain.       PMH:   Past Medical History  Diagnosis Date  . CAD (coronary artery disease)     a. 10/2007 PCI Diag w/ 2.25 x 8mm Taxus Atom DES.;  b. 09/2011 Lexi MV: EF 69%, no ischemia/infarct.  Marland Kitchen PVD (peripheral vascular disease)   . Carotid artery stenosis     Dr. Graciela Husbands  . HTN (hypertension)   . Hyperlipidemia   . Hypothyroidism   . CKD (chronic kidney disease), stage III   . Diabetes mellitus 07/16/2011  . Allergic state 07/16/2011  . Insomnia 07/16/2011  . Recurrent UTI 07/16/2011  . Asthma     childhood, recurrent at 39  . Myelodysplastic syndrome 04/02/2011  . Sinusitis 07/18/2011  . Anemia 07/18/2011  . Arthritis 07/18/2011  . Cataract extraction status of right eye 08/18/2011  . Anxiety and depression 08/18/2011  . Bronchitis, acute 12/20/2011  . Headache(784.0) 03/02/2012  . AV heart block     a. 10/2007 MDT Versa dual chamber PPM.  . Bladder cancer 12/2002  . Closed intertrochanteric fracture of left hip 05/27/2012  . Loss of weight 09/04/2012  . Renal failure, chronic 09/05/2012  . Conjunctivitis unspecified 11/21/2012  . Frequent falls      PSH:   Past Surgical History  Procedure Laterality Date  . Cholecystectomy    . Pcm - medtronic    . Abdominal hysterectomy    . Shoulder open rotator cuff repair    . Foot surgery      bunionectomy  . Intramedullary (im) nail intertrochanteric  05/27/2012    Procedure: INTRAMEDULLARY (IM) NAIL INTERTROCHANTRIC;  Surgeon:  Eulas Post, MD;  Location: [facility] OR;  Service: Orthopedics;  Laterality: Left;  . Pacemaker insertion      Allergies:  Codeine; Penicillins; and Sulfonamide derivatives Prior to Admit Meds:   Prescriptions prior to admission  Medication Sig Dispense Refill  . aspirin EC 81 MG tablet Take 81 mg by mouth daily.      Marland Kitchen acetaminophen (TYLENOL) 500 MG tablet Take 500 mg by mouth daily as needed. For pain      . bacitracin-polymyxin b (POLYSPORIN) ophthalmic ointment Place into the left eye every 12  (twelve) hours. apply to eye every 12 hours while awake  3.5 g  1  . levothyroxine (SYNTHROID, LEVOTHROID) 112 MCG tablet Take 1 tablet (112 mcg total) by mouth daily.  30 tablet  1  . PARoxetine (PAXIL) 20 MG tablet Take 1 tablet (20 mg total) by mouth every morning.  30 tablet  2  . rosuvastatin (CRESTOR) 20 MG tablet Take 1 tablet (20 mg total) by mouth at bedtime.  30 tablet  1  . vitamin B-12 (CYANOCOBALAMIN) 1000 MCG tablet Take 1 tablet (1,000 mcg total) by mouth daily. Prefer a sublingual preparation  30 tablet  2   Fam HX:    Family History  Problem Relation Age of Onset  . Heart disease Mother     chf  . Arthritis Mother   . Arthritis Father   . Kidney disease Father   . Hypertension Father   . Cancer Brother     lung  . Arthritis Daughter     MVA with very serious injuries   Social HX:    History   Social History  . Marital Status: Widowed    Spouse Name: N/A    Number of Children: N/A  . Years of Education: N/A   Occupational History  . Not on file.   Social History Main Topics  . Smoking status: Never Smoker   . Smokeless tobacco: Never Used  . Alcohol Use: No  . Drug Use: No  . Sexually Active: No   Other Topics Concern  . Not on file   Social History Narrative   Lives at home and uses a walker and has caretaker who comes almost every day.       ROS:  All 11 ROS were addressed and are negative except what is stated in the HPI  Physical Exam: Blood pressure 120/65, pulse 76, temperature 98.6 F (37 C), temperature source Oral, resp. rate 16, height 5' (1.524 m), weight 44.316 kg (97 lb 11.2 oz), SpO2 95.00%.    General: Well developed, well nourished, in no acute distress Head: Eyes PERRLA, No xanthomas.   Normal cephalic and atramatic  Lungs:   Crackles at bases Heart:   HRRR S1 S2 Pulses are 2+ & equal.            No carotid bruit. No JVD.  No abdominal bruits. No femoral bruits. Abdomen: Bowel sounds are positive, abdomen soft and non-tender  without masses Extremities:   No clubbing, cyanosis or edema.  DP +1 Neuro: Alert and oriented X 3. Psych:  Good affect, responds appropriately    Labs:   Lab Results  Component Value Date   WBC 16.0* 12/03/2012   HGB 10.0* 12/03/2012   HCT 32.6* 12/03/2012   MCV 106.5* 12/03/2012   PLT 161 12/03/2012    Recent Labs Lab 12/03/12 0825  NA 140  K 3.9  CL 101  CO2 27  BUN 19  CREATININE  1.20*  CALCIUM 9.7  PROT 7.1  BILITOT 0.4  ALKPHOS 58  ALT 24  AST 31  GLUCOSE 256*   No results found for this basename: PTT   Lab Results  Component Value Date   INR 3.40* 06/15/2012   INR 3.07* 06/10/2012   INR 2.86* 06/09/2012   Lab Results  Component Value Date   CKTOTAL 46 02/27/2011   CKMB 1.7 02/27/2011   TROPONINI <0.30 12/03/2012     Lab Results  Component Value Date   CHOL 126 09/04/2012   CHOL 134 04/26/2012   CHOL 123 08/17/2011   Lab Results  Component Value Date   HDL 46 09/04/2012   HDL 58.60 04/26/2012   HDL 51.80 08/17/2011   Lab Results  Component Value Date   LDLCALC 43 09/04/2012   LDLCALC 42 04/26/2012   LDLCALC 41 08/17/2011   Lab Results  Component Value Date   TRIG 184* 09/04/2012   TRIG 167.0* 04/26/2012   TRIG 149.0 08/17/2011   Lab Results  Component Value Date   CHOLHDL 2.7 09/04/2012   CHOLHDL 2 04/26/2012   CHOLHDL 2 08/17/2011   No results found for this basename: LDLDIRECT      Radiology:  Dg Chest 2 View  12/03/2012   *RADIOLOGY REPORT*  Clinical Data: Short of breath, chest pain  CHEST - 2 VIEW  Comparison: Pain chest radiograph 09/04/2012  Findings: Left-sided pacemaker with two continuous leads overlies normal cardiac silhouette. There is mild interstitial edema pattern.  Underlying emphysematous change.  No focal infiltrate. No overt pulmonary edema.  IMPRESSION: 1.  Interstitial edema is increased. 2.  Emphysematous change.   Original Report Authenticated By: Genevive Bi, M.D.   Ct Angio Chest Pe W/cm &/or Wo Cm  12/03/2012    *RADIOLOGY REPORT*  Clinical Data: Chest pain.  CT ANGIOGRAPHY CHEST  Technique:  Multidetector CT imaging of the chest using the standard protocol during bolus administration of intravenous contrast. Multiplanar reconstructed images including MIPs were obtained and reviewed to evaluate the vascular anatomy.  Contrast: OMNIPAQUE IOHEXOL 350 MG/ML SOLN  Comparison: 12/10/2007  Findings: There is no pleural effusion.  Diffuse, peripheral and basilar predominant interstitial reticulation is identified.  There is mild subpleural honeycombing within the lung bases.  No airspace consolidation or atelectasis identified.  The heart size appears normal.  There is no pericardial effusion. The main pulmonary artery and its branches are patent.  There is no evidence for pulmonary embolus.  No enlarged mediastinal or hilar lymph nodes.  There is a left chest wall pacer device identified.  Leads are in the right atrial appendage and right ventricle.  No axillary or supraclavicular adenopathy.  There is a moderate-sized hiatal hernia.  No acute findings noted within the upper abdomen.  IMPRESSION:  1.  Exam negative for acute pulmonary embolus. 2.  Chronic interstitial lung disease.  Findings likely due to usual interstitial pneumonitis.   Original Report Authenticated By: Signa Kell, M.D.    EKG:  A sensed V paced  ASSESSMENT:  1.  Chest pain with history of CAD.  EKG ventricular paced and no change from EKG 06/2012.  Initial troponin normal.  Negative Lexi myoview 09/2011 2.  Acute diastolic CHF - EF at cath 2009 70%, 69% by [facility] 09/2011 3.  CAD with cath 2009 with 95% ostial D1, 70% prox Right PL branch s/p PCI of diagonal.   4.  Symptomatic bradycardia s/p PPM 2009 5.  PVD 6.  HTN   PLAN:  1.  Continue to cycle cardiac enzymes 2.  NPO after midnight 3.  Check 2D echo - if LVF normal then consider Lexiscan Myoview - if EF reduced then cath. 4.  Continue ASA/NTPaste/Istatin 5. Will hold on IV Heparin  for now given recent head trauma 6.  IV Lasix  Quintella Reichert, MD  12/03/2012  6:24 PM

## 2012-12-03 NOTE — ED Notes (Signed)
[facility] at bedside preparing pt for transport to [facility] Room [phone]

## 2012-12-03 NOTE — ED Notes (Signed)
Report given to Sam, RN [facility]

## 2012-12-03 NOTE — ED Notes (Signed)
Pt deferred pain medication at this time. Sts she will let me know if pain increases and would like something- call bell within reach. Pt provided with graham crackers and grape juice per request- tolerating well at this time.

## 2012-12-03 NOTE — ED Notes (Signed)
Pt back from CT-pt hooked back up to monitor.

## 2012-12-03 NOTE — ED Provider Notes (Addendum)
History    CSN: [id number] Arrival date & time 12/03/12  0807  First MD Initiated Contact with Patient 12/03/12 [phone]     Chief Complaint  Patient presents with  . Chest Pain   (Consider location/radiation/quality/duration/timing/severity/associated sxs/prior Treatment) Patient is a 79 y.o. female presenting with chest pain. The history is provided by the patient.  Chest Pain Associated symptoms: nausea and shortness of breath   Associated symptoms: no abdominal pain, no back pain, no fever and no headache    patient with onset of chest pain and shortness of breath around 3:30 in the morning. States it is like someone is standing on her chest she has a history of pacemaker and is followed by [facility] cardiology. Some question of past stent. Pain is currently 10 out of 10. For fever shortness or breath nausea pain is nonradiating and no back pain. Patient has nitroglycerin at home but did not take any tonight take any aspirin.   Past Medical History  Diagnosis Date  . CAD (coronary artery disease)     a. 10/2007 PCI Diag w/ 2.25 x 8mm Taxus Atom DES.;  b. 09/2011 Lexi MV: EF 69%, no ischemia/infarct.  Marland Kitchen PVD (peripheral vascular disease)   . Carotid artery stenosis   . HTN (hypertension)   . Hyperlipidemia   . Hypothyroidism   . CKD (chronic kidney disease), stage III   . Diabetes mellitus 07/16/2011  . Allergic state 07/16/2011  . Insomnia 07/16/2011  . Recurrent UTI 07/16/2011  . Asthma     childhood, recurrent at 82  . Myelodysplastic syndrome 04/02/2011  . Sinusitis 07/18/2011  . Anemia 07/18/2011  . Arthritis 07/18/2011  . Cataract extraction status of right eye 08/18/2011  . Anxiety and depression 08/18/2011  . Bronchitis, acute 12/20/2011  . Headache(784.0) 03/02/2012  . AV heart block     a. 10/2007 MDT Versa dual chamber PPM.  . Bladder cancer 12/2002  . Closed intertrochanteric fracture of left hip 05/27/2012  . Loss of weight 09/04/2012  . Renal failure, chronic 09/05/2012  .  Conjunctivitis unspecified 11/21/2012   Past Surgical History  Procedure Laterality Date  . Cholecystectomy    . Pcm - medtronic    . Abdominal hysterectomy    . Shoulder open rotator cuff repair    . Foot surgery      bunionectomy  . Intramedullary (im) nail intertrochanteric  05/27/2012    Procedure: INTRAMEDULLARY (IM) NAIL INTERTROCHANTRIC;  Surgeon: Eulas Post, MD;  Location: [facility] OR;  Service: Orthopedics;  Laterality: Left;  . Pacemaker insertion     Family History  Problem Relation Age of Onset  . Heart disease Mother     chf  . Arthritis Mother   . Arthritis Father   . Kidney disease Father   . Hypertension Father   . Cancer Brother     lung  . Arthritis Daughter     MVA with very serious injuries   History  Substance Use Topics  . Smoking status: Never Smoker   . Smokeless tobacco: Never Used  . Alcohol Use: No   OB History   Grav Para Term Preterm Abortions TAB SAB Ect Mult Living                 Review of Systems  Constitutional: Negative for fever.  HENT: Negative for congestion and neck pain.   Eyes: Negative for redness.  Respiratory: Positive for chest tightness and shortness of breath.   Cardiovascular: Positive for chest pain.  Gastrointestinal: Positive for nausea. Negative for abdominal pain.  Genitourinary: Negative for dysuria.  Musculoskeletal: Negative for back pain.  Skin: Negative for rash.  Neurological: Negative for headaches.  Hematological: Does not bruise/bleed easily.  Psychiatric/Behavioral: Positive for confusion.    Allergies  Codeine; Penicillins; and Sulfonamide derivatives  Home Medications   Current Outpatient Rx  Name  Route  Sig  Dispense  Refill  . acetaminophen (TYLENOL) 500 MG tablet   Oral   Take 500 mg by mouth daily as needed. For pain         . bacitracin-polymyxin b (POLYSPORIN) ophthalmic ointment   Left Eye   Place into the left eye every 12 (twelve) hours. apply to eye every 12 hours while awake    3.5 g   1   . levothyroxine (SYNTHROID, LEVOTHROID) 112 MCG tablet   Oral   Take 1 tablet (112 mcg total) by mouth daily.   30 tablet   1   . PARoxetine (PAXIL) 20 MG tablet   Oral   Take 1 tablet (20 mg total) by mouth every morning.   30 tablet   2   . rosuvastatin (CRESTOR) 20 MG tablet   Oral   Take 1 tablet (20 mg total) by mouth at bedtime.   30 tablet   1   . vitamin B-12 (CYANOCOBALAMIN) 1000 MCG tablet   Oral   Take 1 tablet (1,000 mcg total) by mouth daily. Prefer a sublingual preparation   30 tablet   2    BP 122/57  Pulse 74  Temp(Src) 97.8 F (36.6 C) (Oral)  Resp 14  SpO2 100% Physical Exam  Nursing note and vitals reviewed. Constitutional: She is oriented to person, place, and time. She appears well-developed and well-nourished. No distress.  HENT:  Head: Normocephalic and atraumatic.  Mouth/Throat: Oropharynx is clear and moist.  Eyes: Conjunctivae are normal. Pupils are equal, round, and reactive to light.  Neck: Normal range of motion.  Cardiovascular: Normal rate, regular rhythm and normal heart sounds.   No murmur heard. Pulmonary/Chest: Effort normal and breath sounds normal.  Abdominal: Soft. Bowel sounds are normal. There is no tenderness.  Musculoskeletal: Normal range of motion. She exhibits no edema.  Neurological: She is alert and oriented to person, place, and time. No cranial nerve deficit. She exhibits normal muscle tone. Coordination normal.  Skin: Skin is warm. No rash noted.    ED Course  Procedures (including critical care time) Labs Reviewed  CBC WITH DIFFERENTIAL - Abnormal; Notable for the following:    WBC 16.0 (*)    RBC 3.06 (*)    Hemoglobin 10.0 (*)    HCT 32.6 (*)    MCV 106.5 (*)    Neutrophils Relative % 87 (*)    Neutro Abs 13.9 (*)    Lymphocytes Relative 4 (*)    Monocytes Absolute 1.2 (*)    All other components within normal limits  COMPREHENSIVE METABOLIC PANEL - Abnormal; Notable for the following:     Glucose, Bld 256 (*)    Creatinine, Ser 1.20 (*)    Albumin 3.0 (*)    GFR calc non Af Amer 40 (*)    GFR calc Af Amer 46 (*)    All other components within normal limits  PRO B NATRIURETIC PEPTIDE - Abnormal; Notable for the following:    Pro B Natriuretic peptide (BNP) 2120.0 (*)    All other components within normal limits  D-DIMER, QUANTITATIVE - Abnormal; Notable for the following:  D-Dimer, Quant 1.24 (*)    All other components within normal limits  URINALYSIS, ROUTINE W REFLEX MICROSCOPIC - Abnormal; Notable for the following:    Color, Urine GREEN (*)    Glucose, UA 100 (*)    Hgb urine dipstick TRACE (*)    Protein, ur 30 (*)    Leukocytes, UA MODERATE (*)    All other components within normal limits  URINE MICROSCOPIC-ADD ON - Abnormal; Notable for the following:    Squamous Epithelial / LPF FEW (*)    Bacteria, UA FEW (*)    All other components within normal limits  URINE CULTURE  TROPONIN I  TROPONIN I   Dg Chest 2 View  12/03/2012   *RADIOLOGY REPORT*  Clinical Data: Short of breath, chest pain  CHEST - 2 VIEW  Comparison: Pain chest radiograph 09/04/2012  Findings: Left-sided pacemaker with two continuous leads overlies normal cardiac silhouette. There is mild interstitial edema pattern.  Underlying emphysematous change.  No focal infiltrate. No overt pulmonary edema.  IMPRESSION: 1.  Interstitial edema is increased. 2.  Emphysematous change.   Original Report Authenticated By: Genevive Bi, M.D.   Ct Angio Chest Pe W/cm &/or Wo Cm  12/03/2012   *RADIOLOGY REPORT*  Clinical Data: Chest pain.  CT ANGIOGRAPHY CHEST  Technique:  Multidetector CT imaging of the chest using the standard protocol during bolus administration of intravenous contrast. Multiplanar reconstructed images including MIPs were obtained and reviewed to evaluate the vascular anatomy.  Contrast: OMNIPAQUE IOHEXOL 350 MG/ML SOLN  Comparison: 12/10/2007  Findings: There is no pleural effusion.   Diffuse, peripheral and basilar predominant interstitial reticulation is identified.  There is mild subpleural honeycombing within the lung bases.  No airspace consolidation or atelectasis identified.  The heart size appears normal.  There is no pericardial effusion. The main pulmonary artery and its branches are patent.  There is no evidence for pulmonary embolus.  No enlarged mediastinal or hilar lymph nodes.  There is a left chest wall pacer device identified.  Leads are in the right atrial appendage and right ventricle.  No axillary or supraclavicular adenopathy.  There is a moderate-sized hiatal hernia.  No acute findings noted within the upper abdomen.  IMPRESSION:  1.  Exam negative for acute pulmonary embolus. 2.  Chronic interstitial lung disease.  Findings likely due to usual interstitial pneumonitis.   Original Report Authenticated By: Signa Kell, M.D.   Results for orders placed during the hospital encounter of 12/03/12  CBC WITH DIFFERENTIAL      Result Value Range   WBC 16.0 (*) 4.0 - 10.5 K/uL   RBC 3.06 (*) 3.87 - 5.11 MIL/uL   Hemoglobin 10.0 (*) 12.0 - 15.0 g/dL   HCT 16.1 (*) 09.6 - 04.5 %   MCV 106.5 (*) 78.0 - 100.0 fL   MCH 32.7  26.0 - 34.0 pg   MCHC 30.7  30.0 - 36.0 g/dL   RDW 40.9  81.1 - 91.4 %   Platelets 161  150 - 400 K/uL   Neutrophils Relative % 87 (*) 43 - 77 %   Neutro Abs 13.9 (*) 1.7 - 7.7 K/uL   Lymphocytes Relative 4 (*) 12 - 46 %   Lymphs Abs 0.7  0.7 - 4.0 K/uL   Monocytes Relative 7  3 - 12 %   Monocytes Absolute 1.2 (*) 0.1 - 1.0 K/uL   Eosinophils Relative 2  0 - 5 %   Eosinophils Absolute 0.3  0.0 - 0.7 K/uL  Basophils Relative 0  0 - 1 %   Basophils Absolute 0.0  0.0 - 0.1 K/uL  TROPONIN I      Result Value Range   Troponin I <0.30  <0.30 ng/mL  COMPREHENSIVE METABOLIC PANEL      Result Value Range   Sodium 140  135 - 145 mEq/L   Potassium 3.9  3.5 - 5.1 mEq/L   Chloride 101  96 - 112 mEq/L   CO2 27  19 - 32 mEq/L   Glucose, Bld 256  (*) 70 - 99 mg/dL   BUN 19  6 - 23 mg/dL   Creatinine, Ser 1.61 (*) 0.50 - 1.10 mg/dL   Calcium 9.7  8.4 - 09.6 mg/dL   Total Protein 7.1  6.0 - 8.3 g/dL   Albumin 3.0 (*) 3.5 - 5.2 g/dL   AST 31  0 - 37 U/L   ALT 24  0 - 35 U/L   Alkaline Phosphatase 58  39 - 117 U/L   Total Bilirubin 0.4  0.3 - 1.2 mg/dL   GFR calc non Af Amer 40 (*) >90 mL/min   GFR calc Af Amer 46 (*) >90 mL/min  PRO B NATRIURETIC PEPTIDE      Result Value Range   Pro B Natriuretic peptide (BNP) 2120.0 (*) 0 - 450 pg/mL  D-DIMER, QUANTITATIVE      Result Value Range   D-Dimer, Quant 1.24 (*) 0.00 - 0.48 ug/mL-FEU  URINALYSIS, ROUTINE W REFLEX MICROSCOPIC      Result Value Range   Color, Urine GREEN (*) YELLOW   APPearance CLEAR  CLEAR   Specific Gravity, Urine 1.014  1.005 - 1.030   pH 5.5  5.0 - 8.0   Glucose, UA 100 (*) NEGATIVE mg/dL   Hgb urine dipstick TRACE (*) NEGATIVE   Bilirubin Urine NEGATIVE  NEGATIVE   Ketones, ur NEGATIVE  NEGATIVE mg/dL   Protein, ur 30 (*) NEGATIVE mg/dL   Urobilinogen, UA 0.2  0.0 - 1.0 mg/dL   Nitrite NEGATIVE  NEGATIVE   Leukocytes, UA MODERATE (*) NEGATIVE  URINE MICROSCOPIC-ADD ON      Result Value Range   Squamous Epithelial / LPF FEW (*) RARE   WBC, UA TOO NUMEROUS TO COUNT  <3 WBC/hpf   RBC / HPF 3-6  <3 RBC/hpf   Bacteria, UA FEW (*) RARE  TROPONIN I      Result Value Range   Troponin I <0.30  <0.30 ng/mL     Date: 12/03/2012  Rate: 74  Rhythm: Atrial sensed ventricular paced rhythm  QRS Axis: indeterminate  Intervals: Paced  ST/T Wave abnormalities: Paced  Conduction Disutrbances:Paced  Narrative Interpretation:   Old EKG Reviewed: none available     1. Chest pain     MDM  Patient with persistent chest pain since about 3:30 this morning. Patient without any specific cardiac history but does have a history of a pacemaker for Mobitz type II bradycardia. Patient followed by [facility] cardiology. Initially discussed with them for admission but they wanted  patient to be admitted by hospitalist. Hospital swollen the cone step down. Patient will be transferred by [facility].  Patient still has chest pain patient did except baby aspirin with some improvement of the pain from a 10 out of 10 to a 6/10 she refused nitroglycerin freeze morphine. Patient's pain currently is 6/10. Workup negative for pulmonary embolus negative for pneumonia negative for any acute change in the initial troponin EKG shows atrial paced ventricular rhythm heart rate  of 74. Past medical history raises a question of stents in the past.  Shelda Jakes, MD 12/03/12 1240  Shelda Jakes, MD 12/03/12 1241  Addendum: A more careful review of patient's past medical history was able to find evidence of placement of a stent in 2009. This was discussed again with hospitalist and cardiology cardiology still does not want and that primarily hospitalist will admit. Patient's second troponin was negative.    Shelda Jakes, MD 12/03/12 1318

## 2012-12-03 NOTE — Progress Notes (Addendum)
Received report from Lauren RN @ 1430.  Patient via stretcher with [facility] RN and is alert and oriented.  Calm and cooperative.  NAD.  Reports pain at a 3/10.  States it has improved from a 9/10.  CHG bath and skin assessment completed.  Stye noted on left eye.  No pressure sores noted.  Floor manager paged called and notified of patient's arrival.  Denies needs at this time.  Will continue to monitor.

## 2012-12-04 DIAGNOSIS — E43 Unspecified severe protein-calorie malnutrition: Secondary | ICD-10-CM | POA: Insufficient documentation

## 2012-12-04 DIAGNOSIS — I059 Rheumatic mitral valve disease, unspecified: Secondary | ICD-10-CM

## 2012-12-04 LAB — GLUCOSE, CAPILLARY
Glucose-Capillary: 135 mg/dL — ABNORMAL HIGH (ref 70–99)
Glucose-Capillary: 119 mg/dL — ABNORMAL HIGH (ref 70–99)

## 2012-12-04 LAB — CBC
HCT: 27.6 % — ABNORMAL LOW (ref 36.0–46.0)
Hemoglobin: 8.6 g/dL — ABNORMAL LOW (ref 12.0–15.0)
MCH: 32.1 pg (ref 26.0–34.0)
MCHC: 31.2 g/dL (ref 30.0–36.0)
RBC: 2.68 MIL/uL — ABNORMAL LOW (ref 3.87–5.11)

## 2012-12-04 LAB — URINE CULTURE

## 2012-12-04 LAB — BASIC METABOLIC PANEL
BUN: 21 mg/dL (ref 6–23)
Chloride: 99 mEq/L (ref 96–112)
GFR calc non Af Amer: 37 mL/min — ABNORMAL LOW (ref >90)
Glucose, Bld: 146 mg/dL — ABNORMAL HIGH (ref 70–99)
Potassium: 3.6 mEq/L (ref 3.5–5.1)
Sodium: 137 mEq/L (ref 135–145)

## 2012-12-04 LAB — TSH: TSH: 0.405 u[IU]/mL (ref 0.350–4.500)

## 2012-12-04 LAB — PROTIME-INR
INR: 1.1 (ref 0.00–1.49)
Prothrombin Time: 14 seconds (ref 11.6–15.2)

## 2012-12-04 MED ORDER — ENSURE COMPLETE PO LIQD
237.0000 mL | Freq: Two times a day (BID) | ORAL | Status: DC
  Administered 2012-12-04 – 2012-12-06 (×3): 237 mL via ORAL

## 2012-12-04 MED ORDER — ENOXAPARIN SODIUM 40 MG/0.4ML ~~LOC~~ SOLN
40.0000 mg | SUBCUTANEOUS | Status: DC
  Administered 2012-12-04 – 2012-12-05 (×2): 40 mg via SUBCUTANEOUS
  Filled 2012-12-04 (×3): qty 0.4

## 2012-12-04 MED FILL — Morphine Sulfate Inj PF 0.5 MG/ML: INTRAMUSCULAR | Qty: 2 | Status: AC

## 2012-12-04 NOTE — Progress Notes (Addendum)
[facility] Progress Note [place] TEAM 1 - Stepdown/ICU TEAM   [name] OZH:[id number] DOB: [birth year] DOA: 12/03/2012 PCP: Danise Edge, MD  Brief narrative: 79 year-old female with history of hypertension, hyperlipidemia, CAD status post cardiac catheterization in June of 2009 with a drug-eluting stent to the ostium of diagonal, negative stress test in May of 2013, history of symptomatic bradycardia with Mobitz 2 AV block status post Medtronic dual lead permanent pacemaker, followed by Dr. Graciela Husbands. She also has history of interstitial pulmonary fibrosis, myelodysplasia with chronic anemia, chronic kidney disease stage III, diet-controlled diabetes, hypothyroidism, acid reflux with history of gastric erosions, and bladder cancer.  She was at her baseline health one month before admit when she had a fall resulting in injury to her neck. She had been less mobile as a result. For 2 weeks she had progressive shortness of breath, increased fatigue, and mild cough. She denied chest pain or shortness of breath. The morning of her admit she awoke with 10 out of 10 substernal chest pressure. "I felt like someone was standing on my chest." The pain was associated with shortness of breath, clamminess, mild nausea. She had the pain for several hours before she called her son for assistance. She presented to [facility] with ongoing chest pain.  She was given aspirin 324 mg, nitroglycerin, normal saline, oxygen and her chest pain was somewhat reduced from 10 to 4. Her first troponin was negative, her EKG demonstrated an atrial paced rhythm however the axis had changed.  Chest x-ray demonstrated interstitial edema, and CT angiogram of the chest demonstrated interstitial pulmonary fibrosis without evidence of pulmonary embolism. She was transferred to [facility] for ongoing chest pain.   Assessment/Plan:  Chest pain troponins negative - CT angio negative for PE - sx worrisome for [facility] -  Cardiology is directing w/u   CAD 10/2007 PCI Diag w/ 2.25 x 8mm Taxus Atom DES - 09/2011 Lexi MV: EF 69%, no ischemia/infarct  Chronic renal insufficiency - GFR ~40 Baseline crt appears to have ranged from 1.0 - 1.5 thus far during 2014 - follow trend   HYPOTHYROIDISM TSH is within goal range for replacement tx  HYPERLIPIDEMIA Continue lipitor  HYPERTENSION BP is currently well controlled  PVD  Myelodysplastic syndrome / Macrocytic anemia Baseline Hgb appears to be ~9 - follow trend - "characterized by chronic, macrocytic anemia, mild leukocytosis, and borderline thrombocytopenia" per Dr. Cyndie Chime - has required Q 3 week Aranesp injections  Diabetes mellitus CBG reasonably controlled - A1c 6.5  Recurrent UTI UA abnormal this admit, but culture unrevealing - may simply be colonized - review of old cultures reveals no recurring offending pathogen - if WBC remains elevated, or if pt develops fever, will need to consider changing to different abx (resistance to cipro is common in our community)  Anxiety and depression Appears well compensated at this time   Falls Will need PT/OT eval after medically stable   Code Status: FULL Family Communication: spoke w/ pt and daughter at bedside Disposition Plan: SDU  Consultants: Cardiology - [place]  Procedures: TTE - 7/20 - pending  Antibiotics: Oral cipro 7/20>>  DVT prophylaxis: IV heparin  HPI/Subjective: Pt is resting comfortably in bedside chair.  Denies any complaints whatsoever at this time.  Is anxious to be d/c home asap.     Objective: Blood pressure 144/91, pulse 75, temperature 98.7 F (37.1 C), temperature source Oral, resp. rate 17, height 5' (1.524 m), weight 42.5 kg (93 lb 11.1 oz),  SpO2 98.00%.  Intake/Output Summary (Last 24 hours) at 12/04/12 1446 Last data filed at 12/04/12 0000  Gross per 24 hour  Intake     60 ml  Output      0 ml  Net     60 ml     Exam: General: No acute respiratory  distress at rest Lungs: very faint fine crackles th/o all fields - no wheeze  Cardiovascular: Regular rate and rhythm without murmur gallop or rub normal S1 and S2 Abdomen: Nontender, nondistended, soft, bowel sounds positive, no rebound, no ascites, no appreciable mass Extremities: No significant cyanosis, clubbing, or edema bilateral lower extremities  Data Reviewed: Basic Metabolic Panel:  Recent Labs Lab 12/03/12 0825 12/04/12 0447  NA 140 137  K 3.9 3.6  CL 101 99  CO2 27 30  GLUCOSE 256* 146*  BUN 19 21  CREATININE 1.20* 1.26*  CALCIUM 9.7 9.2   Liver Function Tests:  Recent Labs Lab 12/03/12 0825  AST 31  ALT 24  ALKPHOS 58  BILITOT 0.4  PROT 7.1  ALBUMIN 3.0*   CBC:  Recent Labs Lab 12/03/12 0825 12/04/12 0447  WBC 16.0* 13.4*  NEUTROABS 13.9*  --   HGB 10.0* 8.6*  HCT 32.6* 27.6*  MCV 106.5* 103.0*  PLT 161 143*   Cardiac Enzymes:  Recent Labs Lab 12/03/12 0825 12/03/12 1153 12/03/12 1916 12/03/12 2319 12/04/12 0447  TROPONINI <0.30 <0.30 <0.30 <0.30 <0.30   BNP (last 3 results)  Recent Labs  04/20/12 1226 12/03/12 0825  PROBNP 113.0* 2120.0*   CBG:  Recent Labs Lab 12/03/12 1506 12/03/12 2023 12/04/12 0757 12/04/12 1124  GLUCAP 134* 100* 162* 135*    Recent Results (from the past 240 hour(s))  URINE CULTURE     Status: None   Collection Time    12/03/12 10:44 AM      Result Value Range Status   Specimen Description URINE, RANDOM   Final   Special Requests NONE   Final   Culture  Setup Time 12/03/2012 16:48   Final   Colony Count NO GROWTH   Final   Culture NO GROWTH   Final   Report Status 12/04/2012 FINAL   Final  MRSA PCR SCREENING     Status: None   Collection Time    12/03/12  4:43 PM      Result Value Range Status   MRSA by PCR NEGATIVE  NEGATIVE Final   Comment:            The GeneXpert MRSA Assay (FDA     approved for NASAL specimens     only), is one component of a     comprehensive MRSA colonization      surveillance program. It is not     intended to diagnose MRSA     infection nor to guide or     monitor treatment for     MRSA infections.     Studies:  Recent x-ray studies have been reviewed in detail by the Attending Physician  Scheduled Meds:  Scheduled Meds: . aspirin EC  325 mg Oral Daily  . atorvastatin  40 mg Oral q1800  . bacitracin-polymyxin b   Left Eye Q12H  . ciprofloxacin  250 mg Oral Q24H  . enoxaparin (LOVENOX) injection  40 mg Subcutaneous Q24H  . insulin aspart  0-9 Units Subcutaneous TID WC  . levothyroxine  112 mcg Oral QAC breakfast  . nitroGLYCERIN  0.5 inch Topical Q6H  . pantoprazole  40  mg Oral Daily  . PARoxetine  20 mg Oral Daily  . pneumococcal 23 valent vaccine  0.5 mL Intramuscular Tomorrow-1000  . sodium chloride  3 mL Intravenous Q12H  . vitamin B-12  1,000 mcg Oral Daily    Time spent on care of this patient:   [facility] T  [facility] Office  [phone] Pager - Text Page per Loretha Stapler as per below:  On-Call/Text Page:      [email or web address]      password [email or web address]  If 7PM-7AM, please contact night-coverage [email or web address] Password [email or web address] 12/04/2012, 2:46 PM   LOS: 1 day

## 2012-12-04 NOTE — Progress Notes (Signed)
  Echocardiogram 2D Echocardiogram has been performed.  Arvil Chaco 12/04/2012, 10:27 AM

## 2012-12-04 NOTE — Consult Note (Signed)
ANTICOAGULATION CONSULT NOTE - Initial Consult  Pharmacy Consult for Lovenox Indication: chest pain/ACS  Allergies  Allergen Reactions  . Codeine Anaphylaxis and Swelling  . Penicillins Anaphylaxis and Swelling  . Sulfonamide Derivatives Anaphylaxis and Swelling    Patient Measurements: Height: 5' (152.4 cm) Weight: 93 lb 11.1 oz (42.5 kg) IBW/kg (Calculated) : 45.5  Vital Signs: Temp: 98.5 F (36.9 C) (07/21 0739) Temp src: Oral (07/21 0739) BP: 109/62 mmHg (07/21 0739) Pulse Rate: 82 (07/21 0739)  Labs:  Recent Labs  12/03/12 0825  12/03/12 1916 12/03/12 2319 12/04/12 0447  HGB 10.0*  --   --   --  8.6*  HCT 32.6*  --   --   --  27.6*  PLT 161  --   --   --  143*  LABPROT  --   --   --   --  14.0  INR  --   --   --   --  1.10  CREATININE 1.20*  --   --   --  1.26*  TROPONINI <0.30  < > <0.30 <0.30 <0.30  < > = values in this interval not displayed.  Estimated Creatinine Clearance: 21.5 ml/min (by C-G formula based on Cr of 1.26).   Medical History: Past Medical History  Diagnosis Date  . CAD (coronary artery disease)     a. 10/2007 PCI Diag w/ 2.25 x 8mm Taxus Atom DES.;  b. 09/2011 Lexi MV: EF 69%, no ischemia/infarct.  Marland Kitchen PVD (peripheral vascular disease)   . Carotid artery stenosis     Dr. Graciela Husbands  . HTN (hypertension)   . Hyperlipidemia   . Hypothyroidism   . CKD (chronic kidney disease), stage III   . Diabetes mellitus 07/16/2011  . Allergic state 07/16/2011  . Insomnia 07/16/2011  . Recurrent UTI 07/16/2011  . Asthma     childhood, recurrent at 69  . Myelodysplastic syndrome 04/02/2011  . Sinusitis 07/18/2011  . Anemia 07/18/2011  . Arthritis 07/18/2011  . Cataract extraction status of right eye 08/18/2011  . Anxiety and depression 08/18/2011  . Bronchitis, acute 12/20/2011  . Headache(784.0) 03/02/2012  . AV heart block     a. 10/2007 MDT Versa dual chamber PPM.  . Bladder cancer 12/2002  . Closed intertrochanteric fracture of left hip 05/27/2012  . Loss of  weight 09/04/2012  . Renal failure, chronic 09/05/2012  . Conjunctivitis unspecified 11/21/2012  . Frequent falls    Assessment: 88yof with known CAD and prior stent  to begin full dose lovenox for CP. She also has a history of CKD with CrCl 52ml/min so will use 1mg /kg sq q24 dosing. She was previously on prophylactic lovenox with last dose 7/20 @ 2036. Her CBC is low but she has a history of chronic anemia. Plan is for myoview.  Goal of Therapy:  Anti-Xa level 0.6-1.2 units/ml 4hrs after LMWH dose given Monitor platelets by anticoagulation protocol: Yes   Plan:  1) Lovenox 40mg  sq q24 2) Follow up myoview results 3) CBC q72h  Fredrik Rigger 12/04/2012,11:27 AM

## 2012-12-04 NOTE — Progress Notes (Signed)
INITIAL NUTRITION ASSESSMENT  DOCUMENTATION CODES Per approved criteria  -Severe malnutrition in the context of chronic illness   INTERVENTION: 1.  Modify diet; recommend diet liberalization to Regular 2.  Supplements; Ensure Complete po BID, each supplement provides 350 kcal and 13 grams of protein.   NUTRITION DIAGNOSIS: Malnutrition related to poor appetite and intake as evidenced by pt with unintentional wt loss of 11 lbs since January.   Monitor:  1.  Food/Beverage; pt meeting >/=90% estimated needs with tolerance. 2.  Wt/wt change; monitor trends, deter loss  Reason for Assessment: MST  79 y.o. female  Admitting Dx: Chest pain  ASSESSMENT: Pt admitted with chest pain, shortness of breath. Pt admitted for fall with hip fx in January.  Also with recent fall 1 month ago with neck injury.    Family and pt acknowledge wt loss.  Family states pt eating breakfast daily and is provided with lunch and dinner meals.  Pt states she doesn't eat very well at home. Suspect interventions in place are providing pt with meals daily, however pt may not be eating suffficiently.  Encouraged to continue with supplement regimen.  Nutrition Focused Physical Exam:  Subcutaneous Fat:  Orbital Region: moderate Upper Arm Region: severe Thoracic and Lumbar Region: n/a  Muscle:  Temple Region: moderate Clavicle Bone Region: severe Clavicle and Acromion Bone Region: n/a Scapular Bone Region: n/a Dorsal Hand: n/a Patellar Region: n/a Anterior Thigh Region: n/a Posterior Calf Region: n/a  Edema: none present  Pt meets criteria for severe MALNUTRITION in the context of chronic as evidenced by 11% wt loss in 6 months and evidence of wasting on physical exam.   Height: Ht Readings from Last 1 Encounters:  12/03/12 5' (1.524 m)    Weight: Wt Readings from Last 1 Encounters:  12/04/12 93 lb 11.1 oz (42.5 kg)    Ideal Body Weight: 100 lbs  % Ideal Body Weight: 93%  Wt Readings from  Last 10 Encounters:  12/04/12 93 lb 11.1 oz (42.5 kg)  11/21/12 96 lb (43.545 kg)  11/07/12 97 lb 6.4 oz (44.18 kg)  10/30/12 97 lb 9.6 oz (44.271 kg)  10/20/12 102 lb 12.8 oz (46.63 kg)  09/13/12 102 lb 6.4 oz (46.448 kg)  09/04/12 98 lb (44.453 kg)  07/12/12 101 lb (45.813 kg)  06/07/12 107 lb 4.8 oz (48.671 kg)  05/29/12 104 lb 11.5 oz (47.5 kg)    Usual Body Weight: 108 lbs  % Usual Body Weight: 87%  BMI:  Body mass index is 18.3 kg/(m^2).  Estimated Nutritional Needs: Kcal: 1200-1400 Protein: 50-62g Fluid: 1.2-1.5 L/day  Skin: intact  Diet Order: Cardiac  EDUCATION NEEDS: -Education needs addressed   Intake/Output Summary (Last 24 hours) at 12/04/12 1057 Last data filed at 12/04/12 0000  Gross per 24 hour  Intake    560 ml  Output      0 ml  Net    560 ml    Last BM: 7/20  Labs:   Recent Labs Lab 12/03/12 0825 12/04/12 0447  NA 140 137  K 3.9 3.6  CL 101 99  CO2 27 30  BUN 19 21  CREATININE 1.20* 1.26*  CALCIUM 9.7 9.2  GLUCOSE 256* 146*    CBG (last 3)   Recent Labs  12/03/12 1506 12/03/12 2023  GLUCAP 134* 100*    Scheduled Meds: . aspirin EC  325 mg Oral Daily  . atorvastatin  40 mg Oral q1800  . bacitracin-polymyxin b   Left Eye Q12H  .  ciprofloxacin  250 mg Oral Q24H  . enoxaparin (LOVENOX) injection  20 mg Subcutaneous Q24H  . insulin aspart  0-9 Units Subcutaneous TID WC  . levothyroxine  112 mcg Oral QAC breakfast  . nitroGLYCERIN  0.5 inch Topical Q6H  . pantoprazole  40 mg Oral Daily  . PARoxetine  20 mg Oral Daily  . pneumococcal 23 valent vaccine  0.5 mL Intramuscular Tomorrow-1000  . sodium chloride  3 mL Intravenous Q12H  . vitamin B-12  1,000 mcg Oral Daily    Continuous Infusions:   Past Medical History  Diagnosis Date  . CAD (coronary artery disease)     a. 10/2007 PCI Diag w/ 2.25 x 8mm Taxus Atom DES.;  b. 09/2011 Lexi MV: EF 69%, no ischemia/infarct.  Marland Kitchen PVD (peripheral vascular disease)   . Carotid  artery stenosis     Dr. Graciela Husbands  . HTN (hypertension)   . Hyperlipidemia   . Hypothyroidism   . CKD (chronic kidney disease), stage III   . Diabetes mellitus 07/16/2011  . Allergic state 07/16/2011  . Insomnia 07/16/2011  . Recurrent UTI 07/16/2011  . Asthma     childhood, recurrent at 89  . Myelodysplastic syndrome 04/02/2011  . Sinusitis 07/18/2011  . Anemia 07/18/2011  . Arthritis 07/18/2011  . Cataract extraction status of right eye 08/18/2011  . Anxiety and depression 08/18/2011  . Bronchitis, acute 12/20/2011  . Headache(784.0) 03/02/2012  . AV heart block     a. 10/2007 MDT Versa dual chamber PPM.  . Bladder cancer 12/2002  . Closed intertrochanteric fracture of left hip 05/27/2012  . Loss of weight 09/04/2012  . Renal failure, chronic 09/05/2012  . Conjunctivitis unspecified 11/21/2012  . Frequent falls     Past Surgical History  Procedure Laterality Date  . Cholecystectomy    . Pcm - medtronic    . Abdominal hysterectomy    . Shoulder open rotator cuff repair    . Foot surgery      bunionectomy  . Intramedullary (im) nail intertrochanteric  05/27/2012    Procedure: INTRAMEDULLARY (IM) NAIL INTERTROCHANTRIC;  Surgeon: Eulas Post, MD;  Location: [facility] OR;  Service: Orthopedics;  Laterality: Left;  . Pacemaker insertion      Loyce Dys, MS RD LDN Clinical Inpatient Dietitian Pager: [phone] Weekend/After hours pager: [phone]

## 2012-12-04 NOTE — Progress Notes (Signed)
SUBJECTIVE: The patient is doing better today. She is still complaining of chest pain, but states it is not as bad as yesterday.     Pain worrisome with awakening from sleep, radiation into neck, sob and diaphoresis'  largey relieved yesterday with application of topical NTG and discomfort today mostly minimal  Echo pending this morning.  Troponins have been negative this admission.  CT angio negative for PE.   CURRENT MEDICATIONS: . aspirin EC  325 mg Oral Daily  . atorvastatin  40 mg Oral q1800  . bacitracin-polymyxin b   Left Eye Q12H  . ciprofloxacin  250 mg Oral Q24H  . enoxaparin (LOVENOX) injection  20 mg Subcutaneous Q24H  . insulin aspart  0-9 Units Subcutaneous TID WC  . levothyroxine  112 mcg Oral QAC breakfast  . nitroGLYCERIN  0.5 inch Topical Q6H  . pantoprazole  40 mg Oral Daily  . PARoxetine  20 mg Oral Daily  . pneumococcal 23 valent vaccine  0.5 mL Intramuscular Tomorrow-1000  . sodium chloride  3 mL Intravenous Q12H  . vitamin B-12  1,000 mcg Oral Daily      OBJECTIVE: Physical Exam: Filed Vitals:   12/04/12 0329 12/04/12 0456 12/04/12 0500 12/04/12 0739  BP: 114/56 120/67  109/62  Pulse: 79 80  82  Temp: 98.5 F (36.9 C)   98.5 F (36.9 C)  TempSrc: Oral   Oral  Resp: 14 17  15   Height:      Weight:   93 lb 11.1 oz (42.5 kg)   SpO2: 97% 99%  93%    Intake/Output Summary (Last 24 hours) at 12/04/12 0941 Last data filed at 12/04/12 0000  Gross per 24 hour  Intake    560 ml  Output      0 ml  Net    560 ml    Telemetry reveals p synchronous pacing  Well developed and nourished in no acute distress HENT normal Neck supple with JVP-flat Clear Regular rate and rhythm, no murmurs or gallops Abd-soft with active BS No Clubbing cyanosis edema Skin-warm and dry A & Oriented  Grossly normal sensory and motor function   LABS: Basic Metabolic Panel:  Recent Labs  16/10/96 0825 12/04/12 0447  NA 140 137  K 3.9 3.6  CL 101 99  CO2 27 30    GLUCOSE 256* 146*  BUN 19 21  CREATININE 1.20* 1.26*  CALCIUM 9.7 9.2   Liver Function Tests:  Recent Labs  12/03/12 0825  AST 31  ALT 24  ALKPHOS 58  BILITOT 0.4  PROT 7.1  ALBUMIN 3.0*   CBC:  Recent Labs  12/03/12 0825 12/04/12 0447  WBC 16.0* 13.4*  NEUTROABS 13.9*  --   HGB 10.0* 8.6*  HCT 32.6* 27.6*  MCV 106.5* 103.0*  PLT 161 143*   Cardiac Enzymes:  Recent Labs  12/03/12 1916 12/03/12 2319 12/04/12 0447  TROPONINI <0.30 <0.30 <0.30   D-Dimer:  Recent Labs  12/03/12 0825  DDIMER 1.24*   Hemoglobin A1C:  Recent Labs  12/03/12 1920  HGBA1C 6.5*   Fasting Lipid Panel:  Recent Labs  12/03/12 1916  CHOL 94  HDL 32*  LDLCALC 23  TRIG 045*  CHOLHDL 2.9   Thyroid Function Tests:  Recent Labs  12/03/12 1920  TSH 0.405   RADIOLOGY: Dg Chest 2 View 12/03/2012   *RADIOLOGY REPORT*  Clinical Data: Short of breath, chest pain  CHEST - 2 VIEW  Comparison: Pain chest radiograph 09/04/2012  Findings: Left-sided  pacemaker with two continuous leads overlies normal cardiac silhouette. There is mild interstitial edema pattern.  Underlying emphysematous change.  No focal infiltrate. No overt pulmonary edema.  IMPRESSION: 1.  Interstitial edema is increased. 2.  Emphysematous change.   Original Report Authenticated By: Genevive Bi, M.D.   Ct Angio Chest Pe W/cm &/or Wo Cm 12/03/2012   *RADIOLOGY REPORT*  Clinical Data: Chest pain.  CT ANGIOGRAPHY CHEST  Technique:  Multidetector CT imaging of the chest using the standard protocol during bolus administration of intravenous contrast. Multiplanar reconstructed images including MIPs were obtained and reviewed to evaluate the vascular anatomy.  Contrast: OMNIPAQUE IOHEXOL 350 MG/ML SOLN  Comparison: 12/10/2007  Findings: There is no pleural effusion.  Diffuse, peripheral and basilar predominant interstitial reticulation is identified.  There is mild subpleural honeycombing within the lung bases.  No  airspace consolidation or atelectasis identified.  The heart size appears normal.  There is no pericardial effusion. The main pulmonary artery and its branches are patent.  There is no evidence for pulmonary embolus.  No enlarged mediastinal or hilar lymph nodes.  There is a left chest wall pacer device identified.  Leads are in the right atrial appendage and right ventricle.  No axillary or supraclavicular adenopathy.  There is a moderate-sized hiatal hernia.  No acute findings noted within the upper abdomen.  IMPRESSION:  1.  Exam negative for acute pulmonary embolus. 2.  Chronic interstitial lung disease.  Findings likely due to usual interstitial pneumonitis.   Original Report Authenticated By: Signa Kell, M.D.   ECG  P-synchronous/ AV  pacing    ASSESSMENT AND PLAN:  Principal Problem:   Chest pain Active Problems:   HYPOTHYROIDISM   HYPERLIPIDEMIA-MIXED   HYPERTENSION, UNSPECIFIED   PVD   HYPOTENSION, ORTHOSTATIC   Myelodysplastic syndrome   CAD   Diabetes mellitus   Recurrent UTI   Macrocytic anemia   Anxiety and depression   Falls   Conjunctivitis unspecified   Leukocytosis, unspecified   Skin lesion of hand   Chest pain worrsiome for angina in pt with known CAD and prior stent With renal inusfficinecy Gd 3 would like to use myoview to risk stratify prior to submitting for cath Reviewed with patient Will change to ACS LMWH

## 2012-12-04 NOTE — Progress Notes (Signed)
Physical Therapy Evaluation Patient Details Name: [name] MRN: [id number] DOB: 06-20-1923 Today's Date: 12/04/2012 Time: 9147-8295 PT Time Calculation (min): 22 min  PT Assessment / Plan / Recommendation History of Present Illness  Pt presents with chest pain that has now dissipated.  Clinical Impression  Pt reports that chest pain has resolved, from a mobility standpoint, she has not lost any ground. She is using her RW regularly in and out of the house and has a caregiver that comes 5 days/wk as well as Life [employer]. She is mod I with activity. Reviewed HEP and encouraged her to ambulate in hall with nsg. Not current acute or f/u PT needs at this time. PT signing off.    PT Assessment  Patent does not need any further PT services    Follow Up Recommendations  No PT follow up    Does the patient have the potential to tolerate intense rehabilitation      Barriers to Discharge        Equipment Recommendations  None recommended by PT    Recommendations for Other Services     Frequency      Precautions / Restrictions Precautions Precautions: Fall Precaution Comments: h/o falls in the past Restrictions Weight Bearing Restrictions: No   Pertinent Vitals/Pain VSS     Mobility  Bed Mobility Bed Mobility: Not assessed (pt in bathroom) Transfers Transfers: Sit to Stand;Stand to Sit Sit to Stand: 6: Modified independent (Device/Increase time);From chair/3-in-1;From toilet;With upper extremity assist Stand to Sit: 6: Modified independent (Device/Increase time);To chair/3-in-1;To toilet;With upper extremity assist Ambulation/Gait Ambulation/Gait Assistance: 6: Modified independent (Device/Increase time) Ambulation Distance (Feet): 125 Feet Assistive device: Rolling walker Ambulation/Gait Assistance Details: pt ambulates safely with RW, no LOB Gait Pattern: Within Functional Limits Gait velocity: household speed Stairs: No Wheelchair Mobility Wheelchair Mobility: No     Exercises General Exercises - Lower Extremity Mini-Sqauts: AROM;Both;5 reps;Standing   PT Diagnosis:    PT Problem List:   PT Treatment Interventions:       PT Goals(Current goals can be found in the care plan section) Acute Rehab PT Goals Patient Stated Goal: return home  Visit Information  Last PT Received On: 12/04/12 Assistance Needed: +1 History of Present Illness: Pt presents with chest pain that has now dissipated.       Prior Functioning  Home Living Family/patient expects to be discharged to:: Private residence Living Arrangements: Alone Available Help at Discharge: Family;Available PRN/intermittently;Personal care attendant (caregiver 5x/ wk) Type of Home: House Home Access: Stairs to enter [employer] of Steps: 2 from driveway->porch, 1 porch ->sunroom Entrance Stairs-Rails: Left;Right Home Layout: One level Home Equipment: Environmental consultant - 2 wheels;Bedside commode;Tub bench;Cane - single point Prior Function Level of Independence: Needs assistance Gait / Transfers Assistance Needed: none needed ADL's / Homemaking Assistance Needed: was only taking shower when caregiver was there but has recently begun taking by herself. Has life alert. Does not cook or clean. Comments: not friving anymore, dtr and caregiver get her groceries and take her to appt. Has had several falls with injury, last one Nov 2013 Communication Communication: No difficulties Dominant Hand: Right    Cognition  Cognition Arousal/Alertness: Awake/alert Behavior During Therapy: WFL for tasks assessed/performed Overall Cognitive Status: Within Functional Limits for tasks assessed    Extremity/Trunk Assessment Upper Extremity Assessment Upper Extremity Assessment: Overall WFL for tasks assessed Lower Extremity Assessment Lower Extremity Assessment: Overall WFL for tasks assessed Cervical / Trunk Assessment Cervical / Trunk Assessment: Normal   Balance Balance Balance  Assessed:  Yes Dynamic Standing Balance Dynamic Standing - Balance Support: No upper extremity supported;During functional activity Dynamic Standing - Level of Assistance: 6: Modified independent (Device/Increase time)  End of Session PT - End of Session Equipment Utilized During Treatment: Gait belt Activity Tolerance: Patient tolerated treatment well Patient left: in chair;with call bell/phone within reach Nurse Communication: Mobility status  GP Functional Assessment Tool Used: clinical judgement Functional Limitation: Mobility: Walking and moving around Mobility: Walking and Moving Around Current Status ([id number]): 0 percent impaired, limited or restricted Mobility: Walking and Moving Around Goal Status [phone]): 0 percent impaired, limited or restricted Mobility: Walking and Moving Around Discharge Status [phone]): 0 percent impaired, limited or restricted  Lyanne Co, PT  Acute Rehab Services  [phone]  Lyanne Co 12/04/2012, 2:13 PM

## 2012-12-04 NOTE — Care Management Note (Signed)
    Page 1 of 1   12/04/2012     10:55:40 AM   CARE MANAGEMENT NOTE 12/04/2012  Patient:  [name], [name]   Account Number:  [id number]  Date Initiated:  12/04/2012  Documentation initiated by:  Junius Creamer  Subjective/Objective Assessment:   adm w ch pain     Action/Plan:   lives alone, pcp dr Misty Stanley blyth   Anticipated DC Date:     Anticipated DC Plan:        DC Planning Services  CM consult      Choice offered to / List presented to:             Status of service:   Medicare Important Message given?   (If response is "NO", the following Medicare IM given date fields will be blank) Date Medicare IM given:   Date Additional Medicare IM given:    Discharge Disposition:    Per UR Regulation:  Reviewed for med. necessity/level of care/duration of stay  If discussed at Long Length of Stay Meetings, dates discussed:    Comments:  7/21 1054a debbie Nathanie Ottley rn,bsn lives alone. will moniter for dc needs as pt progresses. recent fall.

## 2012-12-05 ENCOUNTER — Observation Stay (HOSPITAL_COMMUNITY): Payer: Medicare Other

## 2012-12-05 DIAGNOSIS — R079 Chest pain, unspecified: Secondary | ICD-10-CM

## 2012-12-05 DIAGNOSIS — E039 Hypothyroidism, unspecified: Secondary | ICD-10-CM

## 2012-12-05 DIAGNOSIS — I2510 Atherosclerotic heart disease of native coronary artery without angina pectoris: Secondary | ICD-10-CM

## 2012-12-05 LAB — BASIC METABOLIC PANEL
CO2: 32 mEq/L (ref 19–32)
Chloride: 101 mEq/L (ref 96–112)
GFR calc Af Amer: 40 mL/min — ABNORMAL LOW (ref >90)
Potassium: 4.2 mEq/L (ref 3.5–5.1)
Sodium: 139 mEq/L (ref 135–145)

## 2012-12-05 LAB — GLUCOSE, CAPILLARY
Glucose-Capillary: 242 mg/dL — ABNORMAL HIGH (ref 70–99)
Glucose-Capillary: 169 mg/dL — ABNORMAL HIGH (ref 70–99)

## 2012-12-05 MED ORDER — TECHNETIUM TC 99M SESTAMIBI GENERIC - CARDIOLITE
10.0000 | Freq: Once | INTRAVENOUS | Status: AC | PRN
  Administered 2012-12-05: 10 via INTRAVENOUS

## 2012-12-05 MED ORDER — REGADENOSON 0.4 MG/5ML IV SOLN
0.4000 mg | Freq: Once | INTRAVENOUS | Status: AC
  Administered 2012-12-05: 0.4 mg via INTRAVENOUS

## 2012-12-05 MED ORDER — TECHNETIUM TC 99M SESTAMIBI GENERIC - CARDIOLITE
30.0000 | Freq: Once | INTRAVENOUS | Status: AC | PRN
  Administered 2012-12-05: 30 via INTRAVENOUS

## 2012-12-05 NOTE — Progress Notes (Signed)
Patient: [name] Date of Encounter: 12/05/2012, 7:50 AM Admit date: 12/03/2012     Subjective  [name] states her chest pain is still occurring "every so often." She reports DOE but no SOB at rest. DOE is not new for her. She denies any other complaints.   Objective  Physical Exam: Vitals: BP 118/61  Pulse 75  Temp(Src) 98.5 F (36.9 C) (Oral)  Resp 21  Ht 5' (1.524 m)  Wt 93 lb 0.6 oz (42.2 kg)  BMI 18.17 kg/m2  SpO2 100% General: Well developed, thin 79 year old female in no acute distress. Neck: Supple. JVD not elevated. Lungs: Few rales at right base otherwise clear bilaterally to auscultation without wheezes or rhonchi. Breathing is unlabored. Heart: RRR S1 S2 without murmurs, rubs, or gallops.  Abdomen: Soft, non-distended. Extremities: No clubbing or cyanosis. No edema.   Neuro: Alert and oriented X 3. Moves all extremities spontaneously. No focal deficits.  Intake/Output:  Intake/Output Summary (Last 24 hours) at 12/05/12 0750 Last data filed at 12/04/12 2100  Gross per 24 hour  Intake    540 ml  Output    150 ml  Net    390 ml    Inpatient Medications:  . aspirin EC  325 mg Oral Daily  . atorvastatin  40 mg Oral q1800  . bacitracin-polymyxin b   Left Eye Q12H  . ciprofloxacin  250 mg Oral Q24H  . enoxaparin (LOVENOX) injection  40 mg Subcutaneous Q24H  . feeding supplement  237 mL Oral BID BM  . insulin aspart  0-9 Units Subcutaneous TID WC  . levothyroxine  112 mcg Oral QAC breakfast  . nitroGLYCERIN  0.5 inch Topical Q6H  . pantoprazole  40 mg Oral Daily  . PARoxetine  20 mg Oral Daily  . pneumococcal 23 valent vaccine  0.5 mL Intramuscular Tomorrow-1000  . sodium chloride  3 mL Intravenous Q12H  . vitamin B-12  1,000 mcg Oral Daily    Labs:  Recent Labs  12/04/12 0447 12/05/12 0405  NA 137 139  K 3.6 4.2  CL 99 101  CO2 30 32  GLUCOSE 146* 125*  BUN 21 22  CREATININE 1.26* 1.35*  CALCIUM 9.2 9.4    Recent Labs   12/03/12 0825  AST 31  ALT 24  ALKPHOS 58  BILITOT 0.4  PROT 7.1  ALBUMIN 3.0*    Recent Labs  12/03/12 0825 12/04/12 0447  WBC 16.0* 13.4*  NEUTROABS 13.9*  --   HGB 10.0* 8.6*  HCT 32.6* 27.6*  MCV 106.5* 103.0*  PLT 161 143*    Recent Labs  12/03/12 1153 12/03/12 1916 12/03/12 2319 12/04/12 0447  TROPONINI <0.30 <0.30 <0.30 <0.30    Recent Labs  12/03/12 0825  DDIMER 1.24*    Recent Labs  12/03/12 1920  HGBA1C 6.5*    Recent Labs  12/03/12 1916  CHOL 94  HDL 32*  LDLCALC 23  TRIG 213*  CHOLHDL 2.9    Recent Labs  12/03/12 1920  TSH 0.405    Recent Labs  12/04/12 0447  INR 1.10    Radiology/Studies: Dg Chest 2 View 12/03/2012   Findings: Left-sided pacemaker with two continuous leads overlies normal cardiac silhouette. There is mild interstitial edema pattern.  Underlying emphysematous change.  No focal infiltrate. No overt pulmonary edema.   IMPRESSION: 1.  Interstitial edema is increased. 2.  Emphysematous change.    Original Report Authenticated By: Genevive Bi, M.D.   Ct Angio  Chest Pe W/cm &/or Wo Cm 12/03/2012   Findings: There is no pleural effusion.  Diffuse, peripheral and basilar predominant interstitial reticulation is identified.  There is mild subpleural honeycombing within the lung bases.  No airspace consolidation or atelectasis identified.  The heart size appears normal.  There is no pericardial effusion. The main pulmonary artery and its branches are patent.  There is no evidence for pulmonary embolus.  No enlarged mediastinal or hilar lymph nodes.  There is a left chest wall pacer device identified.  Leads are in the right atrial appendage and right ventricle.  No axillary or supraclavicular adenopathy.  There is a moderate-sized hiatal hernia.  No acute findings noted within the upper abdomen.   IMPRESSION:  1.  Exam negative for acute pulmonary embolus. 2.  Chronic interstitial lung disease.  Findings likely due to  usual interstitial pneumonitis.   Original Report Authenticated By: Signa Kell, M.D.    Echocardiogram: Study Conclusions - Left ventricle: The cavity size was normal. There was mild focal basal hypertrophy of the septum. Systolic function was normal. The estimated ejection fraction was in the range of 50% to 55%. Wall motion was normal; there were no regional wall motion abnormalities. Doppler parameters are consistent with abnormal left ventricular relaxation (grade 1 diastolic dysfunction). - Ventricular septum: Septal motion showed dyssynergy. These changes are consistent with right ventricular pacing. - Mitral valve: Mild regurgitation. - Left atrium: The atrium was mildly dilated. - Pericardium, extracardiac: A trivial pericardial effusion was identified.   Telemetry: SR, rare PACs; no arrhythmias    Assessment and Plan  1. Chest pain - worrisome for [place]; CEs negative; CT angio of chest negative for PE; still not completely pain free; Dr. Graciela Husbands has recommended Myoview today  2. Known CAD 3. Bradycardia s/p PPM implant 4. CKD 5. HTN 6. Anemia  Dr. Graciela Husbands to see Signed, Rick Duff PA-C  History and all data above reviewed.  Patient examined.  I agree with the findings as above.  The patient exam reveals COR:RRR  ,  Lungs: Scattered dry crackles  ,  Abd: Positive bowel sounds, no rebound no guarding, Ext No edema  .  All available labs, radiology testing, previous records reviewed. Agree with documented assessment and plan. Chest pain:  None last night.  Results of stress perfusion study pending.  Further work up pending these results.    [name]  12:29 PM  12/05/2012

## 2012-12-05 NOTE — Progress Notes (Signed)
Transferred to 3w07 by wheelchair , stable. Belongings with pt. Report given to RN.

## 2012-12-05 NOTE — Progress Notes (Signed)
[facility] Progress Note [place] TEAM 1 - Stepdown/ICU TEAM   [name] ZOX:[id number] DOB: 28-Jun-1923 DOA: 12/03/2012 PCP: Danise Edge, MD  Brief narrative: 79 year-old female with history of hypertension, hyperlipidemia, CAD status post cardiac catheterization in June of 2009 with a drug-eluting stent to the ostium of diagonal, negative stress test in May of 2013, history of symptomatic bradycardia with Mobitz 2 AV block status post Medtronic dual lead permanent pacemaker, followed by Dr. Graciela Husbands. She also has history of interstitial pulmonary fibrosis, myelodysplasia with chronic anemia, chronic kidney disease stage III, diet-controlled diabetes, hypothyroidism, acid reflux with history of gastric erosions, and bladder cancer.  She was at her baseline health one month before admit when she had a fall resulting in injury to her neck. She had been less mobile as a result. For 2 weeks she had progressive shortness of breath, increased fatigue, and mild cough. She denied chest pain or shortness of breath. The morning of her admit she awoke with 10 out of 10 substernal chest pressure. "I felt like someone was standing on my chest." The pain was associated with shortness of breath, clamminess, mild nausea. She had the pain for several hours before she called her son for assistance. She presented to [facility] with ongoing chest pain.  She was given aspirin 324 mg, nitroglycerin, normal saline, oxygen and her chest pain was somewhat reduced from 10 to 4. Her first troponin was negative, her EKG demonstrated an atrial paced rhythm however the axis had changed.  Chest x-ray demonstrated interstitial edema, and CT angiogram of the chest demonstrated interstitial pulmonary fibrosis without evidence of pulmonary embolism. She was transferred to [facility] for ongoing chest pain.   Assessment/Plan:  Chest pain troponins negative - CT angio negative for PE - sx worrisome for [place] -  Cardiology is directing w/u-stress Myoview in process- also has component of CW pain  CAD 10/2007 PCI Diag w/ 2.25 x 8mm Taxus Atom DES - 09/2011 Lexi MV: EF 69%, no ischemia/infarct  Chronic renal insufficiency - GFR ~40 Baseline crt appears to have ranged from 1.0 - 1.5 thus far during 2014 - follow trend   HYPOTHYROIDISM TSH is within goal range for replacement tx  HYPERLIPIDEMIA Continue lipitor  HYPERTENSION BP is currently well controlled  PVD  Myelodysplastic syndrome / Macrocytic anemia Baseline Hgb appears to be ~9 - follow trend - "characterized by chronic, macrocytic anemia, mild leukocytosis, and borderline thrombocytopenia" per Dr. Cyndie Chime - has required Q 3 week Aranesp injections  Diabetes mellitus CBG reasonably controlled - A1c 6.5  Recurrent UTI UA abnormal this admit, but culture unrevealing - may simply be colonized - review of old cultures reveals no recurring offending pathogen - if WBC remains elevated, or if pt develops fever, will need to consider changing to different abx (resistance to cipro is common in our community)  Anxiety and depression Appears well compensated at this time   Falls Will need PT/OT eval after medically stable   Code Status: FULL Family Communication: spoke w/ pt and daughter at bedside Disposition Plan: Transfer to telemetry  Consultants: Cardiology - [place]  Procedures: TTE - 7/20 - pending  Antibiotics: Oral cipro 7/20>>  DVT prophylaxis: IV heparin  HPI/Subjective: Pt is resting in bed. No further CP endorsed. Denied CP during Myovoew study   Objective: Blood pressure 133/66, pulse 93, temperature 98.5 F (36.9 C), temperature source Oral, resp. rate 21, height 5' (1.524 m), weight 42.2 kg (93 lb 0.6 oz), SpO2  100.00%.  Intake/Output Summary (Last 24 hours) at 12/05/12 1300 Last data filed at 12/04/12 2100  Gross per 24 hour  Intake    340 ml  Output      0 ml  Net    340 ml     Exam: General:  No acute respiratory distress at rest Lungs: CTA, RA - no wheeze  Cardiovascular: Regular rate and rhythm without murmur gallop or rub normal S1 and S2 Abdomen: Nontender, nondistended, soft, bowel sounds positive, no rebound, no ascites, no appreciable mass Extremities: No significant cyanosis, clubbing, or edema bilateral lower extremities Other: Chest wall pain reproducible with palpation  Data Reviewed: Basic Metabolic Panel:  Recent Labs Lab 12/03/12 0825 12/04/12 0447 12/05/12 0405  NA 140 137 139  K 3.9 3.6 4.2  CL 101 99 101  CO2 27 30 32  GLUCOSE 256* 146* 125*  BUN 19 21 22   CREATININE 1.20* 1.26* 1.35*  CALCIUM 9.7 9.2 9.4   Liver Function Tests:  Recent Labs Lab 12/03/12 0825  AST 31  ALT 24  ALKPHOS 58  BILITOT 0.4  PROT 7.1  ALBUMIN 3.0*   CBC:  Recent Labs Lab 12/03/12 0825 12/04/12 0447  WBC 16.0* 13.4*  NEUTROABS 13.9*  --   HGB 10.0* 8.6*  HCT 32.6* 27.6*  MCV 106.5* 103.0*  PLT 161 143*   Cardiac Enzymes:  Recent Labs Lab 12/03/12 0825 12/03/12 1153 12/03/12 1916 12/03/12 2319 12/04/12 0447  TROPONINI <0.30 <0.30 <0.30 <0.30 <0.30   BNP (last 3 results)  Recent Labs  04/20/12 1226 12/03/12 0825  PROBNP 113.0* 2120.0*   CBG:  Recent Labs Lab 12/04/12 0757 12/04/12 1124 12/04/12 1657 12/04/12 2202 12/05/12 0746  GLUCAP 162* 135* 242* 119* 135*    Recent Results (from the past 240 hour(s))  URINE CULTURE     Status: None   Collection Time    12/03/12 10:44 AM      Result Value Range Status   Specimen Description URINE, RANDOM   Final   Special Requests NONE   Final   Culture  Setup Time 12/03/2012 16:48   Final   Colony Count NO GROWTH   Final   Culture NO GROWTH   Final   Report Status 12/04/2012 FINAL   Final  MRSA PCR SCREENING     Status: None   Collection Time    12/03/12  4:43 PM      Result Value Range Status   MRSA by PCR NEGATIVE  NEGATIVE Final   Comment:            The GeneXpert MRSA Assay (FDA      approved for NASAL specimens     only), is one component of a     comprehensive MRSA colonization     surveillance program. It is not     intended to diagnose MRSA     infection nor to guide or     monitor treatment for     MRSA infections.     Studies:  Recent x-ray studies have been reviewed in detail by the Attending Physician  Scheduled Meds:  Scheduled Meds: . aspirin EC  325 mg Oral Daily  . atorvastatin  40 mg Oral q1800  . bacitracin-polymyxin b   Left Eye Q12H  . ciprofloxacin  250 mg Oral Q24H  . enoxaparin (LOVENOX) injection  40 mg Subcutaneous Q24H  . feeding supplement  237 mL Oral BID BM  . insulin aspart  0-9 Units Subcutaneous TID WC  .  levothyroxine  112 mcg Oral QAC breakfast  . nitroGLYCERIN  0.5 inch Topical Q6H  . pantoprazole  40 mg Oral Daily  . PARoxetine  20 mg Oral Daily  . sodium chloride  3 mL Intravenous Q12H  . vitamin B-12  1,000 mcg Oral Daily    Time spent on care of this patient:   ELLIS,ALLISON L. ANP  [facility] Office  [phone] Pager - Text Page per Loretha Stapler as per below:  On-Call/Text Page:      [email or web address]      password [email or web address]  If 7PM-7AM, please contact night-coverage [email or web address] Password [email or web address] 12/05/2012, 1:00 PM   LOS: 2 days      I have examined the patient, reviewed the chart and modified the above note which I agree with.   Elishia Kaczorowski,MD [phone] 12/05/2012, 2:08 PM

## 2012-12-06 DIAGNOSIS — D72829 Elevated white blood cell count, unspecified: Secondary | ICD-10-CM

## 2012-12-06 DIAGNOSIS — D469 Myelodysplastic syndrome, unspecified: Secondary | ICD-10-CM

## 2012-12-06 MED ORDER — CIPROFLOXACIN HCL 250 MG PO TABS: 250.0000 mg | ORAL_TABLET | ORAL | Status: DC

## 2012-12-06 MED ORDER — PANTOPRAZOLE SODIUM 40 MG PO TBEC: 40.0000 mg | DELAYED_RELEASE_TABLET | Freq: Every day | ORAL | Status: DC

## 2012-12-06 MED ORDER — ENSURE COMPLETE PO LIQD: 237.0000 mL | Freq: Two times a day (BID) | ORAL | Status: DC

## 2012-12-06 NOTE — Discharge Summary (Signed)
Physician Discharge Summary  [name] ZOX:[id number] DOB: 04/17/1924 DOA: 12/03/2012  PCP: Danise Edge, MD  Admit date: 12/03/2012 Discharge date: 12/06/2012  Time spent:>30 minutes  Recommendations for Outpatient Follow-up:      Follow-up Information   Follow up with Danise Edge, MD. (in 1-2weeks, call for appt upon discharge)    Contact information:   [address] [facility] [address] [place] [address] [phone]       Follow up with Sherryl Manges, MD In 2 weeks. (call for appt upon discharge)    Contact information:   [address] [place] [address] [phone]     Followup labs at office -CBC to followup on white cell count  Discharge Diagnoses:  Principal Problem:   Chest pain Active Problems:   HYPOTHYROIDISM   HYPERLIPIDEMIA-MIXED   HYPERTENSION, UNSPECIFIED   PVD   HYPOTENSION, ORTHOSTATIC   Myelodysplastic syndrome   CAD   Diabetes mellitus   Recurrent UTI   Macrocytic anemia   Anxiety and depression   Falls   Conjunctivitis unspecified   Leukocytosis, unspecified   Skin lesion of hand   Protein-calorie malnutrition, severe    Discharge Condition: improved/stable  Diet recommendation: heart healthy  Filed Weights   12/05/12 0626 12/05/12 1825 12/06/12 0538  Weight: 42.2 kg (93 lb 0.6 oz) 44.3 kg (97 lb 10.6 oz) 44.6 kg (98 lb 5.2 oz)    History of present illness:  The patient is a 79 y.o. year-old female with history of hypertension, hyperlipidemia, CAD status post cardiac catheterization in June of 2009 with a drug-eluting stent to the ostium of diagonal, negative stress test in May of 2013, history of symptomatic bradycardia with Mobitz 2 AV block status post Medtronic dual lead permanent pacemaker, followed by Dr. Graciela Husbands. She also has history of interstitial pulmonary fibrosis, myelodysplasia with chronic anemia, chronic kidney disease stage III, diet-controlled diabetes, hypothyroidism, acid reflux with history of gastric  erosions, bladder cancer.  She was at her baseline health proximally month ago when she had a fall resulting in injury to her neck. She has been less mobile for the last month as a result. For the last 2 weeks she has had progressive shortness of breath, increased fatigue, and mild cough over the last 2 days. She denies any chest pains or shortness of breath with exertion, but cannot ambulate far. This morning around 3 AM she awoke with 10 out of 10 substernal chest pressure. "I felt like someone was standing on my chest." The pain was associated with shortness of breath, clamminess, mild nausea. She has baseline lightheadedness. She had the pain for several hours before she called her son for assistance. [facility] with [facility].  In the urgent care, she was given aspirin 324 mg, nitroglycerin, normal saline, oxygen and her chest pain was somewhat reduced from 10 to 4. Her first troponin was negative, her EKG demonstrates an atrial paced rhythm however the axis has changed, chest x-ray demonstrated interstitial edema, and CT angiogram of the chest demonstrated interstitial pulmonary fibrosis without evidence of pulmonary embolism. She was transferred to [facility] for ongoing chest pain.   Hospital Course:  Chest pain  As discussed above, upon admission cardiac enzymes were cycled and came back negative. A CT angiogram of chest was negative for PE. A 2-D echo showed an EF of 50-55% with no wall motion abnormality. Stress Myoview on 7/22 and showed no reversible or irreversible defects to suggest ischemia  or infarct. Ejection fraction 84%. Patient remained chest pain-free overnight. Cardiology/Dr. Patty Sermons followed up with patient today and from a cardiac standpoint he recommends discharge home. It was also noted that she had reproducible chest wall painShe was treated with analgesics. She does have a history of GERD and was placed on a PPI during this hospital stay  which she is to continue upon discharge. Etiology of his chest pain(normal resolved) possibly GERD versus musculoskeletal.  CAD  10/2007 PCI Diag w/ 2.25 x 8mm Taxus Atom DES - 09/2011 Lexi MV: EF 69%, no ischemia/infarct, we see above for evaluation during this hospitalization. Chronic renal insufficiency - GFR ~40  Baseline crt appears to have ranged from 1.0 - 1.5 thus far during 2014 -remained stable during this hospital stay. HYPOTHYROIDISM  TSH is within goal range for replacement tx. she is to continue Synthroid upon discharge. HYPERLIPIDEMIA  Continue lipitor  HYPERTENSION  BP is currently well controlled. PVD  Myelodysplastic syndrome / Macrocytic anemia  Baseline Hgb appears to be ~9 - follow trend - "characterized by chronic, macrocytic anemia, mild leukocytosis, and borderline thrombocytopenia" per Dr. Cyndie Chime - has required Q 3 week Aranesp injections  Diabetes mellitus  CBG reasonably controlled - A1c 6.5  Recurrent UTI  UA abnormal this admit, but culture unrevealing - may simply be colonized - review of old cultures reveals no recurring offending pathogen, the patient was noted to have an elevated white cell count of 13. Her urine cultures to date showed no growth and she has remained afebrile and hemodynamically stable. Should be discharged on oral Cipro and is to followup with her PCP for recheck wbc. She also has a history of myelodysplastic syndrome as above which may be the etiology of the mild leukocytosis.   Anxiety and depression  Appears well compensated at this time. She is to continue her Paxil  Falls  Patient was evaluated by physical therapy while in the hospital and no PT follow up recommended.  Consultants:  Cardiology - [place]/Dr. Patty Sermons Procedures:  TTE - 7/21 Study Conclusions  - Left ventricle: The cavity size was normal. There was mild focal basal hypertrophy of the septum. Systolic function was normal. The estimated ejection fraction was in  the range of 50% to 55%. Wall motion was normal; there were no regional wall motion abnormalities. Doppler parameters are consistent with abnormal left ventricular relaxation (grade 1 diastolic dysfunction). - Ventricular septum: Septal motion showed dyssynergy. These changes are consistent with right ventricular pacing. - Mitral valve: Mild regurgitation. - Left atrium: The atrium was mildly dilated. - Pericardium, extracardiac: A trivial pericardial effusion was identified.    Discharge Exam: Filed Vitals:   12/05/12 1621 12/05/12 1825 12/05/12 1959 12/06/12 0538  BP: 110/46 115/65 110/52 138/65  Pulse: 82 77 72 66  Temp: 98.7 F (37.1 C) 98.8 F (37.1 C) 98.8 F (37.1 C) 98 F (36.7 C)  TempSrc: Oral Oral Oral Oral  Resp: 22 18    Height:  5' (1.524 m)    Weight:  44.3 kg (97 lb 10.6 oz)  44.6 kg (98 lb 5.2 oz)  SpO2: 100% 99% 99% 97%   Exam:  General: No acute respiratory distress at rest  Lungs: CTA, RA - no wheeze  Cardiovascular: Regular rate and rhythm without murmur gallop or rub normal S1 and S2  Abdomen: Nontender, nondistended, soft, bowel sounds positive, no rebound, no ascites, no appreciable mass  Extremities: No significant cyanosis, clubbing, or edema bilateral lower extremities    Discharge Instructions  Discharge Orders   Future Appointments Provider Department Dept Phone   12/11/2012 11:00 AM Dava Najjar [facility] MED[facility] [phone]   12/11/2012 11:30 AM [facility]Medon[facility] Inj Nurse [facility] [facility] MED[facility]COL[facility] [phone]   12/11/2012 2:15 PM L[facility]-Hp Nurse [facility] [phone]   12/25/2012 10:00 AM [facility]Hp Nurse [facility] [phone]   01/01/2013 11:00 AM Beverely Pace [facility] [place] [facility] MED[facility] ONCOL[facility] [phone]   01/01/2013 11:30 AM [facility] Inj Nurse [place] [facility] MED[facility] ONCOL[facility] [phone]   01/22/2013 11:45 AM Krista Blue  [facility] MEDICAL ONCOLOGY [phone]   01/22/2013 12:15 PM Rana Snare, NP [facility] MEDICAL ONCOLOGY [phone]   01/22/2013 12:45 PM [facility]Medon[facility] Inj Nurse [place] [facility] MED[facility] ONCOL[facility]Y [phone]   01/29/2013 10:30 AM Lbpc-Hp Nurse [facility]  [facility] [phone]   02/12/2013 11:00 AM Delcie Roch [facility] MEDICAL ONCOL[facility]Y [phone]   02/12/2013 11:30 AM [facility] Inj Nurse [place] [facility] MED[facility] ONCOL[facility] [phone]   03/05/2013 11:00 AM [facility] MED[facility] ONCOL[facility] [phone]   03/05/2013 11:30 AM [facility] Inj Nurse [place] [facility] MED[facility] ONCOL[facility] [phone]   Future Orders Complete By Expires     Diet - low sodium heart healthy  As directed     Increase activity slowly  As directed         Medication List         acetaminophen 500 MG tablet  Commonly known as:  TYLENOL  Take 500 mg by mouth daily as needed. For pain     aspirin EC 81 MG tablet  Take 81 mg by mouth daily.     bacitracin-polymyxin b ophthalmic ointment  Commonly known as:  POLYSPORIN  Place into the left eye every 12 (twelve) hours. apply to eye every 12 hours while awake     ciprofloxacin 250 MG tablet  Commonly known as:  CIPRO  Take 1 tablet (250 mg total) by mouth daily.     feeding supplement Liqd  Take 237 mLs by mouth 2 (two) times daily between meals.     levothyroxine 112 MCG tablet  Commonly known as:  SYNTHROID, LEVOTHROID  Take 1 tablet (112 mcg total) by mouth daily.     pantoprazole 40 MG tablet  Commonly known as:  PROTONIX  Take 1 tablet (40 mg total) by mouth daily.     PARoxetine 20 MG tablet  Commonly known as:  PAXIL  Take 1 tablet (20 mg total) by mouth every morning.     rosuvastatin 20 MG tablet  Commonly known as:  CRESTOR  Take 1 tablet (20 mg total) by mouth at bedtime.     vitamin B-12 1000 MCG tablet  Commonly known as:   CYANOCOBALAMIN  Take 1 tablet (1,000 mcg total) by mouth daily. Prefer a sublingual preparation       Allergies  Allergen Reactions  . Codeine Anaphylaxis and Swelling  . Penicillins Anaphylaxis and Swelling  . Sulfonamide Derivatives Anaphylaxis and Swelling       Follow-up Information   Follow up with Danise Edge, MD. (in 1-2weeks, call for appt upon discharge)    Contact information:   [address] [facility] [address] [place] [address] [phone]       Follow up with Sherryl Manges, MD In 2 weeks. (call for appt upon discharge)    Contact  information:   [address] [place] [address] [phone]        The results of significant diagnostics from this hospitalization (including imaging, microbiology, ancillary and laboratory) are listed below for reference.    Significant Diagnostic Studies: Dg Chest 2 View  12/03/2012   *RADIOLOGY REPORT*  Clinical Data: Short of breath, chest pain  CHEST - 2 VIEW  Comparison: Pain chest radiograph 09/04/2012  Findings: Left-sided pacemaker with two continuous leads overlies normal cardiac silhouette. There is mild interstitial edema pattern.  Underlying emphysematous change.  No focal infiltrate. No overt pulmonary edema.  IMPRESSION: 1.  Interstitial edema is increased. 2.  Emphysematous change.   Original Report Authenticated By: Genevive Bi, M.D.   Ct Angio Chest Pe W/cm &/or Wo Cm  12/03/2012   *RADIOLOGY REPORT*  Clinical Data: Chest pain.  CT ANGIOGRAPHY CHEST  Technique:  Multidetector CT imaging of the chest using the standard protocol during bolus administration of intravenous contrast. Multiplanar reconstructed images including MIPs were obtained and reviewed to evaluate the vascular anatomy.  Contrast: OMNIPAQUE IOHEXOL 350 MG/ML SOLN  Comparison: 12/10/2007  Findings: There is no pleural effusion.  Diffuse, peripheral and basilar predominant interstitial reticulation is identified.  There is mild  subpleural honeycombing within the lung bases.  No airspace consolidation or atelectasis identified.  The heart size appears normal.  There is no pericardial effusion. The main pulmonary artery and its branches are patent.  There is no evidence for pulmonary embolus.  No enlarged mediastinal or hilar lymph nodes.  There is a left chest wall pacer device identified.  Leads are in the right atrial appendage and right ventricle.  No axillary or supraclavicular adenopathy.  There is a moderate-sized hiatal hernia.  No acute findings noted within the upper abdomen.  IMPRESSION:  1.  Exam negative for acute pulmonary embolus. 2.  Chronic interstitial lung disease.  Findings likely due to usual interstitial pneumonitis.   Original Report Authenticated By: Signa Kell, M.D.   Nm Myocar Multi W/spect W/wall Motion / Ef  12/05/2012   *RADIOLOGY REPORT*  Clinical Data:  Chest pain  MYOCARDIAL IMAGING WITH SPECT (REST AND PHARMACOLOGIC-STRESS) GATED LEFT VENTRICULAR WALL MOTION STUDY LEFT VENTRICULAR EJECTION FRACTION  Technique:  Standard myocardial SPECT imaging was performed after resting intravenous injection of 10 mCi Tc-13m sestamibi. Subsequently, intravenous infusion of Lexiscan was performed under the supervision of the Cardiology staff.  At peak effect of the drug, 30 mCi Tc-62m sestamibi was injected intravenously and standard myocardial SPECT  imaging was performed.  Quantitative gated imaging was also performed to evaluate left ventricular wall motion, and estimate left ventricular ejection fraction.  Comparison:  01/13/2011  Findings: SPECT imaging demonstrates no reversible or irreversible defects to suggest ischemia or infarct.  Quantitative gated analysis shows normal wall motion.  The resting left ventricular ejection fraction is 84% with end- diastolic volume of 37 ml and end-systolic volume of 6 ml.  IMPRESSION: Unremarkable study.  Ejection fraction 84%.   Original Report Authenticated By: Charlett Nose,  M.D.    Microbiology: Recent Results (from the past 240 hour(s))  URINE CULTURE     Status: None   Collection Time    12/03/12 10:44 AM      Result Value Range Status   Specimen Description URINE, RANDOM   Final   Special Requests NONE   Final   Culture  Setup Time 12/03/2012 16:48   Final   Colony Count NO GROWTH   Final  Culture NO GROWTH   Final   Report Status 12/04/2012 FINAL   Final  MRSA PCR SCREENING     Status: None   Collection Time    12/03/12  4:43 PM      Result Value Range Status   MRSA by PCR NEGATIVE  NEGATIVE Final   Comment:            The GeneXpert MRSA Assay (FDA     approved for NASAL specimens     only), is one component of a     comprehensive MRSA colonization     surveillance program. It is not     intended to diagnose MRSA     infection nor to guide or     monitor treatment for     MRSA infections.     Labs: Basic Metabolic Panel:  Recent Labs Lab 12/03/12 0825 12/04/12 0447 12/05/12 0405  NA 140 137 139  K 3.9 3.6 4.2  CL 101 99 101  CO2 27 30 32  GLUCOSE 256* 146* 125*  BUN 19 21 22   CREATININE 1.20* 1.26* 1.35*  CALCIUM 9.7 9.2 9.4   Liver Function Tests:  Recent Labs Lab 12/03/12 0825  AST 31  ALT 24  ALKPHOS 58  BILITOT 0.4  PROT 7.1  ALBUMIN 3.0*   No results found for this basename: LIPASE, AMYLASE,  in the last 168 hours No results found for this basename: AMMONIA,  in the last 168 hours CBC:  Recent Labs Lab 12/03/12 0825 12/04/12 0447  WBC 16.0* 13.4*  NEUTROABS 13.9*  --   HGB 10.0* 8.6*  HCT 32.6* 27.6*  MCV 106.5* 103.0*  PLT 161 143*   Cardiac Enzymes:  Recent Labs Lab 12/03/12 0825 12/03/12 1153 12/03/12 1916 12/03/12 2319 12/04/12 0447  TROPONINI <0.30 <0.30 <0.30 <0.30 <0.30   BNP: BNP (last 3 results)  Recent Labs  04/20/12 1226 12/03/12 0825  PROBNP 113.0* 2120.0*   CBG:  Recent Labs Lab 12/05/12 0746 12/05/12 1334 12/05/12 1620 12/05/12 2147 12/06/12 0725  GLUCAP 135*  169* 132* 152* 140*       Signed:  Nazaria Riesen C  [facility] 12/06/2012, 11:20 AM

## 2012-12-06 NOTE — Progress Notes (Signed)
D/c orders received;IV removed with gauze on, pt remains in stable condition, pt meds and instructions reviewed and given to pt; pt d/c to home 

## 2012-12-06 NOTE — Progress Notes (Signed)
Subjective:  Patient feels well this am. No chest pain overnight. Myoview normal.  Objective:  Vital Signs in the last 24 hours: Temp:  [98 F (36.7 C)-98.8 F (37.1 C)] 98 F (36.7 C) (07/23 0538) Pulse Rate:  [66-94] 66 (07/23 0538) Resp:  [16-22] 18 (07/22 1825) BP: (110-144)/(46-69) 138/65 mmHg (07/23 0538) SpO2:  [97 %-100 %] 97 % (07/23 0538) Weight:  [97 lb 10.6 oz (44.3 kg)-98 lb 5.2 oz (44.6 kg)] 98 lb 5.2 oz (44.6 kg) (07/23 0538)  Intake/Output from previous day: 07/22 0701 - 07/23 0700 In: 203 [P.O.:200; I.V.:3] Out: -  Intake/Output from this shift:    . aspirin EC  325 mg Oral Daily  . atorvastatin  40 mg Oral q1800  . bacitracin-polymyxin b   Left Eye Q12H  . ciprofloxacin  250 mg Oral Q24H  . enoxaparin (LOVENOX) injection  40 mg Subcutaneous Q24H  . feeding supplement  237 mL Oral BID BM  . insulin aspart  0-9 Units Subcutaneous TID WC  . levothyroxine  112 mcg Oral QAC breakfast  . nitroGLYCERIN  0.5 inch Topical Q6H  . pantoprazole  40 mg Oral Daily  . PARoxetine  20 mg Oral Daily  . sodium chloride  3 mL Intravenous Q12H  . vitamin B-12  1,000 mcg Oral Daily      Physical Exam: The patient appears to be in no distress.  Head and neck exam reveals that the pupils are equal and reactive.  The extraocular movements are full.  There is no scleral icterus.  Mouth and pharynx are benign.  No lymphadenopathy.  No carotid bruits.  The jugular venous pressure is normal.  Thyroid is not enlarged or tender.  Chest is clear to percussion and auscultation.  No rales or rhonchi.  Expansion of the chest is symmetrical. Pacemaker in left upper chest.  Heart reveals no abnormal lift or heave.  First and second heart sounds are normal.  There is no murmur gallop rub or click.  The abdomen is soft and nontender.  Bowel sounds are normoactive.  There is no hepatosplenomegaly or mass.  There are no abdominal bruits.  Extremities reveal no phlebitis or edema.   Pedal pulses are good.  There is no cyanosis or clubbing.  Neurologic exam is normal strength and no lateralizing weakness.  No sensory deficits.  Integument reveals no rash  Lab Results:  Recent Labs  12/03/12 0825 12/04/12 0447  WBC 16.0* 13.4*  HGB 10.0* 8.6*  PLT 161 143*    Recent Labs  12/04/12 0447 12/05/12 0405  NA 137 139  K 3.6 4.2  CL 99 101  CO2 30 32  GLUCOSE 146* 125*  BUN 21 22  CREATININE 1.26* 1.35*    Recent Labs  12/03/12 2319 12/04/12 0447  TROPONINI <0.30 <0.30   Hepatic Function Panel  Recent Labs  12/03/12 0825  PROT 7.1  ALBUMIN 3.0*  AST 31  ALT 24  ALKPHOS 58  BILITOT 0.4    Recent Labs  12/03/12 1916  CHOL 94   No results found for this basename: PROTIME,  in the last 72 hours  Imaging: Nm Myocar Multi W/spect W/wall Motion / Ef  12/05/2012   *RADIOLOGY REPORT*  Clinical Data:  Chest pain  MYOCARDIAL IMAGING WITH SPECT (REST AND PHARMACOLOGIC-STRESS) GATED LEFT VENTRICULAR WALL MOTION STUDY LEFT VENTRICULAR EJECTION FRACTION  Technique:  Standard myocardial SPECT imaging was performed after resting intravenous injection of 10 mCi Tc-71m sestamibi. Subsequently, intravenous infusion of Lexiscan  was performed under the supervision of the Cardiology staff.  At peak effect of the drug, 30 mCi Tc-60m sestamibi was injected intravenously and standard myocardial SPECT  imaging was performed.  Quantitative gated imaging was also performed to evaluate left ventricular wall motion, and estimate left ventricular ejection fraction.  Comparison:  01/13/2011  Findings: SPECT imaging demonstrates no reversible or irreversible defects to suggest ischemia or infarct.  Quantitative gated analysis shows normal wall motion.  The resting left ventricular ejection fraction is 84% with end- diastolic volume of 37 ml and end-systolic volume of 6 ml.  IMPRESSION: Unremarkable study.  Ejection fraction 84%.   Original Report Authenticated By: Charlett Nose,  M.D.    Cardiac Studies: Telemetry shows AV paced rhythm Assessment/Plan:  1. Chest pain - No evidence of ischemia on Myoview. Troponins normal. 2. Known CAD  3. Bradycardia s/p PPM implant  4. CKD  5. HTN  6. Anemia  Plan: okay for discharge today from cardiac standpoint.  LOS: 3 days    [name] 12/06/2012, 7:34 AM

## 2012-12-11 ENCOUNTER — Other Ambulatory Visit (HOSPITAL_BASED_OUTPATIENT_CLINIC_OR_DEPARTMENT_OTHER): Payer: Medicare Other | Admitting: Lab

## 2012-12-11 ENCOUNTER — Telehealth: Payer: Self-pay

## 2012-12-11 ENCOUNTER — Ambulatory Visit (INDEPENDENT_AMBULATORY_CARE_PROVIDER_SITE_OTHER): Payer: Medicare Other

## 2012-12-11 ENCOUNTER — Ambulatory Visit (HOSPITAL_BASED_OUTPATIENT_CLINIC_OR_DEPARTMENT_OTHER): Payer: Medicare Other

## 2012-12-11 VITALS — BP 110/51 | HR 82 | Temp 98.2°F

## 2012-12-11 DIAGNOSIS — D649 Anemia, unspecified: Secondary | ICD-10-CM

## 2012-12-11 DIAGNOSIS — D469 Myelodysplastic syndrome, unspecified: Secondary | ICD-10-CM

## 2012-12-11 DIAGNOSIS — E538 Deficiency of other specified B group vitamins: Secondary | ICD-10-CM

## 2012-12-11 LAB — CBC WITH DIFFERENTIAL/PLATELET
Basophils Absolute: 0 10*3/uL (ref 0.0–0.1)
EOS%: 1.5 % (ref 0.0–7.0)
LYMPH%: 6.4 % — ABNORMAL LOW (ref 14.0–49.7)
MCH: 32 pg (ref 25.1–34.0)
MCV: 100.5 fL (ref 79.5–101.0)
MONO%: 6.2 % (ref 0.0–14.0)
RBC: 3.04 10*6/uL — ABNORMAL LOW (ref 3.70–5.45)
RDW: 16.1 % — ABNORMAL HIGH (ref 11.2–14.5)

## 2012-12-11 LAB — MORPHOLOGY

## 2012-12-11 MED ORDER — CYANOCOBALAMIN 1000 MCG/ML IJ SOLN
1000.0000 ug | Freq: Once | INTRAMUSCULAR | Status: AC
  Administered 2012-12-11: 1000 ug via INTRAMUSCULAR

## 2012-12-11 MED ORDER — DARBEPOETIN ALFA-POLYSORBATE 300 MCG/0.6ML IJ SOLN
300.0000 ug | Freq: Once | INTRAMUSCULAR | Status: AC
  Administered 2012-12-11: 300 ug via SUBCUTANEOUS
  Filled 2012-12-11: qty 0.6

## 2012-12-11 NOTE — Telephone Encounter (Signed)
[name] with [name] left a message stating that pt is getting PT and OT with [name] and they are getting close to releasing her but pt called there office today to see if they were going to continue.  If [name] needs to continue with the OT and PT they need a verbal order.  Please advise?

## 2012-12-11 NOTE — Telephone Encounter (Signed)
Please clarify with [name] if they think they can justify continuing PT and/or OT, I am happy to give the order if they think it will help

## 2012-12-11 NOTE — Progress Notes (Signed)
  Subjective:    Patient ID: [name], female    DOB: April 20, 1924, 79 y.o.   MRN: [id number]  HPI    Review of Systems     Objective:   Physical Exam        Assessment & Plan:   Patient came in today for her B12 injection. Pt tolerated injection well

## 2012-12-12 ENCOUNTER — Encounter: Payer: Self-pay | Admitting: Family Medicine

## 2012-12-12 NOTE — Telephone Encounter (Signed)
I spoke to [place] at [facility] and she states that she is helping out another team and has not actually met the patient. Joyce Gross is going to get ahold of the therapist to see if they can come to a conclussion if the PT and/or OT will help.  Joyce Gross will give [name] a call back

## 2012-12-13 ENCOUNTER — Ambulatory Visit: Payer: Medicare Other | Admitting: Family Medicine

## 2012-12-15 ENCOUNTER — Ambulatory Visit: Payer: Medicare Other | Admitting: Family Medicine

## 2012-12-18 ENCOUNTER — Encounter: Payer: Self-pay | Admitting: Family

## 2012-12-18 ENCOUNTER — Ambulatory Visit (INDEPENDENT_AMBULATORY_CARE_PROVIDER_SITE_OTHER): Payer: Medicare Other | Admitting: Family

## 2012-12-18 VITALS — BP 116/54 | HR 90 | Temp 98.1°F | Ht 60.0 in | Wt 97.5 lb

## 2012-12-18 DIAGNOSIS — D649 Anemia, unspecified: Secondary | ICD-10-CM

## 2012-12-18 DIAGNOSIS — D539 Nutritional anemia, unspecified: Secondary | ICD-10-CM

## 2012-12-18 DIAGNOSIS — E119 Type 2 diabetes mellitus without complications: Secondary | ICD-10-CM

## 2012-12-18 DIAGNOSIS — R0789 Other chest pain: Secondary | ICD-10-CM

## 2012-12-18 LAB — CBC WITH DIFFERENTIAL/PLATELET
Basophils Absolute: 0 10*3/uL (ref 0.0–0.1)
Eosinophils Relative: 1 % (ref 0–5)
HCT: 29.8 % — ABNORMAL LOW (ref 36.0–46.0)
Hemoglobin: 9.9 g/dL — ABNORMAL LOW (ref 12.0–15.0)
Lymphocytes Relative: 9 % — ABNORMAL LOW (ref 12–46)
MCV: 98.7 fL (ref 78.0–100.0)
Monocytes Absolute: 1.3 10*3/uL — ABNORMAL HIGH (ref 0.1–1.0)
Monocytes Relative: 8 % (ref 3–12)
RDW: 15.7 % — ABNORMAL HIGH (ref 11.5–15.5)
WBC: 17.3 10*3/uL — ABNORMAL HIGH (ref 4.0–10.5)

## 2012-12-18 NOTE — Assessment & Plan Note (Signed)
Resolved, continue PPI.

## 2012-12-18 NOTE — Progress Notes (Signed)
Subjective:    Patient ID: [name], female    DOB: July 11, 1923, 79 y.o.   MRN: [id number]  HPI  [name] is an 79 yr old female who presents today for hospital follow up.  She presented to an urgent care on 7/20 with complaint of chest pain.  She has history of PVD, intersititial pulmonary fibrosis, anemia and CAD and was sent to the ED for further evaluation.  Hospitalization records are reviewed.  During her hospitalization she underwent serial cardiac enzymes which were negative. CTA chest was negative for PE. She also had a stress myoview which was negative for ischemic changes.  Her renal insufficiency and anemia were felt to be at baseline.  She was started on a PPI for possible GERD symptoms.  Her CP was ultimately felt to be atypical in nature and due possibly to GERD or musculoskeletal pain.  Since returning home her SOB is improved and she has had no further chest pain. She denies lower extremity swelling. She does report overall fatigue.  She reports that she has a poor appetite but is drinking up to 3 boosts a day in addition to her regular meals.    DM2- reports that her sugars at home have been stable.  She is diet controlled.    Review of Systems See HPI  Past Medical History  Diagnosis Date  . CAD (coronary artery disease)     a. 10/2007 PCI Diag w/ 2.25 x 8mm Taxus Atom DES.;  b. 09/2011 Lexi MV: EF 69%, no ischemia/infarct.  Marland Kitchen PVD (peripheral vascular disease)   . Carotid artery stenosis     Dr. Graciela Husbands  . HTN (hypertension)   . Hyperlipidemia   . Hypothyroidism   . CKD (chronic kidney disease), stage III   . Diabetes mellitus 07/16/2011  . Allergic state 07/16/2011  . Insomnia 07/16/2011  . Recurrent UTI 07/16/2011  . Asthma     childhood, recurrent at 62  . Myelodysplastic syndrome 04/02/2011  . Sinusitis 07/18/2011  . Anemia 07/18/2011  . Arthritis 07/18/2011  . Cataract extraction status of right eye 08/18/2011  . Anxiety and depression 08/18/2011  . Bronchitis, acute  12/20/2011  . Headache(784.0) 03/02/2012  . AV heart block     a. 10/2007 MDT Versa dual chamber PPM.  . Bladder cancer 12/2002  . Closed intertrochanteric fracture of left hip 05/27/2012  . Loss of weight 09/04/2012  . Renal failure, chronic 09/05/2012  . Conjunctivitis unspecified 11/21/2012  . Frequent falls     History   Social History  . Marital Status: Widowed    Spouse Name: N/A    Number of Children: N/A  . Years of Education: N/A   Occupational History  . Not on file.   Social History Main Topics  . Smoking status: Never Smoker   . Smokeless tobacco: Never Used  . Alcohol Use: No  . Drug Use: No  . Sexually Active: No   Other Topics Concern  . Not on file   Social History Narrative   Lives at home and uses a walker and has caretaker who comes almost every day.      Past Surgical History  Procedure Laterality Date  . Cholecystectomy    . Pcm - medtronic    . Abdominal hysterectomy    . Shoulder open rotator cuff repair    . Foot surgery      bunionectomy  . Intramedullary (im) nail intertrochanteric  05/27/2012    Procedure: INTRAMEDULLARY (IM) NAIL INTERTROCHANTRIC;  Surgeon: Eulas Post, MD;  Location: [facility] OR;  Service: Orthopedics;  Laterality: Left;  . Pacemaker insertion      Family History  Problem Relation Age of Onset  . Heart disease Mother     chf  . Arthritis Mother   . Arthritis Father   . Kidney disease Father   . Hypertension Father   . Cancer Brother     lung  . Arthritis Daughter     MVA with very serious injuries    Allergies  Allergen Reactions  . Codeine Anaphylaxis and Swelling  . Penicillins Anaphylaxis and Swelling  . Sulfonamide Derivatives Anaphylaxis and Swelling    Current Outpatient Prescriptions on File Prior to Visit  Medication Sig Dispense Refill  . acetaminophen (TYLENOL) 500 MG tablet Take 500 mg by mouth daily as needed. For pain      . aspirin EC 81 MG tablet Take 81 mg by mouth daily.      . feeding  supplement (ENSURE COMPLETE) LIQD Take 237 mLs by mouth 2 (two) times daily between meals.      Marland Kitchen levothyroxine (SYNTHROID, LEVOTHROID) 112 MCG tablet Take 1 tablet (112 mcg total) by mouth daily.  30 tablet  1  . pantoprazole (PROTONIX) 40 MG tablet Take 1 tablet (40 mg total) by mouth daily.  30 tablet  0  . PARoxetine (PAXIL) 20 MG tablet Take 1 tablet (20 mg total) by mouth every morning.  30 tablet  2  . rosuvastatin (CRESTOR) 20 MG tablet Take 1 tablet (20 mg total) by mouth at bedtime.  30 tablet  1  . vitamin B-12 (CYANOCOBALAMIN) 1000 MCG tablet Take 1 tablet (1,000 mcg total) by mouth daily. Prefer a sublingual preparation  30 tablet  2  . bacitracin-polymyxin b (POLYSPORIN) ophthalmic ointment Place into the left eye every 12 (twelve) hours. apply to eye every 12 hours while awake  3.5 g  1  . ciprofloxacin (CIPRO) 250 MG tablet Take 1 tablet (250 mg total) by mouth daily.  3 tablet  0   No current facility-administered medications on file prior to visit.    BP 116/54  Pulse 90  Temp(Src) 98.1 F (36.7 C) (Oral)  Ht 5' (1.524 m)  Wt 97 lb 8 oz (44.226 kg)  BMI 19.04 kg/m2  SpO2 92%       Objective:   Physical Exam  Constitutional: She is oriented to person, place, and time. No distress.  Thin, frail appearing elderly female in NAD.  HENT:  Head: Normocephalic and atraumatic.  Cardiovascular: Normal rate and regular rhythm.   No murmur heard. Pulmonary/Chest: Effort normal and breath sounds normal. No respiratory distress. She has no wheezes. She has no rales. She exhibits no tenderness.  Musculoskeletal: She exhibits no edema.  Neurological: She is alert and oriented to person, place, and time.  Skin: Skin is warm and dry.  Psychiatric: She has a normal mood and affect. Her behavior is normal. Judgment and thought content normal.          Assessment & Plan:

## 2012-12-18 NOTE — Assessment & Plan Note (Signed)
Continue monthly b12 injections, obtain follow up CBC.

## 2012-12-18 NOTE — Patient Instructions (Addendum)
Please complete your lab work prior to leaving. Follow up with Dr. Abner Greenspan in 2 months, sooner if problems/concerns.

## 2012-12-18 NOTE — Assessment & Plan Note (Signed)
Clinically stable.  A1C 6.5.  Monitor.

## 2012-12-19 ENCOUNTER — Other Ambulatory Visit: Payer: Self-pay | Admitting: *Deleted

## 2012-12-19 ENCOUNTER — Telehealth: Payer: Self-pay | Admitting: Family

## 2012-12-19 DIAGNOSIS — N390 Urinary tract infection, site not specified: Secondary | ICD-10-CM

## 2012-12-19 DIAGNOSIS — D72829 Elevated white blood cell count, unspecified: Secondary | ICD-10-CM

## 2012-12-19 NOTE — Addendum Note (Signed)
Addended by: Mervin Kung A on: 12/19/2012 04:30 PM   Modules accepted: Orders

## 2012-12-19 NOTE — Telephone Encounter (Signed)
Please call pt and let her know blood count is stable.  White blood cell count is elevated but has come down a bit since she was in the hospital.  I would like her to submit a urine culture please so we can rule out urinary tract infection.  Also, I would like her to return to lab in 2 weeks for follow up cbc with diff- dx is leukocytosis.

## 2012-12-19 NOTE — Telephone Encounter (Signed)
Patient notified of results. Patient advise to submit urine culture and also return in 2 weeks for cbc with diff.

## 2012-12-21 LAB — URINE CULTURE

## 2012-12-25 ENCOUNTER — Ambulatory Visit (INDEPENDENT_AMBULATORY_CARE_PROVIDER_SITE_OTHER): Payer: Medicare Other

## 2012-12-25 DIAGNOSIS — D510 Vitamin B12 deficiency anemia due to intrinsic factor deficiency: Secondary | ICD-10-CM

## 2012-12-25 MED ORDER — CYANOCOBALAMIN 1000 MCG/ML IJ SOLN
1000.0000 ug | Freq: Once | INTRAMUSCULAR | Status: AC
  Administered 2012-12-25: 1000 ug via INTRAMUSCULAR

## 2012-12-25 NOTE — Progress Notes (Signed)
  Subjective:    Patient ID: [name], female    DOB: 11/03/1923, 79 y.o.   MRN: [id number]  HPI    Review of Systems     Objective:   Physical Exam        Assessment & Plan:  Pt came in today for her B12 injection. Pt tolerated injection well 

## 2012-12-28 ENCOUNTER — Telehealth: Payer: Self-pay | Admitting: *Deleted

## 2012-12-28 DIAGNOSIS — N390 Urinary tract infection, site not specified: Secondary | ICD-10-CM

## 2012-12-28 NOTE — Telephone Encounter (Signed)
Message copied by Kathi Simpers on Thu Dec 28, 2012  4:52 PM ------      Message from: O'SULLIVAN, MELISSA      Created: Fri Dec 22, 2012 12:58 PM       Urine culture grew a small amount of bacteria.  If symptomatic we should repeat, otherwise, not likely clinically significant. ------

## 2012-12-28 NOTE — Telephone Encounter (Signed)
Spoke with pt on 12/27/12 and she states she has an appt with [name] on 12/29/12. Advised her I do not see any appts at our facility for 12/29/12 and that if she continues to have symptoms we would like her to repeat the urine culture. She stated she would see [name] Friday to provide specimen.  Spoke to pt's daughter today and advised of need to complete urine culture only (office visit not needed and not currently on our schedule). She voices understanding and will notify pt. Future lab order placed.

## 2012-12-29 ENCOUNTER — Other Ambulatory Visit: Payer: Self-pay | Admitting: Internal Medicine

## 2013-01-01 ENCOUNTER — Ambulatory Visit: Payer: Medicare Other

## 2013-01-01 ENCOUNTER — Other Ambulatory Visit (HOSPITAL_BASED_OUTPATIENT_CLINIC_OR_DEPARTMENT_OTHER): Payer: Medicare Other | Admitting: Lab

## 2013-01-01 DIAGNOSIS — D649 Anemia, unspecified: Secondary | ICD-10-CM

## 2013-01-01 DIAGNOSIS — D469 Myelodysplastic syndrome, unspecified: Secondary | ICD-10-CM

## 2013-01-01 LAB — MORPHOLOGY: PLT EST: ADEQUATE

## 2013-01-01 LAB — CBC WITH DIFFERENTIAL/PLATELET
Basophils Absolute: 0.1 10*3/uL (ref 0.0–0.1)
Eosinophils Absolute: 0.2 10*3/uL (ref 0.0–0.5)
HGB: 11.3 g/dL — ABNORMAL LOW (ref 11.6–15.9)
MCV: 97.6 fL (ref 79.5–101.0)
MONO#: 1.3 10*3/uL — ABNORMAL HIGH (ref 0.1–0.9)
NEUT#: 16.1 10*3/uL — ABNORMAL HIGH (ref 1.5–6.5)
RBC: 3.61 10*6/uL — ABNORMAL LOW (ref 3.70–5.45)
RDW: 16.9 % — ABNORMAL HIGH (ref 11.2–14.5)
WBC: 19 10*3/uL — ABNORMAL HIGH (ref 3.9–10.3)
lymph#: 1.4 10*3/uL (ref 0.9–3.3)

## 2013-01-01 MED ORDER — DARBEPOETIN ALFA-POLYSORBATE 300 MCG/0.6ML IJ SOLN: 300.0000 ug | Freq: Once | INTRAMUSCULAR | Status: DC

## 2013-01-01 NOTE — Telephone Encounter (Signed)
Medication refill

## 2013-01-19 ENCOUNTER — Emergency Department (HOSPITAL_COMMUNITY): Payer: Medicare Other

## 2013-01-19 ENCOUNTER — Encounter (HOSPITAL_COMMUNITY): Payer: Self-pay | Admitting: Emergency Medicine

## 2013-01-19 ENCOUNTER — Observation Stay (HOSPITAL_COMMUNITY)
Admission: EM | Admit: 2013-01-19 | Discharge: 2013-01-23 | Disposition: A | Discharge status: Skilled Nursing Facility | Payer: Medicare Other | Attending: Internal Medicine | Admitting: Internal Medicine

## 2013-01-19 DIAGNOSIS — W1809XA Striking against other object with subsequent fall, initial encounter: Secondary | ICD-10-CM | POA: Insufficient documentation

## 2013-01-19 DIAGNOSIS — M1710 Unilateral primary osteoarthritis, unspecified knee: Secondary | ICD-10-CM | POA: Insufficient documentation

## 2013-01-19 DIAGNOSIS — Z950 Presence of cardiac pacemaker: Secondary | ICD-10-CM

## 2013-01-19 DIAGNOSIS — D649 Anemia, unspecified: Secondary | ICD-10-CM | POA: Insufficient documentation

## 2013-01-19 DIAGNOSIS — N189 Chronic kidney disease, unspecified: Secondary | ICD-10-CM

## 2013-01-19 DIAGNOSIS — IMO0002 Reserved for concepts with insufficient information to code with codable children: Secondary | ICD-10-CM | POA: Diagnosis present

## 2013-01-19 DIAGNOSIS — M549 Dorsalgia, unspecified: Secondary | ICD-10-CM | POA: Insufficient documentation

## 2013-01-19 DIAGNOSIS — R262 Difficulty in walking, not elsewhere classified: Principal | ICD-10-CM | POA: Insufficient documentation

## 2013-01-19 DIAGNOSIS — R627 Adult failure to thrive: Secondary | ICD-10-CM | POA: Insufficient documentation

## 2013-01-19 DIAGNOSIS — E43 Unspecified severe protein-calorie malnutrition: Secondary | ICD-10-CM

## 2013-01-19 DIAGNOSIS — E039 Hypothyroidism, unspecified: Secondary | ICD-10-CM | POA: Insufficient documentation

## 2013-01-19 DIAGNOSIS — I472 Ventricular tachycardia: Secondary | ICD-10-CM

## 2013-01-19 DIAGNOSIS — J329 Chronic sinusitis, unspecified: Secondary | ICD-10-CM

## 2013-01-19 DIAGNOSIS — D469 Myelodysplastic syndrome, unspecified: Secondary | ICD-10-CM | POA: Insufficient documentation

## 2013-01-19 DIAGNOSIS — R42 Dizziness and giddiness: Secondary | ICD-10-CM

## 2013-01-19 DIAGNOSIS — D539 Nutritional anemia, unspecified: Secondary | ICD-10-CM

## 2013-01-19 DIAGNOSIS — R634 Abnormal weight loss: Secondary | ICD-10-CM

## 2013-01-19 DIAGNOSIS — G4700 Insomnia, unspecified: Secondary | ICD-10-CM

## 2013-01-19 DIAGNOSIS — I951 Orthostatic hypotension: Secondary | ICD-10-CM | POA: Insufficient documentation

## 2013-01-19 DIAGNOSIS — H109 Unspecified conjunctivitis: Secondary | ICD-10-CM

## 2013-01-19 DIAGNOSIS — S32009A Unspecified fracture of unspecified lumbar vertebra, initial encounter for closed fracture: Secondary | ICD-10-CM | POA: Insufficient documentation

## 2013-01-19 DIAGNOSIS — E440 Moderate protein-calorie malnutrition: Secondary | ICD-10-CM | POA: Insufficient documentation

## 2013-01-19 DIAGNOSIS — D696 Thrombocytopenia, unspecified: Secondary | ICD-10-CM | POA: Insufficient documentation

## 2013-01-19 DIAGNOSIS — Y92009 Unspecified place in unspecified non-institutional (private) residence as the place of occurrence of the external cause: Secondary | ICD-10-CM | POA: Insufficient documentation

## 2013-01-19 DIAGNOSIS — R51 Headache: Secondary | ICD-10-CM

## 2013-01-19 DIAGNOSIS — I658 Occlusion and stenosis of other precerebral arteries: Secondary | ICD-10-CM | POA: Insufficient documentation

## 2013-01-19 DIAGNOSIS — Z9181 History of falling: Secondary | ICD-10-CM | POA: Insufficient documentation

## 2013-01-19 DIAGNOSIS — I739 Peripheral vascular disease, unspecified: Secondary | ICD-10-CM

## 2013-01-19 DIAGNOSIS — D72829 Elevated white blood cell count, unspecified: Secondary | ICD-10-CM | POA: Insufficient documentation

## 2013-01-19 DIAGNOSIS — I2510 Atherosclerotic heart disease of native coronary artery without angina pectoris: Secondary | ICD-10-CM | POA: Insufficient documentation

## 2013-01-19 DIAGNOSIS — Y93E2 Activity, laundry: Secondary | ICD-10-CM | POA: Insufficient documentation

## 2013-01-19 DIAGNOSIS — I129 Hypertensive chronic kidney disease with stage 1 through stage 4 chronic kidney disease, or unspecified chronic kidney disease: Secondary | ICD-10-CM | POA: Insufficient documentation

## 2013-01-19 DIAGNOSIS — F329 Major depressive disorder, single episode, unspecified: Secondary | ICD-10-CM

## 2013-01-19 DIAGNOSIS — T148XXA Other injury of unspecified body region, initial encounter: Secondary | ICD-10-CM

## 2013-01-19 DIAGNOSIS — E785 Hyperlipidemia, unspecified: Secondary | ICD-10-CM | POA: Insufficient documentation

## 2013-01-19 DIAGNOSIS — I442 Atrioventricular block, complete: Secondary | ICD-10-CM | POA: Insufficient documentation

## 2013-01-19 DIAGNOSIS — E119 Type 2 diabetes mellitus without complications: Secondary | ICD-10-CM | POA: Insufficient documentation

## 2013-01-19 DIAGNOSIS — N390 Urinary tract infection, site not specified: Secondary | ICD-10-CM | POA: Diagnosis present

## 2013-01-19 DIAGNOSIS — R0789 Other chest pain: Secondary | ICD-10-CM

## 2013-01-19 DIAGNOSIS — M25552 Pain in left hip: Secondary | ICD-10-CM | POA: Diagnosis present

## 2013-01-19 DIAGNOSIS — M1990 Unspecified osteoarthritis, unspecified site: Secondary | ICD-10-CM

## 2013-01-19 DIAGNOSIS — M25559 Pain in unspecified hip: Secondary | ICD-10-CM

## 2013-01-19 DIAGNOSIS — W19XXXD Unspecified fall, subsequent encounter: Secondary | ICD-10-CM

## 2013-01-19 DIAGNOSIS — D510 Vitamin B12 deficiency anemia due to intrinsic factor deficiency: Secondary | ICD-10-CM

## 2013-01-19 DIAGNOSIS — L989 Disorder of the skin and subcutaneous tissue, unspecified: Secondary | ICD-10-CM

## 2013-01-19 DIAGNOSIS — N183 Chronic kidney disease, stage 3 unspecified: Secondary | ICD-10-CM | POA: Insufficient documentation

## 2013-01-19 DIAGNOSIS — I10 Essential (primary) hypertension: Secondary | ICD-10-CM

## 2013-01-19 DIAGNOSIS — C679 Malignant neoplasm of bladder, unspecified: Secondary | ICD-10-CM

## 2013-01-19 DIAGNOSIS — I6529 Occlusion and stenosis of unspecified carotid artery: Secondary | ICD-10-CM | POA: Insufficient documentation

## 2013-01-19 LAB — CBC WITH DIFFERENTIAL/PLATELET
Basophils Absolute: 0 10*3/uL (ref 0.0–0.1)
Eosinophils Relative: 0 % (ref 0–5)
Lymphocytes Relative: 5 % — ABNORMAL LOW (ref 12–46)
Lymphs Abs: 1 10*3/uL (ref 0.7–4.0)
Neutrophils Relative %: 89 % — ABNORMAL HIGH (ref 43–77)
Platelets: 173 10*3/uL (ref 150–400)
RBC: 3.12 MIL/uL — ABNORMAL LOW (ref 3.87–5.11)
RDW: 16.2 % — ABNORMAL HIGH (ref 11.5–15.5)
WBC: 20 10*3/uL — ABNORMAL HIGH (ref 4.0–10.5)

## 2013-01-19 LAB — BASIC METABOLIC PANEL
CO2: 30 mEq/L (ref 19–32)
Calcium: 9.4 mg/dL (ref 8.4–10.5)
GFR calc non Af Amer: 30 mL/min — ABNORMAL LOW (ref >90)
Glucose, Bld: 162 mg/dL — ABNORMAL HIGH (ref 70–99)
Potassium: 4.1 mEq/L (ref 3.5–5.1)
Sodium: 135 mEq/L (ref 135–145)

## 2013-01-19 LAB — POCT I-STAT TROPONIN I: Troponin i, poc: 0.02 ng/mL (ref 0.00–0.08)

## 2013-01-19 LAB — CK TOTAL AND CKMB (NOT AT ARMC)
CK, MB: 2.7 ng/mL (ref 0.3–4.0)
Total CK: 97 U/L (ref 7–177)

## 2013-01-19 MED ORDER — ONDANSETRON HCL 4 MG/2ML IJ SOLN: 4.0000 mg | Freq: Four times a day (QID) | INTRAMUSCULAR | Status: DC | PRN

## 2013-01-19 MED ORDER — HEPARIN SODIUM (PORCINE) 5000 UNIT/ML IJ SOLN
5000.0000 [IU] | Freq: Three times a day (TID) | INTRAMUSCULAR | Status: DC
  Administered 2013-01-20 – 2013-01-23 (×11): 5000 [IU] via SUBCUTANEOUS
  Filled 2013-01-19 (×14): qty 1

## 2013-01-19 MED ORDER — HYDROMORPHONE HCL PF 1 MG/ML IJ SOLN
0.5000 mg | INTRAMUSCULAR | Status: DC | PRN
  Administered 2013-01-20 – 2013-01-23 (×7): 0.5 mg via INTRAVENOUS
  Filled 2013-01-19 (×7): qty 1

## 2013-01-19 MED ORDER — DARIFENACIN HYDROBROMIDE ER 7.5 MG PO TB24
7.5000 mg | ORAL_TABLET | Freq: Every day | ORAL | Status: DC
  Administered 2013-01-20 – 2013-01-23 (×4): 7.5 mg via ORAL
  Filled 2013-01-19 (×4): qty 1

## 2013-01-19 MED ORDER — DOCUSATE SODIUM 100 MG PO CAPS
100.0000 mg | ORAL_CAPSULE | Freq: Two times a day (BID) | ORAL | Status: DC
  Administered 2013-01-20 – 2013-01-23 (×7): 100 mg via ORAL
  Filled 2013-01-19 (×8): qty 1

## 2013-01-19 MED ORDER — ONDANSETRON HCL 4 MG PO TABS: 4.0000 mg | ORAL_TABLET | Freq: Four times a day (QID) | ORAL | Status: DC | PRN

## 2013-01-19 MED ORDER — ATORVASTATIN CALCIUM 40 MG PO TABS
40.0000 mg | ORAL_TABLET | Freq: Every day | ORAL | Status: DC
  Administered 2013-01-20 – 2013-01-22 (×3): 40 mg via ORAL
  Filled 2013-01-19 (×4): qty 1

## 2013-01-19 MED ORDER — FENTANYL CITRATE 0.05 MG/ML IJ SOLN
50.0000 ug | Freq: Once | INTRAMUSCULAR | Status: AC
  Administered 2013-01-19: 50 ug via INTRAVENOUS
  Filled 2013-01-19: qty 2

## 2013-01-19 MED ORDER — CYANOCOBALAMIN 500 MCG PO TABS
500.0000 ug | ORAL_TABLET | Freq: Every day | ORAL | Status: DC
  Administered 2013-01-20 – 2013-01-23 (×4): 500 ug via ORAL
  Filled 2013-01-19 (×4): qty 1

## 2013-01-19 MED ORDER — ASPIRIN EC 81 MG PO TBEC
81.0000 mg | DELAYED_RELEASE_TABLET | Freq: Every day | ORAL | Status: DC
  Administered 2013-01-20 – 2013-01-23 (×4): 81 mg via ORAL
  Filled 2013-01-19 (×4): qty 1

## 2013-01-19 MED ORDER — PANTOPRAZOLE SODIUM 40 MG PO TBEC
40.0000 mg | DELAYED_RELEASE_TABLET | Freq: Every day | ORAL | Status: DC
  Administered 2013-01-20 – 2013-01-23 (×4): 40 mg via ORAL
  Filled 2013-01-19 (×4): qty 1

## 2013-01-19 MED ORDER — PAROXETINE HCL 20 MG PO TABS
20.0000 mg | ORAL_TABLET | ORAL | Status: DC
  Administered 2013-01-20 – 2013-01-23 (×4): 20 mg via ORAL
  Filled 2013-01-19 (×5): qty 1

## 2013-01-19 MED ORDER — MORPHINE SULFATE 4 MG/ML IJ SOLN
4.0000 mg | Freq: Once | INTRAMUSCULAR | Status: AC
  Administered 2013-01-19: 4 mg via INTRAVENOUS
  Filled 2013-01-19: qty 1

## 2013-01-19 MED ORDER — BACITRACIN-POLYMYXIN B 500-10000 UNIT/GM OP OINT
TOPICAL_OINTMENT | Freq: Two times a day (BID) | OPHTHALMIC | Status: DC
  Administered 2013-01-20 – 2013-01-23 (×7): via OPHTHALMIC
  Filled 2013-01-19: qty 3.5

## 2013-01-19 MED ORDER — LEVOTHYROXINE SODIUM 112 MCG PO TABS
112.0000 ug | ORAL_TABLET | Freq: Every day | ORAL | Status: DC
  Administered 2013-01-20 – 2013-01-23 (×4): 112 ug via ORAL
  Filled 2013-01-19 (×5): qty 1

## 2013-01-19 MED ORDER — ACETAMINOPHEN 500 MG PO TABS: 500.0000 mg | ORAL_TABLET | Freq: Every day | ORAL | Status: DC | PRN

## 2013-01-19 NOTE — ED Notes (Signed)
Pt states she was doing laundry around 4pm and she remembers losing her balance and falling,  States no LOC  But did hit back of head pain 7/10 left hip pain 10/10,  Left leg slightly shorter than right.

## 2013-01-19 NOTE — ED Notes (Signed)
Per Rhunette Croft, wait for another IV until further results of xray and ambulation of pt

## 2013-01-19 NOTE — ED Notes (Signed)
Pt in in xray that is why pain medications haven't been given yet

## 2013-01-19 NOTE — H&P (Signed)
[facility] History and Physical  [name] JXB:[id number] DOB: [birth year]    PCP:   Danise Edge, MD   Chief Complaint: fell and hurt left hip.  HPI: [name] is an 79 y.o. female with hx of MDS, PVD, DM, CAD, hypothyroidism, HTN, hyperlipidemia, AVB s/p PPM (no MRI), lives alone with home health aids, fell today doing laudry, hit her head, presents to the ER with back pain, and bilateral hip pain left greater than right.  She has had left hip fracture in 2/14 s/p ORIF.  Work up in the ER showed WBC of 20K, CT head, cervical spine was negative.  X ray of lumbar, hips, femurs, right forearm, showed no acute Fx, except compression Fx of 20 percent L2 new since 2009.  Her Cr is 1.5.  Attempting to ambulate her, she has severe pain in both hips, with left greater than right.  Hospitalist was asked to admit her for further work up and disposition, since she lives alone and cannot ambulate due to pain.  Rewiew of Systems:  Constitutional: Negative for malaise, fever and chills. No significant weight loss or weight gain Eyes: Negative for eye pain, redness and discharge, diplopia, visual changes, or flashes of light. ENMT: Negative for ear pain, hoarseness, nasal congestion, sinus pressure and sore throat. No headaches; tinnitus, drooling, or problem swallowing. Cardiovascular: Negative for chest pain, palpitations, diaphoresis, dyspnea and peripheral edema. ; No orthopnea, PND Respiratory: Negative for cough, hemoptysis, wheezing and stridor. No pleuritic chestpain. Gastrointestinal: Negative for nausea, vomiting, diarrhea, constipation, abdominal pain, melena, blood in stool, hematemesis, jaundice and rectal bleeding.    Genitourinary: Negative for frequency, dysuria, incontinence,flank pain and hematuria; Musculoskeletal: left hip pain, lower back pain. Skin: . Negative for pruritus, rash, abrasions, bruising and skin lesion.; ulcerations Neuro: Negative for headache,  lightheadedness and neck stiffness. Negative for weakness, altered level of consciousness , altered mental status, extremity weakness, burning feet, involuntary movement, seizure and syncope.  Psych: negative for anxiety, depression, insomnia, tearfulness, panic attacks, hallucinations, paranoia, suicidal or homicidal ideation   Past Medical History  Diagnosis Date  . CAD (coronary artery disease)     a. 10/2007 PCI Diag w/ 2.25 x 8mm Taxus Atom DES.;  b. 09/2011 Lexi MV: EF 69%, no ischemia/infarct.  Marland Kitchen PVD (peripheral vascular disease)   . Carotid artery stenosis     Dr. Graciela Husbands  . HTN (hypertension)   . Hyperlipidemia   . Hypothyroidism   . CKD (chronic kidney disease), stage III   . Diabetes mellitus 07/16/2011  . Allergic state 07/16/2011  . Insomnia 07/16/2011  . Recurrent UTI 07/16/2011  . Asthma     childhood, recurrent at 64  . Myelodysplastic syndrome 04/02/2011  . Sinusitis 07/18/2011  . Anemia 07/18/2011  . Arthritis 07/18/2011  . Cataract extraction status of right eye 08/18/2011  . Anxiety and depression 08/18/2011  . Bronchitis, acute 12/20/2011  . Headache(784.0) 03/02/2012  . AV heart block     a. 10/2007 MDT Versa dual chamber PPM.  . Bladder cancer 12/2002  . Closed intertrochanteric fracture of left hip 05/27/2012  . Loss of weight 09/04/2012  . Renal failure, chronic 09/05/2012  . Conjunctivitis unspecified 11/21/2012  . Frequent falls     Past Surgical History  Procedure Laterality Date  . Cholecystectomy    . Pcm - medtronic    . Abdominal hysterectomy    . Shoulder open rotator cuff repair    . Foot surgery      bunionectomy  .  Intramedullary (im) nail intertrochanteric  05/27/2012    Procedure: INTRAMEDULLARY (IM) NAIL INTERTROCHANTRIC;  Surgeon: Eulas Post, MD;  Location: [facility] OR;  Service: Orthopedics;  Laterality: Left;  . Pacemaker insertion      Medications:  HOME MEDS: Prior to Admission medications   Medication Sig Start Date End Date Taking? Authorizing  Provider  acetaminophen (TYLENOL) 500 MG tablet Take 500 mg by mouth daily as needed. For pain   Yes Historical Provider, MD  aspirin EC 81 MG tablet Take 81 mg by mouth daily.   Yes Historical Provider, MD  bacitracin-polymyxin b (POLYSPORIN) ophthalmic ointment Place into the left eye every 12 (twelve) hours. apply to eye every 12 hours while awake 11/21/12  Yes Bradd Canary, MD  darifenacin (ENABLEX) 7.5 MG 24 hr tablet Take 7.5 mg by mouth daily.   Yes Historical Provider, MD  lactose free nutrition (BOOST) LIQD Take 237 mLs by mouth 3 (three) times daily between meals.   Yes Historical Provider, MD  levothyroxine (SYNTHROID, LEVOTHROID) 112 MCG tablet Take 1 tablet (112 mcg total) by mouth daily. 06/09/12  Yes Daniel J Angiulli, PA-C  pantoprazole (PROTONIX) 40 MG tablet TAKE 1 TABLET BY MOUTH DAILY 12/29/12  Yes Jeanann Lewandowsky, MD  PARoxetine (PAXIL) 20 MG tablet Take 1 tablet (20 mg total) by mouth every morning. 11/07/12  Yes Bradd Canary, MD  rosuvastatin (CRESTOR) 20 MG tablet Take 1 tablet (20 mg total) by mouth at bedtime. 06/09/12  Yes Daniel J Angiulli, PA-C  vitamin B-12 (CYANOCOBALAMIN) 1000 MCG tablet Take 1 tablet (1,000 mcg total) by mouth daily. Prefer a sublingual preparation 11/07/12  Yes Bradd Canary, MD  vitamin B-12 (CYANOCOBALAMIN) 500 MCG tablet Take 500 mcg by mouth daily.   Yes Historical Provider, MD     Allergies:  Allergies  Allergen Reactions  . Codeine Anaphylaxis and Swelling  . Penicillins Anaphylaxis and Swelling  . Sulfonamide Derivatives Anaphylaxis and Swelling    Social History:   reports that she has never smoked. She has never used smokeless tobacco. She reports that she does not drink alcohol or use illicit drugs.  Family History: Family History  Problem Relation Age of Onset  . Heart disease Mother     chf  . Arthritis Mother   . Arthritis Father   . Kidney disease Father   . Hypertension Father   . Cancer Brother     lung  .  Arthritis Daughter     MVA with very serious injuries     Physical Exam: Filed Vitals:   01/19/13 1918 01/19/13 1922  BP: 138/89 138/89  Pulse: 81 86  Temp:  98.3 F (36.8 C)  TempSrc: Oral Oral  Resp: 16 18  Height: 5' (1.524 m)   Weight: 43.092 kg (95 lb)   SpO2: 100% 97%   Blood pressure 138/89, pulse 86, temperature 98.3 F (36.8 C), temperature source Oral, resp. rate 18, height 5' (1.524 m), weight 43.092 kg (95 lb), SpO2 97.00%.  GEN:  Pleasant patient lying in the stretcher in no acute distress; cooperative with exam. PSYCH:  alert and oriented x4; does not appear anxious or depressed; affect is appropriate. HEENT: Mucous membranes pink and anicteric; PERRLA; EOM intact; no cervical lymphadenopathy nor thyromegaly or carotid bruit; no JVD; There were no stridor. Neck is very supple. Breasts:: Not examined CHEST WALL: No tenderness CHEST: Normal respiration, clear to auscultation bilaterally.  HEART: Regular rate and rhythm.  There are no murmur, rub, or gallops.  BACK: No kyphosis or scoliosis; no CVA tenderness ABDOMEN: soft and non-tender; no masses, no organomegaly, normal abdominal bowel sounds; no pannus; no intertriginous candida. There is no rebound and no distention. Rectal Exam: Not done EXTREMITIES: No bone or joint deformity; age-appropriate arthropathy of the hands and knees; no edema; no ulcerations.  There is no calf tenderness. She has left hip tenderness.  With tenderness when slightly internally rotated. Genitalia: not examined PULSES: 2+ and symmetric SKIN: Normal hydration no rash or ulceration CNS: Cranial nerves 2-12 grossly intact no focal lateralizing neurologic deficit.  Speech is fluent; uvula elevated with phonation, facial symmetry and tongue midline. DTR are normal bilaterally, cerebella exam is intact, barbinski is negative and strengths are equaled bilaterally.  No sensory loss.   Labs on Admission:  Basic Metabolic Panel:  Recent  Labs Lab 01/19/13 1949  NA 135  K 4.1  CL 96  CO2 30  GLUCOSE 162*  BUN 25*  CREATININE 1.52*  CALCIUM 9.4   Liver Function Tests: No results found for this basename: AST, ALT, ALKPHOS, BILITOT, PROT, ALBUMIN,  in the last 168 hours No results found for this basename: LIPASE, AMYLASE,  in the last 168 hours No results found for this basename: AMMONIA,  in the last 168 hours CBC:  Recent Labs Lab 01/19/13 1949  WBC 20.0*  NEUTROABS 17.7*  HGB 9.7*  HCT 30.3*  MCV 97.1  PLT 173   Cardiac Enzymes:  Recent Labs Lab 01/19/13 1919  CKTOTAL 97  CKMB 2.7    CBG: No results found for this basename: GLUCAP,  in the last 168 hours   Radiological Exams on Admission: Dg Chest 1 View  01/19/2013   *RADIOLOGY REPORT*  Clinical Data: Low back pain post fall  CHEST - 1 VIEW  Comparison: 12/03/2012  Findings: Left subclavian transvenous pacemaker leads project over right atrium and right ventricle unchanged. Minimal enlargement of cardiac silhouette. Calcified tortuous aorta. Pulmonary vascularity normal. Bronchitic changes with interval decrease in interstitial prominence since the previous exam. No segmental infiltrate, pleural effusion or pneumothorax. Minimal bibasilar atelectasis. Healing fractures of the posterior right fourth fifth and sixth ribs.  IMPRESSION: Minimal enlargement of cardiac silhouette post pacemaker. Bronchitic changes with bibasilar atelectasis versus scarring. Decreased interstitial prominence since previous exam without definite acute infiltrate.   Original Report Authenticated By: Ulyses Southward, M.D.   Dg Lumbar Spine Complete  01/19/2013   *RADIOLOGY REPORT*  Clinical Data: Low back pain, pain down left side of buttocks, fell today  LUMBAR SPINE - COMPLETE 4+ VIEW  Comparison: None Correlation:  CT abdomen pelvis 12/10/2007  Findings: Osseous demineralization. Five non-rib bearing lumbar vertebrae. Dextroconvex thoracolumbar scoliosis apex L2. Superior endplate  compression deformity of L2 with 20% height loss anteriorly. Finding is new since prior abdominal CT. Remaining vertebral body heights maintained. Disc space narrowing greatest at L4-L5 with small endplate spurs. No additional fracture, subluxation or bone destruction. Facet degenerative changes lower lumbar spine. No spondylolysis. Scattered atherosclerotic calcifications.  IMPRESSION: Superior endplate compression fracture of L2 with 20% anterior height loss, new since 2009.   Original Report Authenticated By: Ulyses Southward, M.D.   Dg Forearm Right  01/19/2013   *RADIOLOGY REPORT*  Clinical Data: History of fall complaining of right forearm pain.  RIGHT FOREARM - 2 VIEW  Comparison: No priors.  Findings: AP and lateral views of the right forearm demonstrate no definite acute displaced fractures of the radius or ulna.  Bones appear mildly osteopenic.  IMPRESSION: 1.  No acute  displaced fractures of the radius or ulna.   Original Report Authenticated By: Trudie Reed, M.D.   Dg Hip Complete Left  01/19/2013   *RADIOLOGY REPORT*  Clinical Data:  Larey Seat today, low back pain, pain down left side of buttocks  LEFT HIP - COMPLETE 2+ VIEW  Comparison: Intraoperative images left hip 05/27/2012  Findings: Diffuse osseous demineralization. Orthopedic hardware proximal left femur post ORIF of an intertrochanteric fracture. Mild bilateral hip joint space narrowing. Asymmetric SI joints, likely due to rotation. No acute fracture, dislocation or bone destruction. Scattered atherosclerotic calcifications.  IMPRESSION: Osseous demineralization. Post ORIF proximal left femur. No definite acute bony abnormalities identified though the distal extent of the IM nail within the left femur is not imaged; if the patient has symptoms referable to the distal left thigh, recommend dedicated radiographs of the left femur.   Original Report Authenticated By: Ulyses Southward, M.D.   Dg Femur Left  01/19/2013   *RADIOLOGY REPORT*  Clinical Data:  Left hip pain post fall  LEFT FEMUR - 2 VIEW  Comparison: Left hip radiographs 01/19/2013  Findings: Remainder of left femur is imaged. IM nail extends from the greater trochanter to the distal left femoral metaphysis. No acute fracture or dislocation. Diffuse osseous demineralization. Tricompartmental osteoarthritic changes of the left knee. Scattered atherosclerotic calcifications.  IMPRESSION: IM nail left femur. Osseous demineralization with osteoarthritic changes left knee. No acute bony abnormalities.   Original Report Authenticated By: Ulyses Southward, M.D.   Ct Head Wo Contrast  01/19/2013   *RADIOLOGY REPORT*  Clinical Data:  History of trauma from a fall.  Headache.  CT HEAD WITHOUT CONTRAST CT CERVICAL SPINE WITHOUT CONTRAST  Technique:  Multidetector CT imaging of the head and cervical spine was performed following the standard protocol without intravenous contrast.  Multiplanar CT image reconstructions of the cervical spine were also generated.  Comparison:  Head CT and cervical spine CT 02/23/2011.  CT HEAD  Findings: Mild cerebral and cerebellar atrophy.  Extensive patchy extensive patchy and confluent areas of decreased attenuation throughout the deep and periventricular white matter of the cerebral hemispheres bilaterally, compatible with chronic microvascular ischemic disease.  Physiologic calcifications of the basal ganglia bilaterally (right greater than left). No acute displaced skull fractures are identified.  No acute intracranial abnormality.  Specifically, no evidence of acute post-traumatic intracranial hemorrhage, no definite regions of acute/subacute cerebral ischemia, no focal mass, mass effect, hydrocephalus or abnormal intra or extra-axial fluid collections.  The visualized paranasal sinuses and mastoids are well pneumatized.  IMPRESSION: 1.  No acute displaced skull fractures or findings to suggest significant acute traumatic injury to the brain. 2.  Mild cerebral and cerebellar atrophy  with extensive chronic microvascular ischemic changes in the cerebral white matter redemonstrated, as above.  CT CERVICAL SPINE  Findings: No acute displaced fractures of the cervical spine. Alignment is anatomic.  Prevertebral soft tissues are normal. Severe multilevel degenerative disc disease, most pronounced at C4- C5, C5-C6 and C6-C7.  Mild multilevel facet arthropathy is also noted.  Visualized portions of the upper thorax are unremarkable.  IMPRESSION: 1.  No evidence to suggest significant acute traumatic injury to the cervical spine. 2.  Multilevel degenerative disc disease and cervical spondylosis, as above.   Original Report Authenticated By: Trudie Reed, M.D.   Ct Cervical Spine Wo Contrast  01/19/2013   *RADIOLOGY REPORT*  Clinical Data:  History of trauma from a fall.  Headache.  CT HEAD WITHOUT CONTRAST CT CERVICAL SPINE WITHOUT CONTRAST  Technique:  Multidetector CT  imaging of the head and cervical spine was performed following the standard protocol without intravenous contrast.  Multiplanar CT image reconstructions of the cervical spine were also generated.  Comparison:  Head CT and cervical spine CT 02/23/2011.  CT HEAD  Findings: Mild cerebral and cerebellar atrophy.  Extensive patchy extensive patchy and confluent areas of decreased attenuation throughout the deep and periventricular white matter of the cerebral hemispheres bilaterally, compatible with chronic microvascular ischemic disease.  Physiologic calcifications of the basal ganglia bilaterally (right greater than left). No acute displaced skull fractures are identified.  No acute intracranial abnormality.  Specifically, no evidence of acute post-traumatic intracranial hemorrhage, no definite regions of acute/subacute cerebral ischemia, no focal mass, mass effect, hydrocephalus or abnormal intra or extra-axial fluid collections.  The visualized paranasal sinuses and mastoids are well pneumatized.  IMPRESSION: 1.  No acute displaced  skull fractures or findings to suggest significant acute traumatic injury to the brain. 2.  Mild cerebral and cerebellar atrophy with extensive chronic microvascular ischemic changes in the cerebral white matter redemonstrated, as above.  CT CERVICAL SPINE  Findings: No acute displaced fractures of the cervical spine. Alignment is anatomic.  Prevertebral soft tissues are normal. Severe multilevel degenerative disc disease, most pronounced at C4- C5, C5-C6 and C6-C7.  Mild multilevel facet arthropathy is also noted.  Visualized portions of the upper thorax are unremarkable.  IMPRESSION: 1.  No evidence to suggest significant acute traumatic injury to the cervical spine. 2.  Multilevel degenerative disc disease and cervical spondylosis, as above.   Original Report Authenticated By: Trudie Reed, M.D.    Assessment/Plan Present on Admission:  . Left hip pain . Atrioventricular block, complete . Diabetes mellitus . HYPOTENSION, ORTHOSTATIC . HYPOTHYROIDISM . Pacemaker-Medtronic . RENAL FAILURE, CHRONIC . Compression fracture . Myelodysplastic syndrome  PLAN:  Will admit her for further work up of her inability to ambulate.  Since she had ORIF of the left hip, and Xray was negative, we'll proceed with CT scan of the hips, though with osteoporosis, CT may not pick up occult Fx either.  She has an PPM, so will not get MRI.  If CT is negative, and she continue to have significant pain, please consult ortho.  For her DM, will follow her BS and use sensitive SSI.  For her hypothyroidism, I will check TSH and continue supplements.  Her CKD with previous Cr 1.3, now is 1.5.  Will continue to follow. Will give a little IVF.  For her MDS, her previous WBC was 20K, and it is the same, follow up with Dr Marlena Clipper.  She is stable, full code, and will be admitted to [facility] service.  Off note, Lauren her grand-daughter inform me that her POA/HCP is her son, but her daughter (the one here tonight) and her son have had  family discord, so they don't hardly speak to each other.   Thank you for allowing me to participate in her care.  Other plans as per orders.  Code Status: FULL Unk Lightning, MD. [facility] Pager [phone] 7pm to 7am.  01/19/2013, 11:07 PM

## 2013-01-19 NOTE — ED Notes (Signed)
Right arm hematoma entailing entire right lower elbow to hand

## 2013-01-19 NOTE — ED Notes (Signed)
Per EMS report, pt "fell backwards while hang clothes" Pt denies LOC, complains of back pain and bilateral hip pain at this time. Pt reports to falling around 1600, but was unable to get help until 1830.

## 2013-01-19 NOTE — ED Notes (Signed)
Upon attempting to sit patient up for ambulation, pt began complaining of pain. Unable to ambulate. PA aware

## 2013-01-19 NOTE — ED Notes (Signed)
Bed: RU04 Expected date:  Expected time:  Means of arrival:  Comments: EMS-fall-hip deformity

## 2013-01-19 NOTE — ED Provider Notes (Signed)
CSN: [id number]     Arrival date & time 01/19/13  1914 History   First MD Initiated Contact with Patient 01/19/13 1922     Chief Complaint  Patient presents with  . Fall   (Consider location/radiation/quality/duration/timing/severity/associated sxs/prior Treatment) Patient is a 79 y.o. female presenting with fall. The history is provided by the patient and medical records.  Fall Associated symptoms include arthralgias.   Patient with extensive PMH presents to the ED ANS for all prior to arrival. Patient states she was trying to hang up her laundry when she fell backwards. Patient states she did hit her head but denies LOC.  Pt states she was unable to get up off the floor by herself.  Daughter called repeatedly and could not get an answer so she drove by and found pt lying in the floor-- estimated she was lying in the floor approx 3 hours prior. Pt has a life-alert but was not wearing it at the time of injury.  Patient now complains of low back pain, bilateral pain, and headache. Denies any visual disturbance, tinnitus, confusion, changes in speech, numbness or paresthesias. Patient has a history of a prior left hip fracture repair by Dr. Dion Saucier 05/27/12.  Patient on daily aspirin, no other anticoagulants.  Patient alert and oriented x3 on arrival, VS stable.  Pt has been unable to ambulate since fall.  Family present at bedside states pt is at baseline.  Past Medical History  Diagnosis Date  . CAD (coronary artery disease)     a. 10/2007 PCI Diag w/ 2.25 x 8mm Taxus Atom DES.;  b. 09/2011 Lexi MV: EF 69%, no ischemia/infarct.  Marland Kitchen PVD (peripheral vascular disease)   . Carotid artery stenosis     Dr. Graciela Husbands  . HTN (hypertension)   . Hyperlipidemia   . Hypothyroidism   . CKD (chronic kidney disease), stage III   . Diabetes mellitus 07/16/2011  . Allergic state 07/16/2011  . Insomnia 07/16/2011  . Recurrent UTI 07/16/2011  . Asthma     childhood, recurrent at 30  . Myelodysplastic syndrome 04/02/2011    . Sinusitis 07/18/2011  . Anemia 07/18/2011  . Arthritis 07/18/2011  . Cataract extraction status of right eye 08/18/2011  . Anxiety and depression 08/18/2011  . Bronchitis, acute 12/20/2011  . Headache(784.0) 03/02/2012  . AV heart block     a. 10/2007 MDT Versa dual chamber PPM.  . Bladder cancer 12/2002  . Closed intertrochanteric fracture of left hip 05/27/2012  . Loss of weight 09/04/2012  . Renal failure, chronic 09/05/2012  . Conjunctivitis unspecified 11/21/2012  . Frequent falls    Past Surgical History  Procedure Laterality Date  . Cholecystectomy    . Pcm - medtronic    . Abdominal hysterectomy    . Shoulder open rotator cuff repair    . Foot surgery      bunionectomy  . Intramedullary (im) nail intertrochanteric  05/27/2012    Procedure: INTRAMEDULLARY (IM) NAIL INTERTROCHANTRIC;  Surgeon: Eulas Post, MD;  Location: [facility] OR;  Service: Orthopedics;  Laterality: Left;  . Pacemaker insertion     Family History  Problem Relation Age of Onset  . Heart disease Mother     chf  . Arthritis Mother   . Arthritis Father   . Kidney disease Father   . Hypertension Father   . Cancer Brother     lung  . Arthritis Daughter     MVA with very serious injuries   History  Substance Use Topics  .  Smoking status: Never Smoker   . Smokeless tobacco: Never Used  . Alcohol Use: No   OB History   Grav Para Term Preterm Abortions TAB SAB Ect Mult Living                 Review of Systems  Musculoskeletal: Positive for back pain and arthralgias.  All other systems reviewed and are negative.    Allergies  Codeine; Penicillins; and Sulfonamide derivatives  Home Medications   Current Outpatient Rx  Name  Route  Sig  Dispense  Refill  . acetaminophen (TYLENOL) 500 MG tablet   Oral   Take 500 mg by mouth daily as needed. For pain         . aspirin EC 81 MG tablet   Oral   Take 81 mg by mouth daily.         . bacitracin-polymyxin b (POLYSPORIN) ophthalmic ointment   Left  Eye   Place into the left eye every 12 (twelve) hours. apply to eye every 12 hours while awake   3.5 g   1   . darifenacin (ENABLEX) 7.5 MG 24 hr tablet   Oral   Take 7.5 mg by mouth daily.         Marland Kitchen lactose free nutrition (BOOST) LIQD   Oral   Take 237 mLs by mouth 3 (three) times daily between meals.         Marland Kitchen levothyroxine (SYNTHROID, LEVOTHROID) 112 MCG tablet   Oral   Take 1 tablet (112 mcg total) by mouth daily.   30 tablet   1   . pantoprazole (PROTONIX) 40 MG tablet      TAKE 1 TABLET BY MOUTH DAILY   30 tablet   0   . PARoxetine (PAXIL) 20 MG tablet   Oral   Take 1 tablet (20 mg total) by mouth every morning.   30 tablet   2   . rosuvastatin (CRESTOR) 20 MG tablet   Oral   Take 1 tablet (20 mg total) by mouth at bedtime.   30 tablet   1   . vitamin B-12 (CYANOCOBALAMIN) 1000 MCG tablet   Oral   Take 1 tablet (1,000 mcg total) by mouth daily. Prefer a sublingual preparation   30 tablet   2   . vitamin B-12 (CYANOCOBALAMIN) 500 MCG tablet   Oral   Take 500 mcg by mouth daily.          BP 138/89  Pulse 86  Temp(Src) 98.3 F (36.8 C) (Oral)  Resp 18  Ht 5' (1.524 m)  Wt 95 lb (43.092 kg)  BMI 18.55 kg/m2  SpO2 97%  Physical Exam  Nursing note and vitals reviewed. Constitutional: She is oriented to person, place, and time. No distress.  Thin, frail; Appears uncomfortable  HENT:  Head: Normocephalic and atraumatic.  Right Ear: Tympanic membrane and ear canal normal.  Left Ear: Tympanic membrane and ear canal normal.  Nose: Nose normal.  Mouth/Throat: Uvula is midline, oropharynx is clear and moist and mucous membranes are normal.  No visible signs of head trauma; no hemotympanum or blood in EAC's;   Eyes: Conjunctivae and EOM are normal. Pupils are equal, round, and reactive to light.  Neck: Normal range of motion.  Cardiovascular: Normal rate, regular rhythm and normal heart sounds.   Pulmonary/Chest: Effort normal and breath sounds  normal.  Abdominal: Soft. Bowel sounds are normal.  Musculoskeletal: She exhibits no edema.       Left  hip: She exhibits decreased range of motion, decreased strength, tenderness, bony tenderness and deformity. She exhibits no swelling, no crepitus and no laceration.       Right forearm: She exhibits tenderness, bony tenderness, swelling and deformity.       Legs: Diffuse TTP of left hip; leg internally rotated and foreshortened approx 0.5-1 inch, strong distal pulse, sensation intact   TTP and large amount of bruising to right forearm; strong radial pulse, sensation intact  Neurological: She is alert and oriented to person, place, and time. She displays no tremor. No cranial nerve deficit or sensory deficit. She displays no seizure activity.  AAOx3, CN grossly intact, answering all questions appropriately, following commands, moves all extremities without ataxia  Skin: Skin is warm and dry. She is not diaphoretic.  Psychiatric: She has a normal mood and affect.    ED Course  Procedures (including critical care time)   Date: 01/19/2013  Rate: 80  Rhythm: paced rhythm  QRS Axis: indeterminate  Intervals: indeterminate  ST/T Wave abnormalities: indeterminate  Conduction Disutrbances:indeterminate  Narrative Interpretation: paced rhythm  Old EKG Reviewed: unchanged   Labs Review Labs Reviewed  CBC WITH DIFFERENTIAL - Abnormal; Notable for the following:    WBC 20.0 (*)    RBC 3.12 (*)    Hemoglobin 9.7 (*)    HCT 30.3 (*)    RDW 16.2 (*)    Neutrophils Relative % 89 (*)    Neutro Abs 17.7 (*)    Lymphocytes Relative 5 (*)    Monocytes Absolute 1.2 (*)    All other components within normal limits  BASIC METABOLIC PANEL - Abnormal; Notable for the following:    Glucose, Bld 162 (*)    BUN 25 (*)    Creatinine, Ser 1.52 (*)    GFR calc non Af Amer 30 (*)    GFR calc Af Amer 35 (*)    All other components within normal limits  CK TOTAL AND CKMB  POCT I-STAT TROPONIN I    Imaging Review Dg Chest 1 View  01/19/2013   *RADIOLOGY REPORT*  Clinical Data: Low back pain post fall  CHEST - 1 VIEW  Comparison: 12/03/2012  Findings: Left subclavian transvenous pacemaker leads project over right atrium and right ventricle unchanged. Minimal enlargement of cardiac silhouette. Calcified tortuous aorta. Pulmonary vascularity normal. Bronchitic changes with interval decrease in interstitial prominence since the previous exam. No segmental infiltrate, pleural effusion or pneumothorax. Minimal bibasilar atelectasis. Healing fractures of the posterior right fourth fifth and sixth ribs.  IMPRESSION: Minimal enlargement of cardiac silhouette post pacemaker. Bronchitic changes with bibasilar atelectasis versus scarring. Decreased interstitial prominence since previous exam without definite acute infiltrate.   Original Report Authenticated By: Ulyses Southward, M.D.   Dg Lumbar Spine Complete  01/19/2013   *RADIOLOGY REPORT*  Clinical Data: Low back pain, pain down left side of buttocks, fell today  LUMBAR SPINE - COMPLETE 4+ VIEW  Comparison: None Correlation:  CT abdomen pelvis 12/10/2007  Findings: Osseous demineralization. Five non-rib bearing lumbar vertebrae. Dextroconvex thoracolumbar scoliosis apex L2. Superior endplate compression deformity of L2 with 20% height loss anteriorly. Finding is new since prior abdominal CT. Remaining vertebral body heights maintained. Disc space narrowing greatest at L4-L5 with small endplate spurs. No additional fracture, subluxation or bone destruction. Facet degenerative changes lower lumbar spine. No spondylolysis. Scattered atherosclerotic calcifications.  IMPRESSION: Superior endplate compression fracture of L2 with 20% anterior height loss, new since 2009.   Original Report Authenticated By: Ulyses Southward, M.D.  Dg Forearm Right  01/19/2013   *RADIOLOGY REPORT*  Clinical Data: History of fall complaining of right forearm pain.  RIGHT FOREARM - 2 VIEW   Comparison: No priors.  Findings: AP and lateral views of the right forearm demonstrate no definite acute displaced fractures of the radius or ulna.  Bones appear mildly osteopenic.  IMPRESSION: 1.  No acute displaced fractures of the radius or ulna.   Original Report Authenticated By: Trudie Reed, M.D.   Dg Hip Complete Left  01/19/2013   *RADIOLOGY REPORT*  Clinical Data:  Larey Seat today, low back pain, pain down left side of buttocks  LEFT HIP - COMPLETE 2+ VIEW  Comparison: Intraoperative images left hip 05/27/2012  Findings: Diffuse osseous demineralization. Orthopedic hardware proximal left femur post ORIF of an intertrochanteric fracture. Mild bilateral hip joint space narrowing. Asymmetric SI joints, likely due to rotation. No acute fracture, dislocation or bone destruction. Scattered atherosclerotic calcifications.  IMPRESSION: Osseous demineralization. Post ORIF proximal left femur. No definite acute bony abnormalities identified though the distal extent of the IM nail within the left femur is not imaged; if the patient has symptoms referable to the distal left thigh, recommend dedicated radiographs of the left femur.   Original Report Authenticated By: Ulyses Southward, M.D.   Dg Femur Left  01/19/2013   *RADIOLOGY REPORT*  Clinical Data: Left hip pain post fall  LEFT FEMUR - 2 VIEW  Comparison: Left hip radiographs 01/19/2013  Findings: Remainder of left femur is imaged. IM nail extends from the greater trochanter to the distal left femoral metaphysis. No acute fracture or dislocation. Diffuse osseous demineralization. Tricompartmental osteoarthritic changes of the left knee. Scattered atherosclerotic calcifications.  IMPRESSION: IM nail left femur. Osseous demineralization with osteoarthritic changes left knee. No acute bony abnormalities.   Original Report Authenticated By: Ulyses Southward, M.D.   Ct Head Wo Contrast  01/19/2013   *RADIOLOGY REPORT*  Clinical Data:  History of trauma from a fall.  Headache.   CT HEAD WITHOUT CONTRAST CT CERVICAL SPINE WITHOUT CONTRAST  Technique:  Multidetector CT imaging of the head and cervical spine was performed following the standard protocol without intravenous contrast.  Multiplanar CT image reconstructions of the cervical spine were also generated.  Comparison:  Head CT and cervical spine CT 02/23/2011.  CT HEAD  Findings: Mild cerebral and cerebellar atrophy.  Extensive patchy extensive patchy and confluent areas of decreased attenuation throughout the deep and periventricular white matter of the cerebral hemispheres bilaterally, compatible with chronic microvascular ischemic disease.  Physiologic calcifications of the basal ganglia bilaterally (right greater than left). No acute displaced skull fractures are identified.  No acute intracranial abnormality.  Specifically, no evidence of acute post-traumatic intracranial hemorrhage, no definite regions of acute/subacute cerebral ischemia, no focal mass, mass effect, hydrocephalus or abnormal intra or extra-axial fluid collections.  The visualized paranasal sinuses and mastoids are well pneumatized.  IMPRESSION: 1.  No acute displaced skull fractures or findings to suggest significant acute traumatic injury to the brain. 2.  Mild cerebral and cerebellar atrophy with extensive chronic microvascular ischemic changes in the cerebral white matter redemonstrated, as above.  CT CERVICAL SPINE  Findings: No acute displaced fractures of the cervical spine. Alignment is anatomic.  Prevertebral soft tissues are normal. Severe multilevel degenerative disc disease, most pronounced at C4- C5, C5-C6 and C6-C7.  Mild multilevel facet arthropathy is also noted.  Visualized portions of the upper thorax are unremarkable.  IMPRESSION: 1.  No evidence to suggest significant acute traumatic injury to the  cervical spine. 2.  Multilevel degenerative disc disease and cervical spondylosis, as above.   Original Report Authenticated By: Trudie Reed,  M.D.   Ct Cervical Spine Wo Contrast  01/19/2013   *RADIOLOGY REPORT*  Clinical Data:  History of trauma from a fall.  Headache.  CT HEAD WITHOUT CONTRAST CT CERVICAL SPINE WITHOUT CONTRAST  Technique:  Multidetector CT imaging of the head and cervical spine was performed following the standard protocol without intravenous contrast.  Multiplanar CT image reconstructions of the cervical spine were also generated.  Comparison:  Head CT and cervical spine CT 02/23/2011.  CT HEAD  Findings: Mild cerebral and cerebellar atrophy.  Extensive patchy extensive patchy and confluent areas of decreased attenuation throughout the deep and periventricular white matter of the cerebral hemispheres bilaterally, compatible with chronic microvascular ischemic disease.  Physiologic calcifications of the basal ganglia bilaterally (right greater than left). No acute displaced skull fractures are identified.  No acute intracranial abnormality.  Specifically, no evidence of acute post-traumatic intracranial hemorrhage, no definite regions of acute/subacute cerebral ischemia, no focal mass, mass effect, hydrocephalus or abnormal intra or extra-axial fluid collections.  The visualized paranasal sinuses and mastoids are well pneumatized.  IMPRESSION: 1.  No acute displaced skull fractures or findings to suggest significant acute traumatic injury to the brain. 2.  Mild cerebral and cerebellar atrophy with extensive chronic microvascular ischemic changes in the cerebral white matter redemonstrated, as above.  CT CERVICAL SPINE  Findings: No acute displaced fractures of the cervical spine. Alignment is anatomic.  Prevertebral soft tissues are normal. Severe multilevel degenerative disc disease, most pronounced at C4- C5, C5-C6 and C6-C7.  Mild multilevel facet arthropathy is also noted.  Visualized portions of the upper thorax are unremarkable.  IMPRESSION: 1.  No evidence to suggest significant acute traumatic injury to the cervical spine. 2.   Multilevel degenerative disc disease and cervical spondylosis, as above.   Original Report Authenticated By: Trudie Reed, M.D.   Ct Hip Left Wo Contrast  01/19/2013   *RADIOLOGY REPORT*  Clinical Data: Left hip pain post fall, negative radiographs, unable to bear weight  CT OF THE LEFT HIP WITHOUT CONTRAST  Technique:  Multidetector CT imaging was performed according to the standard protocol. Multiplanar CT image reconstructions were also generated.  Comparison: Radiographs 01/19/2013  Findings: IM nail with blade extending into the left femoral head post ORIF of an intertrochanteric fracture. Diffuse osseous demineralization. Visualized pelvis appears intact. Healed intertrochanteric fracture identified. No acute fracture or dislocation. Extensive atherosclerotic calcification. Soft tissue planes unremarkable.  IMPRESSION: Osseous demineralization. Post nailing of an intertrochanteric fracture of the left femur, which appears healed. No acute bony abnormalities identified.   Original Report Authenticated By: Ulyses Southward, M.D.    MDM   1. Left hip pain   2. Atrioventricular block, complete   3. Compression fracture   4. Diabetes mellitus     EKG unchanged, paced rhythm.  Trop negative.  Labs and imaging as above-- L2 compression fx new from prior scans.  Left hip x-ray cannot definitively rule out fx due to hardware.  CT hip ordered for further eval as pt has pacemaker and cannot have MRI.  Attempted to ambulate pt, she could not tolerate it due to pain.  Consulted hospitalist, Dr. Conley Rolls who will admit.  Garlon Hatchet, PA-C 01/20/13 0005

## 2013-01-19 NOTE — ED Notes (Signed)
Patient is resting comfortably. 

## 2013-01-19 NOTE — Progress Notes (Signed)
EDCM spoke to patient and family at bedside.  Patient's grand daughter [name] at bedside.  As per patient, she lives by herself but has a caretaker.  Patient states her caretaker comes a couple hours a day at different times of the day.  For instance, today she was at the patient's house from 930am to 330pm.  Patient states her caretaker has her own company but does not know the name of it.  Patient reports her caretaker, "Takes me where I need to go.  She takes me to my doctor's appointments."  Patient reports she has a walker and a shower chair at home.  Patient reports she is usually able to wash, dress and feed herself without any difficulty.  Patient reports she uses her walker to ambulate at home.  EDCM provided patient information on [facility]ACE, home health services and provided list of private duty nurses.  Expalined to patient and family, with home health services she will recieve an Charity fundraiser, PT, OT, nursing aide and Child psychotherapist.  A social is put in place incase patient decides she would like to be placed into a rehab or SNF.  Patient and family thankful for resources.  Information given to patient's grand daughter [name].  Patient is aware.  No further needs at this time.

## 2013-01-20 DIAGNOSIS — N189 Chronic kidney disease, unspecified: Secondary | ICD-10-CM

## 2013-01-20 DIAGNOSIS — E039 Hypothyroidism, unspecified: Secondary | ICD-10-CM

## 2013-01-20 DIAGNOSIS — I951 Orthostatic hypotension: Secondary | ICD-10-CM

## 2013-01-20 DIAGNOSIS — W19XXXA Unspecified fall, initial encounter: Secondary | ICD-10-CM

## 2013-01-20 DIAGNOSIS — N390 Urinary tract infection, site not specified: Secondary | ICD-10-CM

## 2013-01-20 LAB — CREATININE, SERUM: Creatinine, Ser: 1.22 mg/dL — ABNORMAL HIGH (ref 0.50–1.10)

## 2013-01-20 LAB — CBC
MCH: 31.1 pg (ref 26.0–34.0)
MCHC: 31.8 g/dL (ref 30.0–36.0)
MCV: 97.8 fL (ref 78.0–100.0)
Platelets: 141 10*3/uL — ABNORMAL LOW (ref 150–400)
RDW: 16.3 % — ABNORMAL HIGH (ref 11.5–15.5)

## 2013-01-20 LAB — GLUCOSE, CAPILLARY: Glucose-Capillary: 271 mg/dL — ABNORMAL HIGH (ref 70–99)

## 2013-01-20 MED ORDER — TRAMADOL HCL 50 MG PO TABS
50.0000 mg | ORAL_TABLET | Freq: Four times a day (QID) | ORAL | Status: DC | PRN
  Administered 2013-01-20 – 2013-01-22 (×4): 50 mg via ORAL
  Filled 2013-01-20 (×4): qty 1

## 2013-01-20 MED ORDER — ACETAMINOPHEN 325 MG PO TABS
650.0000 mg | ORAL_TABLET | Freq: Four times a day (QID) | ORAL | Status: DC | PRN
Start: 2013-01-20 — End: 2013-01-23

## 2013-01-20 MED ORDER — INSULIN ASPART 100 UNIT/ML ~~LOC~~ SOLN
0.0000 [IU] | Freq: Three times a day (TID) | SUBCUTANEOUS | Status: DC
  Administered 2013-01-20: 2 [IU] via SUBCUTANEOUS
  Administered 2013-01-20: 5 [IU] via SUBCUTANEOUS
  Administered 2013-01-21 (×2): 2 [IU] via SUBCUTANEOUS
  Administered 2013-01-21: 3 [IU] via SUBCUTANEOUS
  Administered 2013-01-22 (×2): 2 [IU] via SUBCUTANEOUS
  Administered 2013-01-22: 3 [IU] via SUBCUTANEOUS
  Administered 2013-01-23: 2 [IU] via SUBCUTANEOUS
  Administered 2013-01-23: 5 [IU] via SUBCUTANEOUS

## 2013-01-20 NOTE — Progress Notes (Addendum)
[facility] PROGRESS NOTE  [name] WUJ:[id number] DOB: 07-04-1923 DOA: 01/19/2013 PCP: Danise Edge, MD  HPI/Brief narrative 79 y.o. female with hx of MDS, PVD, DM, CAD, hypothyroidism, HTN, hyperlipidemia, AVB s/p PPM (no MRI), lives alone with home health aids, fell today doing laudry, hit her head, presents to the ER with back pain, and bilateral hip pain left greater than right. She has had left hip fracture in 2/14 s/p ORIF. Work up in the ER showed WBC of 20K, CT head, cervical spine was negative. X ray of lumbar, hips, femurs, right forearm, showed no acute Fx, except compression Fx of 20 percent L2 new since 2009. Her Cr is 1.5. Attempting to ambulate her, she has severe pain in both hips, with left greater than right. Hospitalist was asked to admit her for further work up and disposition, since she lives alone and cannot ambulate due to pain. CT of left hip negative for fractures. Patient denies syncopal episodes but states has had multiple falls-5? Over unknown period of time.   Assessment/Plan:  Bilateral hips pain (L>R), recurrent falls - No fractures on imaging. - Pain management - PT & OT evaluation - May require SNF. -  May be secondary to gait instability, arthritis. - We'll check urine microscopy to rule out UTI. - Check orthostatic BP's   Type II DM - Reasonable inpatient control. - Continue SS I - Hemoglobin A1c on 12/03/12:6.5  Hypothyroidism - Continue Synthroid - TSH on 12/03/12:0.405  Stage III chronic kidney disease - Creatinine slightly higher than baseline. Baseline 1.3. - Follow BMP in a.m.  Chronic anemia/leukocytosis/MDS - Stable. - Outpatient followup with oncology.  CAD/PPM - Asymptomatic of chest pain.  Hypertension - Mildly uncontrolled  L2 compression fracture - Not complaining of pain in that region.  DVT prophylaxis: Heparin Lines/catheters: PIV Nutrition: Diabetic diet  Activity:  Up with assistance Code Status:  Full Family Communication: Discussed with Mr. Schuyler Behan. Disposition Plan: To be determined. Possibly SNF.   Consultants:  None  Procedures:  None  Antibiotics:  None   Subjective: Patient states that she feels "okay". Claims that she has had 5 falls but unable to say over what period of time. Denies dizziness, lightheadedness or episodes of passing out. Per nursing, patient was given pain medications early this morning and has been slightly somnolent.  Objective: Filed Vitals:   01/19/13 1918 01/19/13 1922 01/20/13 0023 01/20/13 0610  BP: 138/89 138/89 154/67 160/78  Pulse: 81 86 79 83  Temp:  98.3 F (36.8 C) 99 F (37.2 C) 98.2 F (36.8 C)  TempSrc: Oral Oral Oral Oral  Resp: 16 18 18 18   Height: 5' (1.524 m)  5' (1.524 m)   Weight: 43.092 kg (95 lb)  43.092 kg (95 lb)   SpO2: 100% 97% 98% 97%   No intake or output data in the 24 hours ending 01/20/13 1024 Filed Weights   01/19/13 1918 01/20/13 0023  Weight: 43.092 kg (95 lb) 43.092 kg (95 lb)     Exam:  General exam: Moderately built and thinly nourished frail elderly female lying comfortably supine in bed Respiratory system: Poor inspiratory effort but seems clear to auscultation. No increased work of breathing. Cardiovascular system: S1 & S2 heard, RRR. No JVD, murmurs, gallops, clicks or pedal edema. Gastrointestinal system: Abdomen is nondistended, soft and nontender. Normal bowel sounds heard. Central nervous system: Slightly drowsy but easily arousable to call and oriented x2. No focal neurological deficits. Extremities: Symmetric 5 x 5 power.  Data Reviewed: Basic Metabolic Panel:  Recent Labs Lab 01/19/13 1949 01/20/13 0556  NA 135  --   K 4.1  --   CL 96  --   CO2 30  --   GLUCOSE 162*  --   BUN 25*  --   CREATININE 1.52* 1.22*  CALCIUM 9.4  --    Liver Function Tests: No results found for this basename: AST, ALT, ALKPHOS, BILITOT, PROT, ALBUMIN,  in the last 168 hours No results  found for this basename: LIPASE, AMYLASE,  in the last 168 hours No results found for this basename: AMMONIA,  in the last 168 hours CBC:  Recent Labs Lab 01/19/13 1949 01/20/13 0556  WBC 20.0* 16.1*  NEUTROABS 17.7*  --   HGB 9.7* 9.7*  HCT 30.3* 30.5*  MCV 97.1 97.8  PLT 173 141*   Cardiac Enzymes:  Recent Labs Lab 01/19/13 1919  CKTOTAL 97  CKMB 2.7   BNP (last 3 results)  Recent Labs  04/20/12 1226 12/03/12 0825  PROBNP 113.0* 2120.0*   CBG: No results found for this basename: GLUCAP,  in the last 168 hours  No results found for this or any previous visit (from the past 240 hour(s)).    Additional labs: 1. None     Studies: Dg Chest 1 View  01/19/2013   *RADIOLOGY REPORT*  Clinical Data: Low back pain post fall  CHEST - 1 VIEW  Comparison: 12/03/2012  Findings: Left subclavian transvenous pacemaker leads project over right atrium and right ventricle unchanged. Minimal enlargement of cardiac silhouette. Calcified tortuous aorta. Pulmonary vascularity normal. Bronchitic changes with interval decrease in interstitial prominence since the previous exam. No segmental infiltrate, pleural effusion or pneumothorax. Minimal bibasilar atelectasis. Healing fractures of the posterior right fourth fifth and sixth ribs.  IMPRESSION: Minimal enlargement of cardiac silhouette post pacemaker. Bronchitic changes with bibasilar atelectasis versus scarring. Decreased interstitial prominence since previous exam without definite acute infiltrate.   Original Report Authenticated By: Ulyses Southward, M.D.   Dg Lumbar Spine Complete  01/19/2013   *RADIOLOGY REPORT*  Clinical Data: Low back pain, pain down left side of buttocks, fell today  LUMBAR SPINE - COMPLETE 4+ VIEW  Comparison: None Correlation:  CT abdomen pelvis 12/10/2007  Findings: Osseous demineralization. Five non-rib bearing lumbar vertebrae. Dextroconvex thoracolumbar scoliosis apex L2. Superior endplate compression deformity of L2  with 20% height loss anteriorly. Finding is new since prior abdominal CT. Remaining vertebral body heights maintained. Disc space narrowing greatest at L4-L5 with small endplate spurs. No additional fracture, subluxation or bone destruction. Facet degenerative changes lower lumbar spine. No spondylolysis. Scattered atherosclerotic calcifications.  IMPRESSION: Superior endplate compression fracture of L2 with 20% anterior height loss, new since 2009.   Original Report Authenticated By: Ulyses Southward, M.D.   Dg Forearm Right  01/19/2013   *RADIOLOGY REPORT*  Clinical Data: History of fall complaining of right forearm pain.  RIGHT FOREARM - 2 VIEW  Comparison: No priors.  Findings: AP and lateral views of the right forearm demonstrate no definite acute displaced fractures of the radius or ulna.  Bones appear mildly osteopenic.  IMPRESSION: 1.  No acute displaced fractures of the radius or ulna.   Original Report Authenticated By: Trudie Reed, M.D.   Dg Hip Complete Left  01/19/2013   *RADIOLOGY REPORT*  Clinical Data:  Larey Seat today, low back pain, pain down left side of buttocks  LEFT HIP - COMPLETE 2+ VIEW  Comparison: Intraoperative images left hip 05/27/2012  Findings: Diffuse osseous  demineralization. Orthopedic hardware proximal left femur post ORIF of an intertrochanteric fracture. Mild bilateral hip joint space narrowing. Asymmetric SI joints, likely due to rotation. No acute fracture, dislocation or bone destruction. Scattered atherosclerotic calcifications.  IMPRESSION: Osseous demineralization. Post ORIF proximal left femur. No definite acute bony abnormalities identified though the distal extent of the IM nail within the left femur is not imaged; if the patient has symptoms referable to the distal left thigh, recommend dedicated radiographs of the left femur.   Original Report Authenticated By: Ulyses Southward, M.D.   Dg Femur Left  01/19/2013   *RADIOLOGY REPORT*  Clinical Data: Left hip pain post fall   LEFT FEMUR - 2 VIEW  Comparison: Left hip radiographs 01/19/2013  Findings: Remainder of left femur is imaged. IM nail extends from the greater trochanter to the distal left femoral metaphysis. No acute fracture or dislocation. Diffuse osseous demineralization. Tricompartmental osteoarthritic changes of the left knee. Scattered atherosclerotic calcifications.  IMPRESSION: IM nail left femur. Osseous demineralization with osteoarthritic changes left knee. No acute bony abnormalities.   Original Report Authenticated By: Ulyses Southward, M.D.   Ct Head Wo Contrast  01/19/2013   *RADIOLOGY REPORT*  Clinical Data:  History of trauma from a fall.  Headache.  CT HEAD WITHOUT CONTRAST CT CERVICAL SPINE WITHOUT CONTRAST  Technique:  Multidetector CT imaging of the head and cervical spine was performed following the standard protocol without intravenous contrast.  Multiplanar CT image reconstructions of the cervical spine were also generated.  Comparison:  Head CT and cervical spine CT 02/23/2011.  CT HEAD  Findings: Mild cerebral and cerebellar atrophy.  Extensive patchy extensive patchy and confluent areas of decreased attenuation throughout the deep and periventricular white matter of the cerebral hemispheres bilaterally, compatible with chronic microvascular ischemic disease.  Physiologic calcifications of the basal ganglia bilaterally (right greater than left). No acute displaced skull fractures are identified.  No acute intracranial abnormality.  Specifically, no evidence of acute post-traumatic intracranial hemorrhage, no definite regions of acute/subacute cerebral ischemia, no focal mass, mass effect, hydrocephalus or abnormal intra or extra-axial fluid collections.  The visualized paranasal sinuses and mastoids are well pneumatized.  IMPRESSION: 1.  No acute displaced skull fractures or findings to suggest significant acute traumatic injury to the brain. 2.  Mild cerebral and cerebellar atrophy with extensive chronic  microvascular ischemic changes in the cerebral white matter redemonstrated, as above.  CT CERVICAL SPINE  Findings: No acute displaced fractures of the cervical spine. Alignment is anatomic.  Prevertebral soft tissues are normal. Severe multilevel degenerative disc disease, most pronounced at C4- C5, C5-C6 and C6-C7.  Mild multilevel facet arthropathy is also noted.  Visualized portions of the upper thorax are unremarkable.  IMPRESSION: 1.  No evidence to suggest significant acute traumatic injury to the cervical spine. 2.  Multilevel degenerative disc disease and cervical spondylosis, as above.   Original Report Authenticated By: Trudie Reed, M.D.   Ct Cervical Spine Wo Contrast  01/19/2013   *RADIOLOGY REPORT*  Clinical Data:  History of trauma from a fall.  Headache.  CT HEAD WITHOUT CONTRAST CT CERVICAL SPINE WITHOUT CONTRAST  Technique:  Multidetector CT imaging of the head and cervical spine was performed following the standard protocol without intravenous contrast.  Multiplanar CT image reconstructions of the cervical spine were also generated.  Comparison:  Head CT and cervical spine CT 02/23/2011.  CT HEAD  Findings: Mild cerebral and cerebellar atrophy.  Extensive patchy extensive patchy and confluent areas of decreased attenuation throughout the  deep and periventricular white matter of the cerebral hemispheres bilaterally, compatible with chronic microvascular ischemic disease.  Physiologic calcifications of the basal ganglia bilaterally (right greater than left). No acute displaced skull fractures are identified.  No acute intracranial abnormality.  Specifically, no evidence of acute post-traumatic intracranial hemorrhage, no definite regions of acute/subacute cerebral ischemia, no focal mass, mass effect, hydrocephalus or abnormal intra or extra-axial fluid collections.  The visualized paranasal sinuses and mastoids are well pneumatized.  IMPRESSION: 1.  No acute displaced skull fractures or  findings to suggest significant acute traumatic injury to the brain. 2.  Mild cerebral and cerebellar atrophy with extensive chronic microvascular ischemic changes in the cerebral white matter redemonstrated, as above.  CT CERVICAL SPINE  Findings: No acute displaced fractures of the cervical spine. Alignment is anatomic.  Prevertebral soft tissues are normal. Severe multilevel degenerative disc disease, most pronounced at C4- C5, C5-C6 and C6-C7.  Mild multilevel facet arthropathy is also noted.  Visualized portions of the upper thorax are unremarkable.  IMPRESSION: 1.  No evidence to suggest significant acute traumatic injury to the cervical spine. 2.  Multilevel degenerative disc disease and cervical spondylosis, as above.   Original Report Authenticated By: Trudie Reed, M.D.   Ct Hip Left Wo Contrast  01/19/2013   *RADIOLOGY REPORT*  Clinical Data: Left hip pain post fall, negative radiographs, unable to bear weight  CT OF THE LEFT HIP WITHOUT CONTRAST  Technique:  Multidetector CT imaging was performed according to the standard protocol. Multiplanar CT image reconstructions were also generated.  Comparison: Radiographs 01/19/2013  Findings: IM nail with blade extending into the left femoral head post ORIF of an intertrochanteric fracture. Diffuse osseous demineralization. Visualized pelvis appears intact. Healed intertrochanteric fracture identified. No acute fracture or dislocation. Extensive atherosclerotic calcification. Soft tissue planes unremarkable.  IMPRESSION: Osseous demineralization. Post nailing of an intertrochanteric fracture of the left femur, which appears healed. No acute bony abnormalities identified.   Original Report Authenticated By: Ulyses Southward, M.D.        Scheduled Meds: . aspirin EC  81 mg Oral Daily  . atorvastatin  40 mg Oral q1800  . bacitracin-polymyxin b   Left Eye Q12H  . cyanocobalamin  500 mcg Oral Daily  . darifenacin  7.5 mg Oral Daily  . docusate sodium  100  mg Oral BID  . heparin  5,000 Units Subcutaneous Q8H  . levothyroxine  112 mcg Oral QAC breakfast  . pantoprazole  40 mg Oral Daily  . PARoxetine  20 mg Oral Q24H   Continuous Infusions:   Active Problems:   HYPOTHYROIDISM   Atrioventricular block, complete   HYPOTENSION, ORTHOSTATIC   RENAL FAILURE, CHRONIC   Myelodysplastic syndrome   Diabetes mellitus   Pacemaker-[employer]   Left hip pain   Compression fracture    Time spent: 45 minutes.    [facility]  [facility] Pager [phone].   If 8PM-8AM, please contact night-coverage at [email or web address], password [facility] 01/20/2013, 10:24 AM  LOS: 1 day

## 2013-01-20 NOTE — ED Provider Notes (Signed)
Shared service with midlevel provider. I have personally seen and examined the patient, providing direct face to face care, presenting with the chief complaint of fall, and hip pain. Physical exam findings include left hip tenderness, with xrays normal. Pt couldn't ambulate. Not a candidate for MRI. Suspect possible occult fractures. Plan will be admitted. I have reviewed the nursing documentation on past medical history, family history, and social history.   Derwood Kaplan, MD 01/20/13 1630

## 2013-01-20 NOTE — Evaluation (Signed)
Physical Therapy Evaluation Patient Details Name: [name] MRN: [id number] DOB: 06/02/1923 Today's Date: 01/20/2013 Time: 0902-0925 PT Time Calculation (min): 23 min  PT Assessment / Plan / Recommendation History of Present Illness  [name] is an 79 y.o. female with hx of MDS, PVD, DM, CAD, hypothyroidism, HTN, hyperlipidemia, AVB s/p PPM (no MRI), lives alone with home health aids, fell today doing laudry, hit her head, presents to the ER with back pain, and bilateral hip pain left greater than right.  She has had left hip fracture in 2/14 s/p ORIF.  Work up in the ER showed WBC of 20K, CT head, cervical spine was negative.  X ray of lumbar, hips, femurs, right forearm, showed no acute Fx, except compression Fx of 20 percent L2 new since 2009.  Her Cr is 1.5.  Attempting to ambulate her, she has severe pain in both hips, with left greater than right.  Hospitalist was asked to admit her for further work up and disposition, since she lives alone and cannot ambulate due to pain  Clinical Impression  Pt in with pain after sustaining fall at home yesterday. Presents with great amount of pain across low back area with very little movement and difficulty with coming upright with standing this morning. Will recommend to try mobility againwith OT today. Spoke with dtr (PAt ) [place] beach (but willing to come home at any moment if needed). Pt's son also lives beside his mother and can assist, but works. Recommended K-pad to assist with pain, but will just need time, family open to ST-SNF , but would prefer to head home or stay in hospital a few days then DC home if able. Pt limited with all mobility and to benefit from PT to return home at [facility] /ModI level with RW.     PT Assessment  Patient needs continued PT services    Follow Up Recommendations  Home health PT (if progresses, if not family will consider ST-SNF)    Does the patient have the potential to tolerate intense  rehabilitation      Barriers to Discharge        Equipment Recommendations  None recommended by PT (pt has equioment already)    Recommendations for Other Services     Frequency Min 3X/week    Precautions / Restrictions Restrictions Weight Bearing Restrictions: No   Pertinent Vitals/Pain High level of pain (grimaces, etc) unable to rate, with little movement. Prefers hook lying position in bed to help with back pain.        Mobility  Bed Mobility Bed Mobility: Supine to Sit;Sit to Supine Supine to Sit: 3: Mod assist;With rails (limited by back pain across entire lumbar) Sit to Supine: 2: Max assist;With rail (because back pain) Details for Bed Mobility Assistance: Pt looks able, however having true low back pain across lumbar area with very little movement Transfers Transfers: Sit to Stand;Stand to Sit Sit to Stand: 3: Mod assist;With upper extremity assist;From bed Stand to Sit: 3: Mod assist;To elevated surface;With upper extremity assist Details for Transfer Assistance: limited due to increase pain in LB with weight bearig and upright posture Ambulation/Gait Ambulation/Gait Assistance: Not tested (comment) (unable to attempt due to pain)    Exercises     PT Diagnosis: Difficulty walking;Acute pain  PT Problem List: Decreased range of motion;Decreased activity tolerance;Decreased mobility PT Treatment Interventions: Gait training;Functional mobility training;Therapeutic activities;Therapeutic exercise;Patient/family education     PT Goals(Current goals can be found in the  care plan section) Acute Rehab PT Goals Patient Stated Goal: To return home PT Goal Formulation: With patient Time For Goal Achievement: 02/03/13 Potential to Achieve Goals: Good  Visit Information  Last PT Received On: 01/20/13 Assistance Needed: +1 History of Present Illness: [name] is an 79 y.o. female with hx of MDS, PVD, DM, CAD, hypothyroidism, HTN, hyperlipidemia, AVB s/p PPM (no  MRI), lives alone with home health aids, fell today doing laudry, hit her head, presents to the ER with back pain, and bilateral hip pain left greater than right.  She has had left hip fracture in 2/14 s/p ORIF.  Work up in the ER showed WBC of 20K, CT head, cervical spine was negative.  X ray of lumbar, hips, femurs, right forearm, showed no acute Fx, except compression Fx of 20 percent L2 new since 2009.  Her Cr is 1.5.  Attempting to ambulate her, she has severe pain in both hips, with left greater than right.  Hospitalist was asked to admit her for further work up and disposition, since she lives alone and cannot ambulate due to pain       Prior Functioning  Home Living Family/patient expects to be discharged to:: Private residence Living Arrangements: Alone Available Help at Discharge: Family;Available PRN/intermittently;Personal care attendant Type of Home: House Home Access: Stairs to enter [employer] of Steps: 2 from driveway->porch, 1 porch ->sunroom Entrance Stairs-Rails: Left;Right Home Layout: One level Home Equipment: Environmental consultant - 2 wheels;Bedside commode;Tub bench;Gilmer Mor - single point Additional Comments: recent hospitalizaton in Feb due to hip fracture ORIF and rehab, and currently was doing so much better at home walking on RW in home and community. family can provide support intermittantly if need adn has caregiver and was just active with [facility] with PT/OT.  Prior Function Level of Independence: Independent with assistive device(s) (was back to doing absic ADLs and amb in home ModI ) Comments: not driving anymore, but just had asked dtr if she could drive again, she was doing that well.  Communication Communication: No difficulties (was groggy from pain meds)    Cognition  Cognition Arousal/Alertness: Awake/alert (groggy from pain meds) Behavior During Therapy: WFL for tasks assessed/performed Overall Cognitive Status: Within Functional Limits for tasks  assessed Memory:  (having difficulty recalling  fall)    Extremity/Trunk Assessment Lower Extremity Assessment Lower Extremity Assessment: Difficult to assess due to impaired cognition   Balance    End of Session PT - End of Session Equipment Utilized During Treatment: Gait belt Activity Tolerance: Patient limited by pain Patient left: in bed;with nursing/sitter in room;with bed alarm set Nurse Communication: Mobility status  GP Functional Assessment Tool Used: clincial judgement Functional Limitation: Mobility: Walking and moving around Mobility: Walking and Moving Around Current Status (Z6109): 100 percent impaired, limited or restricted Mobility: Walking and Moving Around Goal Status (U0454): At least 1 percent but less than 20 percent impaired, limited or restricted   Marella Bile 01/20/2013, 9:57 AM Marella Bile, PT Pager: [phone] 01/20/2013

## 2013-01-20 NOTE — Evaluation (Addendum)
Occupational Therapy Evaluation Patient Details Name: [name] MRN: [id number] DOB: 1924-04-10 Today's Date: 01/20/2013 Time: 2951-8841 OT Time Calculation (min): 20 min  OT Assessment / Plan / Recommendation History of present illness Pt was admitted after fall, hitting her head.  At baseline, she lives alone with [name] Aides.  CT and xrays of multiple bones were negative, however, L2 compression fx has changed since 2009.     Clinical Impression   Pt was admitted for the above.  She will benefit from skilled OT to increase activity tolerance and increase independence with adls.  Goals in acute are for mod A.      OT Assessment  Patient needs continued OT Services    Follow Up Recommendations  SNF;Supervision/Assistance - 24 hour    Barriers to Discharge      Equipment Recommendations  None recommended by OT    Recommendations for Other Services    Frequency  Min 2X/week    Precautions / Restrictions Precautions Precautions: Fall Restrictions Weight Bearing Restrictions: No   Pertinent Vitals/Pain Back pain 10/10 after therapy. Requested pain medication    ADL  Eating/Feeding: Set up Where Assessed - Eating/Feeding: Bed level Grooming: Set up Where Assessed - Grooming: Supine, head of bed up Upper Body Bathing: Minimal assistance Where Assessed - Upper Body Bathing: Supine, head of bed up Lower Body Bathing: Maximal assistance Where Assessed - Lower Body Bathing: Rolling right and/or left Upper Body Dressing: Minimal assistance Where Assessed - Upper Body Dressing: Supine, head of bed up;Rolling right and/or left Lower Body Dressing: +1 Total assistance Where Assessed - Lower Body Dressing: Rolling right and/or left Toilet Transfer:  (unable) Toileting - Clothing Manipulation and Hygiene: +2 Total assistance Toileting - Clothing Manipulation and Hygiene: Patient Percentage: 0% Where Assessed - Toileting Clothing Manipulation and Hygiene: Sit to stand from 3-in-1  or toilet Equipment Used: Rolling walker Transfers/Ambulation Related to ADLs: Pt performed bed mobility and briefly sit to stand for toilet hygiene as she was incontinent.  Only stood about 30 seconds and legs were weak/shaking.  Pt very agreeable to trying but she did not tolerate this well.   ADL Comments: ADLs best accomplished from supine position due to back pain.  Does not tolerate HOB above 30 degrees.  Did not introduce AE on this visit    OT Diagnosis: Generalized weakness  OT Problem List: Decreased strength;Pain;Decreased activity tolerance;Impaired balance (sitting and/or standing);Decreased knowledge of use of DME or AE;Decreased knowledge of precautions OT Treatment Interventions: Self-care/ADL training;DME and/or AE instruction;Patient/family education;Balance training   OT Goals(Current goals can be found in the care plan section) Acute Rehab OT Goals OT Goal Formulation: With patient Time For Goal Achievement: 02/03/13 Potential to Achieve Goals: Good ADL Goals Pt Will Perform Lower Body Bathing: with mod assist;with adaptive equipment;sit to/from stand Pt Will Perform Lower Body Dressing: with mod assist;with adaptive equipment;sit to/from stand Pt Will Transfer to Toilet: with mod assist;stand pivot transfer;bedside commode Additional ADL Goal #1: pt will tolerate sitting eob for 5 minutes with light adl activity  Visit Information  Last OT Received On: 01/20/13 Assistance Needed: +1 History of Present Illness: Pt was admitted after fall, hitting her head.  At baseline, she lives alone with [name] Aides.  CT and xrays of multiple bones were negative, however, L2 compression fx has changed since 2009.         Prior Functioning     Home Living Family/patient expects to be discharged to:: Private residence Living Arrangements: Alone Home  Equipment: Dan Humphreys - 2 wheels;Bedside commode;Tub bench;Cane - single point Prior Function Level of Independence: Independent with  assistive device(s) Comments: not driving anymore, but just had asked dtr if she could drive again, she was doing that well.  Communication Communication: No difficulties         Vision/Perception     Cognition  Cognition Arousal/Alertness: Awake/alert Behavior During Therapy: WFL for tasks assessed/performed Overall Cognitive Status: Within Functional Limits for tasks assessed    Extremity/Trunk Assessment Upper Extremity Assessment Upper Extremity Assessment: Generalized weakness     Mobility Bed Mobility Supine to Sit: 3: Mod assist;HOB flat;With rails Sit to Supine: 1: +2 Total assist Sit to Supine: Patient Percentage: 30% Details for Bed Mobility Assistance: pt limited by pain Transfers Sit to Stand: 3: Mod assist;With upper extremity assist;From bed Stand to Sit: 3: Mod assist;To elevated surface;With upper extremity assist Details for Transfer Assistance: limited due to pain Educated on log rolling and coming up with back straight to minimize back discomfort    Exercise     Balance     End of Session OT - End of Session Activity Tolerance: Patient limited by pain;Patient limited by fatigue Patient left: in bed;with call bell/phone within reach;with bed alarm set;with family/visitor present Nurse Communication: Patient requests pain meds  GO Functional Assessment Tool Used: clinical observation/judgement Functional Limitation: Self care Self Care Current Status (W0981): 100 percent impaired, limited or restricted Self Care Goal Status (X9147): At least 40 percent but less than 60 percent impaired, limited or restricted   [name] 01/20/2013, 4:50 PM [name], OTR/L [phone] 01/20/2013

## 2013-01-21 DIAGNOSIS — E440 Moderate protein-calorie malnutrition: Secondary | ICD-10-CM | POA: Insufficient documentation

## 2013-01-21 LAB — BASIC METABOLIC PANEL
CO2: 31 mEq/L (ref 19–32)
Chloride: 94 mEq/L — ABNORMAL LOW (ref 96–112)
Sodium: 134 mEq/L — ABNORMAL LOW (ref 135–145)

## 2013-01-21 LAB — CBC
Hemoglobin: 9.3 g/dL — ABNORMAL LOW (ref 12.0–15.0)
MCV: 97 fL (ref 78.0–100.0)
Platelets: 126 10*3/uL — ABNORMAL LOW (ref 150–400)
RBC: 3.02 MIL/uL — ABNORMAL LOW (ref 3.87–5.11)
RDW: 16.2 % — ABNORMAL HIGH (ref 11.5–15.5)
WBC: 17.4 10*3/uL — ABNORMAL HIGH (ref 4.0–10.5)

## 2013-01-21 LAB — GLUCOSE, CAPILLARY
Glucose-Capillary: 132 mg/dL — ABNORMAL HIGH (ref 70–99)
Glucose-Capillary: 159 mg/dL — ABNORMAL HIGH (ref 70–99)
Glucose-Capillary: 167 mg/dL — ABNORMAL HIGH (ref 70–99)
Glucose-Capillary: 194 mg/dL — ABNORMAL HIGH (ref 70–99)

## 2013-01-21 MED ORDER — BOOST PLUS PO LIQD
237.0000 mL | Freq: Three times a day (TID) | ORAL | Status: DC
  Administered 2013-01-21 – 2013-01-23 (×5): 237 mL via ORAL
  Filled 2013-01-21 (×8): qty 237

## 2013-01-21 NOTE — Progress Notes (Signed)
INITIAL NUTRITION ASSESSMENT  Patient meets the criteria for non-severe MALNUTRITION in the context of chronic illness with 5% weight loss in 3 months, PO intake <75% of estimated needs, and moderate fat and muscle tissue loss.   DOCUMENTATION CODES Per approved criteria  -Non-severe (moderate) malnutrition in the context of chronic illness   INTERVENTION: 1. Boost Plus TID between meals. 8 oz provides  2. Patient encouraged to order foods per preference from menu and aim for at least 50% intake.   NUTRITION DIAGNOSIS: Inadequate oral intake related to decreased appetite as evidenced by 25% meal intake.   Goal: Patient will meet >/=90% of estimated nutrition needs  Monitor:  PO intake, weight, labs  Reason for Assessment: Malnutrition screening  79 y.o. female  Admitting Dx: s/p Fall with hip pain  ASSESSMENT: Patient with history of CAD, chronic kidney disease, and diabetes, admitted after fall with hip pain. She reports that she has not had much of an appetite for the last few months, and has been steadily losing weight. She drinks Boost at home as needed. She has lost about 5% of her usual body weight in 3 months with moderate muscle and fat tissue loss.  Height: Ht Readings from Last 1 Encounters:  01/20/13 5' (1.524 m)    Weight: Wt Readings from Last 1 Encounters:  01/20/13 95 lb (43.092 kg)    Ideal Body Weight: 100 pounds  % Ideal Body Weight: 95%  Wt Readings from Last 10 Encounters:  01/20/13 95 lb (43.092 kg)  12/18/12 97 lb 8 oz (44.226 kg)  12/06/12 98 lb 5.2 oz (44.6 kg)  11/21/12 96 lb (43.545 kg)  11/07/12 97 lb 6.4 oz (44.18 kg)  10/30/12 97 lb 9.6 oz (44.271 kg)  10/20/12 102 lb 12.8 oz (46.63 kg)  09/13/12 102 lb 6.4 oz (46.448 kg)  09/04/12 98 lb (44.453 kg)  07/12/12 101 lb (45.813 kg)    Usual Body Weight: 100 pounds  % Usual Body Weight: 95%  BMI:  Body mass index is 18.55 kg/(m^2).  Estimated Nutritional Needs: Kcal: 1200-1400  kcal Protein: 65-75 g Fluid: >1.6 L/d  Skin: Intact  Diet Order: Carb Control, 25%  EDUCATION NEEDS: -No education needs identified at this time  No intake or output data in the 24 hours ending 01/21/13 1150  Last BM: PTA   Labs:   Recent Labs Lab 01/19/13 1949 01/20/13 0556 01/21/13 0741  NA 135  --  134*  K 4.1  --  3.8  CL 96  --  94*  CO2 30  --  31  BUN 25*  --  17  CREATININE 1.52* 1.22* 1.09  CALCIUM 9.4  --  9.4  GLUCOSE 162*  --  170*    CBG (last 3)   Recent Labs  01/20/13 1714 01/21/13 0745 01/21/13 1105  GLUCAP 271* 159* 167*    Scheduled Meds: . aspirin EC  81 mg Oral Daily  . atorvastatin  40 mg Oral q1800  . bacitracin-polymyxin b   Left Eye Q12H  . cyanocobalamin  500 mcg Oral Daily  . darifenacin  7.5 mg Oral Daily  . docusate sodium  100 mg Oral BID  . heparin  5,000 Units Subcutaneous Q8H  . insulin aspart  0-9 Units Subcutaneous TID WC  . levothyroxine  112 mcg Oral QAC breakfast  . pantoprazole  40 mg Oral Daily  . PARoxetine  20 mg Oral Q24H    Continuous Infusions:   Past Medical History  Diagnosis Date  .  CAD (coronary artery disease)     a. 10/2007 PCI Diag w/ 2.25 x 8mm Taxus Atom DES.;  b. 09/2011 Lexi MV: EF 69%, no ischemia/infarct.  Marland Kitchen PVD (peripheral vascular disease)   . Carotid artery stenosis     Dr. Graciela Husbands  . HTN (hypertension)   . Hyperlipidemia   . Hypothyroidism   . CKD (chronic kidney disease), stage III   . Diabetes mellitus 07/16/2011  . Allergic state 07/16/2011  . Insomnia 07/16/2011  . Recurrent UTI 07/16/2011  . Asthma     childhood, recurrent at 56  . Myelodysplastic syndrome 04/02/2011  . Sinusitis 07/18/2011  . Anemia 07/18/2011  . Arthritis 07/18/2011  . Cataract extraction status of right eye 08/18/2011  . Anxiety and depression 08/18/2011  . Bronchitis, acute 12/20/2011  . Headache(784.0) 03/02/2012  . AV heart block     a. 10/2007 MDT Versa dual chamber PPM.  . Bladder cancer 12/2002  . Closed  intertrochanteric fracture of left hip 05/27/2012  . Loss of weight 09/04/2012  . Renal failure, chronic 09/05/2012  . Conjunctivitis unspecified 11/21/2012  . Frequent falls     Past Surgical History  Procedure Laterality Date  . Cholecystectomy    . Pcm - medtronic    . Abdominal hysterectomy    . Shoulder open rotator cuff repair    . Foot surgery      bunionectomy  . Intramedullary (im) nail intertrochanteric  05/27/2012    Procedure: INTRAMEDULLARY (IM) NAIL INTERTROCHANTRIC;  Surgeon: Eulas Post, MD;  Location: [facility] OR;  Service: Orthopedics;  Laterality: Left;  . Pacemaker insertion      Linnell Fulling, RD, LDN Pager #: [phone] After-Hours Pager #: [phone]

## 2013-01-21 NOTE — Progress Notes (Signed)
[facility] PROGRESS NOTE  KATHARINA JEHLE RUE:[id number] DOB: April 26, 1924 DOA: 01/19/2013 PCP: Danise Edge, MD  HPI/Brief narrative 79 y.o. female with hx of MDS, PVD, DM, CAD, GERD, IPF, hypothyroidism, HTN, hyperlipidemia, AVB s/p PPM (no MRI), lives alone with home health aids, fell on day of admission doing laundry, hit her head, presents to the ER with back pain, and bilateral hip pain left greater than right. She has had left hip fracture in 2/14 s/p ORIF. Work up in the ER showed WBC of 20K, CT head, cervical spine was negative. X ray of lumbar, hips, femurs, right forearm, showed no acute Fx, except compression Fx of 20 percent L2 new since 2009. Her Cr is 1.5. Attempting to ambulate her, she has severe pain in both hips, with left greater than right. Hospitalist was asked to admit her for further work up and disposition, since she lives alone and cannot ambulate due to pain. CT of left hip negative for fractures. Patient denies syncopal episodes but states has had multiple falls-5? Over unknown period of time. Son however & patient subsequently states that she may have passed out.   Assessment/Plan:  Bilateral hips pain (L>R), recurrent falls/Syncope - No fractures on imaging. - Pain management - PT & OT evaluation- likely STSNF - Falls may be secondary to syncope, gait instability, arthritis. - We'll check urine microscopy to rule out UTI- not sent yet. - Check orthostatic BP's- not done yet - Hip pain seems better. - Patient underwent extensive cardiac workup in early August: Cardiac enzymes negative, CT chest negative for PE, stress Myoview negative for ischemic changes. EKG: Paced rhythm. - Will request carotid Dopplers.  Type II DM - Reasonable inpatient control. - Continue SSI - Hemoglobin A1c on 12/03/12:6.5  Hypothyroidism - Continue Synthroid - TSH on 12/03/12:0.405  Stage III chronic kidney disease - Creatinine slightly higher than baseline. Baseline 1.3. -  Creatinine improved: 1.09  Chronic anemia/leukocytosis/MDS/Intermittent Thrombocytopenia - Stable. - Outpatient followup with oncology. - Follow CBC in AM  CAD/PPM - Asymptomatic of chest pain.  Hypertension - Mildly uncontrolled  L2 compression fracture - Not complaining of pain in that region.  DVT prophylaxis: Heparin Lines/catheters: PIV Nutrition: Diabetic diet  Activity:  Up with assistance Code Status: Full Family Communication: Discussed with Mr. Yola Paradiso on 9/6. Disposition Plan: To be determined. Possibly SNF.   Consultants:  None  Procedures:  None  Antibiotics:  None   Subjective: States that left hip pain is better. Today indicates that she may have passed out during episode of falls-details not clear.  Objective: Filed Vitals:   01/20/13 0610 01/20/13 1420 01/20/13 2303 01/21/13 0635  BP: 160/78 153/71 168/76 159/76  Pulse: 83 79 82 78  Temp: 98.2 F (36.8 C) 98.3 F (36.8 C) 98.3 F (36.8 C) 98.4 F (36.9 C)  TempSrc: Oral Oral Oral Oral  Resp: 18 18 18 18   Height:      Weight:      SpO2: 97% 100% 100% 97%   No intake or output data in the 24 hours ending 01/21/13 1243 Filed Weights   01/19/13 1918 01/20/13 0023  Weight: 43.092 kg (95 lb) 43.092 kg (95 lb)     Exam:  General exam: Moderately built and thinly nourished frail elderly female lying comfortably supine in bed Respiratory system: Poor inspiratory effort but seems clear to auscultation. No increased work of breathing. Cardiovascular system: S1 & S2 heard, RRR. No JVD, murmurs, gallops, clicks or pedal edema. Gastrointestinal system: Abdomen is  nondistended, soft and nontender. Normal bowel sounds heard. Central nervous system: Alert and oriented x2. No focal neurological deficits. Extremities: Symmetric 5 x 5 power.   Data Reviewed: Basic Metabolic Panel:  Recent Labs Lab 01/19/13 1949 01/20/13 0556 01/21/13 0741  NA 135  --  134*  K 4.1  --  3.8  CL 96  --   94*  CO2 30  --  31  GLUCOSE 162*  --  170*  BUN 25*  --  17  CREATININE 1.52* 1.22* 1.09  CALCIUM 9.4  --  9.4   Liver Function Tests: No results found for this basename: AST, ALT, ALKPHOS, BILITOT, PROT, ALBUMIN,  in the last 168 hours No results found for this basename: LIPASE, AMYLASE,  in the last 168 hours No results found for this basename: AMMONIA,  in the last 168 hours CBC:  Recent Labs Lab 01/19/13 1949 01/20/13 0556 01/21/13 0741  WBC 20.0* 16.1* 17.4*  NEUTROABS 17.7*  --   --   HGB 9.7* 9.7* 9.3*  HCT 30.3* 30.5* 29.3*  MCV 97.1 97.8 97.0  PLT 173 141* 126*   Cardiac Enzymes:  Recent Labs Lab 01/19/13 1919  CKTOTAL 97  CKMB 2.7   BNP (last 3 results)  Recent Labs  04/20/12 1226 12/03/12 0825  PROBNP 113.0* 2120.0*   CBG:  Recent Labs Lab 01/20/13 1209 01/20/13 1714 01/21/13 0745 01/21/13 1105  GLUCAP 186* 271* 159* 167*    No results found for this or any previous visit (from the past 240 hour(s)).    Additional labs: 1. None     Studies: Dg Chest 1 View  01/19/2013   *RADIOLOGY REPORT*  Clinical Data: Low back pain post fall  CHEST - 1 VIEW  Comparison: 12/03/2012  Findings: Left subclavian transvenous pacemaker leads project over right atrium and right ventricle unchanged. Minimal enlargement of cardiac silhouette. Calcified tortuous aorta. Pulmonary vascularity normal. Bronchitic changes with interval decrease in interstitial prominence since the previous exam. No segmental infiltrate, pleural effusion or pneumothorax. Minimal bibasilar atelectasis. Healing fractures of the posterior right fourth fifth and sixth ribs.  IMPRESSION: Minimal enlargement of cardiac silhouette post pacemaker. Bronchitic changes with bibasilar atelectasis versus scarring. Decreased interstitial prominence since previous exam without definite acute infiltrate.   Original Report Authenticated By: Ulyses Southward, M.D.   Dg Lumbar Spine Complete  01/19/2013    *RADIOLOGY REPORT*  Clinical Data: Low back pain, pain down left side of buttocks, fell today  LUMBAR SPINE - COMPLETE 4+ VIEW  Comparison: None Correlation:  CT abdomen pelvis 12/10/2007  Findings: Osseous demineralization. Five non-rib bearing lumbar vertebrae. Dextroconvex thoracolumbar scoliosis apex L2. Superior endplate compression deformity of L2 with 20% height loss anteriorly. Finding is new since prior abdominal CT. Remaining vertebral body heights maintained. Disc space narrowing greatest at L4-L5 with small endplate spurs. No additional fracture, subluxation or bone destruction. Facet degenerative changes lower lumbar spine. No spondylolysis. Scattered atherosclerotic calcifications.  IMPRESSION: Superior endplate compression fracture of L2 with 20% anterior height loss, new since 2009.   Original Report Authenticated By: Ulyses Southward, M.D.   Dg Forearm Right  01/19/2013   *RADIOLOGY REPORT*  Clinical Data: History of fall complaining of right forearm pain.  RIGHT FOREARM - 2 VIEW  Comparison: No priors.  Findings: AP and lateral views of the right forearm demonstrate no definite acute displaced fractures of the radius or ulna.  Bones appear mildly osteopenic.  IMPRESSION: 1.  No acute displaced fractures of the radius or ulna.  Original Report Authenticated By: Trudie Reed, M.D.   Dg Hip Complete Left  01/19/2013   *RADIOLOGY REPORT*  Clinical Data:  Larey Seat today, low back pain, pain down left side of buttocks  LEFT HIP - COMPLETE 2+ VIEW  Comparison: Intraoperative images left hip 05/27/2012  Findings: Diffuse osseous demineralization. Orthopedic hardware proximal left femur post ORIF of an intertrochanteric fracture. Mild bilateral hip joint space narrowing. Asymmetric SI joints, likely due to rotation. No acute fracture, dislocation or bone destruction. Scattered atherosclerotic calcifications.  IMPRESSION: Osseous demineralization. Post ORIF proximal left femur. No definite acute bony  abnormalities identified though the distal extent of the IM nail within the left femur is not imaged; if the patient has symptoms referable to the distal left thigh, recommend dedicated radiographs of the left femur.   Original Report Authenticated By: Ulyses Southward, M.D.   Dg Femur Left  01/19/2013   *RADIOLOGY REPORT*  Clinical Data: Left hip pain post fall  LEFT FEMUR - 2 VIEW  Comparison: Left hip radiographs 01/19/2013  Findings: Remainder of left femur is imaged. IM nail extends from the greater trochanter to the distal left femoral metaphysis. No acute fracture or dislocation. Diffuse osseous demineralization. Tricompartmental osteoarthritic changes of the left knee. Scattered atherosclerotic calcifications.  IMPRESSION: IM nail left femur. Osseous demineralization with osteoarthritic changes left knee. No acute bony abnormalities.   Original Report Authenticated By: Ulyses Southward, M.D.   Ct Head Wo Contrast  01/19/2013   *RADIOLOGY REPORT*  Clinical Data:  History of trauma from a fall.  Headache.  CT HEAD WITHOUT CONTRAST CT CERVICAL SPINE WITHOUT CONTRAST  Technique:  Multidetector CT imaging of the head and cervical spine was performed following the standard protocol without intravenous contrast.  Multiplanar CT image reconstructions of the cervical spine were also generated.  Comparison:  Head CT and cervical spine CT 02/23/2011.  CT HEAD  Findings: Mild cerebral and cerebellar atrophy.  Extensive patchy extensive patchy and confluent areas of decreased attenuation throughout the deep and periventricular white matter of the cerebral hemispheres bilaterally, compatible with chronic microvascular ischemic disease.  Physiologic calcifications of the basal ganglia bilaterally (right greater than left). No acute displaced skull fractures are identified.  No acute intracranial abnormality.  Specifically, no evidence of acute post-traumatic intracranial hemorrhage, no definite regions of acute/subacute cerebral  ischemia, no focal mass, mass effect, hydrocephalus or abnormal intra or extra-axial fluid collections.  The visualized paranasal sinuses and mastoids are well pneumatized.  IMPRESSION: 1.  No acute displaced skull fractures or findings to suggest significant acute traumatic injury to the brain. 2.  Mild cerebral and cerebellar atrophy with extensive chronic microvascular ischemic changes in the cerebral white matter redemonstrated, as above.  CT CERVICAL SPINE  Findings: No acute displaced fractures of the cervical spine. Alignment is anatomic.  Prevertebral soft tissues are normal. Severe multilevel degenerative disc disease, most pronounced at C4- C5, C5-C6 and C6-C7.  Mild multilevel facet arthropathy is also noted.  Visualized portions of the upper thorax are unremarkable.  IMPRESSION: 1.  No evidence to suggest significant acute traumatic injury to the cervical spine. 2.  Multilevel degenerative disc disease and cervical spondylosis, as above.   Original Report Authenticated By: Trudie Reed, M.D.   Ct Cervical Spine Wo Contrast  01/19/2013   *RADIOLOGY REPORT*  Clinical Data:  History of trauma from a fall.  Headache.  CT HEAD WITHOUT CONTRAST CT CERVICAL SPINE WITHOUT CONTRAST  Technique:  Multidetector CT imaging of the head and cervical spine was performed  following the standard protocol without intravenous contrast.  Multiplanar CT image reconstructions of the cervical spine were also generated.  Comparison:  Head CT and cervical spine CT 02/23/2011.  CT HEAD  Findings: Mild cerebral and cerebellar atrophy.  Extensive patchy extensive patchy and confluent areas of decreased attenuation throughout the deep and periventricular white matter of the cerebral hemispheres bilaterally, compatible with chronic microvascular ischemic disease.  Physiologic calcifications of the basal ganglia bilaterally (right greater than left). No acute displaced skull fractures are identified.  No acute intracranial  abnormality.  Specifically, no evidence of acute post-traumatic intracranial hemorrhage, no definite regions of acute/subacute cerebral ischemia, no focal mass, mass effect, hydrocephalus or abnormal intra or extra-axial fluid collections.  The visualized paranasal sinuses and mastoids are well pneumatized.  IMPRESSION: 1.  No acute displaced skull fractures or findings to suggest significant acute traumatic injury to the brain. 2.  Mild cerebral and cerebellar atrophy with extensive chronic microvascular ischemic changes in the cerebral white matter redemonstrated, as above.  CT CERVICAL SPINE  Findings: No acute displaced fractures of the cervical spine. Alignment is anatomic.  Prevertebral soft tissues are normal. Severe multilevel degenerative disc disease, most pronounced at C4- C5, C5-C6 and C6-C7.  Mild multilevel facet arthropathy is also noted.  Visualized portions of the upper thorax are unremarkable.  IMPRESSION: 1.  No evidence to suggest significant acute traumatic injury to the cervical spine. 2.  Multilevel degenerative disc disease and cervical spondylosis, as above.   Original Report Authenticated By: Trudie Reed, M.D.   Ct Hip Left Wo Contrast  01/19/2013   *RADIOLOGY REPORT*  Clinical Data: Left hip pain post fall, negative radiographs, unable to bear weight  CT OF THE LEFT HIP WITHOUT CONTRAST  Technique:  Multidetector CT imaging was performed according to the standard protocol. Multiplanar CT image reconstructions were also generated.  Comparison: Radiographs 01/19/2013  Findings: IM nail with blade extending into the left femoral head post ORIF of an intertrochanteric fracture. Diffuse osseous demineralization. Visualized pelvis appears intact. Healed intertrochanteric fracture identified. No acute fracture or dislocation. Extensive atherosclerotic calcification. Soft tissue planes unremarkable.  IMPRESSION: Osseous demineralization. Post nailing of an intertrochanteric fracture of the  left femur, which appears healed. No acute bony abnormalities identified.   Original Report Authenticated By: Ulyses Southward, M.D.        Scheduled Meds: . aspirin EC  81 mg Oral Daily  . atorvastatin  40 mg Oral q1800  . bacitracin-polymyxin b   Left Eye Q12H  . cyanocobalamin  500 mcg Oral Daily  . darifenacin  7.5 mg Oral Daily  . docusate sodium  100 mg Oral BID  . heparin  5,000 Units Subcutaneous Q8H  . insulin aspart  0-9 Units Subcutaneous TID WC  . lactose free nutrition  237 mL Oral TID BM  . levothyroxine  112 mcg Oral QAC breakfast  . pantoprazole  40 mg Oral Daily  . PARoxetine  20 mg Oral Q24H   Continuous Infusions:   Active Problems:   HYPOTHYROIDISM   Atrioventricular block, complete   HYPOTENSION, ORTHOSTATIC   RENAL FAILURE, CHRONIC   Myelodysplastic syndrome   Diabetes mellitus   Pacemaker-[employer]   Left hip pain   Compression fracture   Malnutrition of moderate degree    Time spent: 25 minutes.    [facility]  [facility] Pager [phone].   If 8PM-8AM, please contact night-coverage at [email or web address], password [facility] 01/21/2013, 12:43 PM  LOS: 2 days

## 2013-01-21 NOTE — Progress Notes (Signed)
Physical Therapy Treatment Patient Details Name: SAHAANA WEITMAN MRN: [id number] DOB: [birth year] Today's Date: 01/21/2013 Time: 1478-2956 PT Time Calculation (min): 23 min  PT Assessment / Plan / Recommendation  History of Present Illness Pt was admitted after fall, hitting her head.  At baseline, she lives alone with [facility] Aides.  CT and xrays of multiple bones were negative, however, L2 compression fx has changed since 2009.     PT Comments   Assisted pt OOB to amb to BR to void required + 2 assist and MAX VC's as pt appeared "foggy" and "slow" to resond and stay on task.  Very unsteady gait with severe posterioe lean with initial sit to stand and during turns.  Poor self awareness and no corrective response.  HIGH FALL RISK.  Unable to attempt amb in hallway.  Severe weakness noted after 8 feet.    Follow Up Recommendations  SNF for ST Rehab If not Home health PT and 24/7 care due to severe gait instability     Does the patient have the potential to tolerate intense rehabilitation     Barriers to Discharge        Equipment Recommendations  None recommended by PT    Recommendations for Other Services    Frequency Min 3X/week   Progress towards PT Goals Progress towards PT goals: Progressing toward goals (slowly)  Plan      Precautions / Restrictions Precautions Precautions: Fall Restrictions Weight Bearing Restrictions: No    Pertinent Vitals/Pain C/o L hip pain during act but was unable to rate    Mobility  Bed Mobility Bed Mobility: Supine to Sit Supine to Sit: 4: Min assist Details for Bed Mobility Assistance: increased, increased time woth HOB elevated 45' and repeat cueing to complete task at hand as pt repeat "Now what am I doing". Transfers Transfers: Sit to Stand;Stand to Sit Sit to Stand: 2: Max assist;From bed;From toilet Stand to Sit: 2: Max assist;To toilet;To chair/3-in-1 Details for Transfer Assistance: 75% VC's on proper tech, hand placement and to increase  forward lean to decrease posterior LOB.  Pt appeared "foggy" in judgement and required repeat cueing to complete task.  Asked pt to stand at sink and "wash your hands".  Pt was delayed in action response and required repeat instruction. Pt c/o dizzyness and MAX c/o wealness shakying throughout.  Chair brought to pt and assisted quickly to sitting.   Ambulation/Gait Ambulation/Gait Assistance: 1: +2 Total assist Ambulation/Gait: Patient Percentage: 50% Ambulation Distance (Feet): 8 Feet Assistive device: Rolling walker Ambulation/Gait Assistance Details: Total assist + 2 pt 50% very unsteady gait with tremors throughout as fatigue level increased.  Poor balance with severe posterior lean with initial sit to stand and poor self coorective responce to posterior lean/LOB during sit to stand and turns. Pt required MAX assist to advance RW correctly to complete turns and negociat through doorway as if she never used one.  HIGH FALL RISK. Gait Pattern: Step-to pattern;Narrow base of support Gait velocity: decreased     PT Goals (current goals can now be found in the care plan section)    Visit Information  Last PT Received On: 01/21/13 Assistance Needed: +1 History of Present Illness: Pt was admitted after fall, hitting her head.  At baseline, she lives alone with [facility] Aides.  CT and xrays of multiple bones were negative, however, L2 compression fx has changed since 2009.      Subjective Data   "I feel lost"   Cognition    "  foggy"   Balance   poor  End of Session PT - End of Session Equipment Utilized During Treatment: Gait belt Activity Tolerance: Other (comment) (weakness/fatigue) Patient left: in chair;with call bell/phone within reach Nurse Communication: Mobility status   Felecia Shelling  PTA [facility]  Acute  Rehab Pager      [phone]

## 2013-01-22 ENCOUNTER — Ambulatory Visit: Payer: Medicare Other | Admitting: Nurse Practitioner

## 2013-01-22 ENCOUNTER — Ambulatory Visit: Payer: Medicare Other

## 2013-01-22 ENCOUNTER — Other Ambulatory Visit: Payer: Medicare Other | Admitting: Lab

## 2013-01-22 DIAGNOSIS — R55 Syncope and collapse: Secondary | ICD-10-CM

## 2013-01-22 LAB — GLUCOSE, CAPILLARY: Glucose-Capillary: 170 mg/dL — ABNORMAL HIGH (ref 70–99)

## 2013-01-22 LAB — URINALYSIS, ROUTINE W REFLEX MICROSCOPIC
Bilirubin Urine: NEGATIVE
Glucose, UA: NEGATIVE mg/dL
Hgb urine dipstick: NEGATIVE
Ketones, ur: NEGATIVE mg/dL
Nitrite: NEGATIVE
Specific Gravity, Urine: 1.025 (ref 1.005–1.030)
pH: 5.5 (ref 5.0–8.0)

## 2013-01-22 LAB — CBC
Platelets: 123 10*3/uL — ABNORMAL LOW (ref 150–400)
RBC: 2.96 MIL/uL — ABNORMAL LOW (ref 3.87–5.11)
WBC: 17.7 10*3/uL — ABNORMAL HIGH (ref 4.0–10.5)

## 2013-01-22 LAB — URINE MICROSCOPIC-ADD ON

## 2013-01-22 NOTE — Progress Notes (Signed)
CSW sent clinicals to [employer] for snf auth at [facility].  Kendi Defalco C. Vinessa Macconnell MSW, LCSW [phone]

## 2013-01-22 NOTE — Progress Notes (Signed)
Occupational Therapy Treatment Patient Details Name: [name] MRN: [id number] DOB: 20-Sep-1923 Today's Date: 01/22/2013 Time: 4098-1191 OT Time Calculation (min): 18 min  OT Assessment / Plan / Recommendation  History of present illness Pt was admitted after fall, hitting her head.  At baseline, she lives alone with [facility] Aides.  CT and xrays of multiple bones were negative, however, L2 compression fx has changed since 2009.     OT comments  Pt with 5/10 pain at start of session and progressed to 8/10 with increased time in unsupported sitting position for ADL. Nursing in room and aware.   Follow Up Recommendations  SNF;Supervision/Assistance - 24 hour    Barriers to Discharge       Equipment Recommendations  None recommended by OT    Recommendations for Other Services    Frequency Min 2X/week   Progress towards OT Goals Progress towards OT goals: Progressing toward goals  Plan      Precautions / Restrictions Precautions Precautions: Fall Restrictions Weight Bearing Restrictions: No        ADL  Toilet Transfer: Minimal assistance (sit to stand aspect only) Toilet Transfer Method: Sit to stand ADL Comments: Sat at [facility] similar to EOB goal to perform grooming task in unsupported position. Did tolerate 5 minutes but with increased pain as time increased in lower L back. Nursing in room to give pain meds. 5/10 at rest but up to 8 by end of grooming task. Did stand briefly to reposition in chair with min assist.     OT Diagnosis:    OT Problem List:   OT Treatment Interventions:     OT Goals(current goals can now be found in the care plan section)    Visit Information  Last OT Received On: 01/22/13 Assistance Needed: +1 History of Present Illness: Pt was admitted after fall, hitting her head.  At baseline, she lives alone with [facility] Aides.  CT and xrays of multiple bones were negative, however, L2 compression fx has changed since 2009.      Subjective Data      Prior  Functioning       Cognition  Cognition Arousal/Alertness: Awake/alert Behavior During Therapy: WFL for tasks assessed/performed Overall Cognitive Status: Within Functional Limits for tasks assessed    Mobility  Transfers Transfers: Sit to Stand;Stand to Sit Sit to Stand: 4: Min assist;With upper extremity assist;From chair/3-in-1 Stand to Sit: 4: Min assist;With upper extremity assist;To chair/3-in-1 Details for Transfer Assistance: pt stood to reposition in chair with min assist. pt able to push up from armrests of chair without verbal cues needed.     Exercises      Balance     End of Session OT - End of Session Activity Tolerance: Patient limited by pain Patient left: in chair;with call bell/phone within reach  GO     Lennox Laity [phone] 01/22/2013, 11:45 AM

## 2013-01-22 NOTE — Progress Notes (Signed)
Physical Therapy Treatment Patient Details Name: [name] MRN: [id number] DOB: 18-Dec-1923 Today's Date: 01/22/2013 Time: 0865-7846 PT Time Calculation (min): 27 min  PT Assessment / Plan / Recommendation  History of Present Illness Pt was admitted after fall, hitting her head.  At baseline, she lives alone with [facility].  CT and xrays of multiple bones were negative, however, L2 compression fx has changed since 2009.     PT Comments   Pt reports she "just doesn't feel right".  Took orthostatic BP's Supine: 123/62 Standing: 92/68 Assisted pt OOB to amb to BR then in hallway.  Very unsteady gait with poor self corrective response to any LOB.     Follow Up Recommendations  Home health PT;SNF (pending progress and family support)     Does the patient have the potential to tolerate intense rehabilitation     Barriers to Discharge        Equipment Recommendations  None recommended by PT    Recommendations for Other Services    Frequency Min 3X/week   Progress towards PT Goals Progress towards PT goals: Progressing toward goals  Plan      Precautions / Restrictions Precautions Precautions: Fall Restrictions Weight Bearing Restrictions: No    Pertinent Vitals/Pain C/o L hip pain sitting worse that standing    Mobility  Bed Mobility Bed Mobility: Supine to Sit Supine to Sit: 4: Min assist Details for Bed Mobility Assistance: increased, increased time woth HOB elevated 45' and repeat cueing to complete task at hand as pt repeat "Now what am I doing". Transfers Transfers: Sit to Stand;Stand to Sit Sit to Stand: 4: Min assist;With upper extremity assist;From chair/3-in-1 Stand to Sit: 4: Min assist;With upper extremity assist;To chair/3-in-1 Details for Transfer Assistance: 25% VC's on proper tech and hand placement esp with stand to sit Ambulation/Gait Ambulation/Gait Assistance: 3: Mod assist;2: Max assist Ambulation Distance (Feet): 25 Feet Assistive device: Rolling  walker Ambulation/Gait Assistance Details: Unsteady gait with poor balance and poor self correction to posterior lean.  Pt evenly WBing thru B LE with c/o L hip pain 8/10.  C/o 10/10 pain with sitting (toilet).  Gait Pattern: Step-to pattern;Narrow base of support Gait velocity: decreased     PT Goals (current goals can now be found in the care plan section)    Visit Information  Last PT Received On: 01/22/13 Assistance Needed: +1 History of Present Illness: Pt was admitted after fall, hitting her head.  At baseline, she lives alone with [facility].  CT and xrays of multiple bones were negative, however, L2 compression fx has changed since 2009.      Subjective Data      Cognition  Cognition Arousal/Alertness: Awake/alert Behavior During Therapy: WFL for tasks assessed/performed Overall Cognitive Status: Within Functional Limits for tasks assessed    Balance     End of Session PT - End of Session Equipment Utilized During Treatment: Gait belt Patient left: in chair;with call bell/phone within reach   Felecia Shelling  PTA [facility]  Acute  Rehab Pager      [phone]

## 2013-01-22 NOTE — Progress Notes (Signed)
VASCULAR LAB PRELIMINARY  PRELIMINARY  PRELIMINARY  PRELIMINARY  Carotid duplex  completed.    Preliminary report:  Right:  40-59% internal carotid artery stenosis.   Left:  1-39% ICA stenosis.  Bilateral:  Vertebral artery flow is antegrade.     Rickeya Manus, RVT 01/22/2013, 9:32 AM

## 2013-01-22 NOTE — Clinical Social Work Psychosocial (Signed)
     Clinical Social Work Department BRIEF PSYCHOSOCIAL ASSESSMENT 01/22/2013  Patient:  [name], [name]     Account Number:  [id number]     Admit date:  01/19/2013  Clinical Social Worker:  Hattie Perch  Date/Time:  01/22/2013 12:00 M  Referred by:  RN  Date Referred:  01/22/2013 Referred for  SNF Placement   Other Referral:   Interview type:  Patient Other interview type:   daughter via telephone    PSYCHOSOCIAL DATA Living Status:  ALONE Admitted from facility:   Level of care:   Primary support name:  Pat Primary support relationship to patient:  CHILD, ADULT Degree of support available:   good    CURRENT CONCERNS Current Concerns  Post-Acute Placement   Other Concerns:    SOCIAL WORK ASSESSMENT / PLAN CSW met with patient. patient is alert and oriented X3. patient in need of snf placement. patient reports that she has been to snf before, at [facility]. she requests that CSW call her daughter, Dennie Bible, as Dennie Bible handles this kind of thing for her. CSW called Pat. Dennie Bible stated [facility] was far away but had good rehab. she is agreeable to [facility] as first choice or [facility] for second choice. she prefers a private room. CSW informed her that we can not guarantee a private room but that CSW will ask. Dennie Bible stated "I know you cant guarantee it! but that's what I want!"   Assessment/plan status:   Other assessment/ plan:   Information/referral to community resources:    PATIENTS/FAMILYS RESPONSE TO PLAN OF CARE: Daughter is agreeable to rehab and wants patient at [facility] but is upset that [facility] is so far away. patient is pleasant and cooperative and realistic about need for snf placement.

## 2013-01-22 NOTE — Progress Notes (Signed)
[facility] PROGRESS NOTE  [name] NWG:[id number] DOB: Oct 06, 1923 DOA: 01/19/2013 PCP: Danise Edge, MD  HPI/Brief narrative 79 y.o. female with hx of MDS, PVD, DM, CAD, GERD, IPF, hypothyroidism, HTN, hyperlipidemia, AVB s/p PPM (no MRI), lives alone with home health aids, fell on day of admission doing laundry, hit her head, presents to the ER with back pain, and bilateral hip pain left greater than right. She has had left hip fracture in 2/14 s/p ORIF. Work up in the ER showed WBC of 20K, CT head, cervical spine was negative. X ray of lumbar, hips, femurs, right forearm, showed no acute Fx, except compression Fx of 20 percent L2 new since 2009. Her Cr is 1.5. Attempting to ambulate her, she has severe pain in both hips, with left greater than right. Hospitalist was asked to admit her for further work up and disposition, since she lives alone and cannot ambulate due to pain. CT of left hip negative for fractures. Patient denies syncopal episodes but states has had multiple falls-5? Over unknown period of time. Son however & patient subsequently states that she may have passed out.   Assessment/Plan:  Bilateral hips pain (L>R), recurrent falls/Syncope - No fractures on imaging. - Falls may be multifactorial secondary to orthostatic hypotension,syncope, gait instability, arthritis. - We'll check urine microscopy to rule out UTI- not sent yet- reminded nursing. - Hip pain seems better and ambulated with PT today.. - Patient underwent extensive cardiac workup in early August: Cardiac enzymes negative, CT chest negative for PE, stress Myoview negative for ischemic changes. EKG: Paced rhythm. - Will request carotid Dopplers.  Orthostatic hypotension - Clinically euvolemic. - Not on medications which could contribute to this. - ? Related to long-standing diabetes. - Bilateral lower extremity compression stockings.  Type II DM - Reasonable inpatient control. - Continue SSI -  Hemoglobin A1c on 12/03/12:6.5  Hypothyroidism - Continue Synthroid - TSH on 12/03/12:0.405  Stage III chronic kidney disease - Creatinine slightly higher than baseline. Baseline 1.3. - Creatinine improved: 1.09  Chronic anemia/leukocytosis/MDS/Intermittent Thrombocytopenia - Stable. - Outpatient followup with oncology. - Stable  CAD/PPM - Asymptomatic of chest pain.  Hypertension - Mildly uncontrolled  L2 compression fracture - Not complaining of pain in that region.  DVT prophylaxis: Heparin Lines/catheters: PIV Nutrition: Diabetic diet  Activity:  Up with assistance Code Status: Full Family Communication: None today. Disposition Plan: SNF when bed available.   Consultants:  None  Procedures:  None  Antibiotics:  None   Subjective: Pains are better. Able to ambulate very short distance with PT-unsteady gait.  Objective: Filed Vitals:   01/22/13 0930 01/22/13 0937 01/22/13 0944 01/22/13 0948  BP: 123/62 97/56 92/68  107/65  Pulse: 84 90 94 92  Temp:      TempSrc:      Resp:      Height:      Weight:      SpO2:    99%   No intake or output data in the 24 hours ending 01/22/13 1118 Filed Weights   01/19/13 1918 01/20/13 0023  Weight: 43.092 kg (95 lb) 43.092 kg (95 lb)     Exam:  General exam: Moderately built and thinly nourished frail elderly female sitting comfortably on a reclining chair and is in no obvious distress. Looks better than she did 2 days ago. Respiratory system: clear to auscultation. No increased work of breathing. Cardiovascular system: S1 & S2 heard, RRR. No JVD, murmurs, gallops, clicks or pedal edema. Gastrointestinal system: Abdomen is nondistended,  soft and nontender. Normal bowel sounds heard. Central nervous system: Alert and oriented x2. No focal neurological deficits. Extremities: Symmetric 5 x 5 power.   Data Reviewed: Basic Metabolic Panel:  Recent Labs Lab 01/19/13 1949 01/20/13 0556 01/21/13 0741  NA 135   --  134*  K 4.1  --  3.8  CL 96  --  94*  CO2 30  --  31  GLUCOSE 162*  --  170*  BUN 25*  --  17  CREATININE 1.52* 1.22* 1.09  CALCIUM 9.4  --  9.4   Liver Function Tests: No results found for this basename: AST, ALT, ALKPHOS, BILITOT, PROT, ALBUMIN,  in the last 168 hours No results found for this basename: LIPASE, AMYLASE,  in the last 168 hours No results found for this basename: AMMONIA,  in the last 168 hours CBC:  Recent Labs Lab 01/19/13 1949 01/20/13 0556 01/21/13 0741 01/22/13 0440  WBC 20.0* 16.1* 17.4* 17.7*  NEUTROABS 17.7*  --   --   --   HGB 9.7* 9.7* 9.3* 9.0*  HCT 30.3* 30.5* 29.3* 28.8*  MCV 97.1 97.8 97.0 97.3  PLT 173 141* 126* 123*   Cardiac Enzymes:  Recent Labs Lab 01/19/13 1919  CKTOTAL 97  CKMB 2.7   BNP (last 3 results)  Recent Labs  04/20/12 1226 12/03/12 0825  PROBNP 113.0* 2120.0*   CBG:  Recent Labs Lab 01/21/13 0745 01/21/13 1105 01/21/13 1604 01/21/13 2120 01/22/13 0753  GLUCAP 159* 167* 221* 194* 170*    No results found for this or any previous visit (from the past 240 hour(s)).    Additional labs: 1. None     Studies: No results found.      Scheduled Meds: . aspirin EC  81 mg Oral Daily  . atorvastatin  40 mg Oral q1800  . bacitracin-polymyxin b   Left Eye Q12H  . cyanocobalamin  500 mcg Oral Daily  . darifenacin  7.5 mg Oral Daily  . docusate sodium  100 mg Oral BID  . heparin  5,000 Units Subcutaneous Q8H  . insulin aspart  0-9 Units Subcutaneous TID WC  . lactose free nutrition  237 mL Oral TID BM  . levothyroxine  112 mcg Oral QAC breakfast  . pantoprazole  40 mg Oral Daily  . PARoxetine  20 mg Oral Q24H   Continuous Infusions:   Active Problems:   HYPOTHYROIDISM   Atrioventricular block, complete   HYPOTENSION, ORTHOSTATIC   RENAL FAILURE, CHRONIC   Myelodysplastic syndrome   Diabetes mellitus   Pacemaker-[employer]   Left hip pain   Compression fracture   Malnutrition of moderate  degree    Time spent: 25 minutes.    [facility]  [facility] Pager [phone].   If 8PM-8AM, please contact night-coverage at [email or web address], password [facility] 01/22/2013, 11:18 AM  LOS: 3 days

## 2013-01-23 LAB — GLUCOSE, CAPILLARY
Glucose-Capillary: 205 mg/dL — ABNORMAL HIGH (ref 70–99)
Glucose-Capillary: 183 mg/dL — ABNORMAL HIGH (ref 70–99)
Glucose-Capillary: 263 mg/dL — ABNORMAL HIGH (ref 70–99)

## 2013-01-23 LAB — URINE CULTURE

## 2013-01-23 MED ORDER — HYDROMORPHONE HCL 2 MG PO TABS: 2.0000 mg | ORAL_TABLET | Freq: Four times a day (QID) | ORAL | Status: DC | PRN

## 2013-01-23 MED ORDER — FOSFOMYCIN TROMETHAMINE 3 G PO PACK: 3.0000 g | PACK | ORAL | Status: DC

## 2013-01-23 MED ORDER — FOSFOMYCIN TROMETHAMINE 3 G PO PACK
3.0000 g | PACK | ORAL | Status: DC
  Administered 2013-01-23: 3 g via ORAL
  Filled 2013-01-23: qty 3

## 2013-01-23 NOTE — Progress Notes (Signed)
Received auth from [employer] #[id number] for ambulance transport. Family to sign paperwork today at 1030 for [facility].  [name] MSW, LCSW [phone]

## 2013-01-23 NOTE — Discharge Summary (Signed)
Physician Discharge Summary  [name] ZOX:[id number] DOB: 1923/09/20 DOA: 01/19/2013  PCP: Danise Edge, MD/  Admit date: 01/19/2013 Discharge date: 01/23/2013  Time spent: Greater than 30 minutes  Recommendations for Outpatient Follow-up:  1. Dr. Reuel Derby, PCP/Melissa Peggyann Juba, NP: arrange for follow up when [facility] from SNF. 2. Keep up follow up appointments at the [facility] for followup of MDS. CBCs can be followed during the visit.  3. Followup final urine culture results that were sent from the hospital on 01/22/13 4. Bilateral lower extremity thigh high compression stockings. 5. Consider outpatient orthopedic consultation if continues to have persisting left hip pain. 6. Consider outpatient ophthalmology consultation-patient has been on antibiotic eye ointment for a while.  Discharge Diagnoses:  Active Problems:   HYPOTHYROIDISM   Atrioventricular block, complete   HYPOTENSION, ORTHOSTATIC   RENAL FAILURE, CHRONIC   Myelodysplastic syndrome   Diabetes mellitus   Pacemaker-[employer]   Left hip pain   Compression fracture   Malnutrition of moderate degree   Discharge Condition: Improved & Stable  Diet recommendation: Heart healthy and diabetic diet.  Filed Weights   01/19/13 1918 01/20/13 0023  Weight: 43.092 kg (95 lb) 43.092 kg (95 lb)    History of present illness:  79 y.o. female with hx of MDS, PVD, DM, CAD, GERD, IPF, hypothyroidism, HTN, hyperlipidemia, AVB s/p PPM (no MRI), lives alone with home health aids, fell on day of admission doing laundry, hit her head, presents to the ER with back pain, and bilateral hip pain left greater than right. She has had left hip fracture in 2/14 s/p ORIF. Work up in the ER showed WBC of 20K, CT head, cervical spine was negative. X ray of lumbar, hips, femurs, right forearm, showed no acute Fx, except compression Fx of 20 percent L2 new since 2009. Her Cr is 1.5. Attempting to ambulate her, she has severe pain in  both hips, with left greater than right. Hospitalist was asked to admit her for further work up and disposition, since she lives alone and cannot ambulate due to pain. CT of left hip negative for fractures. Patient denies syncopal episodes but states has had multiple falls-5? Over unknown period of time. Son however & patient subsequently states that she may have passed out.  Hospital Course:   Bilateral hips pain (L>R)/recurrent falls/Syncope  - No fractures on imaging.  - Falls may be multifactorial secondary to orthostatic hypotension,syncope, gait instability, arthritis.  - Hip pain seems better and ambulated with PT on 01/22/13.  - Patient underwent extensive cardiac workup in early August: Cardiac enzymes negative, CT chest negative for PE, stress Myoview negative for ischemic changes. EKG: Paced rhythm.  - Carotid Dopplers: Right: 40-59% ICA stenosis. Left: 1-39% ICA stenosis. - Consider outpatient orthopedic consultation if left hip pain does not progressively improved.  Orthostatic hypotension  - Clinically euvolemic.  - Not on medications which could contribute to this.  - ? Related to long-standing diabetes.  - Bilateral lower extremity compression stockings.   Possible recurrent UTI versus colonization - Patient denies complaints. She was afebrile. Unable to use leukocytosis as diagnostic marker secondary to MDS and chronic leukocytosis. - Urine apparently looked green. Urine culture is pending. - Based on allergies and prior urine culture results, we'll treat with 3 doses of oral fosfomycin.  Type II DM  - Reasonable inpatient control.  - Diet control as outpatient. - Hemoglobin A1c on 12/03/12:6.5   Hypothyroidism  - Continue Synthroid  - TSH on 12/03/12:0.405  Stage III chronic kidney disease  - Creatinine slightly higher than baseline. Baseline 1.3.  - Creatinine improved: 1.09   Chronic anemia/leukocytosis/MDS/Intermittent Thrombocytopenia  - Stable.  - Outpatient  followup with oncology.   CAD/PPM  - Asymptomatic of chest pain.   Hypertension  - controlled   L2 compression fracture  - Not complaining of pain in that region.  Failure to thrive   Consultations:  None  Procedures:  None    Discharge Exam:  Complaints:  Left hip pain improving. Denies any other complaints.  Filed Vitals:   01/22/13 0948 01/22/13 1523 01/22/13 2049 01/23/13 0558  BP: 107/65 129/69 128/70 129/74  Pulse: 92 82 79 77  Temp:  97.9 F (36.6 C) 98.6 F (37 C) 98.2 F (36.8 C)  TempSrc:  Oral Oral Oral  Resp:  16 16 18   Height:      Weight:      SpO2: 99% 96% 95% 98%    General exam: Moderately built and thinly nourished frail elderly female lying comfortably supine in bed  Respiratory system: Poor inspiratory effort but seems clear to auscultation. No increased work of breathing.  Cardiovascular system: S1 & S2 heard, RRR. No JVD, murmurs, gallops, clicks or pedal edema.  Gastrointestinal system: Abdomen is nondistended, soft and nontender. Normal bowel sounds heard.  Central nervous system: Alert and oriented x2. No focal neurological deficits.  Extremities: Symmetric 5 x 5 power. Left hip with mild tenderness but no external bruising or any other acute findings.   Discharge Instructions      Discharge Orders   Future Appointments Provider Department Dept Phone   01/29/2013 10:30 AM [facility] [phone]   02/12/2013 11:00 AM Delcie Roch [facility] [phone]   02/12/2013 11:30 AM [facility] Nurse [place] [facility] [phone]   03/05/2013 11:00 AM [facility] [phone]   03/05/2013 11:30 AM [facility] Nurse [place] [facility] [phone]   Future Orders Complete By Expires   Call MD for:  extreme fatigue  As directed    Call MD for:  persistant dizziness  or light-headedness  As directed    Call MD for:  severe uncontrolled pain  As directed    Call MD for:  temperature >100.4  As directed    Diet - low sodium heart healthy  As directed    Diet Carb Modified  As directed    Discharge instructions  As directed    Comments:     Bilateral lower extremity thigh high compression stockings.   Increase activity slowly  As directed        Medication List    STOP taking these medications       acetaminophen 500 MG tablet  Commonly known as:  TYLENOL      TAKE these medications       aspirin EC 81 MG tablet  Take 81 mg by mouth daily.     bacitracin-polymyxin b ophthalmic ointment  Commonly known as:  POLYSPORIN  Place into the left eye every 12 (twelve) hours. apply to eye every 12 hours while awake     darifenacin 7.5 MG 24 hr tablet  Commonly known as:  ENABLEX  Take 7.5 mg by mouth daily.     fosfomycin 3 G Pack  Commonly known as:  MONUROL  Take 3 g by mouth every other day. Next dose 01/25/13.  Discontinue after 01/27/13 dose.     HYDROmorphone 2 MG tablet  Commonly known as:  DILAUDID  Take 1 tablet (2 mg total) by mouth every 6 (six) hours as needed for pain.     lactose free nutrition Liqd  Take 237 mLs by mouth 3 (three) times daily between meals.     levothyroxine 112 MCG tablet  Commonly known as:  SYNTHROID, LEVOTHROID  Take 1 tablet (112 mcg total) by mouth daily.     pantoprazole 40 MG tablet  Commonly known as:  PROTONIX  TAKE 1 TABLET BY MOUTH DAILY     PARoxetine 20 MG tablet  Commonly known as:  PAXIL  Take 1 tablet (20 mg total) by mouth every morning.     rosuvastatin 20 MG tablet  Commonly known as:  CRESTOR  Take 1 tablet (20 mg total) by mouth at bedtime.     vitamin B-12 1000 MCG tablet  Commonly known as:  CYANOCOBALAMIN  Take 1 tablet (1,000 mcg total) by mouth daily. Prefer a sublingual preparation     vitamin B-12 500 MCG tablet  Commonly known as:  CYANOCOBALAMIN  Take 500 mcg by mouth  daily.          The results of significant diagnostics from this hospitalization (including imaging, microbiology, ancillary and laboratory) are listed below for reference.    Significant Diagnostic Studies: Dg Chest 1 View  01/19/2013   *RADIOLOGY REPORT*  Clinical Data: Low back pain post fall  CHEST - 1 VIEW  Comparison: 12/03/2012  Findings: Left subclavian transvenous pacemaker leads project over right atrium and right ventricle unchanged. Minimal enlargement of cardiac silhouette. Calcified tortuous aorta. Pulmonary vascularity normal. Bronchitic changes with interval decrease in interstitial prominence since the previous exam. No segmental infiltrate, pleural effusion or pneumothorax. Minimal bibasilar atelectasis. Healing fractures of the posterior right fourth fifth and sixth ribs.  IMPRESSION: Minimal enlargement of cardiac silhouette post pacemaker. Bronchitic changes with bibasilar atelectasis versus scarring. Decreased interstitial prominence since previous exam without definite acute infiltrate.   Original Report Authenticated By: Ulyses Southward, M.D.   Dg Lumbar Spine Complete  01/19/2013   *RADIOLOGY REPORT*  Clinical Data: Low back pain, pain down left side of buttocks, fell today  LUMBAR SPINE - COMPLETE 4+ VIEW  Comparison: None Correlation:  CT abdomen pelvis 12/10/2007  Findings: Osseous demineralization. Five non-rib bearing lumbar vertebrae. Dextroconvex thoracolumbar scoliosis apex L2. Superior endplate compression deformity of L2 with 20% height loss anteriorly. Finding is new since prior abdominal CT. Remaining vertebral body heights maintained. Disc space narrowing greatest at L4-L5 with small endplate spurs. No additional fracture, subluxation or bone destruction. Facet degenerative changes lower lumbar spine. No spondylolysis. Scattered atherosclerotic calcifications.  IMPRESSION: Superior endplate compression fracture of L2 with 20% anterior height loss, new since 2009.    Original Report Authenticated By: Ulyses Southward, M.D.   Dg Forearm Right  01/19/2013   *RADIOLOGY REPORT*  Clinical Data: History of fall complaining of right forearm pain.  RIGHT FOREARM - 2 VIEW  Comparison: No priors.  Findings: AP and lateral views of the right forearm demonstrate no definite acute displaced fractures of the radius or ulna.  Bones appear mildly osteopenic.  IMPRESSION: 1.  No acute displaced fractures of the radius or ulna.   Original Report Authenticated By: Trudie Reed, M.D.   Dg Hip Complete Left  01/19/2013   *RADIOLOGY REPORT*  Clinical Data:  Larey Seat today, low back pain, pain down left side of buttocks  LEFT HIP -  COMPLETE 2+ VIEW  Comparison: Intraoperative images left hip 05/27/2012  Findings: Diffuse osseous demineralization. Orthopedic hardware proximal left femur post ORIF of an intertrochanteric fracture. Mild bilateral hip joint space narrowing. Asymmetric SI joints, likely due to rotation. No acute fracture, dislocation or bone destruction. Scattered atherosclerotic calcifications.  IMPRESSION: Osseous demineralization. Post ORIF proximal left femur. No definite acute bony abnormalities identified though the distal extent of the IM nail within the left femur is not imaged; if the patient has symptoms referable to the distal left thigh, recommend dedicated radiographs of the left femur.   Original Report Authenticated By: Ulyses Southward, M.D.   Dg Femur Left  01/19/2013   *RADIOLOGY REPORT*  Clinical Data: Left hip pain post fall  LEFT FEMUR - 2 VIEW  Comparison: Left hip radiographs 01/19/2013  Findings: Remainder of left femur is imaged. IM nail extends from the greater trochanter to the distal left femoral metaphysis. No acute fracture or dislocation. Diffuse osseous demineralization. Tricompartmental osteoarthritic changes of the left knee. Scattered atherosclerotic calcifications.  IMPRESSION: IM nail left femur. Osseous demineralization with osteoarthritic changes left knee.  No acute bony abnormalities.   Original Report Authenticated By: Ulyses Southward, M.D.   Ct Head Wo Contrast  01/19/2013   *RADIOLOGY REPORT*  Clinical Data:  History of trauma from a fall.  Headache.  CT HEAD WITHOUT CONTRAST CT CERVICAL SPINE WITHOUT CONTRAST  Technique:  Multidetector CT imaging of the head and cervical spine was performed following the standard protocol without intravenous contrast.  Multiplanar CT image reconstructions of the cervical spine were also generated.  Comparison:  Head CT and cervical spine CT 02/23/2011.  CT HEAD  Findings: Mild cerebral and cerebellar atrophy.  Extensive patchy extensive patchy and confluent areas of decreased attenuation throughout the deep and periventricular white matter of the cerebral hemispheres bilaterally, compatible with chronic microvascular ischemic disease.  Physiologic calcifications of the basal ganglia bilaterally (right greater than left). No acute displaced skull fractures are identified.  No acute intracranial abnormality.  Specifically, no evidence of acute post-traumatic intracranial hemorrhage, no definite regions of acute/subacute cerebral ischemia, no focal mass, mass effect, hydrocephalus or abnormal intra or extra-axial fluid collections.  The visualized paranasal sinuses and mastoids are well pneumatized.  IMPRESSION: 1.  No acute displaced skull fractures or findings to suggest significant acute traumatic injury to the brain. 2.  Mild cerebral and cerebellar atrophy with extensive chronic microvascular ischemic changes in the cerebral white matter redemonstrated, as above.  CT CERVICAL SPINE  Findings: No acute displaced fractures of the cervical spine. Alignment is anatomic.  Prevertebral soft tissues are normal. Severe multilevel degenerative disc disease, most pronounced at C4- C5, C5-C6 and C6-C7.  Mild multilevel facet arthropathy is also noted.  Visualized portions of the upper thorax are unremarkable.  IMPRESSION: 1.  No evidence to  suggest significant acute traumatic injury to the cervical spine. 2.  Multilevel degenerative disc disease and cervical spondylosis, as above.   Original Report Authenticated By: Trudie Reed, M.D.   Ct Cervical Spine Wo Contrast  01/19/2013   *RADIOLOGY REPORT*  Clinical Data:  History of trauma from a fall.  Headache.  CT HEAD WITHOUT CONTRAST CT CERVICAL SPINE WITHOUT CONTRAST  Technique:  Multidetector CT imaging of the head and cervical spine was performed following the standard protocol without intravenous contrast.  Multiplanar CT image reconstructions of the cervical spine were also generated.  Comparison:  Head CT and cervical spine CT 02/23/2011.  CT HEAD  Findings: Mild cerebral and cerebellar  atrophy.  Extensive patchy extensive patchy and confluent areas of decreased attenuation throughout the deep and periventricular white matter of the cerebral hemispheres bilaterally, compatible with chronic microvascular ischemic disease.  Physiologic calcifications of the basal ganglia bilaterally (right greater than left). No acute displaced skull fractures are identified.  No acute intracranial abnormality.  Specifically, no evidence of acute post-traumatic intracranial hemorrhage, no definite regions of acute/subacute cerebral ischemia, no focal mass, mass effect, hydrocephalus or abnormal intra or extra-axial fluid collections.  The visualized paranasal sinuses and mastoids are well pneumatized.  IMPRESSION: 1.  No acute displaced skull fractures or findings to suggest significant acute traumatic injury to the brain. 2.  Mild cerebral and cerebellar atrophy with extensive chronic microvascular ischemic changes in the cerebral white matter redemonstrated, as above.  CT CERVICAL SPINE  Findings: No acute displaced fractures of the cervical spine. Alignment is anatomic.  Prevertebral soft tissues are normal. Severe multilevel degenerative disc disease, most pronounced at C4- C5, C5-C6 and C6-C7.  Mild  multilevel facet arthropathy is also noted.  Visualized portions of the upper thorax are unremarkable.  IMPRESSION: 1.  No evidence to suggest significant acute traumatic injury to the cervical spine. 2.  Multilevel degenerative disc disease and cervical spondylosis, as above.   Original Report Authenticated By: Trudie Reed, M.D.   Ct Hip Left Wo Contrast  01/19/2013   *RADIOLOGY REPORT*  Clinical Data: Left hip pain post fall, negative radiographs, unable to bear weight  CT OF THE LEFT HIP WITHOUT CONTRAST  Technique:  Multidetector CT imaging was performed according to the standard protocol. Multiplanar CT image reconstructions were also generated.  Comparison: Radiographs 01/19/2013  Findings: IM nail with blade extending into the left femoral head post ORIF of an intertrochanteric fracture. Diffuse osseous demineralization. Visualized pelvis appears intact. Healed intertrochanteric fracture identified. No acute fracture or dislocation. Extensive atherosclerotic calcification. Soft tissue planes unremarkable.  IMPRESSION: Osseous demineralization. Post nailing of an intertrochanteric fracture of the left femur, which appears healed. No acute bony abnormalities identified.   Original Report Authenticated By: Ulyses Southward, M.D.    Microbiology: No results found for this or any previous visit (from the past 240 hour(s)).   Labs: Basic Metabolic Panel:  Recent Labs Lab 01/19/13 1949 01/20/13 0556 01/21/13 0741  NA 135  --  134*  K 4.1  --  3.8  CL 96  --  94*  CO2 30  --  31  GLUCOSE 162*  --  170*  BUN 25*  --  17  CREATININE 1.52* 1.22* 1.09  CALCIUM 9.4  --  9.4   Liver Function Tests: No results found for this basename: AST, ALT, ALKPHOS, BILITOT, PROT, ALBUMIN,  in the last 168 hours No results found for this basename: LIPASE, AMYLASE,  in the last 168 hours No results found for this basename: AMMONIA,  in the last 168 hours CBC:  Recent Labs Lab 01/19/13 1949 01/20/13 0556  01/21/13 0741 01/22/13 0440  WBC 20.0* 16.1* 17.4* 17.7*  NEUTROABS 17.7*  --   --   --   HGB 9.7* 9.7* 9.3* 9.0*  HCT 30.3* 30.5* 29.3* 28.8*  MCV 97.1 97.8 97.0 97.3  PLT 173 141* 126* 123*   Cardiac Enzymes:  Recent Labs Lab 01/19/13 1919  CKTOTAL 97  CKMB 2.7   BNP: BNP (last 3 results)  Recent Labs  04/20/12 1226 12/03/12 0825  PROBNP 113.0* 2120.0*   CBG:  Recent Labs Lab 01/22/13 0753 01/22/13 1133 01/22/13 1649 01/22/13 2112 01/23/13  0746  GLUCAP 170* 206* 181* 205* 183*    Additional labs: 1. None   Signed:  Jovan Schickling  [facility] 01/23/2013, 11:19 AM

## 2013-01-23 NOTE — Clinical Social Work Placement (Signed)
     Clinical Social Work Department CLINICAL SOCIAL WORK PLACEMENT NOTE 01/23/2013  Patient:  [name], [name]  Account Number:  [id number] Admit date:  01/19/2013  Clinical Social Worker:  Becky Sax, LCSW  Date/time:  01/22/2013 12:00 M  Clinical Social Work is seeking post-discharge placement for this patient at the following level of care:   SKILLED NURSING   (*CSW will update this form in Epic as items are completed)   01/22/2013  Patient/family provided with [facility] Department of Clinical Social Works list of facilities offering this level of care within the geographic area requested by the patient (or if unable, by the patients family).  01/22/2013  Patient/family informed of their freedom to choose among providers that offer the needed level of care, that participate in Medicare, Medicaid or managed care program needed by the patient, have an available bed and are willing to accept the patient.  01/22/2013  Patient/family informed of [facility] ownership interest in [facility], as well as of the fact that they are under no obligation to receive care at this facility.  PASARR submitted to EDS on 01/23/2013 PASARR number received from EDS on 01/23/2013  FL2 transmitted to all facilities in geographic area requested by pt/family on  01/22/2013 FL2 transmitted to all facilities within larger geographic area on 01/22/2013  Patient informed that his/her managed care company has contracts with or will negotiate with  certain facilities, including the following:     Patient/family informed of bed offers received:  01/22/2013 Patient chooses bed at [facility] Physician recommends and patient chooses bed at    Patient to be transferred to [facility] on  01/23/2013 Patient to be transferred to facility by ptar  The following physician request were entered in Epic:   Additional Comments:

## 2013-01-23 NOTE — Progress Notes (Signed)
Patient cleared for discharge. Packet  Copied and placed in [place]. CSW met with patient. Patient very glad that there is a bed available at [facility] and is hopeful that her therapy will help her to return home. ptar called for transportation. Patient aware that there is no guarantee of payment. Voicemail left for patients daughter informing of discharge.  Lois Ostrom C. Luann Aspinwall MSW, LCSW [phone]

## 2013-01-24 ENCOUNTER — Non-Acute Institutional Stay (SKILLED_NURSING_FACILITY): Payer: Medicare Other | Admitting: Adult Health

## 2013-01-24 DIAGNOSIS — D469 Myelodysplastic syndrome, unspecified: Secondary | ICD-10-CM

## 2013-01-24 DIAGNOSIS — I2510 Atherosclerotic heart disease of native coronary artery without angina pectoris: Secondary | ICD-10-CM

## 2013-01-24 DIAGNOSIS — N318 Other neuromuscular dysfunction of bladder: Secondary | ICD-10-CM

## 2013-01-24 DIAGNOSIS — I10 Essential (primary) hypertension: Secondary | ICD-10-CM

## 2013-01-24 DIAGNOSIS — F329 Major depressive disorder, single episode, unspecified: Secondary | ICD-10-CM

## 2013-01-24 DIAGNOSIS — N3281 Overactive bladder: Secondary | ICD-10-CM

## 2013-01-24 DIAGNOSIS — M25559 Pain in unspecified hip: Secondary | ICD-10-CM

## 2013-01-24 DIAGNOSIS — E119 Type 2 diabetes mellitus without complications: Secondary | ICD-10-CM

## 2013-01-24 DIAGNOSIS — E785 Hyperlipidemia, unspecified: Secondary | ICD-10-CM

## 2013-01-24 DIAGNOSIS — IMO0002 Reserved for concepts with insufficient information to code with codable children: Secondary | ICD-10-CM

## 2013-01-24 DIAGNOSIS — W19XXXA Unspecified fall, initial encounter: Secondary | ICD-10-CM

## 2013-01-24 DIAGNOSIS — E039 Hypothyroidism, unspecified: Secondary | ICD-10-CM

## 2013-01-24 DIAGNOSIS — T148XXA Other injury of unspecified body region, initial encounter: Secondary | ICD-10-CM

## 2013-01-24 DIAGNOSIS — W19XXXD Unspecified fall, subsequent encounter: Secondary | ICD-10-CM

## 2013-01-25 ENCOUNTER — Non-Acute Institutional Stay (SKILLED_NURSING_FACILITY): Payer: Medicare Other | Admitting: Internal Medicine

## 2013-01-25 DIAGNOSIS — E1129 Type 2 diabetes mellitus with other diabetic kidney complication: Secondary | ICD-10-CM

## 2013-01-25 DIAGNOSIS — E039 Hypothyroidism, unspecified: Secondary | ICD-10-CM

## 2013-01-25 DIAGNOSIS — D469 Myelodysplastic syndrome, unspecified: Secondary | ICD-10-CM

## 2013-01-29 ENCOUNTER — Ambulatory Visit: Payer: Medicare Other

## 2013-01-30 ENCOUNTER — Non-Acute Institutional Stay (SKILLED_NURSING_FACILITY): Payer: Medicare Other | Admitting: Adult Health

## 2013-01-30 ENCOUNTER — Telehealth: Payer: Self-pay | Admitting: Dietician

## 2013-01-30 DIAGNOSIS — N183 Chronic kidney disease, stage 3 unspecified: Secondary | ICD-10-CM

## 2013-01-30 DIAGNOSIS — T148XXA Other injury of unspecified body region, initial encounter: Secondary | ICD-10-CM

## 2013-01-30 DIAGNOSIS — IMO0002 Reserved for concepts with insufficient information to code with codable children: Secondary | ICD-10-CM

## 2013-01-30 DIAGNOSIS — N318 Other neuromuscular dysfunction of bladder: Secondary | ICD-10-CM

## 2013-01-30 DIAGNOSIS — M25559 Pain in unspecified hip: Secondary | ICD-10-CM

## 2013-01-30 DIAGNOSIS — E785 Hyperlipidemia, unspecified: Secondary | ICD-10-CM

## 2013-01-30 DIAGNOSIS — M25551 Pain in right hip: Secondary | ICD-10-CM

## 2013-01-30 DIAGNOSIS — I10 Essential (primary) hypertension: Secondary | ICD-10-CM

## 2013-01-30 DIAGNOSIS — F3289 Other specified depressive episodes: Secondary | ICD-10-CM

## 2013-01-30 DIAGNOSIS — F329 Major depressive disorder, single episode, unspecified: Secondary | ICD-10-CM

## 2013-01-30 DIAGNOSIS — N3281 Overactive bladder: Secondary | ICD-10-CM

## 2013-01-30 DIAGNOSIS — E039 Hypothyroidism, unspecified: Secondary | ICD-10-CM

## 2013-01-30 DIAGNOSIS — D469 Myelodysplastic syndrome, unspecified: Secondary | ICD-10-CM

## 2013-01-30 DIAGNOSIS — E119 Type 2 diabetes mellitus without complications: Secondary | ICD-10-CM

## 2013-01-30 DIAGNOSIS — I2510 Atherosclerotic heart disease of native coronary artery without angina pectoris: Secondary | ICD-10-CM

## 2013-02-01 DIAGNOSIS — M25559 Pain in unspecified hip: Secondary | ICD-10-CM | POA: Insufficient documentation

## 2013-02-01 DIAGNOSIS — F329 Major depressive disorder, single episode, unspecified: Secondary | ICD-10-CM | POA: Insufficient documentation

## 2013-02-01 DIAGNOSIS — N3281 Overactive bladder: Secondary | ICD-10-CM | POA: Insufficient documentation

## 2013-02-01 NOTE — Progress Notes (Signed)
Patient ID: [name], female   DOB: 1924-03-27, 79 y.o.   MRN: [id number]       PROGRESS NOTE  DATE: 01/24/2013  FACILITY: Nursing Home Location: [facility]  LEVEL OF CARE: SNF (31)  CHIEF COMPLAINT: Follow-up hospitalization  HISTORY OF PRESENT ILLNESS: This is an 79 year old female who has been admitted to [facility] on 01/23/13 from [facility]. She had a fall at home sustaining L2 compression fracture and bilateral hip pain. She has been admitted for a short term rehabilitation the dictation.  REASSESSMENT OF ONGOING PROBLEM(S):  DM:pt's DM remains stable.  Pt denies polyuria, polydipsia, polyphagia, changes in vision or hypoglycemic episodes.  No  medication presently being used.  7/14 hemoglobin A1c 6.5  CAD: The angina has been stable. The patient denies dyspnea on exertion, orthopnea, pedal edema, palpitations and paroxysmal nocturnal dyspnea. No complications noted from the medication presently being used.  DEPRESSION: The depression remains stable. Patient denies ongoing feelings of sadness, insomnia, anedhonia or lack of appetite. No complications reported from the medications currently being used. Staff do not report behavioral problems.  PAST MEDICAL HISTORY : Reviewed.  No changes.  CURRENT MEDICATIONS: Reviewed per [facility]  REVIEW OF SYSTEMS:  GENERAL: no change in appetite, no fatigue, no weight changes, no fever, chills or weakness RESPIRATORY: no cough, SOB, DOE, wheezing, hemoptysis CARDIAC: no chest pain, edema or palpitations GI: no abdominal pain, diarrhea, constipation, heart burn, nausea or vomiting  PHYSICAL EXAMINATION  VS:  T 97.8       P 84      RR 20     BP 128/74    POX %     WT 94.4 (Lb)  GENERAL: no acute distress, thin body habitus EYES: conjunctivae normal, sclerae normal, normal eye lids NECK: supple, trachea midline, no neck masses, no thyroid tenderness, no thyromegaly LYMPHATICS: no LAN in the neck, no  supraclavicular LAN RESPIRATORY: breathing is even & unlabored, BS CTAB CARDIAC: RRR, no murmur,no extra heart sounds, no edema GI: abdomen soft, normal BS, no masses, no tenderness, no hepatomegaly, no splenomegaly PSYCHIATRIC: the patient is alert & oriented to person, affect & behavior appropriate  LABS/RADIOLOGY:  01/20/13 sodium 134 potassium 3.8 glucose 117 BUN 17 creatinine 1.22 calcium 9.4 WBC 16.1 hemoglobin 9.7 hematocrit 30.5  ASSESSMENT/PLAN:  Status post fall - for PT and OT  Lumbar compression fracture (L2) - no complaints of pain in the area  Diabetes mellitus type 2 - diet controlled  hypothyroidism  - stable    chronic kidney disease stage III - stable  Myelodysplastic syndrome - followed up by outpatient oncology  CAD - stable  Hypertension - well controlled  Depression - stable  Overactive bladder - continue Enablex  Hyperlipidemia - continue Crestor    CPT CODE: 40981

## 2013-02-01 NOTE — Progress Notes (Signed)
Patient ID: [name], female   DOB: 1923-11-10, 79 y.o.   MRN: [id number]       PROGRESS NOTE  DATE: 01/30/2013   FACILITY: [facility]  LEVEL OF CARE: SNF (31)    CHIEF COMPLAINT:  Discharge Visit  HISTORY OF PRESENT ILLNESS:This is an 79 year old female who is for discharge home with home health PT, OT, nursing and CNA. She has been admitted to [facility] place on 01/23/13 from [facility] status post fall at home sustaining room bar to compression fracture and bilateral hip pain. Patient was admitted to this facility for short-term rehabilitation. Patient has completed SNF rehabilitation and therapy has cleared the patient for discharge.  Reassessment of ongoing problem(s):  HTN: Pt 's HTN remains stable.  Denies CP, sob, DOE, pedal edema, headaches, dizziness or visual disturbances.  No complications from the medications currently being used.  Last BP : 124/73  HYPOTHYROIDISM: The hypothyroidism remains stable. No complications noted from the medications presently being used.  The patient denies fatigue or constipation.    CAD: The angina has been stable. The patient denies dyspnea on exertion, orthopnea, pedal edema, palpitations and paroxysmal nocturnal dyspnea. No complications noted from the medication presently being used.  PAST MEDICAL HISTORY : Reviewed.  No changes.  CURRENT MEDICATIONS: Reviewed per [facility]  REVIEW OF SYSTEMS:  GENERAL: no change in appetite, no fatigue, no weight changes, no fever, chills or weakness RESPIRATORY: no cough, SOB, DOE, wheezing, hemoptysis CARDIAC: no chest pain, edema or palpitations GI: no abdominal pain, diarrhea, constipation, heart burn, nausea or vomiting  PHYSICAL EXAMINATION  VS:  T 96.5       P78       RR20      BP 124/73             WT 93.8(Lb)  GENERAL: no acute distress, normal body habitus NECK: supple, trachea midline, no neck masses, no thyroid tenderness, no thyromegaly LYMPHATICS: no LAN in the  neck, no supraclavicular LAN RESPIRATORY: breathing is even & unlabored, BS CTAB CARDIAC: RRR, no murmur,no extra heart sounds, no edema GI: abdomen soft, normal BS, no masses, no tenderness, no hepatomegaly, no splenomegaly PSYCHIATRIC: the patient is alert & oriented to person, affect & behavior appropriate  LABS/RADIOLOGY: 01/26/13 WBC 20.9 hemoglobin 9.9 hematocrit 32.9 sodium 137 potassium 3.9 glucose 172 BUN 23 creatinine 1.1 calcium 9.1 01/20/13 sodium 134 potassium 3.8 glucose 117 BUN 17 creatinine 1.22 calcium 9.4 WBC 16.1 hemoglobin 9.7 hematocrit 30.5  ASSESSMENT/PLAN:  Lumbar compression fracture (L2) - for home health PT, OT, nursing and CNA  Diabetes mellitus type 2 - diet controlled  hypothyroidism  - stable    chronic kidney disease stage III - stable  Myelodysplastic syndrome/Leukocytosis - followed up by outpatient oncology  CAD - stable  Hypertension - well controlled  Depression - stable  Overactive bladder - stable   Hyperlipidemia - continue Crestor  Bilateral hip pain - no complaints   I have filled out patient's discharge paperwork. Patient will receive home health PT, OT, nursing and CNA.   Total discharge time: Greater than 30 minutes Discharge time involved coordination of the discharge process with social worker, nursing staff and therapy department. Medical justification for home health services verified.  CPT CODE: 40981

## 2013-02-05 ENCOUNTER — Telehealth: Payer: Self-pay

## 2013-02-05 NOTE — Telephone Encounter (Signed)
I left a detailed message on [name]'s vm with our fax number to send [name] the order please at [phone]

## 2013-02-05 NOTE — Telephone Encounter (Signed)
Patient is at home.  I called [employer] at [phone]. Jacki Cones is going to have [name] return my call

## 2013-02-05 NOTE — Telephone Encounter (Signed)
[name] w/[facility] is calling stating pt is still in a lot of pain from the fracture at L2. [name] is asking if some Tramadol can be sent to [facility] in [place]?  Please advise?

## 2013-02-05 NOTE — Telephone Encounter (Signed)
So Have I seen any paperwork on her recent orders, I do not believe I have. Is she in a facility? If so I need to see her orders if they want me to manage her. If they have a facility doctor managing her they need to write the pain med order. If she is home I need to see a copy of the home health orders if we have them just print a copy and then I will write for some Tramadol

## 2013-02-06 NOTE — Telephone Encounter (Signed)
Debbie called back and left a message stating that she got my message but can't Dr Abner Greenspan look into the system to see pt was seen at the Hospital and pt was in a facility on Sept 9th.   I called and informed [name] that as soon as we have an order from there agency we will RX this medication.  [name] stated she would call her office and have them fax an order.

## 2013-02-07 NOTE — Telephone Encounter (Signed)
Paperwork came today and put on MD's desk

## 2013-02-07 NOTE — Telephone Encounter (Signed)
That is awesome. This is the first time I have seen a nursing home note in a chart. We are finally getting somewhere. Tramadol 50 mg po tid prn pain is fine, #60. 1 rf

## 2013-02-08 MED ORDER — TRAMADOL HCL 50 MG PO TABS: 50.0000 mg | ORAL_TABLET | Freq: Three times a day (TID) | ORAL | Status: DC | PRN

## 2013-02-08 NOTE — Addendum Note (Signed)
Addended by: Court Joy on: 02/08/2013 10:46 AM   Modules accepted: Orders

## 2013-02-08 NOTE — Telephone Encounter (Signed)
RX sent and pt informed 

## 2013-02-12 ENCOUNTER — Ambulatory Visit (HOSPITAL_BASED_OUTPATIENT_CLINIC_OR_DEPARTMENT_OTHER): Payer: Medicare Other

## 2013-02-12 ENCOUNTER — Other Ambulatory Visit (HOSPITAL_BASED_OUTPATIENT_CLINIC_OR_DEPARTMENT_OTHER): Payer: Medicare Other

## 2013-02-12 VITALS — BP 151/72 | HR 86 | Temp 97.4°F

## 2013-02-12 DIAGNOSIS — D469 Myelodysplastic syndrome, unspecified: Secondary | ICD-10-CM

## 2013-02-12 DIAGNOSIS — D649 Anemia, unspecified: Secondary | ICD-10-CM

## 2013-02-12 LAB — CBC WITH DIFFERENTIAL/PLATELET
BASO%: 0.2 % (ref 0.0–2.0)
LYMPH%: 3.4 % — ABNORMAL LOW (ref 14.0–49.7)
MCHC: 32.3 g/dL (ref 31.5–36.0)
MCV: 98.9 fL (ref 79.5–101.0)
MONO#: 1.3 10*3/uL — ABNORMAL HIGH (ref 0.1–0.9)
MONO%: 6.6 % (ref 0.0–14.0)
NEUT#: 17.6 10*3/uL — ABNORMAL HIGH (ref 1.5–6.5)
Platelets: 164 10*3/uL (ref 145–400)
RBC: 2.63 10*6/uL — ABNORMAL LOW (ref 3.70–5.45)
RDW: 18.7 % — ABNORMAL HIGH (ref 11.2–14.5)
WBC: 19.7 10*3/uL — ABNORMAL HIGH (ref 3.9–10.3)

## 2013-02-12 LAB — MORPHOLOGY

## 2013-02-12 MED ORDER — DARBEPOETIN ALFA-POLYSORBATE 300 MCG/0.6ML IJ SOLN
300.0000 ug | Freq: Once | INTRAMUSCULAR | Status: AC
  Administered 2013-02-12: 300 ug via SUBCUTANEOUS
  Filled 2013-02-12: qty 0.6

## 2013-02-14 ENCOUNTER — Other Ambulatory Visit: Payer: Self-pay | Admitting: Internal Medicine

## 2013-02-14 ENCOUNTER — Telehealth: Payer: Self-pay

## 2013-02-14 NOTE — Telephone Encounter (Signed)
Home Health left a message stating that they would like to put pt on hold for PT for 2 weeks?  They just need a verbal? Please advise?

## 2013-02-14 NOTE — Telephone Encounter (Signed)
OK to hold PT for 2 weeks

## 2013-02-14 NOTE — Telephone Encounter (Signed)
Taeiana informed.

## 2013-02-16 ENCOUNTER — Telehealth: Payer: Self-pay | Admitting: *Deleted

## 2013-02-16 ENCOUNTER — Other Ambulatory Visit: Payer: Self-pay | Admitting: *Deleted

## 2013-02-16 DIAGNOSIS — D510 Vitamin B12 deficiency anemia due to intrinsic factor deficiency: Secondary | ICD-10-CM

## 2013-02-16 DIAGNOSIS — C679 Malignant neoplasm of bladder, unspecified: Secondary | ICD-10-CM

## 2013-02-16 NOTE — Telephone Encounter (Signed)
Notified daughter of CBC results and need for counts on Monday with possible T & H. Daughter agrees to bring her and will wait in lobby for results.  Said to remove Melissa from contact list-she no longer is employed with them.

## 2013-02-19 ENCOUNTER — Telehealth: Payer: Self-pay | Admitting: *Deleted

## 2013-02-19 ENCOUNTER — Other Ambulatory Visit (HOSPITAL_BASED_OUTPATIENT_CLINIC_OR_DEPARTMENT_OTHER): Payer: Medicare Other | Admitting: Lab

## 2013-02-19 DIAGNOSIS — C679 Malignant neoplasm of bladder, unspecified: Secondary | ICD-10-CM

## 2013-02-19 DIAGNOSIS — D510 Vitamin B12 deficiency anemia due to intrinsic factor deficiency: Secondary | ICD-10-CM

## 2013-02-19 DIAGNOSIS — D469 Myelodysplastic syndrome, unspecified: Secondary | ICD-10-CM

## 2013-02-19 LAB — CBC WITH DIFFERENTIAL/PLATELET
Basophils Absolute: 0.1 10*3/uL (ref 0.0–0.1)
EOS%: 0.6 % (ref 0.0–7.0)
HCT: 27.8 % — ABNORMAL LOW (ref 34.8–46.6)
HGB: 8.8 g/dL — ABNORMAL LOW (ref 11.6–15.9)
LYMPH%: 3.3 % — ABNORMAL LOW (ref 14.0–49.7)
MCH: 32 pg (ref 25.1–34.0)
MCHC: 31.8 g/dL (ref 31.5–36.0)
MCV: 100.7 fL (ref 79.5–101.0)
MONO%: 5.6 % (ref 0.0–14.0)
NEUT%: 90.2 % — ABNORMAL HIGH (ref 38.4–76.8)
Platelets: 185 10*3/uL (ref 145–400)

## 2013-02-19 NOTE — Telephone Encounter (Signed)
Medication refill-protonix

## 2013-02-19 NOTE — Telephone Encounter (Signed)
Patient is here with her dtr. For labs and possible transfusion.  Her Hgb is improved to 8.8 today and she will not need to be transfused.  Her dtr. Will bring her back for her regular appt. In about 2 weeks, but she knows to call us if she starts having sx of her hgb dropping.

## 2013-02-21 ENCOUNTER — Other Ambulatory Visit: Payer: Self-pay | Admitting: Family Medicine

## 2013-02-21 NOTE — Telephone Encounter (Signed)
Looks like she is in SNF.  Refill sent.

## 2013-02-21 NOTE — Telephone Encounter (Signed)
eScribe request for refill on Paxil 20 mg Last filled - 06.24.14, #30x2 Last AEX - 07.08.14 w/PCP; 08.04.14 [w/Melissa O'Sullivan] Next AEX - 2 Months from 08.04.14 OV; No f/u appointment scheduled [& pt was back in hospital 09.05.14] Please Advise/SLS

## 2013-02-22 DIAGNOSIS — E1129 Type 2 diabetes mellitus with other diabetic kidney complication: Secondary | ICD-10-CM | POA: Insufficient documentation

## 2013-02-22 NOTE — Progress Notes (Signed)
Patient ID: [name], female   DOB: 04-Jun-1923, 79 y.o.   MRN: [id number]        HISTORY & PHYSICAL  DATE: 01/25/2013   FACILITY: [facility]  LEVEL OF CARE: SNF (31)  ALLERGIES:  Allergies  Allergen Reactions  . Codeine Anaphylaxis and Swelling    Tolerates morphine and dilaudid.  Marland Kitchen Penicillins Anaphylaxis and Swelling  . Sulfonamide Derivatives Anaphylaxis and Swelling    CHIEF COMPLAINT:  Manage hypothyroidism, diabetes mellitus, and chronic kidney disease stage III.    HISTORY OF PRESENT ILLNESS:  The patient is an 79 year-old, Caucasian female who was hospitalized secondary to recurrent falls and, after hospitalization, she is admitted to this facility for short-term rehabilitation.  She has the following problems:    DM:pt's DM remains stable.  Pt denies polyuria, polydipsia, polyphagia, changes in vision or hypoglycemic episodes.  No complications noted from the medication presently being used.  Last hemoglobin A1c is:  11/2012:  6.5.    HYPOTHYROIDISM: The hypothyroidism remains stable. No complications noted from the medications presently being used.  The patient denies fatigue or constipation.  Last TSH:  11/2012:   0.405.    CHRONIC KIDNEY DISEASE: The patient's chronic kidney disease remains stable.  Patient denies increasing lower extremity swelling or confusion. Last BUN and creatinine are:   Baseline creatinine is 1.3.  Creatinine at discharge was 1.09.     PAST MEDICAL HISTORY :  Past Medical History  Diagnosis Date  . CAD (coronary artery disease)     a. 10/2007 PCI Diag w/ 2.25 x 8mm Taxus Atom DES.;  b. 09/2011 Lexi MV: EF 69%, no ischemia/infarct.  Marland Kitchen PVD (peripheral vascular disease)   . Carotid artery stenosis     Dr. Graciela Husbands  . HTN (hypertension)   . Hyperlipidemia   . Hypothyroidism   . CKD (chronic kidney disease), stage III   . Diabetes mellitus 07/16/2011  . Allergic state 07/16/2011  . Insomnia 07/16/2011  . Recurrent UTI 07/16/2011  .  Asthma     childhood, recurrent at 31  . Myelodysplastic syndrome 04/02/2011  . Sinusitis 07/18/2011  . Anemia 07/18/2011  . Arthritis 07/18/2011  . Cataract extraction status of right eye 08/18/2011  . Anxiety and depression 08/18/2011  . Bronchitis, acute 12/20/2011  . Headache(784.0) 03/02/2012  . AV heart block     a. 10/2007 MDT Versa dual chamber PPM.  . Bladder cancer 12/2002  . Closed intertrochanteric fracture of left hip 05/27/2012  . Loss of weight 09/04/2012  . Renal failure, chronic 09/05/2012  . Conjunctivitis unspecified 11/21/2012  . Frequent falls     PAST SURGICAL HISTORY: Past Surgical History  Procedure Laterality Date  . Cholecystectomy    . Pcm - medtronic    . Abdominal hysterectomy    . Shoulder open rotator cuff repair    . Foot surgery      bunionectomy  . Intramedullary (im) nail intertrochanteric  05/27/2012    Procedure: INTRAMEDULLARY (IM) NAIL INTERTROCHANTRIC;  Surgeon: Eulas Post, MD;  Location: [facility] OR;  Service: Orthopedics;  Laterality: Left;  . Pacemaker insertion      SOCIAL HISTORY:  reports that she has never smoked. She has never used smokeless tobacco. She reports that she does not drink alcohol or use illicit drugs.  FAMILY HISTORY:  Family History  Problem Relation Age of Onset  . Heart disease Mother     chf  . Arthritis Mother   . Arthritis Father   .  Kidney disease Father   . Hypertension Father   . Cancer Brother     lung  . Arthritis Daughter     MVA with very serious injuries    CURRENT MEDICATIONS: Reviewed per [facility]  REVIEW OF SYSTEMS:  See HPI otherwise 14 point ROS is negative.  PHYSICAL EXAMINATION  VS:  T 97.9       P 75      RR 16      BP 140/80      POX 99%        WT (Lb)  GENERAL: no acute distress, normal body habitus EYES: conjunctivae normal, sclerae normal, normal eye lids MOUTH/THROAT: lips without lesions,no lesions in the mouth,tongue is without lesions,uvula elevates in midline NECK: supple, trachea  midline, no neck masses, no thyroid tenderness, no thyromegaly LYMPHATICS: no LAN in the neck, no supraclavicular LAN RESPIRATORY: breathing is even & unlabored, BS CTAB CARDIAC: RRR, no murmur,no extra heart sounds, no edema GI:  ABDOMEN: abdomen soft, normal BS, no masses, no tenderness  LIVER/SPLEEN: no hepatomegaly, no splenomegaly MUSCULOSKELETAL: HEAD: normal to inspection & palpation BACK: no kyphosis, scoliosis or spinal processes tenderness EXTREMITIES: LEFT UPPER EXTREMITY: full range of motion, normal strength & tone RIGHT UPPER EXTREMITY:  full range of motion, normal strength & tone LEFT LOWER EXTREMITY: strength decreased, range of motion moderate   RIGHT LOWER EXTREMITY: strength decreased, range of motion moderate   PSYCHIATRIC: the patient is alert & oriented to person, affect & behavior appropriate  LABS/RADIOLOGY: Chest x-ray:  No acute findings.      Lumbar spine x-ray:  Superior endplate compression fracture of L2 with 20% anterior height loss.      Right forearm x-ray:  Negative for acute fracture.    Left hip x-ray:  Showed status post ORIF, proximal left femur.  No acute bony abnormalities.    CT of the head:  No acute findings.    CT of the cervical spine:  No acute findings.    CT of the left hip:  Showed status post nailing of an intertrochanteric fracture.  No acute findings.    Chloride 94, glucose 170, otherwise BMP normal.    WBC 17.7, hemoglobin 9, MCV 97.3, platelets 123.    Total CK 97, CK-MB 2.7.    ASSESSMENT/PLAN:  Diabetes mellitus with renal complications.  Well controlled.    Hypothyroidism.  Well controlled.      Chronic kidney disease stage III.  Renal functions normal.  Reassess.    Myelodysplastic syndrome.  Reassess counts.    L2 compression fracture.  Continue pain medications.    Hypertension.  Blood pressure stable.    GERD.  Well controlled.     Hyperlipidemia.  Continue Crestor.    Check CBC and BMP.    I have  reviewed patient's medical records received at admission/from hospitalization.  CPT CODE: 16109

## 2013-03-01 ENCOUNTER — Telehealth: Payer: Self-pay

## 2013-03-01 NOTE — Telephone Encounter (Signed)
A Physical Therapist (couldn't understand the name) with [facility] left a message stating pt is getting a lot stronger and she would like permission to have OT? They just need a verbal of MD's permission with this?  Please advise?

## 2013-03-01 NOTE — Telephone Encounter (Signed)
OK to give VO to home health to go out and put the patient through terapy

## 2013-03-05 ENCOUNTER — Encounter (INDEPENDENT_AMBULATORY_CARE_PROVIDER_SITE_OTHER): Payer: Self-pay

## 2013-03-05 ENCOUNTER — Telehealth: Payer: Self-pay | Admitting: Oncology

## 2013-03-05 ENCOUNTER — Ambulatory Visit (HOSPITAL_BASED_OUTPATIENT_CLINIC_OR_DEPARTMENT_OTHER): Payer: Medicare Other

## 2013-03-05 ENCOUNTER — Other Ambulatory Visit (HOSPITAL_BASED_OUTPATIENT_CLINIC_OR_DEPARTMENT_OTHER): Payer: Medicare Other | Admitting: Lab

## 2013-03-05 VITALS — BP 136/72 | HR 89 | Temp 97.5°F

## 2013-03-05 DIAGNOSIS — D469 Myelodysplastic syndrome, unspecified: Secondary | ICD-10-CM

## 2013-03-05 DIAGNOSIS — D649 Anemia, unspecified: Secondary | ICD-10-CM

## 2013-03-05 LAB — CBC WITH DIFFERENTIAL/PLATELET
EOS%: 0.4 % (ref 0.0–7.0)
Eosinophils Absolute: 0.1 10*3/uL (ref 0.0–0.5)
LYMPH%: 2.9 % — ABNORMAL LOW (ref 14.0–49.7)
MCH: 31.6 pg (ref 25.1–34.0)
MCHC: 31.2 g/dL — ABNORMAL LOW (ref 31.5–36.0)
MCV: 101.3 fL — ABNORMAL HIGH (ref 79.5–101.0)
MONO%: 4.1 % (ref 0.0–14.0)
NEUT#: 24.2 10*3/uL — ABNORMAL HIGH (ref 1.5–6.5)
Platelets: 186 10*3/uL (ref 145–400)
RBC: 3.08 10*6/uL — ABNORMAL LOW (ref 3.70–5.45)

## 2013-03-05 LAB — MORPHOLOGY: RBC Comments: NORMAL

## 2013-03-05 MED ORDER — DARBEPOETIN ALFA-POLYSORBATE 300 MCG/0.6ML IJ SOLN
300.0000 ug | Freq: Once | INTRAMUSCULAR | Status: AC
  Administered 2013-03-05: 300 ug via SUBCUTANEOUS
  Filled 2013-03-05: qty 0.6

## 2013-03-05 NOTE — Telephone Encounter (Signed)
Dtr stopped by today after inj due to her mom had no more appts on schedule and gets lb/inj q3w. Pt missed last f/u due to being in the hosp. R/s 9/8 lb/LT to 11/10 w/inj after. dtr given new appt and scheduled for 11/10 lb/LT/inj 1:15pm.

## 2013-03-05 NOTE — Telephone Encounter (Signed)
Left a detailed message on nurses vm

## 2013-03-06 ENCOUNTER — Telehealth: Payer: Self-pay

## 2013-03-06 NOTE — Telephone Encounter (Signed)
FYI: Home Health called stating that pt fell again and had a skin tear on right knee. Pt doesn't seem like anything serious is done just a little sore.  Home Health [phone]

## 2013-03-07 ENCOUNTER — Telehealth: Payer: Self-pay | Admitting: *Deleted

## 2013-03-07 NOTE — Telephone Encounter (Signed)
She may need to come in for evaluation soon, see if she is willing

## 2013-03-07 NOTE — Telephone Encounter (Signed)
Received message from PT with [facility] (couldn't understand name). Called re: concern of pt's recent fall. Pt declined evaluation by MD. Also concerned that pt is home alone most of the time. Did not eat or take medications until after 12:45pm as she was unable to get out of bed. Pt's family is looking into hiring a sitter but also wants to reach out to community resources and available assistance. Requests order for OT and social work evaluation. Left detailed message ok to proceed with OT and social work eval and to call if further questions.

## 2013-03-08 NOTE — Telephone Encounter (Signed)
I informed Home Care Nurse that pt should be seen.  Nurse states that she is doing better today and she has been trying to get patient to come in.

## 2013-03-09 NOTE — Telephone Encounter (Signed)
FYI:  I called pt and informed that MD would like for her to come into the office. Pt states that she knows she should be seen but just doesn't want to do anything. Pt states she is trying to get ready to go and get her hair washed now and will call back when she gets home to see when she can come in.

## 2013-03-16 ENCOUNTER — Ambulatory Visit (HOSPITAL_BASED_OUTPATIENT_CLINIC_OR_DEPARTMENT_OTHER)
Admission: RE | Admit: 2013-03-16 | Discharge: 2013-03-16 | Disposition: A | Discharge status: Home / Self Care | Payer: Medicare Other | Source: Ambulatory Visit | Attending: Family Medicine | Admitting: Family Medicine

## 2013-03-16 ENCOUNTER — Encounter: Payer: Self-pay | Admitting: Family Medicine

## 2013-03-16 ENCOUNTER — Ambulatory Visit (INDEPENDENT_AMBULATORY_CARE_PROVIDER_SITE_OTHER): Payer: Medicare Other | Admitting: Family Medicine

## 2013-03-16 ENCOUNTER — Telehealth: Payer: Self-pay

## 2013-03-16 ENCOUNTER — Other Ambulatory Visit: Payer: Self-pay | Admitting: Family Medicine

## 2013-03-16 VITALS — BP 102/70 | HR 88 | Temp 98.0°F | Ht 60.0 in | Wt 87.0 lb

## 2013-03-16 DIAGNOSIS — R52 Pain, unspecified: Secondary | ICD-10-CM

## 2013-03-16 DIAGNOSIS — M259 Joint disorder, unspecified: Secondary | ICD-10-CM | POA: Insufficient documentation

## 2013-03-16 DIAGNOSIS — Z23 Encounter for immunization: Secondary | ICD-10-CM

## 2013-03-16 DIAGNOSIS — S2249XA Multiple fractures of ribs, unspecified side, initial encounter for closed fracture: Secondary | ICD-10-CM | POA: Insufficient documentation

## 2013-03-16 DIAGNOSIS — G4700 Insomnia, unspecified: Secondary | ICD-10-CM

## 2013-03-16 DIAGNOSIS — Z950 Presence of cardiac pacemaker: Secondary | ICD-10-CM | POA: Insufficient documentation

## 2013-03-16 DIAGNOSIS — E785 Hyperlipidemia, unspecified: Secondary | ICD-10-CM

## 2013-03-16 DIAGNOSIS — R079 Chest pain, unspecified: Secondary | ICD-10-CM | POA: Insufficient documentation

## 2013-03-16 DIAGNOSIS — S2242XD Multiple fractures of ribs, left side, subsequent encounter for fracture with routine healing: Secondary | ICD-10-CM

## 2013-03-16 DIAGNOSIS — M25519 Pain in unspecified shoulder: Secondary | ICD-10-CM | POA: Insufficient documentation

## 2013-03-16 DIAGNOSIS — E119 Type 2 diabetes mellitus without complications: Secondary | ICD-10-CM

## 2013-03-16 DIAGNOSIS — I10 Essential (primary) hypertension: Secondary | ICD-10-CM

## 2013-03-16 DIAGNOSIS — E039 Hypothyroidism, unspecified: Secondary | ICD-10-CM

## 2013-03-16 DIAGNOSIS — IMO0001 Reserved for inherently not codable concepts without codable children: Secondary | ICD-10-CM

## 2013-03-16 DIAGNOSIS — W19XXXA Unspecified fall, initial encounter: Secondary | ICD-10-CM | POA: Insufficient documentation

## 2013-03-16 MED ORDER — ALPRAZOLAM 0.25 MG PO TABS: 0.2500 mg | ORAL_TABLET | Freq: Every evening | ORAL | Status: DC | PRN

## 2013-03-16 NOTE — Patient Instructions (Signed)
4-6 oz of Gatorade twice a day with some protien Remember to eat every 3-4 hours with proteins and fiber Try Tylenol for pain first (Acetaminophen) 500 mg tabs 2 twice a day and may take 2 more throughout any given day for severe pain to max of 6 tabs in 24 ours Tramadol only for severe pain

## 2013-03-16 NOTE — Telephone Encounter (Signed)
Message copied by Eulis Manly on Fri Mar 16, 2013  4:32 PM ------      Message from: Danise Edge A      Created: Fri Mar 16, 2013  3:15 PM       Notify chronic tear in rotator cuff but no acute injury to shoulder, 3 broken ribs, can refer to ortho for the shoulder and or the ribs but if she wants to wait and see if she improves that is OK too, she just needs to continue to take it easy til healed ------

## 2013-03-16 NOTE — Telephone Encounter (Signed)
Patient informed of results and left message for daughter to call back.

## 2013-03-18 ENCOUNTER — Emergency Department (HOSPITAL_COMMUNITY): Payer: Medicare Other

## 2013-03-18 ENCOUNTER — Observation Stay (HOSPITAL_COMMUNITY)
Admission: EM | Admit: 2013-03-18 | Discharge: 2013-03-20 | DRG: 689 | Disposition: A | Discharge status: Skilled Nursing Facility | Payer: Medicare Other | Attending: Internal Medicine | Admitting: Internal Medicine

## 2013-03-18 ENCOUNTER — Encounter (HOSPITAL_COMMUNITY): Payer: Self-pay | Admitting: Emergency Medicine

## 2013-03-18 DIAGNOSIS — M1990 Unspecified osteoarthritis, unspecified site: Secondary | ICD-10-CM

## 2013-03-18 DIAGNOSIS — N3281 Overactive bladder: Secondary | ICD-10-CM

## 2013-03-18 DIAGNOSIS — E1129 Type 2 diabetes mellitus with other diabetic kidney complication: Secondary | ICD-10-CM

## 2013-03-18 DIAGNOSIS — S2242XD Multiple fractures of ribs, left side, subsequent encounter for fracture with routine healing: Secondary | ICD-10-CM

## 2013-03-18 DIAGNOSIS — Z7982 Long term (current) use of aspirin: Secondary | ICD-10-CM | POA: Insufficient documentation

## 2013-03-18 DIAGNOSIS — D469 Myelodysplastic syndrome, unspecified: Secondary | ICD-10-CM | POA: Insufficient documentation

## 2013-03-18 DIAGNOSIS — L989 Disorder of the skin and subcutaneous tissue, unspecified: Secondary | ICD-10-CM

## 2013-03-18 DIAGNOSIS — W06XXXA Fall from bed, initial encounter: Secondary | ICD-10-CM | POA: Insufficient documentation

## 2013-03-18 DIAGNOSIS — I4729 Other ventricular tachycardia: Secondary | ICD-10-CM | POA: Insufficient documentation

## 2013-03-18 DIAGNOSIS — N183 Chronic kidney disease, stage 3 unspecified: Secondary | ICD-10-CM | POA: Insufficient documentation

## 2013-03-18 DIAGNOSIS — Z8551 Personal history of malignant neoplasm of bladder: Secondary | ICD-10-CM | POA: Insufficient documentation

## 2013-03-18 DIAGNOSIS — R51 Headache: Secondary | ICD-10-CM | POA: Insufficient documentation

## 2013-03-18 DIAGNOSIS — I472 Ventricular tachycardia, unspecified: Secondary | ICD-10-CM | POA: Insufficient documentation

## 2013-03-18 DIAGNOSIS — E440 Moderate protein-calorie malnutrition: Secondary | ICD-10-CM

## 2013-03-18 DIAGNOSIS — D72829 Elevated white blood cell count, unspecified: Secondary | ICD-10-CM

## 2013-03-18 DIAGNOSIS — R0789 Other chest pain: Secondary | ICD-10-CM

## 2013-03-18 DIAGNOSIS — E039 Hypothyroidism, unspecified: Secondary | ICD-10-CM | POA: Insufficient documentation

## 2013-03-18 DIAGNOSIS — R071 Chest pain on breathing: Secondary | ICD-10-CM | POA: Insufficient documentation

## 2013-03-18 DIAGNOSIS — IMO0002 Reserved for concepts with insufficient information to code with codable children: Secondary | ICD-10-CM

## 2013-03-18 DIAGNOSIS — M25552 Pain in left hip: Secondary | ICD-10-CM

## 2013-03-18 DIAGNOSIS — H109 Unspecified conjunctivitis: Secondary | ICD-10-CM

## 2013-03-18 DIAGNOSIS — J329 Chronic sinusitis, unspecified: Secondary | ICD-10-CM

## 2013-03-18 DIAGNOSIS — N390 Urinary tract infection, site not specified: Principal | ICD-10-CM | POA: Insufficient documentation

## 2013-03-18 DIAGNOSIS — E785 Hyperlipidemia, unspecified: Secondary | ICD-10-CM | POA: Insufficient documentation

## 2013-03-18 DIAGNOSIS — E119 Type 2 diabetes mellitus without complications: Secondary | ICD-10-CM | POA: Insufficient documentation

## 2013-03-18 DIAGNOSIS — D510 Vitamin B12 deficiency anemia due to intrinsic factor deficiency: Secondary | ICD-10-CM

## 2013-03-18 DIAGNOSIS — IMO0001 Reserved for inherently not codable concepts without codable children: Secondary | ICD-10-CM | POA: Insufficient documentation

## 2013-03-18 DIAGNOSIS — I442 Atrioventricular block, complete: Secondary | ICD-10-CM

## 2013-03-18 DIAGNOSIS — E43 Unspecified severe protein-calorie malnutrition: Secondary | ICD-10-CM | POA: Insufficient documentation

## 2013-03-18 DIAGNOSIS — I2510 Atherosclerotic heart disease of native coronary artery without angina pectoris: Secondary | ICD-10-CM | POA: Insufficient documentation

## 2013-03-18 DIAGNOSIS — Z9181 History of falling: Secondary | ICD-10-CM | POA: Insufficient documentation

## 2013-03-18 DIAGNOSIS — Z950 Presence of cardiac pacemaker: Secondary | ICD-10-CM | POA: Insufficient documentation

## 2013-03-18 DIAGNOSIS — D539 Nutritional anemia, unspecified: Secondary | ICD-10-CM | POA: Insufficient documentation

## 2013-03-18 DIAGNOSIS — N289 Disorder of kidney and ureter, unspecified: Secondary | ICD-10-CM | POA: Diagnosis present

## 2013-03-18 DIAGNOSIS — Y92009 Unspecified place in unspecified non-institutional (private) residence as the place of occurrence of the external cause: Secondary | ICD-10-CM | POA: Insufficient documentation

## 2013-03-18 DIAGNOSIS — I10 Essential (primary) hypertension: Secondary | ICD-10-CM

## 2013-03-18 DIAGNOSIS — R42 Dizziness and giddiness: Secondary | ICD-10-CM | POA: Insufficient documentation

## 2013-03-18 DIAGNOSIS — F329 Major depressive disorder, single episode, unspecified: Secondary | ICD-10-CM

## 2013-03-18 DIAGNOSIS — N189 Chronic kidney disease, unspecified: Secondary | ICD-10-CM

## 2013-03-18 DIAGNOSIS — W19XXXD Unspecified fall, subsequent encounter: Secondary | ICD-10-CM

## 2013-03-18 DIAGNOSIS — M129 Arthropathy, unspecified: Secondary | ICD-10-CM | POA: Insufficient documentation

## 2013-03-18 DIAGNOSIS — I6529 Occlusion and stenosis of unspecified carotid artery: Secondary | ICD-10-CM | POA: Insufficient documentation

## 2013-03-18 DIAGNOSIS — I739 Peripheral vascular disease, unspecified: Secondary | ICD-10-CM | POA: Insufficient documentation

## 2013-03-18 DIAGNOSIS — D649 Anemia, unspecified: Secondary | ICD-10-CM

## 2013-03-18 DIAGNOSIS — W19XXXA Unspecified fall, initial encounter: Secondary | ICD-10-CM

## 2013-03-18 DIAGNOSIS — Z681 Body mass index (BMI) 19 or less, adult: Secondary | ICD-10-CM | POA: Insufficient documentation

## 2013-03-18 DIAGNOSIS — T7840XD Allergy, unspecified, subsequent encounter: Secondary | ICD-10-CM

## 2013-03-18 DIAGNOSIS — F411 Generalized anxiety disorder: Secondary | ICD-10-CM | POA: Insufficient documentation

## 2013-03-18 DIAGNOSIS — Z9861 Coronary angioplasty status: Secondary | ICD-10-CM | POA: Insufficient documentation

## 2013-03-18 DIAGNOSIS — F3289 Other specified depressive episodes: Secondary | ICD-10-CM | POA: Insufficient documentation

## 2013-03-18 DIAGNOSIS — I129 Hypertensive chronic kidney disease with stage 1 through stage 4 chronic kidney disease, or unspecified chronic kidney disease: Secondary | ICD-10-CM | POA: Insufficient documentation

## 2013-03-18 DIAGNOSIS — M25559 Pain in unspecified hip: Secondary | ICD-10-CM

## 2013-03-18 DIAGNOSIS — G609 Hereditary and idiopathic neuropathy, unspecified: Secondary | ICD-10-CM | POA: Insufficient documentation

## 2013-03-18 DIAGNOSIS — I951 Orthostatic hypotension: Secondary | ICD-10-CM

## 2013-03-18 DIAGNOSIS — R634 Abnormal weight loss: Secondary | ICD-10-CM

## 2013-03-18 DIAGNOSIS — E876 Hypokalemia: Secondary | ICD-10-CM

## 2013-03-18 DIAGNOSIS — G4700 Insomnia, unspecified: Secondary | ICD-10-CM

## 2013-03-18 DIAGNOSIS — C679 Malignant neoplasm of bladder, unspecified: Secondary | ICD-10-CM

## 2013-03-18 LAB — CBC WITH DIFFERENTIAL/PLATELET
Basophils Relative: 0 % (ref 0–1)
Eosinophils Absolute: 0 10*3/uL (ref 0.0–0.7)
Eosinophils Relative: 0 % (ref 0–5)
HCT: 23.8 % — ABNORMAL LOW (ref 36.0–46.0)
Hemoglobin: 7.7 g/dL — ABNORMAL LOW (ref 12.0–15.0)
Lymphocytes Relative: 3 % — ABNORMAL LOW (ref 12–46)
MCHC: 32.4 g/dL (ref 30.0–36.0)
MCV: 101.3 fL — ABNORMAL HIGH (ref 78.0–100.0)
Monocytes Relative: 4 % (ref 3–12)
Neutro Abs: 30.6 10*3/uL — ABNORMAL HIGH (ref 1.7–7.7)
Neutrophils Relative %: 93 % — ABNORMAL HIGH (ref 43–77)
Platelets: 173 10*3/uL (ref 150–400)
RBC: 2.35 MIL/uL — ABNORMAL LOW (ref 3.87–5.11)
WBC: 32.9 10*3/uL — ABNORMAL HIGH (ref 4.0–10.5)

## 2013-03-18 LAB — BASIC METABOLIC PANEL
BUN: 27 mg/dL — ABNORMAL HIGH (ref 6–23)
CO2: 31 mEq/L (ref 19–32)
Calcium: 9.2 mg/dL (ref 8.4–10.5)
Chloride: 94 mEq/L — ABNORMAL LOW (ref 96–112)
Creatinine, Ser: 1.07 mg/dL (ref 0.50–1.10)
GFR calc Af Amer: 53 mL/min — ABNORMAL LOW (ref >90)
GFR calc non Af Amer: 46 mL/min — ABNORMAL LOW (ref >90)
Potassium: 3 mEq/L — ABNORMAL LOW (ref 3.5–5.1)

## 2013-03-18 LAB — POCT I-STAT TROPONIN I

## 2013-03-18 LAB — CBC
HCT: 22.9 % — ABNORMAL LOW (ref 36.0–46.0)
Hemoglobin: 7.4 g/dL — ABNORMAL LOW (ref 12.0–15.0)
MCV: 100.4 fL — ABNORMAL HIGH (ref 78.0–100.0)
Platelets: 155 10*3/uL (ref 150–400)
RBC: 2.28 MIL/uL — ABNORMAL LOW (ref 3.87–5.11)
WBC: 25.3 10*3/uL — ABNORMAL HIGH (ref 4.0–10.5)

## 2013-03-18 LAB — URINALYSIS, ROUTINE W REFLEX MICROSCOPIC
Bilirubin Urine: NEGATIVE
Glucose, UA: NEGATIVE mg/dL
Nitrite: NEGATIVE
Specific Gravity, Urine: 1.023 (ref 1.005–1.030)
Urobilinogen, UA: 0.2 mg/dL (ref 0.0–1.0)
pH: 5.5 (ref 5.0–8.0)

## 2013-03-18 LAB — GLUCOSE, CAPILLARY: Glucose-Capillary: 142 mg/dL — ABNORMAL HIGH (ref 70–99)

## 2013-03-18 LAB — CK: Total CK: 88 U/L (ref 7–177)

## 2013-03-18 LAB — CREATININE, SERUM: GFR calc Af Amer: 48 mL/min — ABNORMAL LOW (ref >90)

## 2013-03-18 LAB — PRO B NATRIURETIC PEPTIDE: Pro B Natriuretic peptide (BNP): 2838 pg/mL — ABNORMAL HIGH (ref 0–450)

## 2013-03-18 LAB — TROPONIN I: Troponin I: <0.3 ng/mL (ref <0.30)

## 2013-03-18 LAB — URINE MICROSCOPIC-ADD ON

## 2013-03-18 MED ORDER — ACETAMINOPHEN 325 MG PO TABS
650.0000 mg | ORAL_TABLET | Freq: Four times a day (QID) | ORAL | Status: DC | PRN
  Administered 2013-03-19 – 2013-03-20 (×3): 650 mg via ORAL
  Filled 2013-03-18 (×3): qty 2

## 2013-03-18 MED ORDER — BOOST PO LIQD
237.0000 mL | Freq: Three times a day (TID) | ORAL | Status: DC
  Administered 2013-03-18: 237 mL via ORAL
  Filled 2013-03-18 (×6): qty 237

## 2013-03-18 MED ORDER — ACETAMINOPHEN 325 MG PO TABS
650.0000 mg | ORAL_TABLET | Freq: Once | ORAL | Status: AC
  Administered 2013-03-18: 650 mg via ORAL
  Filled 2013-03-18: qty 2

## 2013-03-18 MED ORDER — PANTOPRAZOLE SODIUM 40 MG PO TBEC
40.0000 mg | DELAYED_RELEASE_TABLET | Freq: Every day | ORAL | Status: DC
  Administered 2013-03-19 – 2013-03-20 (×2): 40 mg via ORAL
  Filled 2013-03-18 (×2): qty 1

## 2013-03-18 MED ORDER — ASPIRIN EC 81 MG PO TBEC
81.0000 mg | DELAYED_RELEASE_TABLET | Freq: Every day | ORAL | Status: DC
  Administered 2013-03-18 – 2013-03-20 (×3): 81 mg via ORAL
  Filled 2013-03-18 (×4): qty 1

## 2013-03-18 MED ORDER — POTASSIUM CHLORIDE 20 MEQ/15ML (10%) PO LIQD
60.0000 meq | Freq: Once | ORAL | Status: AC
  Administered 2013-03-18: 60 meq via ORAL
  Filled 2013-03-18: qty 45

## 2013-03-18 MED ORDER — FOSFOMYCIN TROMETHAMINE 3 G PO PACK
3.0000 g | PACK | ORAL | Status: DC
  Administered 2013-03-18: 22:00:00 3 g via ORAL
  Filled 2013-03-18: qty 3

## 2013-03-18 MED ORDER — LEVOTHYROXINE SODIUM 112 MCG PO TABS
112.0000 ug | ORAL_TABLET | Freq: Every day | ORAL | Status: DC
  Administered 2013-03-19 – 2013-03-20 (×2): 112 ug via ORAL
  Filled 2013-03-18 (×3): qty 1

## 2013-03-18 MED ORDER — ONDANSETRON HCL 4 MG/2ML IJ SOLN: 4.0000 mg | Freq: Four times a day (QID) | INTRAMUSCULAR | Status: DC | PRN

## 2013-03-18 MED ORDER — PAROXETINE HCL 10 MG PO TABS
10.0000 mg | ORAL_TABLET | Freq: Every evening | ORAL | Status: DC
  Administered 2013-03-18 – 2013-03-20 (×3): 10 mg via ORAL
  Filled 2013-03-18 (×3): qty 1

## 2013-03-18 MED ORDER — POTASSIUM CHLORIDE CRYS ER 20 MEQ PO TBCR: 60.0000 meq | EXTENDED_RELEASE_TABLET | Freq: Once | ORAL | Status: DC

## 2013-03-18 MED ORDER — INSULIN ASPART 100 UNIT/ML ~~LOC~~ SOLN
0.0000 [IU] | Freq: Three times a day (TID) | SUBCUTANEOUS | Status: DC
  Administered 2013-03-19 (×2): 1 [IU] via SUBCUTANEOUS
  Administered 2013-03-19: 13:00:00 3 [IU] via SUBCUTANEOUS
  Administered 2013-03-20: 1 [IU] via SUBCUTANEOUS
  Administered 2013-03-20 (×2): 2 [IU] via SUBCUTANEOUS

## 2013-03-18 MED ORDER — DIAZEPAM 5 MG/ML IJ SOLN: 5.0000 mg | Freq: Once | INTRAMUSCULAR | Status: DC

## 2013-03-18 MED ORDER — NITROFURANTOIN MONOHYD MACRO 100 MG PO CAPS: 100.0000 mg | ORAL_CAPSULE | Freq: Two times a day (BID) | ORAL | Status: DC

## 2013-03-18 MED ORDER — ATORVASTATIN CALCIUM 40 MG PO TABS
40.0000 mg | ORAL_TABLET | Freq: Every day | ORAL | Status: DC
  Administered 2013-03-18 – 2013-03-20 (×3): 40 mg via ORAL
  Filled 2013-03-18 (×3): qty 1

## 2013-03-18 MED ORDER — ENOXAPARIN SODIUM 30 MG/0.3ML ~~LOC~~ SOLN
20.0000 mg | SUBCUTANEOUS | Status: DC
  Administered 2013-03-18 – 2013-03-19 (×2): 20 mg via SUBCUTANEOUS
  Filled 2013-03-18 (×4): qty 0.2

## 2013-03-18 MED ORDER — DARIFENACIN HYDROBROMIDE ER 7.5 MG PO TB24
7.5000 mg | ORAL_TABLET | Freq: Every day | ORAL | Status: DC
  Administered 2013-03-19 – 2013-03-20 (×2): 7.5 mg via ORAL
  Filled 2013-03-18 (×2): qty 1

## 2013-03-18 MED ORDER — SODIUM CHLORIDE 0.9 % IV SOLN
INTRAVENOUS | Status: DC
  Administered 2013-03-18: 21:00:00 via INTRAVENOUS
  Administered 2013-03-19: 50 mL/h via INTRAVENOUS

## 2013-03-18 MED ORDER — SODIUM CHLORIDE 0.9 % IV SOLN
Freq: Once | INTRAVENOUS | Status: AC
  Administered 2013-03-18: 15:00:00 via INTRAVENOUS

## 2013-03-18 MED ORDER — DOCUSATE SODIUM 100 MG PO CAPS
100.0000 mg | ORAL_CAPSULE | Freq: Two times a day (BID) | ORAL | Status: DC
  Administered 2013-03-19 – 2013-03-20 (×2): 100 mg via ORAL
  Filled 2013-03-18 (×5): qty 1

## 2013-03-18 MED ORDER — ONDANSETRON HCL 4 MG PO TABS: 4.0000 mg | ORAL_TABLET | Freq: Four times a day (QID) | ORAL | Status: DC | PRN

## 2013-03-18 NOTE — H&P (Signed)
[facility] History and Physical  [name] WGN:[id number] DOB: 08-14-1923 DOA: 03/18/2013  Referring physician: Dr Park Pope PCP: Danise Edge, MD  Specialists: none  Chief Complaint: frequent fall, weakness.   HPI: [name] is a 79 y.o. female PMH significant for pacemaker for symptomatic   Bradycardia, CAD S/P DES to D1 2009, myelodysplastic syndrome, HB baseline 9 to 10, who present to ED after another fall the day of admission. Patient sustain a fall last week, she was seen by her PCP. Patient was found to have 3 ribs fracture. Patient fell today while she was trying to stand up. She feel weakness spell and fall. She felt shaking all over at time of fall. Patient denies chest pain, palpitation prior to episode. She only now that she falls.  She was supously treated for UTI last week.  She is still complaining of ribs pain. Worse after this fall.   Review of Systems: negative except as per HPI.   Past Medical History  Diagnosis Date  . CAD (coronary artery disease)     a. 10/2007 PCI Diag w/ 2.25 x 8mm Taxus Atom DES.;  b. 09/2011 Lexi MV: EF 69%, no ischemia/infarct.  Marland Kitchen PVD (peripheral vascular disease)   . Carotid artery stenosis     Dr. Graciela Husbands  . HTN (hypertension)   . Hyperlipidemia   . Hypothyroidism   . CKD (chronic kidney disease), stage III   . Diabetes mellitus 07/16/2011  . Allergic state 07/16/2011  . Insomnia 07/16/2011  . Recurrent UTI 07/16/2011  . Asthma     childhood, recurrent at 46  . Myelodysplastic syndrome 04/02/2011  . Sinusitis 07/18/2011  . Anemia 07/18/2011  . Arthritis 07/18/2011  . Cataract extraction status of right eye 08/18/2011  . Anxiety and depression 08/18/2011  . Bronchitis, acute 12/20/2011  . Headache(784.0) 03/02/2012  . AV heart block     a. 10/2007 MDT Versa dual chamber PPM.  . Bladder cancer 12/2002  . Closed intertrochanteric fracture of left hip 05/27/2012  . Loss of weight 09/04/2012  . Renal failure, chronic 09/05/2012  .  Conjunctivitis unspecified 11/21/2012  . Frequent falls    Past Surgical History  Procedure Laterality Date  . Cholecystectomy    . Pcm - medtronic    . Abdominal hysterectomy    . Shoulder open rotator cuff repair    . Foot surgery      bunionectomy  . Intramedullary (im) nail intertrochanteric  05/27/2012    Procedure: INTRAMEDULLARY (IM) NAIL INTERTROCHANTRIC;  Surgeon: Eulas Post, MD;  Location: [facility] OR;  Service: Orthopedics;  Laterality: Left;  . Pacemaker insertion     Social History:  reports that she has never smoked. She has never used smokeless tobacco. She reports that she does not drink alcohol or use illicit drugs.   Allergies  Allergen Reactions  . Codeine Anaphylaxis and Swelling    Tolerates morphine and dilaudid.  Marland Kitchen Penicillins Anaphylaxis and Swelling  . Sulfonamide Derivatives Anaphylaxis and Swelling    Family History  Problem Relation Age of Onset  . Heart disease Mother     chf  . Arthritis Mother   . Arthritis Father   . Kidney disease Father   . Hypertension Father   . Cancer Brother     lung  . Arthritis Daughter     MVA with very serious injuries    Prior to Admission medications   Medication Sig Start Date End Date Taking? Authorizing Provider  acetaminophen (TYLENOL) 325 MG  tablet Take 650 mg by mouth every 6 (six) hours as needed for pain.   Yes Historical Provider, MD  aspirin EC 81 MG tablet Take 81 mg by mouth daily.   Yes Historical Provider, MD  darifenacin (ENABLEX) 7.5 MG 24 hr tablet Take 7.5 mg by mouth daily.   Yes Historical Provider, MD  lactose free nutrition (BOOST) LIQD Take 237 mLs by mouth 3 (three) times daily between meals.   Yes Historical Provider, MD  levothyroxine (SYNTHROID, LEVOTHROID) 112 MCG tablet Take 1 tablet (112 mcg total) by mouth daily. 06/09/12  Yes Daniel J Angiulli, PA-C  pantoprazole (PROTONIX) 40 MG tablet Take 40 mg by mouth daily.   Yes Historical Provider, MD  PARoxetine (PAXIL) 20 MG tablet Take 10  mg by mouth every evening.   Yes Historical Provider, MD  rosuvastatin (CRESTOR) 20 MG tablet Take 1 tablet (20 mg total) by mouth at bedtime. 06/09/12  Yes Daniel J Angiulli, PA-C  vitamin B-12 (CYANOCOBALAMIN) 500 MCG tablet Take 500 mcg by mouth daily.   Yes Historical Provider, MD   Physical Exam: Filed Vitals:   03/18/13 1830  BP: 122/76  Pulse: 93  Temp:   Resp: 15   General Appearance:    Alert, cooperative, no distress, appears stated age  Head:    Normocephalic, without obvious abnormality, atraumatic  Eyes:    PERRL, conjunctiva/corneas clear, EOM's intact,    Ears:    Normal TM's and external ear canals, both ears  Nose:   Nares normal, septum midline, mucosa normal, no drainage    or sinus tenderness  Throat:   Lips, mucosa, and tongue normal; teeth and gums normal  Neck:   Supple, symmetrical, trachea midline, no adenopathy;    thyroid:  no enlargement/tenderness/nodules; no carotid   bruit or JVD  Back:     Symmetric, no curvature, ROM normal, no CVA tenderness  Lungs:     Clear to auscultation bilaterally, respirations unlabored  Chest Wall:    No tenderness or deformity   Heart:    Regular rate and rhythm, S1 and S2 normal, no murmur, rub   or gallop     Abdomen:     Soft, non-tender, bowel sounds active all four quadrants,    no masses, no organomegaly        Extremities:   Extremities normal, atraumatic, no cyanosis or edema  Pulses:   2+ and symmetric all extremities  Skin:   Skin color, texture, turgor normal, no rashes or lesions  Lymph nodes:   Cervical, supraclavicular, and axillary nodes normal  Neurologic:   CNII-XII intact, right lower extremity weaker than left      Labs on Admission:  Basic Metabolic Panel:  Recent Labs Lab 03/18/13 1510  NA 138  K 3.0*  CL 94*  CO2 31  GLUCOSE 236*  BUN 27*  CREATININE 1.07  CALCIUM 9.2   Liver Function Tests: No results found for this basename: AST, ALT, ALKPHOS, BILITOT, PROT, ALBUMIN,  in the last  168 hours No results found for this basename: LIPASE, AMYLASE,  in the last 168 hours No results found for this basename: AMMONIA,  in the last 168 hours CBC:  Recent Labs Lab 03/18/13 1510  WBC 32.9*  NEUTROABS 30.6*  HGB 7.7*  HCT 23.8*  MCV 101.3*  PLT 173   Cardiac Enzymes:  Recent Labs Lab 03/18/13 1510 03/18/13 1712  CKTOTAL 88  --   TROPONINI  --  <0.30    BNP (last  3 results)  Recent Labs  04/20/12 1226 12/03/12 0825 03/18/13 1510  PROBNP 113.0* 2120.0* 2838.0*   CBG: No results found for this basename: GLUCAP,  in the last 168 hours  Radiological Exams on Admission: Dg Chest 1 View  03/18/2013   CLINICAL DATA:  Fall today. Chest and left shoulder pain.  EXAM: CHEST - 1 VIEW  COMPARISON:  03/16/2013 and 01/19/2013 radiographs.  FINDINGS: Left subclavian pacemaker leads appear unchanged within the right atrium and right ventricle. The heart size is normal. The aorta is diffusely tortuous. The lungs are clear. There is no pleural effusion or pneumothorax.  The bones are diffusely demineralized. The subacromial space of both shoulders is obliterated consistent with chronic rotator cuff tears. No acute osseous findings are identified.  IMPRESSION: No acute cardiopulmonary process demonstrated. Chronic bilateral rotator cuff tears.   Electronically Signed   By: Roxy Horseman M.D.   On: 03/18/2013 16:35   Ct Head Wo Contrast  03/18/2013   CLINICAL DATA:  Fall. Dizziness with hypertension. Recent chemotherapy for bladder cancer. History of diabetes.  EXAM: CT HEAD WITHOUT CONTRAST  CT CERVICAL SPINE WITHOUT CONTRAST  TECHNIQUE: Multidetector CT imaging of the head and cervical spine was performed following the standard protocol without intravenous contrast. Multiplanar CT image reconstructions of the cervical spine were also generated.  COMPARISON:  Prior examinations 01/19/2013.  FINDINGS: CT HEAD FINDINGS  There is stable mild atrophy with confluent periventricular low  density consistent with chronic small vessel ischemic change. No cortically based infarct is identified. There is no evidence of acute intracranial hemorrhage, mass lesion, brain edema or extra-axial fluid collection.  The visualized paranasal sinuses, mastoid air cells and middle ears are clear. There is stable calvarial demineralization and hyperostosis. No fractures or focal lesions identified.  CT CERVICAL SPINE FINDINGS  There is no evidence of acute cervical spine fracture, traumatic subluxation or static sign of instability. There is a mild superior endplate compression deformity at T1 which appears unchanged. There is also a chronic compression deformity at T4, unchanged for chest CT performed in July.  There is multilevel spondylosis with disc space loss and uncinate spurring, greatest from C4-5 through C6-7. No acute soft tissue findings are evident. There are diffuse vascular calcifications. There is an aberrant right subclavian artery.  IMPRESSION: 1. No acute intracranial are calvarial findings. 2. No evidence of acute cervical spine fracture, traumatic subluxation or static signs of instability. Compression deformities at T1 and T4 appear unchanged. 3. Stable chronic periventricular white matter disease, atherosclerosis and cervical spondylosis.   Electronically Signed   By: Roxy Horseman M.D.   On: 03/18/2013 16:11   Ct Cervical Spine Wo Contrast  03/18/2013   CLINICAL DATA:  Fall. Dizziness with hypertension. Recent chemotherapy for bladder cancer. History of diabetes.  EXAM: CT HEAD WITHOUT CONTRAST  CT CERVICAL SPINE WITHOUT CONTRAST  TECHNIQUE: Multidetector CT imaging of the head and cervical spine was performed following the standard protocol without intravenous contrast. Multiplanar CT image reconstructions of the cervical spine were also generated.  COMPARISON:  Prior examinations 01/19/2013.  FINDINGS: CT HEAD FINDINGS  There is stable mild atrophy with confluent periventricular low density  consistent with chronic small vessel ischemic change. No cortically based infarct is identified. There is no evidence of acute intracranial hemorrhage, mass lesion, brain edema or extra-axial fluid collection.  The visualized paranasal sinuses, mastoid air cells and middle ears are clear. There is stable calvarial demineralization and hyperostosis. No fractures or focal lesions identified.  CT  CERVICAL SPINE FINDINGS  There is no evidence of acute cervical spine fracture, traumatic subluxation or static sign of instability. There is a mild superior endplate compression deformity at T1 which appears unchanged. There is also a chronic compression deformity at T4, unchanged for chest CT performed in July.  There is multilevel spondylosis with disc space loss and uncinate spurring, greatest from C4-5 through C6-7. No acute soft tissue findings are evident. There are diffuse vascular calcifications. There is an aberrant right subclavian artery.  IMPRESSION: 1. No acute intracranial are calvarial findings. 2. No evidence of acute cervical spine fracture, traumatic subluxation or static signs of instability. Compression deformities at T1 and T4 appear unchanged. 3. Stable chronic periventricular white matter disease, atherosclerosis and cervical spondylosis.   Electronically Signed   By: Roxy Horseman M.D.   On: 03/18/2013 16:11   Dg Shoulder Left  03/18/2013   CLINICAL DATA:  Initial encounter for left shoulder injury due to a fall earlier today.  EXAM: LEFT SHOULDER - 2+ VIEW  COMPARISON:  03/16/2013, 10/23/2010.  FINDINGS: No evidence of acute fracture or glenohumeral dislocation. Acromioclavicular joint intact with degenerative changes. Marked narrowing of the subacromial space, not significantly changed since 2012. Generalized osseous demineralization.  IMPRESSION: 1. No acute osseous abnormality. 2. Stable marked narrowing of the subacromial space consistent with chronic supraspinatus tendon disease. 3. Stable  moderate degenerative changes involving the AC joint. 4. Generalized osseous demineralization.   Electronically Signed   By: Hulan Saas M.D.   On: 03/18/2013 16:24    EKG: Independently reviewed. Ventricular pace rhythm.   Assessment/Plan Active Problems:   Myelodysplastic syndrome   NSVT (nonsustained ventricular tachycardia)   Macrocytic anemia   Falls   Acute renal insufficiency  1-Frequent fall; weakness: Patient will increase BUN, she might be dehydrated. I will start IV fluids. I will check orthostatic. Admit to telemetry. Cycle cardiac enzymes. Need PT consult. CT head negative for acute bleed. Unable to order MRI due to history of pacemaker. PT evaluation. Check TSH and B 12.   2-Leukocytosis: thought to be secondary to myelodysplastic syndrome. WBC more elevated than baseline. Will treat for UTI.   3-UTI: UA with 21-to 50 WBC. Follow urine culture. Last urine culture grew E coli sensitive to Macrobid. I will start Macrobid BID.   4-Acute renal insufficiency on chronic: BUN increase to 27. I will start IV fluids. Repeat renal function in am.   5-Anemia; secondary to myelodysplastic syndrome. Baseline HB 9 to 10. She recently received Aranest per ED report. Repeat CBC in am.  If hb lower might need Blood transfusion.   6-History of Diabetes, Hyperglycemia: not on medications per med reconcilaiation. Check HB A1c, will order SSI.  7-Hypothyroidism: Continue with synthroid.    Code Status: full code.  Family Communication: none at bedside.  Disposition Plan: admit under observation for further evaluation of weakness, frequent fall.   Time spent: 65 minutes.   Fatih Stalvey [facility] Pager [phone]  If 7PM-7AM, please contact night-coverage [email or web address] Password [email or web address] 03/18/2013, 7:00 PM

## 2013-03-18 NOTE — ED Provider Notes (Signed)
MSE was initiated and I personally evaluated the patient and placed orders (if any) at  2:27 PM on March 18, 2013.  Pt presents with a fall that occurred this morning.  She states "I fall all the time."  After a recent fall she was diagnosed with broken ribs and torn left rotator cuff.  This morning she reports that she was getting out of bed when she fell onto the floor.  She denies LOC.  She was not able to stand up on her own.  She was able to press her life alert but lay on the floor for 2 hours before anyone arrived.  Pt now complains of moderate-to-severe left-sided shoulder and rib pain that are greatly worsened by palpation.  She also complains of SOB which has been worsened after the fall.  She denies abdominal pain.  Pt lives at home and has a caretaker.    And rigid C-spine collar, pupils equal round and reactive to light and accommodation. There is no tenderness to deep palpation of the posterior C-spine, patient's mucous membranes are dry. Patient is exquisitely tender to palpation on the left anterior chest, there is air movement. Abdominal exam is benign, patient can move all extremities and major joints without pain, pelvis is nontender to palpation.  The patient appears stable so that the remainder of the MSE may be completed by another provider.  Wynetta Emery, PA-C 03/18/13 [phone]

## 2013-03-18 NOTE — ED Provider Notes (Signed)
Medical screening examination/treatment/procedure(s) were performed by non-physician practitioner and as supervising physician I was immediately available for consultation/collaboration.  EKG Interpretation   None         Adanely Reynoso M Carman Essick, MD 03/18/13 1713 

## 2013-03-18 NOTE — ED Provider Notes (Signed)
CSN: [id number]     Arrival date & time 03/18/13  1401 History   First MD Initiated Contact with Patient 03/18/13 1441     Chief Complaint  Patient presents with  . Fall   (Consider location/radiation/quality/duration/timing/severity/associated sxs/prior Treatment) HPI Comments: Pt is a 79 y.o. female with Pmhx as above who presents with fall.  Pt states she had gotten out of bed, was walking through hall when legs gave out.  She hit head on wall, fell onto L side.  She complains of mild h/a, L shoulder, L chest wall pain.   Pain worse w/ mvmt, better when still.  She had another fall about 5 days ago, was diagnosed w/ 3 L sided rib fractures. Rotator cuff tear 2 days ago.  Denies recent illness, cough, fever, SOB, ab pain, n/v, d/a, dysuria.  +ttp over L chest, L shoulder.  Daughter states she was recently in discussion w/ a Child psychotherapist about her living situation and has decided to try to stay at home.  She had home eval, has paid help during the day, daughter helps evening & weekends.    Patient is a 79 y.o. female presenting with fall. The history is provided by the patient and a relative.  Fall This is a recurrent problem. The current episode started 1 to 2 hours ago. The problem occurs every several days. The problem has not changed since onset.Pertinent negatives include no chest pain, no abdominal pain, no headaches and no shortness of breath. Exacerbated by: movement, palpation. The symptoms are relieved by rest. She has tried acetaminophen for the symptoms. The treatment provided mild relief.    Past Medical History  Diagnosis Date  . CAD (coronary artery disease)     a. 10/2007 PCI Diag w/ 2.25 x 8mm Taxus Atom DES.;  b. 09/2011 Lexi MV: EF 69%, no ischemia/infarct.  Marland Kitchen PVD (peripheral vascular disease)   . Carotid artery stenosis     Dr. Graciela Husbands  . HTN (hypertension)   . Hyperlipidemia   . Hypothyroidism   . CKD (chronic kidney disease), stage III   . Diabetes mellitus 07/16/2011  .  Allergic state 07/16/2011  . Insomnia 07/16/2011  . Recurrent UTI 07/16/2011  . Asthma     childhood, recurrent at 76  . Myelodysplastic syndrome 04/02/2011  . Sinusitis 07/18/2011  . Anemia 07/18/2011  . Arthritis 07/18/2011  . Cataract extraction status of right eye 08/18/2011  . Anxiety and depression 08/18/2011  . Bronchitis, acute 12/20/2011  . Headache(784.0) 03/02/2012  . AV heart block     a. 10/2007 MDT Versa dual chamber PPM.  . Bladder cancer 12/2002  . Closed intertrochanteric fracture of left hip 05/27/2012  . Loss of weight 09/04/2012  . Renal failure, chronic 09/05/2012  . Conjunctivitis unspecified 11/21/2012  . Frequent falls   . Fracture three ribs-closed 03/19/2013   Past Surgical History  Procedure Laterality Date  . Cholecystectomy    . Pcm - medtronic    . Abdominal hysterectomy    . Shoulder open rotator cuff repair    . Foot surgery      bunionectomy  . Intramedullary (im) nail intertrochanteric  05/27/2012    Procedure: INTRAMEDULLARY (IM) NAIL INTERTROCHANTRIC;  Surgeon: Eulas Post, MD;  Location: [facility] OR;  Service: Orthopedics;  Laterality: Left;  . Pacemaker insertion     Family History  Problem Relation Age of Onset  . Heart disease Mother     chf  . Arthritis Mother   . Arthritis Father   .  Kidney disease Father   . Hypertension Father   . Cancer Brother     lung  . Arthritis Daughter     MVA with very serious injuries   History  Substance Use Topics  . Smoking status: Never Smoker   . Smokeless tobacco: Never Used  . Alcohol Use: No   OB History   Grav Para Term Preterm Abortions TAB SAB Ect Mult Living                 Review of Systems  Constitutional: Negative for fever, chills, diaphoresis, activity change, appetite change and fatigue.  HENT: Negative for congestion, facial swelling, rhinorrhea and sore throat.   Eyes: Negative for photophobia and discharge.  Respiratory: Negative for cough, chest tightness and shortness of breath.     Cardiovascular: Negative for chest pain, palpitations and leg swelling.  Gastrointestinal: Negative for nausea, vomiting, abdominal pain and diarrhea.  Endocrine: Negative for polydipsia and polyuria.  Genitourinary: Negative for dysuria, frequency, difficulty urinating and pelvic pain.  Musculoskeletal: Negative for arthralgias, back pain, neck pain and neck stiffness.  Skin: Negative for color change and wound.  Allergic/Immunologic: Negative for immunocompromised state.  Neurological: Negative for facial asymmetry, weakness, numbness and headaches.  Hematological: Does not bruise/bleed easily.  Psychiatric/Behavioral: Negative for confusion and agitation.    Allergies  Codeine; Penicillins; and Sulfonamide derivatives  Home Medications   No current outpatient prescriptions on file. BP 127/55  Pulse 70  Temp(Src) 97.7 F (36.5 C) (Oral)  Resp 18  Ht 5' (1.524 m)  Wt 87 lb 15.4 oz (39.9 kg)  BMI 17.18 kg/m2  SpO2 100% Physical Exam  Constitutional: She is oriented to person, place, and time. No distress.  Thin  HENT:  Head: Normocephalic. Head is with contusion.    Mouth/Throat: No oropharyngeal exudate.  Eyes: Pupils are equal, round, and reactive to light.  Neck: Normal range of motion. Neck supple.  Cardiovascular: Normal rate, regular rhythm and normal heart sounds.  Exam reveals no gallop and no friction rub.   No murmur heard. Pulmonary/Chest: Effort normal and breath sounds normal. No respiratory distress. She has no wheezes. She has no rales. She exhibits bony tenderness.    Abdominal: Soft. Bowel sounds are normal. She exhibits no distension and no mass. There is no tenderness. There is no rebound and no guarding.  Musculoskeletal: Normal range of motion. She exhibits no edema and no tenderness.  Neurological: She is alert and oriented to person, place, and time. She has normal strength. She displays no tremor. No cranial nerve deficit or sensory deficit. She  exhibits normal muscle tone. She displays a negative Romberg sign. Coordination abnormal. GCS eye subscore is 4. GCS verbal subscore is 5. GCS motor subscore is 6.  Skin: Skin is warm and dry.  Psychiatric: She has a normal mood and affect.    ED Course  Procedures (including critical care time) Labs Review Labs Reviewed  BASIC METABOLIC PANEL - Abnormal; Notable for the following:    Potassium 3.0 (*)    Chloride 94 (*)    Glucose, Bld 236 (*)    BUN 27 (*)    GFR calc non Af Amer 46 (*)    GFR calc Af Amer 53 (*)    All other components within normal limits  URINALYSIS, ROUTINE W REFLEX MICROSCOPIC - Abnormal; Notable for the following:    APPearance CLOUDY (*)    Hgb urine dipstick MODERATE (*)    Protein, ur 100 (*)  Leukocytes, UA SMALL (*)    All other components within normal limits  CBC WITH DIFFERENTIAL - Abnormal; Notable for the following:    WBC 32.9 (*)    RBC 2.35 (*)    Hemoglobin 7.7 (*)    HCT 23.8 (*)    MCV 101.3 (*)    RDW 17.1 (*)    Neutrophils Relative % 93 (*)    Lymphocytes Relative 3 (*)    Neutro Abs 30.6 (*)    Monocytes Absolute 1.3 (*)    All other components within normal limits  PRO B NATRIURETIC PEPTIDE - Abnormal; Notable for the following:    Pro B Natriuretic peptide (BNP) 2838.0 (*)    All other components within normal limits  URINE MICROSCOPIC-ADD ON - Abnormal; Notable for the following:    Bacteria, UA FEW (*)    All other components within normal limits  HEMOGLOBIN A1C - Abnormal; Notable for the following:    Hemoglobin A1C 6.9 (*)    Mean Plasma Glucose 151 (*)    All other components within normal limits  VITAMIN B12 - Abnormal; Notable for the following:    Vitamin B-12 >2000 (*)    All other components within normal limits  CBC - Abnormal; Notable for the following:    WBC 25.3 (*)    RBC 2.28 (*)    Hemoglobin 7.4 (*)    HCT 22.9 (*)    MCV 100.4 (*)    RDW 17.0 (*)    All other components within normal limits    CREATININE, SERUM - Abnormal; Notable for the following:    Creatinine, Ser 1.15 (*)    GFR calc non Af Amer 42 (*)    GFR calc Af Amer 48 (*)    All other components within normal limits  BASIC METABOLIC PANEL - Abnormal; Notable for the following:    Glucose, Bld 156 (*)    BUN 27 (*)    Creatinine, Ser 1.14 (*)    GFR calc non Af Amer 42 (*)    GFR calc Af Amer 49 (*)    All other components within normal limits  CBC - Abnormal; Notable for the following:    WBC 20.1 (*)    RBC 2.06 (*)    Hemoglobin 6.5 (*)    HCT 20.7 (*)    MCV 100.5 (*)    RDW 17.2 (*)    All other components within normal limits  GLUCOSE, CAPILLARY - Abnormal; Notable for the following:    Glucose-Capillary 142 (*)    All other components within normal limits  GLUCOSE, CAPILLARY - Abnormal; Notable for the following:    Glucose-Capillary 148 (*)    All other components within normal limits  GLUCOSE, CAPILLARY - Abnormal; Notable for the following:    Glucose-Capillary 216 (*)    All other components within normal limits  URINE CULTURE  URINE CULTURE  CK  TROPONIN I  TROPONIN I  TROPONIN I  TROPONIN I  TSH  OCCULT BLOOD X 1 CARD TO LAB, STOOL  SODIUM, URINE, RANDOM  CREATININE, URINE, RANDOM  POCT I-STAT TROPONIN I  OCCULT BLOOD, POC DEVICE  TYPE AND SCREEN  PREPARE RBC (CROSSMATCH)   Imaging Review Dg Chest 1 View  03/18/2013   CLINICAL DATA:  Fall today. Chest and left shoulder pain.  EXAM: CHEST - 1 VIEW  COMPARISON:  03/16/2013 and 01/19/2013 radiographs.  FINDINGS: Left subclavian pacemaker leads appear unchanged within the right atrium and right ventricle.  The heart size is normal. The aorta is diffusely tortuous. The lungs are clear. There is no pleural effusion or pneumothorax.  The bones are diffusely demineralized. The subacromial space of both shoulders is obliterated consistent with chronic rotator cuff tears. No acute osseous findings are identified.  IMPRESSION: No acute  cardiopulmonary process demonstrated. Chronic bilateral rotator cuff tears.   Electronically Signed   By: Roxy Horseman M.D.   On: 03/18/2013 16:35   Ct Head Wo Contrast  03/18/2013   CLINICAL DATA:  Fall. Dizziness with hypertension. Recent chemotherapy for bladder cancer. History of diabetes.  EXAM: CT HEAD WITHOUT CONTRAST  CT CERVICAL SPINE WITHOUT CONTRAST  TECHNIQUE: Multidetector CT imaging of the head and cervical spine was performed following the standard protocol without intravenous contrast. Multiplanar CT image reconstructions of the cervical spine were also generated.  COMPARISON:  Prior examinations 01/19/2013.  FINDINGS: CT HEAD FINDINGS  There is stable mild atrophy with confluent periventricular low density consistent with chronic small vessel ischemic change. No cortically based infarct is identified. There is no evidence of acute intracranial hemorrhage, mass lesion, brain edema or extra-axial fluid collection.  The visualized paranasal sinuses, mastoid air cells and middle ears are clear. There is stable calvarial demineralization and hyperostosis. No fractures or focal lesions identified.  CT CERVICAL SPINE FINDINGS  There is no evidence of acute cervical spine fracture, traumatic subluxation or static sign of instability. There is a mild superior endplate compression deformity at T1 which appears unchanged. There is also a chronic compression deformity at T4, unchanged for chest CT performed in July.  There is multilevel spondylosis with disc space loss and uncinate spurring, greatest from C4-5 through C6-7. No acute soft tissue findings are evident. There are diffuse vascular calcifications. There is an aberrant right subclavian artery.  IMPRESSION: 1. No acute intracranial are calvarial findings. 2. No evidence of acute cervical spine fracture, traumatic subluxation or static signs of instability. Compression deformities at T1 and T4 appear unchanged. 3. Stable chronic periventricular white  matter disease, atherosclerosis and cervical spondylosis.   Electronically Signed   By: Roxy Horseman M.D.   On: 03/18/2013 16:11   Ct Cervical Spine Wo Contrast  03/18/2013   CLINICAL DATA:  Fall. Dizziness with hypertension. Recent chemotherapy for bladder cancer. History of diabetes.  EXAM: CT HEAD WITHOUT CONTRAST  CT CERVICAL SPINE WITHOUT CONTRAST  TECHNIQUE: Multidetector CT imaging of the head and cervical spine was performed following the standard protocol without intravenous contrast. Multiplanar CT image reconstructions of the cervical spine were also generated.  COMPARISON:  Prior examinations 01/19/2013.  FINDINGS: CT HEAD FINDINGS  There is stable mild atrophy with confluent periventricular low density consistent with chronic small vessel ischemic change. No cortically based infarct is identified. There is no evidence of acute intracranial hemorrhage, mass lesion, brain edema or extra-axial fluid collection.  The visualized paranasal sinuses, mastoid air cells and middle ears are clear. There is stable calvarial demineralization and hyperostosis. No fractures or focal lesions identified.  CT CERVICAL SPINE FINDINGS  There is no evidence of acute cervical spine fracture, traumatic subluxation or static sign of instability. There is a mild superior endplate compression deformity at T1 which appears unchanged. There is also a chronic compression deformity at T4, unchanged for chest CT performed in July.  There is multilevel spondylosis with disc space loss and uncinate spurring, greatest from C4-5 through C6-7. No acute soft tissue findings are evident. There are diffuse vascular calcifications. There is an aberrant right subclavian  artery.  IMPRESSION: 1. No acute intracranial are calvarial findings. 2. No evidence of acute cervical spine fracture, traumatic subluxation or static signs of instability. Compression deformities at T1 and T4 appear unchanged. 3. Stable chronic periventricular white matter  disease, atherosclerosis and cervical spondylosis.   Electronically Signed   By: Roxy Horseman M.D.   On: 03/18/2013 16:11   Dg Shoulder Left  03/18/2013   CLINICAL DATA:  Initial encounter for left shoulder injury due to a fall earlier today.  EXAM: LEFT SHOULDER - 2+ VIEW  COMPARISON:  03/16/2013, 10/23/2010.  FINDINGS: No evidence of acute fracture or glenohumeral dislocation. Acromioclavicular joint intact with degenerative changes. Marked narrowing of the subacromial space, not significantly changed since 2012. Generalized osseous demineralization.  IMPRESSION: 1. No acute osseous abnormality. 2. Stable marked narrowing of the subacromial space consistent with chronic supraspinatus tendon disease. 3. Stable moderate degenerative changes involving the AC joint. 4. Generalized osseous demineralization.   Electronically Signed   By: Hulan Saas M.D.   On: 03/18/2013 16:24    EKG Interpretation     Ventricular Rate:  88 PR Interval:  148 QRS Duration: 132 QT Interval:  440 QTC Calculation: 532 R Axis:   -47 Text Interpretation:  Atrial-sensed ventricular-paced rhythm No further analysis attempted due to paced rhythm            MDM   1. Fall at home, initial encounter   2. Anemia   3. Leukocytosis   4. Hypokalemia   5. Acute renal insufficiency   6. Diabetes mellitus   7. CKD (chronic kidney disease), stage III   8. Falls, initial encounter   9. Leukocytosis, unspecified   10. UTI (urinary tract infection)    Pt is a 79 y.o. female with Pmhx as above who presents with fall.  Pt states she had gotten out of bed, was walking through hall when she went down on the ground. She cannot fully describe why she fell but had told another nurse her legs gave out.  She hit head on wall, fell onto L side.  She complains of mild h/a, L shoulder, L chest wall pain. She had another fall about 5 days ago, was diagnosed w/ 3 L sided rib fractures,  Rotator cuff tear 2 days ago.  On PE, VSS,  pt in NAD. GCS 15.  Denies recent illness, cough, fever, SOB, ab pain, n/v, d/a, dysuria.  +ttp over L chest, L shoulder.  Daughter states she was recently in discussion w/ a Child psychotherapist about her living situation and has decided to try to stay at home.  She had home eval, has paid help during the day, daughter helps evening & weekends. W/U shows no new findings in chest or shoulder. Negative CT head & C-spine. Labs reveal WBC 32.9, Hb 7.7, K 3.0. Trop negative. Urine likely infected. Given continued falls despite home health, multiple lab abnormalities, triad asked to evaluate & will admit to their service under observation.         Shanna Cisco, MD 03/19/13 [phone]

## 2013-03-18 NOTE — ED Notes (Signed)
Pt's daughter had to leave for a couple hours. Contact Number: [phone]

## 2013-03-18 NOTE — ED Notes (Addendum)
Presents from home with recent fall Monday and was seen at [facility] DX with broken ribs and left torn rotator cuff, fell at home again today, denies LOC, fell on carpet using walker c/o neck pain, hematoma to head. Alert and oriented. Takes plavix, hx of multiple falls, lives at home alone

## 2013-03-18 NOTE — ED Notes (Signed)
Patient transported to X-ray / CT scan. 

## 2013-03-18 NOTE — ED Notes (Signed)
Admitting MD at bedside.

## 2013-03-18 NOTE — ED Notes (Signed)
Reports fell while using a walker. C/o posterior head, left ribn pain. Denies LOC.  States increased pain to left rib area especially with rib fractures from previous fall Monday.  States unable to use walker due to pain in ribs & shoulder. States she lives alone & family drops by frequently & also has a Comptroller that comes daily for a few hours daily.

## 2013-03-19 ENCOUNTER — Encounter (HOSPITAL_COMMUNITY): Payer: Self-pay | Admitting: *Deleted

## 2013-03-19 ENCOUNTER — Encounter: Payer: Self-pay | Admitting: Family Medicine

## 2013-03-19 DIAGNOSIS — N390 Urinary tract infection, site not specified: Secondary | ICD-10-CM | POA: Diagnosis present

## 2013-03-19 DIAGNOSIS — S2249XA Multiple fractures of ribs, unspecified side, initial encounter for closed fracture: Secondary | ICD-10-CM

## 2013-03-19 HISTORY — DX: Multiple fractures of ribs, unspecified side, initial encounter for closed fracture: S22.49XA

## 2013-03-19 LAB — CREATININE, URINE, RANDOM: Creatinine, Urine: 108.47 mg/dL

## 2013-03-19 LAB — GLUCOSE, CAPILLARY
Glucose-Capillary: 148 mg/dL — ABNORMAL HIGH (ref 70–99)
Glucose-Capillary: 216 mg/dL — ABNORMAL HIGH (ref 70–99)
Glucose-Capillary: 147 mg/dL — ABNORMAL HIGH (ref 70–99)

## 2013-03-19 LAB — SODIUM, URINE, RANDOM: Sodium, Ur: 14 mEq/L

## 2013-03-19 LAB — CBC
HCT: 20.7 % — ABNORMAL LOW (ref 36.0–46.0)
Hemoglobin: 6.5 g/dL — CL (ref 12.0–15.0)
MCHC: 31.4 g/dL (ref 30.0–36.0)
MCV: 100.5 fL — ABNORMAL HIGH (ref 78.0–100.0)
Platelets: 152 10*3/uL (ref 150–400)
RBC: 2.06 MIL/uL — ABNORMAL LOW (ref 3.87–5.11)
WBC: 20.1 10*3/uL — ABNORMAL HIGH (ref 4.0–10.5)

## 2013-03-19 LAB — HEMOGLOBIN A1C
Hgb A1c MFr Bld: 6.9 % — ABNORMAL HIGH (ref <5.7)
Mean Plasma Glucose: 151 mg/dL — ABNORMAL HIGH (ref <117)

## 2013-03-19 LAB — BASIC METABOLIC PANEL
CO2: 30 mEq/L (ref 19–32)
Calcium: 8.5 mg/dL (ref 8.4–10.5)
Chloride: 101 mEq/L (ref 96–112)
Potassium: 4.1 mEq/L (ref 3.5–5.1)
Sodium: 138 mEq/L (ref 135–145)

## 2013-03-19 LAB — TROPONIN I: Troponin I: <0.3 ng/mL (ref <0.30)

## 2013-03-19 LAB — OCCULT BLOOD X 1 CARD TO LAB, STOOL: Fecal Occult Bld: NEGATIVE

## 2013-03-19 MED ORDER — ENSURE COMPLETE PO LIQD
237.0000 mL | Freq: Three times a day (TID) | ORAL | Status: DC
  Administered 2013-03-19 – 2013-03-20 (×4): 237 mL via ORAL

## 2013-03-19 MED ORDER — DEXTROSE 5 % IV SOLN: 1.0000 g | INTRAVENOUS | Status: DC

## 2013-03-19 MED ORDER — CIPROFLOXACIN IN D5W 400 MG/200ML IV SOLN
400.0000 mg | INTRAVENOUS | Status: DC
  Administered 2013-03-19: 400 mg via INTRAVENOUS
  Filled 2013-03-19 (×3): qty 200

## 2013-03-19 MED ORDER — CIPROFLOXACIN IN D5W 400 MG/200ML IV SOLN: 400.0000 mg | Freq: Two times a day (BID) | INTRAVENOUS | Status: DC

## 2013-03-19 NOTE — Progress Notes (Signed)
Thank you for advising me of this patient's admission. Pleasant 79 year old woman who receives Aranesp injections in our office for multifactorial anemia with components of chronic renal insufficiency and underlying myeloproliferative disorder with dysplastic features which is also associated with chronic leukocytosis. She has a peripheral neuropathy. She has been very unsteady on her feet. She denies any alcohol use. She has had a number of falls requiring hospital admission and fell again fracturing some ribs and was readmitted yesterday. Hemoglobin on admission was 7.7 compared with recent value in my office on October 20 at 9.7. Hemoglobin has subsequently fallen to 6.5. White count 33,000 on admission with recent baseline 26,000 and current value 20,000. Platelet count is normal. She is receiving Aranesp 300 mcg subcutaneous every 3 weeks most recent dose given on October 20. Next dose is due on November 10. I have no specific recommendation at this point in time other than to transfuse to a stable hemoglobin.

## 2013-03-19 NOTE — Progress Notes (Addendum)
[facility] PROGRESS NOTE  Assessment/Plan:  Falls/UTI: - At least 5 falls in the last year. X-ray showed no fractures. Neomia Dear and Cr pending. Specific gravity high. - UC pending, PT consulted. - Start rocephin.   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 WBC high ?Marland Kitchen Has remained afebrile. - CBC post transfusion. - Needs Hematology follow up as an outpatient.     Code Status: full code.  Family Communication: none at bedside.  Disposition Plan: admit under observation for further evaluation of weakness, frequent fall.     Consultants:  none  Procedures:  CT head & Cervical  CXR  X-ray shoulder  Antibiotics:  Rocephin 11.3.2014  HPI/Subjective: No complains.  Objective: Filed Vitals:   03/19/13 0505 03/19/13 0510 03/19/13 0635 03/19/13 0703  BP: 111/51  122/58 125/59  Pulse: 74  74 73  Temp:   97.9 F (36.6 C) 97.5 F (36.4 C)  TempSrc:   Oral Oral  Resp:   18 16  Height:      Weight: 40.4 kg (89 lb 1.1 oz) 39.9 kg (87 lb 15.4 oz)    SpO2:   99% 100%    Intake/Output Summary (Last 24 hours) at 03/19/13 0739 Last data filed at 03/19/13 0703  Gross per 24 hour  Intake  362.5 ml  Output    275 ml  Net   87.5 ml   Filed Weights   03/18/13 2027 03/19/13 0505 03/19/13 0510  Weight: 39.9 kg (87 lb 15.4 oz) 40.4 kg (89 lb 1.1 oz) 39.9 kg (87 lb 15.4 oz)    Exam:  General: Alert, awake, oriented x3, in no acute distress.  HEENT: No bruits, no goiter.  Heart: Regular rate and rhythm, without murmurs, rubs, gallops.  Lungs: Good air movement, bilateral air movement.  Abdomen: Soft, nontender, nondistended, positive bowel sounds.  Neuro: Grossly intact, nonfocal.   Data Reviewed: Basic Metabolic Panel:  Recent Labs Lab 03/18/13 1510 03/18/13 2100 03/19/13 0208  NA 138  --  138  K 3.0*  --  4.1  CL 94*  --  101  CO2 31  --  30  GLUCOSE 236*  --  156*  BUN 27*  --  27*  CREATININE 1.07  1.15* 1.14*  CALCIUM 9.2  --  8.5   Liver Function Tests: No results found for this basename: AST, ALT, ALKPHOS, BILITOT, PROT, ALBUMIN,  in the last 168 hours No results found for this basename: LIPASE, AMYLASE,  in the last 168 hours No results found for this basename: AMMONIA,  in the last 168 hours CBC:  Recent Labs Lab 03/18/13 1510 03/18/13 2100 03/19/13 0208  WBC 32.9* 25.3* 20.1*  NEUTROABS 30.6*  --   --   HGB 7.7* 7.4* 6.5*  HCT 23.8* 22.9* 20.7*  MCV 101.3* 100.4* 100.5*  PLT 173 155 152   Cardiac Enzymes:  Recent Labs Lab 03/18/13 1510 03/18/13 1712 03/18/13 2100 03/19/13 0201  CKTOTAL 88  --   --   --   TROPONINI  --  <0.30 <0.30 <0.30   BNP (last 3 results)  Recent Labs  04/20/12 1226 12/03/12 0825 03/18/13 1510  PROBNP 113.0* 2120.0* 2838.0*   CBG:  Recent Labs Lab 03/18/13 2212 03/19/13 0605  GLUCAP 142* 148*    No results found for this or any previous visit (from the past 240 hour(s)).   Studies: Dg Chest 1 View  03/18/2013   CLINICAL DATA:  Fall today. Chest and left shoulder pain.  EXAM: CHEST - 1 VIEW  COMPARISON:  03/16/2013 and 01/19/2013 radiographs.  FINDINGS: Left subclavian pacemaker leads appear unchanged within the right atrium and right ventricle. The heart size is normal. The aorta is diffusely tortuous. The lungs are clear. There is no pleural effusion or pneumothorax.  The bones are diffusely demineralized. The subacromial space of both shoulders is obliterated consistent with chronic rotator cuff tears. No acute osseous findings are identified.  IMPRESSION: No acute cardiopulmonary process demonstrated. Chronic bilateral rotator cuff tears.   Electronically Signed   By: Roxy Horseman M.D.   On: 03/18/2013 16:35   Ct Head Wo Contrast  03/18/2013   CLINICAL DATA:  Fall. Dizziness with hypertension. Recent chemotherapy for bladder cancer. History of diabetes.  EXAM: CT HEAD WITHOUT CONTRAST  CT CERVICAL SPINE WITHOUT CONTRAST   TECHNIQUE: Multidetector CT imaging of the head and cervical spine was performed following the standard protocol without intravenous contrast. Multiplanar CT image reconstructions of the cervical spine were also generated.  COMPARISON:  Prior examinations 01/19/2013.  FINDINGS: CT HEAD FINDINGS  There is stable mild atrophy with confluent periventricular low density consistent with chronic small vessel ischemic change. No cortically based infarct is identified. There is no evidence of acute intracranial hemorrhage, mass lesion, brain edema or extra-axial fluid collection.  The visualized paranasal sinuses, mastoid air cells and middle ears are clear. There is stable calvarial demineralization and hyperostosis. No fractures or focal lesions identified.  CT CERVICAL SPINE FINDINGS  There is no evidence of acute cervical spine fracture, traumatic subluxation or static sign of instability. There is a mild superior endplate compression deformity at T1 which appears unchanged. There is also a chronic compression deformity at T4, unchanged for chest CT performed in July.  There is multilevel spondylosis with disc space loss and uncinate spurring, greatest from C4-5 through C6-7. No acute soft tissue findings are evident. There are diffuse vascular calcifications. There is an aberrant right subclavian artery.  IMPRESSION: 1. No acute intracranial are calvarial findings. 2. No evidence of acute cervical spine fracture, traumatic subluxation or static signs of instability. Compression deformities at T1 and T4 appear unchanged. 3. Stable chronic periventricular white matter disease, atherosclerosis and cervical spondylosis.   Electronically Signed   By: Roxy Horseman M.D.   On: 03/18/2013 16:11   Ct Cervical Spine Wo Contrast  03/18/2013   CLINICAL DATA:  Fall. Dizziness with hypertension. Recent chemotherapy for bladder cancer. History of diabetes.  EXAM: CT HEAD WITHOUT CONTRAST  CT CERVICAL SPINE WITHOUT CONTRAST   TECHNIQUE: Multidetector CT imaging of the head and cervical spine was performed following the standard protocol without intravenous contrast. Multiplanar CT image reconstructions of the cervical spine were also generated.  COMPARISON:  Prior examinations 01/19/2013.  FINDINGS: CT HEAD FINDINGS  There is stable mild atrophy with confluent periventricular low density consistent with chronic small vessel ischemic change. No cortically based infarct is identified. There is no evidence of acute intracranial hemorrhage, mass lesion, brain edema or extra-axial fluid collection.  The visualized paranasal sinuses, mastoid air cells and middle ears are clear. There is stable calvarial demineralization and hyperostosis. No fractures or focal lesions identified.  CT CERVICAL SPINE FINDINGS  There is no evidence of acute cervical spine fracture, traumatic subluxation or static sign of instability. There is a mild superior endplate compression deformity at T1 which appears unchanged. There is also a chronic compression deformity at T4, unchanged for chest CT performed in July.  There is multilevel spondylosis with disc space  loss and uncinate spurring, greatest from C4-5 through C6-7. No acute soft tissue findings are evident. There are diffuse vascular calcifications. There is an aberrant right subclavian artery.  IMPRESSION: 1. No acute intracranial are calvarial findings. 2. No evidence of acute cervical spine fracture, traumatic subluxation or static signs of instability. Compression deformities at T1 and T4 appear unchanged. 3. Stable chronic periventricular white matter disease, atherosclerosis and cervical spondylosis.   Electronically Signed   By: Roxy Horseman M.D.   On: 03/18/2013 16:11   Dg Shoulder Left  03/18/2013   CLINICAL DATA:  Initial encounter for left shoulder injury due to a fall earlier today.  EXAM: LEFT SHOULDER - 2+ VIEW  COMPARISON:  03/16/2013, 10/23/2010.  FINDINGS: No evidence of acute fracture or  glenohumeral dislocation. Acromioclavicular joint intact with degenerative changes. Marked narrowing of the subacromial space, not significantly changed since 2012. Generalized osseous demineralization.  IMPRESSION: 1. No acute osseous abnormality. 2. Stable marked narrowing of the subacromial space consistent with chronic supraspinatus tendon disease. 3. Stable moderate degenerative changes involving the AC joint. 4. Generalized osseous demineralization.   Electronically Signed   By: Hulan Saas M.D.   On: 03/18/2013 16:24    Scheduled Meds: . aspirin EC  81 mg Oral Daily  . atorvastatin  40 mg Oral q1800  . darifenacin  7.5 mg Oral Daily  . docusate sodium  100 mg Oral BID  . enoxaparin (LOVENOX) injection  20 mg Subcutaneous Q24H  . fosfomycin  3 g Oral Q3 days  . insulin aspart  0-9 Units Subcutaneous TID WC  . lactose free nutrition  237 mL Oral TID BM  . levothyroxine  112 mcg Oral QAC breakfast  . pantoprazole  40 mg Oral Daily  . PARoxetine  10 mg Oral QPM   Continuous Infusions: . sodium chloride 75 mL/hr at 03/18/13 2114     Marinda Elk  [facility] Pager [phone]. If 8PM-8AM, please contact night-coverage at [email or web address], password [facility] 03/19/2013, 7:39 AM  LOS: 1 day

## 2013-03-19 NOTE — Progress Notes (Signed)
INITIAL NUTRITION ASSESSMENT  DOCUMENTATION CODES Per approved criteria  -Severe malnutrition in the context of chronic illness -Underweight   INTERVENTION:  Chocolate Ensure Complete PO TID, each supplement provides 350 kcal and 13 grams of protein.  Continue regular diet.  NUTRITION DIAGNOSIS: Malnutrition related to inadequate oral intake as evidenced by severe depletion of muscle mass and subcutaneous fat mass with 15% weight loss in the past 6 months.   Goal: Intake to meet >90% of estimated nutrition needs.  Monitor:  PO intake, labs, weight trend.  Reason for Assessment: MST=2  79 y.o. female  Admitting Dx: Frequent falls, weakness  ASSESSMENT: Patient presented to the ED on 11/2 S/P another fall while she was trying to stand up. Patient sustained a fall last week, she was seen by her PCP and found to have 3 rib fractures. Patient has fallen at least 5 times in the past year. Current x-ray showed no fractures. Evaluation for weakness and frequent falls continues.  Patient reports poor intake due to "no appetite." Drinks PO supplements at home. Likes chocolate flavor. Has lost a lot of weight over the past 6 months.  Nutrition Focused Physical Exam:  Subcutaneous Fat:  Orbital Region: mild-moderate depletion Upper Arm Region: severe depletion Thoracic and Lumbar Region: mild-moderate depletion  Muscle:  Temple Region: mild-moderate depletion Clavicle Bone Region: severe depletion Clavicle and Acromion Bone Region: severe depletion Scapular Bone Region: mild-moderate depletion Dorsal Hand: severe depletion Patellar Region: severe depletion Anterior Thigh Region: severe depletion Posterior Calf Region: severe depletion  Edema: none   Pt meets criteria for severe MALNUTRITION in the context of chronic illness as evidenced by severe depletion of muscle mass and subcutaneous fat mass with 15% weight loss in the past 6 months.   Height: Ht Readings from Last 1  Encounters:  03/18/13 5' (1.524 m)    Weight: Wt Readings from Last 1 Encounters:  03/19/13 87 lb 15.4 oz (39.9 kg)    Ideal Body Weight: 45.5 kg  % Ideal Body Weight: 88%  Wt Readings from Last 10 Encounters:  03/19/13 87 lb 15.4 oz (39.9 kg)  03/16/13 87 lb 0.6 oz (39.481 kg)  01/20/13 95 lb (43.092 kg)  12/18/12 97 lb 8 oz (44.226 kg)  12/06/12 98 lb 5.2 oz (44.6 kg)  11/21/12 96 lb (43.545 kg)  11/07/12 97 lb 6.4 oz (44.18 kg)  10/30/12 97 lb 9.6 oz (44.271 kg)  10/20/12 102 lb 12.8 oz (46.63 kg)  09/13/12 102 lb 6.4 oz (46.448 kg)    Usual Body Weight: 102 lb (6 months ago)  % Usual Body Weight: 85%  BMI:  Body mass index is 17.18 kg/(m^2). underweight  Estimated Nutritional Needs: Kcal: 1200-1400 Protein: 70-80 gm Fluid: 1.2-1.4 L  Skin: stage II sacral pressure ulcer  Diet Order: General  EDUCATION NEEDS: -Education needs addressed-discussed ways to maximize protein and calorie intake.   Intake/Output Summary (Last 24 hours) at 03/19/13 0851 Last data filed at 03/19/13 0844  Gross per 24 hour  Intake 1238.75 ml  Output    275 ml  Net 963.75 ml    Last BM: 11/2   Labs:   Recent Labs Lab 03/18/13 1510 03/18/13 2100 03/19/13 0208  NA 138  --  138  K 3.0*  --  4.1  CL 94*  --  101  CO2 31  --  30  BUN 27*  --  27*  CREATININE 1.07 1.15* 1.14*  CALCIUM 9.2  --  8.5  GLUCOSE 236*  --  156*    CBG (last 3)   Recent Labs  03/18/13 2212 03/19/13 0605  GLUCAP 142* 148*    Scheduled Meds: . aspirin EC  81 mg Oral Daily  . atorvastatin  40 mg Oral q1800  . ciprofloxacin  400 mg Intravenous Q24H  . darifenacin  7.5 mg Oral Daily  . docusate sodium  100 mg Oral BID  . enoxaparin (LOVENOX) injection  20 mg Subcutaneous Q24H  . fosfomycin  3 g Oral Q3 days  . insulin aspart  0-9 Units Subcutaneous TID WC  . lactose free nutrition  237 mL Oral TID BM  . levothyroxine  112 mcg Oral QAC breakfast  . pantoprazole  40 mg Oral Daily  .  PARoxetine  10 mg Oral QPM    Continuous Infusions: . sodium chloride 75 mL/hr at 03/18/13 2114    Past Medical History  Diagnosis Date  . CAD (coronary artery disease)     a. 10/2007 PCI Diag w/ 2.25 x 8mm Taxus Atom DES.;  b. 09/2011 Lexi MV: EF 69%, no ischemia/infarct.  Marland Kitchen PVD (peripheral vascular disease)   . Carotid artery stenosis     Dr. Graciela Husbands  . HTN (hypertension)   . Hyperlipidemia   . Hypothyroidism   . CKD (chronic kidney disease), stage III   . Diabetes mellitus 07/16/2011  . Allergic state 07/16/2011  . Insomnia 07/16/2011  . Recurrent UTI 07/16/2011  . Asthma     childhood, recurrent at 43  . Myelodysplastic syndrome 04/02/2011  . Sinusitis 07/18/2011  . Anemia 07/18/2011  . Arthritis 07/18/2011  . Cataract extraction status of right eye 08/18/2011  . Anxiety and depression 08/18/2011  . Bronchitis, acute 12/20/2011  . Headache(784.0) 03/02/2012  . AV heart block     a. 10/2007 MDT Versa dual chamber PPM.  . Bladder cancer 12/2002  . Closed intertrochanteric fracture of left hip 05/27/2012  . Loss of weight 09/04/2012  . Renal failure, chronic 09/05/2012  . Conjunctivitis unspecified 11/21/2012  . Frequent falls   . Fracture three ribs-closed 03/19/2013    Past Surgical History  Procedure Laterality Date  . Cholecystectomy    . Pcm - medtronic    . Abdominal hysterectomy    . Shoulder open rotator cuff repair    . Foot surgery      bunionectomy  . Intramedullary (im) nail intertrochanteric  05/27/2012    Procedure: INTRAMEDULLARY (IM) NAIL INTERTROCHANTRIC;  Surgeon: Eulas Post, MD;  Location: [facility] OR;  Service: Orthopedics;  Laterality: Left;  . Pacemaker insertion      Joaquin Courts, RD, LDN, CNSC Pager [phone] After Hours Pager [phone]

## 2013-03-19 NOTE — Assessment & Plan Note (Signed)
Secondary to fall in home a couple days ago. Encouraged rest and pain meds sparingly

## 2013-03-19 NOTE — Progress Notes (Signed)
Utilization Review Completed.[name] T11/07/2012  

## 2013-03-19 NOTE — Assessment & Plan Note (Signed)
Sounds as if she may be having episodes of low sugar, encouraged to eat a protein every 3 hours and to improve hydration. Does not need to worry about avoiding certain foods, mostly she needs to eat regularly and always eat a protein

## 2013-03-19 NOTE — Progress Notes (Signed)
Patient ID: [name], female   DOB: 1923-11-02, 79 y.o.   MRN: [id number] [name] [id number] 1923/12/25 03/19/2013      Progress Note-Follow Up  Subjective  Chief Complaint  Chief Complaint  Patient presents with  . Fall    pt keeps falling  . Injections    Prevnar and flu-high dose    HPI  Patient is an 69 we'll Caucasian female who is in today with complete fruit.. She fell at home earlier in the week and struck her left chest wall. It's been painful every since. No shortness of breath or palpitations. She technologist eating poorly and skipping meals. Has episodes of feeling lightheaded and weak when she doesn't eat for a wound. Some nausea and diaphoresis and also occur. None of the symptoms are present now. No GI or GU complaints.  Past Medical History  Diagnosis Date  . CAD (coronary artery disease)     a. 10/2007 PCI Diag w/ 2.25 x 8mm Taxus Atom DES.;  b. 09/2011 Lexi MV: EF 69%, no ischemia/infarct.  Marland Kitchen PVD (peripheral vascular disease)   . Carotid artery stenosis     Dr. Graciela Husbands  . HTN (hypertension)   . Hyperlipidemia   . Hypothyroidism   . CKD (chronic kidney disease), stage III   . Diabetes mellitus 07/16/2011  . Allergic state 07/16/2011  . Insomnia 07/16/2011  . Recurrent UTI 07/16/2011  . Asthma     childhood, recurrent at 39  . Myelodysplastic syndrome 04/02/2011  . Sinusitis 07/18/2011  . Anemia 07/18/2011  . Arthritis 07/18/2011  . Cataract extraction status of right eye 08/18/2011  . Anxiety and depression 08/18/2011  . Bronchitis, acute 12/20/2011  . Headache(784.0) 03/02/2012  . AV heart block     a. 10/2007 MDT Versa dual chamber PPM.  . Bladder cancer 12/2002  . Closed intertrochanteric fracture of left hip 05/27/2012  . Loss of weight 09/04/2012  . Renal failure, chronic 09/05/2012  . Conjunctivitis unspecified 11/21/2012  . Frequent falls   . Fracture three ribs-closed 03/19/2013    Past Surgical History  Procedure Laterality Date  . Cholecystectomy     . Pcm - medtronic    . Abdominal hysterectomy    . Shoulder open rotator cuff repair    . Foot surgery      bunionectomy  . Intramedullary (im) nail intertrochanteric  05/27/2012    Procedure: INTRAMEDULLARY (IM) NAIL INTERTROCHANTRIC;  Surgeon: Eulas Post, MD;  Location: [facility] OR;  Service: Orthopedics;  Laterality: Left;  . Pacemaker insertion      Family History  Problem Relation Age of Onset  . Heart disease Mother     chf  . Arthritis Mother   . Arthritis Father   . Kidney disease Father   . Hypertension Father   . Cancer Brother     lung  . Arthritis Daughter     MVA with very serious injuries    History   Social History  . Marital Status: Widowed    Spouse Name: N/A    Number of Children: N/A  . Years of Education: N/A   Occupational History  . Not on file.   Social History Main Topics  . Smoking status: Never Smoker   . Smokeless tobacco: Never Used  . Alcohol Use: No  . Drug Use: No  . Sexual Activity: No   Other Topics Concern  . Not on file   Social History Narrative   Lives at home and uses a walker  and has caretaker who comes almost every day.      No current facility-administered medications on file prior to visit.   Current Outpatient Prescriptions on File Prior to Visit  Medication Sig Dispense Refill  . aspirin EC 81 MG tablet Take 81 mg by mouth daily.      Marland Kitchen darifenacin (ENABLEX) 7.5 MG 24 hr tablet Take 7.5 mg by mouth daily.      Marland Kitchen lactose free nutrition (BOOST) LIQD Take 237 mLs by mouth 3 (three) times daily between meals.      Marland Kitchen levothyroxine (SYNTHROID, LEVOTHROID) 112 MCG tablet Take 1 tablet (112 mcg total) by mouth daily.  30 tablet  1  . rosuvastatin (CRESTOR) 20 MG tablet Take 1 tablet (20 mg total) by mouth at bedtime.  30 tablet  1  . vitamin B-12 (CYANOCOBALAMIN) 500 MCG tablet Take 500 mcg by mouth daily.        Allergies  Allergen Reactions  . Codeine Anaphylaxis and Swelling    Tolerates morphine and dilaudid.  Marland Kitchen  Penicillins Anaphylaxis and Swelling  . Sulfonamide Derivatives Anaphylaxis and Swelling    Review of Systems  Review of Systems  Constitutional: Positive for malaise/fatigue. Negative for fever.  HENT: Negative for congestion.   Eyes: Negative for discharge.  Respiratory: Negative for shortness of breath.   Cardiovascular: Negative for chest pain, palpitations and leg swelling.  Gastrointestinal: Negative for nausea, abdominal pain and diarrhea.  Genitourinary: Negative for dysuria.  Musculoskeletal: Positive for falls.  Skin: Negative for rash.  Neurological: Positive for weakness. Negative for loss of consciousness and headaches.  Endo/Heme/Allergies: Negative for polydipsia.  Psychiatric/Behavioral: Negative for depression and suicidal ideas. The patient is not nervous/anxious and does not have insomnia.     Objective  BP 102/70  Pulse 88  Temp(Src) 98 F (36.7 C) (Oral)  Ht 5' (1.524 m)  Wt 87 lb 0.6 oz (39.481 kg)  BMI 17.00 kg/m2  SpO2 99%  Physical Exam  Physical Exam  Constitutional: She is oriented to person, place, and time and well-developed, well-nourished, and in no distress. No distress.  HENT:  Head: Normocephalic and atraumatic.  Eyes: Conjunctivae are normal.  Neck: Neck supple. No thyromegaly present.  Cardiovascular: Normal rate, regular rhythm and normal heart sounds.   No murmur heard. Pulmonary/Chest: Effort normal and breath sounds normal. She has no wheezes.  Abdominal: She exhibits no distension and no mass.  Musculoskeletal: She exhibits no edema.  Lymphadenopathy:    She has no cervical adenopathy.  Neurological: She is alert and oriented to person, place, and time.  Skin: Skin is warm and dry. No rash noted. She is not diaphoretic.  Ecchymosis left cw midaixllary line tender to palp  Psychiatric: Memory, affect and judgment normal.    Lab Results  Component Value Date   TSH 0.557 01/20/2013   Lab Results  Component Value Date   WBC  25.3* 03/18/2013   HGB 7.4* 03/18/2013   HCT 22.9* 03/18/2013   MCV 100.4* 03/18/2013   PLT 155 03/18/2013   Lab Results  Component Value Date   CREATININE 1.15* 03/18/2013   BUN 27* 03/18/2013   NA 138 03/18/2013   K 3.0* 03/18/2013   CL 94* 03/18/2013   CO2 31 03/18/2013   Lab Results  Component Value Date   ALT 24 12/03/2012   AST 31 12/03/2012   ALKPHOS 58 12/03/2012   BILITOT 0.4 12/03/2012   Lab Results  Component Value Date   CHOL 94 12/03/2012  Lab Results  Component Value Date   HDL 32* 12/03/2012   Lab Results  Component Value Date   LDLCALC 23 12/03/2012   Lab Results  Component Value Date   TRIG 194* 12/03/2012   Lab Results  Component Value Date   CHOLHDL 2.9 12/03/2012     Assessment & Plan  HYPERTENSION, UNSPECIFIED Well controlled no changes  Diabetes mellitus Sounds as if she may be having episodes of low sugar, encouraged to eat a protein every 3 hours and to improve hydration. Does not need to worry about avoiding certain foods, mostly she needs to eat regularly and always eat a protein  Fracture three ribs-closed Secondary to fall in home a couple days ago. Encouraged rest and pain meds sparingly

## 2013-03-19 NOTE — Evaluation (Signed)
Physical Therapy Evaluation Patient Details Name: [name] MRN: [id number] DOB: 26-Oct-1923 Today's Date: 03/19/2013 Time: 5638-7564 PT Time Calculation (min): 25 min  PT Assessment / Plan / Recommendation History of Present Illness  Pt was admitted after fall with another recent fall a week ago with left rib fx. Pt with myelodysplastic syndrome.  At baseline, she lives alone with [facility] Aides. Pt endorses at least 15 falls this year  Clinical Impression  Pt very pleasant and feeling weak, receiving blood and aware of risks of her many frequent falls. Discussed discharge options with pt and need for 24hr care. Pt hesitant to commit to SNF or 24hr care but does state understanding of potential fatal risk of her falls. Pt with decreased balance, strength and function and will benefit from acute therapy to maximize mobility, balance and function to decrease burden of care.     PT Assessment  Patient needs continued PT services    Follow Up Recommendations  SNF;Supervision/Assistance - 24 hour    Does the patient have the potential to tolerate intense rehabilitation      Barriers to Discharge Decreased caregiver support      Equipment Recommendations  None recommended by PT    Recommendations for Other Services     Frequency Min 3X/week    Precautions / Restrictions Precautions Precautions: Fall   Pertinent Vitals/Pain 4/10 left ribs VSS     Mobility  Bed Mobility Bed Mobility: Supine to Sit Supine to Sit: 3: Mod assist;HOB elevated;With rails Details for Bed Mobility Assistance: cueing for sequence with assist to pivot to EOB and elevate trunk Transfers Transfers: Sit to Stand;Stand to Sit Sit to Stand: 4: Min assist;From bed Stand to Sit: 4: Min assist;To chair/3-in-1;With armrests Details for Transfer Assistance: cueing for hand placement and safety with assist for anterior translation and elevation Ambulation/Gait Ambulation/Gait Assistance: 4: Min assist Ambulation  Distance (Feet): 20 Feet Assistive device: Rolling walker Ambulation/Gait Assistance Details: cues to step into RW, extend trunk and assist for balance and directing RW Gait Pattern: Step-through pattern;Decreased stride length;Narrow base of support Gait velocity: decreased Stairs: No    Exercises     PT Diagnosis: Difficulty walking;Acute pain  PT Problem List: Decreased strength;Decreased cognition;Decreased activity tolerance;Decreased balance;Decreased mobility;Decreased coordination;Decreased knowledge of use of DME;Decreased knowledge of precautions PT Treatment Interventions: Gait training;DME instruction;Functional mobility training;Stair training;Therapeutic activities;Therapeutic exercise;Patient/family education;Balance training     PT Goals(Current goals can be found in the care plan section) Acute Rehab PT Goals Patient Stated Goal: be able to go home PT Goal Formulation: With patient Time For Goal Achievement: 04/02/13 Potential to Achieve Goals: Fair  Visit Information  Last PT Received On: 03/19/13 Assistance Needed: +1 History of Present Illness: Pt was admitted after fall with another recent fall a week ago with left rib fx. Pt with myelodysplastic syndrome.  At baseline, she lives alone with [facility]ides. Pt endorses at least 15 falls this year       Prior Functioning  Home Living Family/patient expects to be discharged to:: Private residence Living Arrangements: Alone Available Help at Discharge: Personal care attendant;Family;Available PRN/intermittently Type of Home: House Home Access: Stairs to enter [employer] of Steps: 1 Entrance Stairs-Rails: Left;Right Home Layout: One level Home Equipment: Walker - 2 wheels;Bedside commode;Tub bench;Cane - single point Additional Comments: aide grossly 6 hrs/day with family to provide dinner and assists a few hrs at night but pt with frequent falls when unassisted and does not have 24hr care Prior  Function Level  of Independence: Needs assistance Gait / Transfers Assistance Needed: supervision with gait with RW ADL's / Homemaking Assistance Needed: aide assists with bathing, dressing, housework and meals Comments: pt does not drive or leave the house without assist Communication Communication: No difficulties    Cognition  Cognition Arousal/Alertness: Awake/alert Behavior During Therapy: WFL for tasks assessed/performed Overall Cognitive Status: Impaired/Different from baseline Area of Impairment: Safety/judgement Safety/Judgement: Decreased awareness of deficits;Decreased awareness of safety    Extremity/Trunk Assessment Upper Extremity Assessment Upper Extremity Assessment: Generalized weakness Lower Extremity Assessment Lower Extremity Assessment: Generalized weakness Cervical / Trunk Assessment Cervical / Trunk Assessment: Kyphotic   Balance    End of Session PT - End of Session Equipment Utilized During Treatment: Gait belt Activity Tolerance: Patient limited by fatigue Patient left: in chair;with call bell/phone within reach Nurse Communication: Mobility status  GP     Delorse Lek 03/19/2013, 10:23 AM  Delaney Meigs, PT [phone]

## 2013-03-19 NOTE — Progress Notes (Signed)
Orthostatic WJ:XBJYN 106/52, HR92;  Sitting 111/51, HR 74; pt was not able to stand for the other portion due to the rib fx and torn rotator cuff, will continue to monitor, Thanks Lavonda Jumbo RN

## 2013-03-19 NOTE — Assessment & Plan Note (Signed)
Well controlled no changes 

## 2013-03-20 ENCOUNTER — Telehealth: Payer: Self-pay | Admitting: Family Medicine

## 2013-03-20 LAB — TYPE AND SCREEN: Unit division: 0

## 2013-03-20 LAB — CBC
HCT: 30.1 % — ABNORMAL LOW (ref 36.0–46.0)
Hemoglobin: 10.3 g/dL — ABNORMAL LOW (ref 12.0–15.0)
MCH: 30.7 pg (ref 26.0–34.0)
MCV: 89.9 fL (ref 78.0–100.0)
Platelets: 112 10*3/uL — ABNORMAL LOW (ref 150–400)
RBC: 3.35 MIL/uL — ABNORMAL LOW (ref 3.87–5.11)
WBC: 23.3 10*3/uL — ABNORMAL HIGH (ref 4.0–10.5)

## 2013-03-20 LAB — URINE CULTURE: Colony Count: >=100000

## 2013-03-20 MED ORDER — FOSFOMYCIN TROMETHAMINE 3 G PO PACK: 3.0000 g | PACK | ORAL | Status: AC

## 2013-03-20 NOTE — Progress Notes (Signed)
Ambulance drivers have arrived to hospital to take patient to [name]. Patient on phone with daughter, Dennie Bible, and made aware that she was about to head to facility. Patient taken out of hospital via stretcher.

## 2013-03-20 NOTE — Clinical Social Work Note (Signed)
Clinical Social Work Department BRIEF PSYCHOSOCIAL ASSESSMENT 03/20/2013  Patient:  [name], [name]     Account Number:  [id number]     Admit date:  03/18/2013  Clinical Social Worker:  Verl Blalock  Date/Time:  03/19/2013 04:55 PM  Referred by:  Care Management  Date Referred:  03/19/2013 Referred for  SNF Placement   Other Referral:   Interview type:  Patient Other interview type:   Patient granddaughter at bedside and son over the phone    PSYCHOSOCIAL DATA Living Status:  ALONE Admitted from facility:   Level of care:   Primary support name:  [name], [name]   [phone] / [phone] Primary support relationship to patient:  CHILD, ADULT Degree of support available:   Strong    CURRENT CONCERNS Current Concerns  Post-Acute Placement   Other Concerns:    SOCIAL WORK ASSESSMENT / PLAN Clinical Social Worker met with patient and patient granddaughter at bedside to offer support and discuss patient needs at discharge.  Patient states that she is in agreement with SNF placement.  Patient granddaughter contacted patient son over the phone for CSW - CSW spoke with patient son over the phone who states that patient has been to [facility] in the past and would be willing to return, but for location purposes would prefer [facility]. CSW to initiate SNF referral to [facility] and [facility].  CSW to submit clinicals for insurance authorization.    Clinical Social Worker to follow up with patient family regarding possible bed offers.  CSW remains available for support and to facilitate patient discharge needs once medically stable.   Assessment/plan status:  Psychosocial Support/Ongoing Assessment of Needs Other assessment/ plan:   Information/referral to community resources:   Clinical Social Worker offered patient family resources, however patient family states that they have been through this process in the past and are familiar.    PATIENT'S/FAMILY'S RESPONSE TO PLAN  OF CARE: Patient alert and oriented x3 sitting up in bed.  Patient with strong local family support who plan to assist patient with discharge plans.  Patient family hopeful for placement at [facility].  Patient family understanding of social work role and appreciative of CSW support and concern.

## 2013-03-20 NOTE — Clinical Social Work Note (Addendum)
Clinical Social Worker facilitated patient discharge including contacting patient family and facility to confirm patient discharge plans.  Clinical information faxed to facility and family agreeable with plan.  [employer] Authorization received for SNF and ambulance by [employer].  CSW arranged ambulance transport via [facility] to [facility].  RN to call report prior to discharge.  Clinical Social Worker will sign off for now as social work intervention is no longer needed. Please consult [name] again if new need arises.  Macario Golds, [place] [phone]

## 2013-03-20 NOTE — Progress Notes (Addendum)
Report called to [facility] facility. Patient to be transported via ambulance.

## 2013-03-20 NOTE — Discharge Summary (Addendum)
Physician Discharge Summary  [name] ZOX:[id number] DOB: 1924-02-01 DOA: 03/18/2013  PCP: Danise Edge, MD  Admit date: 03/18/2013 Discharge date: 03/21/2013  Time spent: 40 minutes  Recommendations for Outpatient Follow-up:  1. Follow up PCP in 2-4 weeks.  Discharge Diagnoses:  Active Problems:   myeloproliferative disorder with dysplastic    NSVT (nonsustained ventricular tachycardia)   Macrocytic anemia   Falls   Protein-calorie malnutrition, severe   Acute renal insufficiency   UTI (urinary tract infection)   Discharge Condition: guarded  Diet recommendation: heart healthy  Filed Weights   03/19/13 0505 03/19/13 0510 03/20/13 0545  Weight: 40.4 kg (89 lb 1.1 oz) 39.9 kg (87 lb 15.4 oz) 40.5 kg (89 lb 4.6 oz)    History of present illness:  79 y.o. female PMH significant for pacemaker for symptomatic Bradycardia, CAD S/P DES to D1 2009, myelodysplastic syndrome, HB baseline 9 to 10, who present to ED after another fall the day of admission. Patient sustain a fall last week, she was seen by her PCP. Patient was found to have 3 ribs fracture. Patient fell today while she was trying to stand up. She feel weakness spell and fall. She felt shaking all over at time of fall. Patient denies chest pain, palpitation prior to episode. She only now that she falls.  She was supously treated for UTI last week.    Hospital Course:  Falls/UTI:  - At least 5 falls in the last year. X-ray showed no fractures.  - Una 14, FENA < 1. Specific gravity high. tyreated with IV fluids. -  PT consulted, rec SNF. - treated fosfomycin.  Macrocytic anemia/ myeloproliferative disorder with dysplastic:  - agree with transfusion.  Has remained afebrile.  - Needs Hematology follow up as an outpatient.   Procedures:  none (i.e. Studies not automatically included, echos, thoracentesis, etc; not x-rays)  Consultations:  none  Discharge Exam: Filed Vitals:   03/20/13 1526  BP: 121/54   Pulse:   Temp:   Resp: 18    General: A&O x3 Cardiovascular: RRR Respiratory: good air movement CTA B/L  Discharge Instructions      Discharge Orders   Future Appointments Provider Department Dept Phone   03/26/2013 1:15 PM Mauri [facility] [phone]   03/26/2013 1:45 PM Rana Snare, NP [place] [facility] [phone]   03/26/2013 2:15 PM [facility] Nurse [place] [facility] [phone]   Future Orders Complete By Expires   Diet - low sodium heart healthy  As directed    Diet - low sodium heart healthy  As directed    Increase activity slowly  As directed    Increase activity slowly  As directed        Medication List         acetaminophen 325 MG tablet  Commonly known as:  TYLENOL  Take 650 mg by mouth every 6 (six) hours as needed for pain.     aspirin EC 81 MG tablet  Take 81 mg by mouth daily.     darifenacin 7.5 MG 24 hr tablet  Commonly known as:  ENABLEX  Take 7.5 mg by mouth daily.     fosfomycin 3 G Pack  Commonly known as:  MONUROL  Take 3 g by mouth every 3 (three) days.     lactose free nutrition Liqd  Take 237 mLs by mouth 3 (three) times daily between meals.     levothyroxine  112 MCG tablet  Commonly known as:  SYNTHROID, LEVOTHROID  Take 1 tablet (112 mcg total) by mouth daily.     pantoprazole 40 MG tablet  Commonly known as:  PROTONIX  Take 40 mg by mouth daily.     PARoxetine 20 MG tablet  Commonly known as:  PAXIL  Take 10 mg by mouth every evening.     rosuvastatin 20 MG tablet  Commonly known as:  CRESTOR  Take 1 tablet (20 mg total) by mouth at bedtime.     vitamin B-12 500 MCG tablet  Commonly known as:  CYANOCOBALAMIN  Take 500 mcg by mouth daily.       Allergies  Allergen Reactions  . Codeine Anaphylaxis and Swelling    Tolerates morphine and dilaudid.  Marland Kitchen Penicillins Anaphylaxis and Swelling  . Sulfonamide Derivatives  Anaphylaxis and Swelling   Follow-up Information   Follow up with Danise Edge, MD On 04/17/2013. (hospital follow up office will call back )    Specialty:  Family Medicine   Contact information:   [address] [place] [address] [phone]        The results of significant diagnostics from this hospitalization (including imaging, microbiology, ancillary and laboratory) are listed below for reference.    Significant Diagnostic Studies: Dg Chest 1 View  03/18/2013   CLINICAL DATA:  Fall today. Chest and left shoulder pain.  EXAM: CHEST - 1 VIEW  COMPARISON:  03/16/2013 and 01/19/2013 radiographs.  FINDINGS: Left subclavian pacemaker leads appear unchanged within the right atrium and right ventricle. The heart size is normal. The aorta is diffusely tortuous. The lungs are clear. There is no pleural effusion or pneumothorax.  The bones are diffusely demineralized. The subacromial space of both shoulders is obliterated consistent with chronic rotator cuff tears. No acute osseous findings are identified.  IMPRESSION: No acute cardiopulmonary process demonstrated. Chronic bilateral rotator cuff tears.   Electronically Signed   By: Roxy Horseman M.D.   On: 03/18/2013 16:35   Dg Ribs Unilateral W/chest Left  03/16/2013   CLINICAL DATA:  Fall, left rib pain  EXAM: LEFT RIBS AND CHEST - 3+ VIEW  COMPARISON:  01/19/2013  FINDINGS: Three views left ribs submitted. Cardiomediastinal silhouette is stable. Dual lead cardiac pacemaker is unchanged in position. There is mild displaced fracture of the left 4th 5th and 6th rib. No diagnostic pneumothorax. Diffuse osteopenia.  IMPRESSION: Mild displaced fracture of the left 4th 5th and 6th rib. No diagnostic pneumothorax.   Electronically Signed   By: Natasha Mead M.D.   On: 03/16/2013 13:40   Ct Head Wo Contrast  03/18/2013   CLINICAL DATA:  Fall. Dizziness with hypertension. Recent chemotherapy for bladder cancer. History of diabetes.  EXAM: CT HEAD  WITHOUT CONTRAST  CT CERVICAL SPINE WITHOUT CONTRAST  TECHNIQUE: Multidetector CT imaging of the head and cervical spine was performed following the standard protocol without intravenous contrast. Multiplanar CT image reconstructions of the cervical spine were also generated.  COMPARISON:  Prior examinations 01/19/2013.  FINDINGS: CT HEAD FINDINGS  There is stable mild atrophy with confluent periventricular low density consistent with chronic small vessel ischemic change. No cortically based infarct is identified. There is no evidence of acute intracranial hemorrhage, mass lesion, brain edema or extra-axial fluid collection.  The visualized paranasal sinuses, mastoid air cells and middle ears are clear. There is stable calvarial demineralization and hyperostosis. No fractures or focal lesions identified.  CT CERVICAL SPINE FINDINGS  There is no evidence  of acute cervical spine fracture, traumatic subluxation or static sign of instability. There is a mild superior endplate compression deformity at T1 which appears unchanged. There is also a chronic compression deformity at T4, unchanged for chest CT performed in July.  There is multilevel spondylosis with disc space loss and uncinate spurring, greatest from C4-5 through C6-7. No acute soft tissue findings are evident. There are diffuse vascular calcifications. There is an aberrant right subclavian artery.  IMPRESSION: 1. No acute intracranial are calvarial findings. 2. No evidence of acute cervical spine fracture, traumatic subluxation or static signs of instability. Compression deformities at T1 and T4 appear unchanged. 3. Stable chronic periventricular white matter disease, atherosclerosis and cervical spondylosis.   Electronically Signed   By: Roxy Horseman M.D.   On: 03/18/2013 16:11   Ct Cervical Spine Wo Contrast  03/18/2013   CLINICAL DATA:  Fall. Dizziness with hypertension. Recent chemotherapy for bladder cancer. History of diabetes.  EXAM: CT HEAD WITHOUT  CONTRAST  CT CERVICAL SPINE WITHOUT CONTRAST  TECHNIQUE: Multidetector CT imaging of the head and cervical spine was performed following the standard protocol without intravenous contrast. Multiplanar CT image reconstructions of the cervical spine were also generated.  COMPARISON:  Prior examinations 01/19/2013.  FINDINGS: CT HEAD FINDINGS  There is stable mild atrophy with confluent periventricular low density consistent with chronic small vessel ischemic change. No cortically based infarct is identified. There is no evidence of acute intracranial hemorrhage, mass lesion, brain edema or extra-axial fluid collection.  The visualized paranasal sinuses, mastoid air cells and middle ears are clear. There is stable calvarial demineralization and hyperostosis. No fractures or focal lesions identified.  CT CERVICAL SPINE FINDINGS  There is no evidence of acute cervical spine fracture, traumatic subluxation or static sign of instability. There is a mild superior endplate compression deformity at T1 which appears unchanged. There is also a chronic compression deformity at T4, unchanged for chest CT performed in July.  There is multilevel spondylosis with disc space loss and uncinate spurring, greatest from C4-5 through C6-7. No acute soft tissue findings are evident. There are diffuse vascular calcifications. There is an aberrant right subclavian artery.  IMPRESSION: 1. No acute intracranial are calvarial findings. 2. No evidence of acute cervical spine fracture, traumatic subluxation or static signs of instability. Compression deformities at T1 and T4 appear unchanged. 3. Stable chronic periventricular white matter disease, atherosclerosis and cervical spondylosis.   Electronically Signed   By: Roxy Horseman M.D.   On: 03/18/2013 16:11   Dg Shoulder Left  03/18/2013   CLINICAL DATA:  Initial encounter for left shoulder injury due to a fall earlier today.  EXAM: LEFT SHOULDER - 2+ VIEW  COMPARISON:  03/16/2013,  10/23/2010.  FINDINGS: No evidence of acute fracture or glenohumeral dislocation. Acromioclavicular joint intact with degenerative changes. Marked narrowing of the subacromial space, not significantly changed since 2012. Generalized osseous demineralization.  IMPRESSION: 1. No acute osseous abnormality. 2. Stable marked narrowing of the subacromial space consistent with chronic supraspinatus tendon disease. 3. Stable moderate degenerative changes involving the AC joint. 4. Generalized osseous demineralization.   Electronically Signed   By: Hulan Saas M.D.   On: 03/18/2013 16:24   Dg Shoulder Left  03/16/2013   CLINICAL DATA:  Shoulder pain after falling 4 days ago.  EXAM: LEFT SHOULDER - 2+ VIEW  COMPARISON:  Shoulder radiographs 10/23/2010.  FINDINGS: The bones are diffusely demineralized. There is no evidence of acute fracture or dislocation. The subacromial space is narrowed consistent  with a chronic rotator cuff tear. Mild glenohumeral and acromioclavicular degenerative changes are noted again noted. The patient has a pacemaker with grossly unchanged leads.  IMPRESSION: No acute osseous findings. Degenerative changes as described with persistent evidence of a chronic rotator cuff tear.   Electronically Signed   By: Roxy Horseman M.D.   On: 03/16/2013 14:12    Microbiology: Recent Results (from the past 240 hour(s))  URINE CULTURE     Status: None   Collection Time    03/18/13  6:10 PM      Result Value Range Status   Specimen Description URINE, RANDOM   Final   Special Requests NONE   Final   Culture  Setup Time     Final   Value: 03/19/2013 01:03     Performed at [facility] Count     Final   Value: >=100,000 COLONIES/ML     Performed at [facility]   Culture     Final   Value: Multiple bacterial morphotypes present, none predominant. Suggest appropriate recollection if clinically indicated.     Performed at [facility]   Report Status 03/20/2013  FINAL   Final     Labs: Basic Metabolic Panel:  Recent Labs Lab 03/18/13 1510 03/18/13 2100 03/19/13 0208  NA 138  --  138  K 3.0*  --  4.1  CL 94*  --  101  CO2 31  --  30  GLUCOSE 236*  --  156*  BUN 27*  --  27*  CREATININE 1.07 1.15* 1.14*  CALCIUM 9.2  --  8.5   Liver Function Tests: No results found for this basename: AST, ALT, ALKPHOS, BILITOT, PROT, ALBUMIN,  in the last 168 hours No results found for this basename: LIPASE, AMYLASE,  in the last 168 hours No results found for this basename: AMMONIA,  in the last 168 hours CBC:  Recent Labs Lab 03/18/13 1510 03/18/13 2100 03/19/13 0208 03/20/13 0830  WBC 32.9* 25.3* 20.1* 23.3*  NEUTROABS 30.6*  --   --   --   HGB 7.7* 7.4* 6.5* 10.3*  HCT 23.8* 22.9* 20.7* 30.1*  MCV 101.3* 100.4* 100.5* 89.9  PLT 173 155 152 112*   Cardiac Enzymes:  Recent Labs Lab 03/18/13 1510 03/18/13 1712 03/18/13 2100 03/19/13 0201 03/19/13 1140  CKTOTAL 88  --   --   --   --   TROPONINI  --  <0.30 <0.30 <0.30 <0.30   BNP: BNP (last 3 results)  Recent Labs  04/20/12 1226 12/03/12 0825 03/18/13 1510  PROBNP 113.0* 2120.0* 2838.0*   CBG:  Recent Labs Lab 03/19/13 1557 03/19/13 2123 03/20/13 0554 03/20/13 1159 03/20/13 1624  GLUCAP 150* 147* 142* 165* 178*       Signed:  FELIZ ORTIZ, ABRAHAM  [facility] 03/23/2013, 5:07 PM

## 2013-03-20 NOTE — Telephone Encounter (Signed)
[facility] CALLED WANTING AN APPOINTMENT FOR A HOSPITAL FOLLOW UP FOR THIS PATIENT WITHIN 4 WEEKS.  CANNOT FIND A SLOT FOR A 30 MINUTE APPOINTMENT  PLEASE ADVISE

## 2013-03-20 NOTE — Progress Notes (Addendum)
[facility] PROGRESS NOTE  Assessment/Plan:  Falls/UTI: - At least 5 falls in the last year. X-ray showed no fractures. - Una 14, FENA < 1. Specific gravity high. - UC pending, PT consulted. - change to fosfomycin.   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 WBC high ?Marland Kitchen Has remained afebrile. - CBC post transfusion. - Needs Hematology follow up as an outpatient.     Code Status: full code.  Family Communication: none at bedside.  Disposition Plan: admit under observation for further evaluation of weakness, frequent fall.     Consultants:  none  Procedures:  CT head & Cervical  CXR  X-ray shoulder  Antibiotics:  Rocephin 11.3.2014  HPI/Subjective: No complains.  Objective: Filed Vitals:   03/19/13 2014 03/19/13 2109 03/19/13 2215 03/20/13 0545  BP: 129/58 148/53 145/56 137/60  Pulse: 74 74 73 65  Temp: 99.2 F (37.3 C) 98 F (36.7 C) 98.7 F (37.1 C) 97.9 F (36.6 C)  TempSrc: Oral Oral Oral Oral  Resp: 18 18 18 17   Height:      Weight:    40.5 kg (89 lb 4.6 oz)  SpO2: 98% 98% 98% 100%    Intake/Output Summary (Last 24 hours) at 03/20/13 0813 Last data filed at 03/20/13 0800  Gross per 24 hour  Intake 3073.75 ml  Output      3 ml  Net 3070.75 ml   Filed Weights   03/19/13 0505 03/19/13 0510 03/20/13 0545  Weight: 40.4 kg (89 lb 1.1 oz) 39.9 kg (87 lb 15.4 oz) 40.5 kg (89 lb 4.6 oz)    Exam:  General: Alert, awake, oriented x3, in no acute distress.  HEENT: No bruits, no goiter.  Heart: Regular rate and rhythm, without murmurs, rubs, gallops.  Lungs: Good air movement, bilateral air movement.  Abdomen: Soft, nontender, nondistended, positive bowel sounds.  Neuro: Grossly intact, nonfocal.   Data Reviewed: Basic Metabolic Panel:  Recent Labs Lab 03/18/13 1510 03/18/13 2100 03/19/13 0208  NA 138  --  138  K 3.0*  --  4.1  CL 94*  --  101  CO2 31  --  30  GLUCOSE 236*  --   156*  BUN 27*  --  27*  CREATININE 1.07 1.15* 1.14*  CALCIUM 9.2  --  8.5   Liver Function Tests: No results found for this basename: AST, ALT, ALKPHOS, BILITOT, PROT, ALBUMIN,  in the last 168 hours No results found for this basename: LIPASE, AMYLASE,  in the last 168 hours No results found for this basename: AMMONIA,  in the last 168 hours CBC:  Recent Labs Lab 03/18/13 1510 03/18/13 2100 03/19/13 0208  WBC 32.9* 25.3* 20.1*  NEUTROABS 30.6*  --   --   HGB 7.7* 7.4* 6.5*  HCT 23.8* 22.9* 20.7*  MCV 101.3* 100.4* 100.5*  PLT 173 155 152   Cardiac Enzymes:  Recent Labs Lab 03/18/13 1510 03/18/13 1712 03/18/13 2100 03/19/13 0201 03/19/13 1140  CKTOTAL 88  --   --   --   --   TROPONINI  --  <0.30 <0.30 <0.30 <0.30   BNP (last 3 results)  Recent Labs  04/20/12 1226 12/03/12 0825 03/18/13 1510  PROBNP 113.0* 2120.0* 2838.0*   CBG:  Recent Labs Lab 03/18/13 2212 03/19/13 0605 03/19/13 1132 03/19/13 1557 03/19/13 2123  GLUCAP 142* 148* 216* 150* 147*    Recent Results (from the past 240 hour(s))  URINE CULTURE     Status: None  Collection Time    03/18/13  6:10 PM      Result Value Range Status   Specimen Description URINE, RANDOM   Final   Special Requests NONE   Final   Culture  Setup Time     Final   Value: 03/19/2013 01:03     Performed at [facility] Count     Final   Value: >=100,000 COLONIES/ML     Performed at [facility]   Culture     Final   Value: Multiple bacterial morphotypes present, none predominant. Suggest appropriate recollection if clinically indicated.     Performed at [facility]   Report Status 03/20/2013 FINAL   Final     Studies: Dg Chest 1 View  03/18/2013   CLINICAL DATA:  Fall today. Chest and left shoulder pain.  EXAM: CHEST - 1 VIEW  COMPARISON:  03/16/2013 and 01/19/2013 radiographs.  FINDINGS: Left subclavian pacemaker leads appear unchanged within the right atrium and right  ventricle. The heart size is normal. The aorta is diffusely tortuous. The lungs are clear. There is no pleural effusion or pneumothorax.  The bones are diffusely demineralized. The subacromial space of both shoulders is obliterated consistent with chronic rotator cuff tears. No acute osseous findings are identified.  IMPRESSION: No acute cardiopulmonary process demonstrated. Chronic bilateral rotator cuff tears.   Electronically Signed   By: Roxy Horseman M.D.   On: 03/18/2013 16:35   Ct Head Wo Contrast  03/18/2013   CLINICAL DATA:  Fall. Dizziness with hypertension. Recent chemotherapy for bladder cancer. History of diabetes.  EXAM: CT HEAD WITHOUT CONTRAST  CT CERVICAL SPINE WITHOUT CONTRAST  TECHNIQUE: Multidetector CT imaging of the head and cervical spine was performed following the standard protocol without intravenous contrast. Multiplanar CT image reconstructions of the cervical spine were also generated.  COMPARISON:  Prior examinations 01/19/2013.  FINDINGS: CT HEAD FINDINGS  There is stable mild atrophy with confluent periventricular low density consistent with chronic small vessel ischemic change. No cortically based infarct is identified. There is no evidence of acute intracranial hemorrhage, mass lesion, brain edema or extra-axial fluid collection.  The visualized paranasal sinuses, mastoid air cells and middle ears are clear. There is stable calvarial demineralization and hyperostosis. No fractures or focal lesions identified.  CT CERVICAL SPINE FINDINGS  There is no evidence of acute cervical spine fracture, traumatic subluxation or static sign of instability. There is a mild superior endplate compression deformity at T1 which appears unchanged. There is also a chronic compression deformity at T4, unchanged for chest CT performed in July.  There is multilevel spondylosis with disc space loss and uncinate spurring, greatest from C4-5 through C6-7. No acute soft tissue findings are evident. There  are diffuse vascular calcifications. There is an aberrant right subclavian artery.  IMPRESSION: 1. No acute intracranial are calvarial findings. 2. No evidence of acute cervical spine fracture, traumatic subluxation or static signs of instability. Compression deformities at T1 and T4 appear unchanged. 3. Stable chronic periventricular white matter disease, atherosclerosis and cervical spondylosis.   Electronically Signed   By: Roxy Horseman M.D.   On: 03/18/2013 16:11   Ct Cervical Spine Wo Contrast  03/18/2013   CLINICAL DATA:  Fall. Dizziness with hypertension. Recent chemotherapy for bladder cancer. History of diabetes.  EXAM: CT HEAD WITHOUT CONTRAST  CT CERVICAL SPINE WITHOUT CONTRAST  TECHNIQUE: Multidetector CT imaging of the head and cervical spine was performed following the standard protocol without intravenous  contrast. Multiplanar CT image reconstructions of the cervical spine were also generated.  COMPARISON:  Prior examinations 01/19/2013.  FINDINGS: CT HEAD FINDINGS  There is stable mild atrophy with confluent periventricular low density consistent with chronic small vessel ischemic change. No cortically based infarct is identified. There is no evidence of acute intracranial hemorrhage, mass lesion, brain edema or extra-axial fluid collection.  The visualized paranasal sinuses, mastoid air cells and middle ears are clear. There is stable calvarial demineralization and hyperostosis. No fractures or focal lesions identified.  CT CERVICAL SPINE FINDINGS  There is no evidence of acute cervical spine fracture, traumatic subluxation or static sign of instability. There is a mild superior endplate compression deformity at T1 which appears unchanged. There is also a chronic compression deformity at T4, unchanged for chest CT performed in July.  There is multilevel spondylosis with disc space loss and uncinate spurring, greatest from C4-5 through C6-7. No acute soft tissue findings are evident. There are  diffuse vascular calcifications. There is an aberrant right subclavian artery.  IMPRESSION: 1. No acute intracranial are calvarial findings. 2. No evidence of acute cervical spine fracture, traumatic subluxation or static signs of instability. Compression deformities at T1 and T4 appear unchanged. 3. Stable chronic periventricular white matter disease, atherosclerosis and cervical spondylosis.   Electronically Signed   By: Roxy Horseman M.D.   On: 03/18/2013 16:11   Dg Shoulder Left  03/18/2013   CLINICAL DATA:  Initial encounter for left shoulder injury due to a fall earlier today.  EXAM: LEFT SHOULDER - 2+ VIEW  COMPARISON:  03/16/2013, 10/23/2010.  FINDINGS: No evidence of acute fracture or glenohumeral dislocation. Acromioclavicular joint intact with degenerative changes. Marked narrowing of the subacromial space, not significantly changed since 2012. Generalized osseous demineralization.  IMPRESSION: 1. No acute osseous abnormality. 2. Stable marked narrowing of the subacromial space consistent with chronic supraspinatus tendon disease. 3. Stable moderate degenerative changes involving the AC joint. 4. Generalized osseous demineralization.   Electronically Signed   By: Hulan Saas M.D.   On: 03/18/2013 16:24    Scheduled Meds: . aspirin EC  81 mg Oral Daily  . atorvastatin  40 mg Oral q1800  . ciprofloxacin  400 mg Intravenous Q24H  . darifenacin  7.5 mg Oral Daily  . docusate sodium  100 mg Oral BID  . enoxaparin (LOVENOX) injection  20 mg Subcutaneous Q24H  . feeding supplement (ENSURE COMPLETE)  237 mL Oral TID BM  . fosfomycin  3 g Oral Q3 days  . insulin aspart  0-9 Units Subcutaneous TID WC  . levothyroxine  112 mcg Oral QAC breakfast  . pantoprazole  40 mg Oral Daily  . PARoxetine  10 mg Oral QPM   Continuous Infusions: . sodium chloride 50 mL/hr (03/19/13 1000)     Radonna Ricker Rosine Beat  [facility] Pager [phone]. If 8PM-8AM, please contact night-coverage at  [email or web address], password [facility] 03/20/2013, 8:13 AM  LOS: 2 days

## 2013-03-20 NOTE — Clinical Social Work Note (Addendum)
Clinical Social Work Department CLINICAL SOCIAL WORK PLACEMENT NOTE 03/20/2013  Patient:  [name], [name]  Account Number:  [id number] Admit date:  03/18/2013  Clinical Social Worker:  Macario Golds, LCSW  Date/time:  03/20/2013 11:00 AM  Clinical Social Work is seeking post-discharge placement for this patient at the following level of care:   SKILLED NURSING   (*CSW will update this form in Epic as items are completed)   03/20/2013  Patient/family provided with [facility] Department of Clinical Social Work's list of facilities offering this level of care within the geographic area requested by the patient (or if unable, by the patient's family).  03/20/2013  Patient/family informed of their freedom to choose among providers that offer the needed level of care, that participate in Medicare, Medicaid or managed care program needed by the patient, have an available bed and are willing to accept the patient.  03/20/2013  Patient/family informed of [facility]' ownership interest in [facility], as well as of the fact that they are under no obligation to receive care at this facility.  PASARR submitted to EDS on 03/20/2013 PASARR number received from [employer]DS on 03/20/2013  FL2 transmitted to all facilities in geographic area requested by pt/family on  03/20/2013 FL2 transmitted to all facilities within larger geographic area on   Patient informed that his/her managed care company has contracts with or will negotiate with  certain facilities, including the following:     Patient/family informed of bed offers received:  03/20/2013  Patient chooses bed at  [facility] Physician recommends and patient chooses bed at    Patient to be transferred to [facility] on  03/20/2013   Patient to be transferred to facility by  [facility] Authorization received  The following physician request were entered in Epic:   Additional Comments: 11/04 Awaiting [facility] Medicare  Authorization

## 2013-03-20 NOTE — Progress Notes (Signed)
The final unit of blood was transfused at the start of night shift.  The patient did not have any reactions and tolerated the process well.  The blood form was placed in the patient's chart.

## 2013-03-20 NOTE — Progress Notes (Signed)
Discharge instructions discussed with patient. Discussed medications, Diet, activity and dollow up appointments. Patient verbally understands instructions. Ambulance has been called by Verdon Cummins, SW to transport patient to facility.

## 2013-03-21 NOTE — Telephone Encounter (Signed)
You will have to put 2 15 minute visits together, just not on a Tuesday

## 2013-03-26 ENCOUNTER — Other Ambulatory Visit (HOSPITAL_BASED_OUTPATIENT_CLINIC_OR_DEPARTMENT_OTHER): Payer: Medicare Other | Admitting: Lab

## 2013-03-26 ENCOUNTER — Telehealth: Payer: Self-pay | Admitting: Oncology

## 2013-03-26 ENCOUNTER — Ambulatory Visit (HOSPITAL_BASED_OUTPATIENT_CLINIC_OR_DEPARTMENT_OTHER): Payer: Medicare Other | Admitting: Nurse Practitioner

## 2013-03-26 ENCOUNTER — Ambulatory Visit: Payer: Medicare Other

## 2013-03-26 VITALS — BP 134/64 | HR 76 | Temp 98.9°F | Resp 17 | Ht 60.0 in | Wt 89.4 lb

## 2013-03-26 DIAGNOSIS — D649 Anemia, unspecified: Secondary | ICD-10-CM

## 2013-03-26 DIAGNOSIS — E119 Type 2 diabetes mellitus without complications: Secondary | ICD-10-CM

## 2013-03-26 DIAGNOSIS — D469 Myelodysplastic syndrome, unspecified: Secondary | ICD-10-CM

## 2013-03-26 DIAGNOSIS — D72829 Elevated white blood cell count, unspecified: Secondary | ICD-10-CM

## 2013-03-26 DIAGNOSIS — N189 Chronic kidney disease, unspecified: Secondary | ICD-10-CM

## 2013-03-26 DIAGNOSIS — I10 Essential (primary) hypertension: Secondary | ICD-10-CM

## 2013-03-26 LAB — CBC WITH DIFFERENTIAL/PLATELET
Basophils Absolute: 0.1 10*3/uL (ref 0.0–0.1)
EOS%: 0.7 % (ref 0.0–7.0)
HCT: 36.6 % (ref 34.8–46.6)
HGB: 11.6 g/dL (ref 11.6–15.9)
MCH: 29.5 pg (ref 25.1–34.0)
MCV: 93 fL (ref 79.5–101.0)
MONO#: 1.2 10*3/uL — ABNORMAL HIGH (ref 0.1–0.9)
MONO%: 4.4 % (ref 0.0–14.0)
NEUT%: 90.2 % — ABNORMAL HIGH (ref 38.4–76.8)
Platelets: 216 10*3/uL (ref 145–400)

## 2013-03-26 LAB — MORPHOLOGY: PLT EST: ADEQUATE

## 2013-03-26 MED ORDER — DARBEPOETIN ALFA-POLYSORBATE 300 MCG/0.6ML IJ SOLN: 300.0000 ug | Freq: Once | INTRAMUSCULAR | Status: DC

## 2013-03-26 NOTE — Progress Notes (Signed)
OFFICE PROGRESS NOTE  Interval history:  [name] is an 79 year old woman with multifactorial anemia with a component of chronic renal insufficiency and underlying myeloproliferative disorder with dysplastic features. She is maintained on Aranesp injections most recently on a 3 week schedule. She last received an injection on 03/05/2013 at which time her hemoglobin was 9.7.  She was hospitalized 03/18/2013 through 03/20/2013 following a fall. Initial hemoglobin was 7.7 falling to a low of 6.5. She was transfused. Prior to discharge on 03/20/2013 the hemoglobin was 10.3.  He presents today for scheduled followup.  She is currently residing at a nursing facility. She is participating in physical therapy on a daily basis. She has stable dyspnea on exertion. She denies any bleeding. Her daughter has noted swelling over the left shoulder blade since she fell. The swelling is better. She has had no further falls.   Objective: Blood pressure 134/64, pulse 76, temperature 98.9 F (37.2 C), temperature source Oral, resp. rate 17, height 5' (1.524 m), weight 89 lb 6.4 oz (40.552 kg), SpO2 100.00%.  Oropharynx is without thrush or ulceration. No palpable cervical or supraclavicular lymph nodes. Lungs are clear. Regular cardiac rhythm. Abdomen is soft and nontender. No leg edema. Edema at the left shoulder blade measuring approximately 10 x 13 cm. Extensive resolving ecchymosis along the left lateral chest wall.  Lab Results: Lab Results  Component Value Date   WBC 27.4* 03/26/2013   HGB 11.6 03/26/2013   HCT 36.6 03/26/2013   MCV 93.0 03/26/2013   PLT 216 03/26/2013    Chemistry:    Chemistry      Component Value Date/Time   NA 138 03/19/2013 0208   K 4.1 03/19/2013 0208   CL 101 03/19/2013 0208   CO2 30 03/19/2013 0208   BUN 27* 03/19/2013 0208   CREATININE 1.14* 03/19/2013 0208   CREATININE 1.07 11/07/2012 1151      Component Value Date/Time   CALCIUM 8.5 03/19/2013 0208   ALKPHOS 58  12/03/2012 0825   AST 31 12/03/2012 0825   ALT 24 12/03/2012 0825   BILITOT 0.4 12/03/2012 0825       Studies/Results: Dg Chest 1 View  03/18/2013   CLINICAL DATA:  Fall today. Chest and left shoulder pain.  EXAM: CHEST - 1 VIEW  COMPARISON:  03/16/2013 and 01/19/2013 radiographs.  FINDINGS: Left subclavian pacemaker leads appear unchanged within the right atrium and right ventricle. The heart size is normal. The aorta is diffusely tortuous. The lungs are clear. There is no pleural effusion or pneumothorax.  The bones are diffusely demineralized. The subacromial space of both shoulders is obliterated consistent with chronic rotator cuff tears. No acute osseous findings are identified.  IMPRESSION: No acute cardiopulmonary process demonstrated. Chronic bilateral rotator cuff tears.   Electronically Signed   By: Roxy Horseman M.D.   On: 03/18/2013 16:35   Dg Ribs Unilateral W/chest Left  03/16/2013   CLINICAL DATA:  Fall, left rib pain  EXAM: LEFT RIBS AND CHEST - 3+ VIEW  COMPARISON:  01/19/2013  FINDINGS: Three views left ribs submitted. Cardiomediastinal silhouette is stable. Dual lead cardiac pacemaker is unchanged in position. There is mild displaced fracture of the left 4th 5th and 6th rib. No diagnostic pneumothorax. Diffuse osteopenia.  IMPRESSION: Mild displaced fracture of the left 4th 5th and 6th rib. No diagnostic pneumothorax.   Electronically Signed   By: Natasha Mead M.D.   On: 03/16/2013 13:40   Ct Head Wo Contrast  03/18/2013   CLINICAL DATA:  Fall. Dizziness with hypertension. Recent chemotherapy for bladder cancer. History of diabetes.  EXAM: CT HEAD WITHOUT CONTRAST  CT CERVICAL SPINE WITHOUT CONTRAST  TECHNIQUE: Multidetector CT imaging of the head and cervical spine was performed following the standard protocol without intravenous contrast. Multiplanar CT image reconstructions of the cervical spine were also generated.  COMPARISON:  Prior examinations 01/19/2013.  FINDINGS: CT HEAD  FINDINGS  There is stable mild atrophy with confluent periventricular low density consistent with chronic small vessel ischemic change. No cortically based infarct is identified. There is no evidence of acute intracranial hemorrhage, mass lesion, brain edema or extra-axial fluid collection.  The visualized paranasal sinuses, mastoid air cells and middle ears are clear. There is stable calvarial demineralization and hyperostosis. No fractures or focal lesions identified.  CT CERVICAL SPINE FINDINGS  There is no evidence of acute cervical spine fracture, traumatic subluxation or static sign of instability. There is a mild superior endplate compression deformity at T1 which appears unchanged. There is also a chronic compression deformity at T4, unchanged for chest CT performed in July.  There is multilevel spondylosis with disc space loss and uncinate spurring, greatest from C4-5 through C6-7. No acute soft tissue findings are evident. There are diffuse vascular calcifications. There is an aberrant right subclavian artery.  IMPRESSION: 1. No acute intracranial are calvarial findings. 2. No evidence of acute cervical spine fracture, traumatic subluxation or static signs of instability. Compression deformities at T1 and T4 appear unchanged. 3. Stable chronic periventricular white matter disease, atherosclerosis and cervical spondylosis.   Electronically Signed   By: Roxy Horseman M.D.   On: 03/18/2013 16:11   Ct Cervical Spine Wo Contrast  03/18/2013   CLINICAL DATA:  Fall. Dizziness with hypertension. Recent chemotherapy for bladder cancer. History of diabetes.  EXAM: CT HEAD WITHOUT CONTRAST  CT CERVICAL SPINE WITHOUT CONTRAST  TECHNIQUE: Multidetector CT imaging of the head and cervical spine was performed following the standard protocol without intravenous contrast. Multiplanar CT image reconstructions of the cervical spine were also generated.  COMPARISON:  Prior examinations 01/19/2013.  FINDINGS: CT HEAD  FINDINGS  There is stable mild atrophy with confluent periventricular low density consistent with chronic small vessel ischemic change. No cortically based infarct is identified. There is no evidence of acute intracranial hemorrhage, mass lesion, brain edema or extra-axial fluid collection.  The visualized paranasal sinuses, mastoid air cells and middle ears are clear. There is stable calvarial demineralization and hyperostosis. No fractures or focal lesions identified.  CT CERVICAL SPINE FINDINGS  There is no evidence of acute cervical spine fracture, traumatic subluxation or static sign of instability. There is a mild superior endplate compression deformity at T1 which appears unchanged. There is also a chronic compression deformity at T4, unchanged for chest CT performed in July.  There is multilevel spondylosis with disc space loss and uncinate spurring, greatest from C4-5 through C6-7. No acute soft tissue findings are evident. There are diffuse vascular calcifications. There is an aberrant right subclavian artery.  IMPRESSION: 1. No acute intracranial are calvarial findings. 2. No evidence of acute cervical spine fracture, traumatic subluxation or static signs of instability. Compression deformities at T1 and T4 appear unchanged. 3. Stable chronic periventricular white matter disease, atherosclerosis and cervical spondylosis.   Electronically Signed   By: Roxy Horseman M.D.   On: 03/18/2013 16:11   Dg Shoulder Left  03/18/2013   CLINICAL DATA:  Initial encounter for left shoulder injury due to a fall earlier today.  EXAM: LEFT SHOULDER -  2+ VIEW  COMPARISON:  03/16/2013, 10/23/2010.  FINDINGS: No evidence of acute fracture or glenohumeral dislocation. Acromioclavicular joint intact with degenerative changes. Marked narrowing of the subacromial space, not significantly changed since 2012. Generalized osseous demineralization.  IMPRESSION: 1. No acute osseous abnormality. 2. Stable marked narrowing of the  subacromial space consistent with chronic supraspinatus tendon disease. 3. Stable moderate degenerative changes involving the AC joint. 4. Generalized osseous demineralization.   Electronically Signed   By: Hulan Saas M.D.   On: 03/18/2013 16:24   Dg Shoulder Left  03/16/2013   CLINICAL DATA:  Shoulder pain after falling 4 days ago.  EXAM: LEFT SHOULDER - 2+ VIEW  COMPARISON:  Shoulder radiographs 10/23/2010.  FINDINGS: The bones are diffusely demineralized. There is no evidence of acute fracture or dislocation. The subacromial space is narrowed consistent with a chronic rotator cuff tear. Mild glenohumeral and acromioclavicular degenerative changes are noted again noted. The patient has a pacemaker with grossly unchanged leads.  IMPRESSION: No acute osseous findings. Degenerative changes as described with persistent evidence of a chronic rotator cuff tear.   Electronically Signed   By: Roxy Horseman M.D.   On: 03/16/2013 14:12    Medications: I have reviewed the patient's current medications.  Assessment/Plan:  1. Multifactorial anemia secondary to chronic renal insufficiency and underlying myeloproliferative disorder with dysplastic features. 2. Mild chronic renal insufficiency. 3. Chronic leukocytosis. 4. Coronary artery disease with history of third degree heart block requiring permanent pacemaker and status post drug-eluting stent to the left anterior descending coronary artery. 5. Hypothyroid on replacement. 6. Hyperlipidemia. 7. Hypertension. 8. Type 2 diabetes. 9. Recurrent falls.  Disposition-hemoglobin today is above the preset range to receive Aranesp. She will return in 2 weeks for a CBC with plans to resume Aranesp on a three-week schedule at that time. She will return for a followup visit in 3 months.  Question if the swelling/fullness at the left shoulder blade is a hematoma. Her daughter will request followup of the swelling per the facility provider.  Plan reviewed with  Dr. Cyndie Chime.  [name] ANP/GNP-BC

## 2013-03-26 NOTE — Telephone Encounter (Signed)
Gave pt appt for lab, injections  unti February and see Dr. Reece Agar on February 2015 lab,MD and injections

## 2013-04-03 ENCOUNTER — Encounter: Payer: Self-pay | Admitting: *Deleted

## 2013-04-09 ENCOUNTER — Other Ambulatory Visit (HOSPITAL_BASED_OUTPATIENT_CLINIC_OR_DEPARTMENT_OTHER): Payer: Medicare Other | Admitting: Lab

## 2013-04-09 ENCOUNTER — Telehealth: Payer: Self-pay | Admitting: *Deleted

## 2013-04-09 ENCOUNTER — Ambulatory Visit: Payer: Medicare Other

## 2013-04-09 DIAGNOSIS — D469 Myelodysplastic syndrome, unspecified: Secondary | ICD-10-CM

## 2013-04-09 DIAGNOSIS — D649 Anemia, unspecified: Secondary | ICD-10-CM

## 2013-04-09 LAB — CBC WITH DIFFERENTIAL/PLATELET
BASO%: 0.2 % (ref 0.0–2.0)
Basophils Absolute: 0.1 10*3/uL (ref 0.0–0.1)
Eosinophils Absolute: 0.1 10*3/uL (ref 0.0–0.5)
HGB: 12 g/dL (ref 11.6–15.9)
MCHC: 31.9 g/dL (ref 31.5–36.0)
MCV: 93.6 fL (ref 79.5–101.0)
MONO#: 1.6 10*3/uL — ABNORMAL HIGH (ref 0.1–0.9)
MONO%: 3.8 % (ref 0.0–14.0)
NEUT#: 39.2 10*3/uL — ABNORMAL HIGH (ref 1.5–6.5)
Platelets: 183 10*3/uL (ref 145–400)
RDW: 21.9 % — ABNORMAL HIGH (ref 11.2–14.5)
WBC: 41.9 10*3/uL — ABNORMAL HIGH (ref 3.9–10.3)
lymph#: 0.9 10*3/uL (ref 0.9–3.3)

## 2013-04-09 MED ORDER — DARBEPOETIN ALFA-POLYSORBATE 300 MCG/0.6ML IJ SOLN: 300.0000 ug | Freq: Once | INTRAMUSCULAR | Status: DC

## 2013-04-09 NOTE — Telephone Encounter (Signed)
LMOM with contact name and number for return call RE: request for new completion of Physician Home Health Certification for pt assistance with dressing & bathing and further provider inquiry as to what needs to be done differently.?/SLS

## 2013-04-09 NOTE — Telephone Encounter (Signed)
Maybe I misunderstood the form but I thought I checked she needed help with all of those things? Give me back the forms and I will review

## 2013-04-09 NOTE — Telephone Encounter (Signed)
Mr. Rolm Baptise called back and [facility] stating that "on the form you put that patient could do everything and can perform DLAs w/o assistance; you just put down that patient needs help with eating, not that she needed assistance with dressing and/or bathing" .[?]; he further stated that patient needed standby assistance for transferring also. Mr. Rolm Baptise went on to say that patient had another fall and is currently in [facility] in [place] at this time/SLS

## 2013-04-10 NOTE — Telephone Encounter (Signed)
Forms given back to MD

## 2013-04-10 NOTE — Telephone Encounter (Signed)
done

## 2013-04-14 ENCOUNTER — Other Ambulatory Visit: Payer: Self-pay | Admitting: Internal Medicine

## 2013-04-16 ENCOUNTER — Other Ambulatory Visit: Payer: Self-pay | Admitting: Internal Medicine

## 2013-04-16 ENCOUNTER — Encounter: Payer: Self-pay | Admitting: Internal Medicine

## 2013-04-16 ENCOUNTER — Ambulatory Visit (INDEPENDENT_AMBULATORY_CARE_PROVIDER_SITE_OTHER): Payer: Medicare Other | Admitting: *Deleted

## 2013-04-16 DIAGNOSIS — Z950 Presence of cardiac pacemaker: Secondary | ICD-10-CM

## 2013-04-16 DIAGNOSIS — I442 Atrioventricular block, complete: Secondary | ICD-10-CM

## 2013-04-16 LAB — MDC_IDC_ENUM_SESS_TYPE_INCLINIC
Battery Remaining Longevity: 47 mo
Brady Statistic AP VP Percent: 2 %
Brady Statistic AP VS Percent: 0 %
Brady Statistic AS VP Percent: 97 %
Brady Statistic AS VS Percent: 0 %
Date Time Interrogation Session: 20141201180938
Lead Channel Impedance Value: 495 Ohm
Lead Channel Pacing Threshold Amplitude: 0.75 V
Lead Channel Pacing Threshold Amplitude: 1 V
Lead Channel Pacing Threshold Pulse Width: 0.4 ms
Lead Channel Sensing Intrinsic Amplitude: 5.6 mV
Lead Channel Setting Pacing Pulse Width: 0.4 ms

## 2013-04-19 NOTE — Progress Notes (Signed)
Pacemaker check in clinic. Normal device function. Thresholds, sensing, impedances consistent with previous measurements. Device programmed to maximize longevity. 57 mode switches (<0.1%)---2 AHR episodes---max dur. 1 min. 22 sec, Max A 150, Max V 95---PACs. 1 NSVT episode (79/197) on 8-30 X 8 bts. Device programmed at appropriate safety margins. Histogram distribution appropriate for patient activity level. Device programmed to optimize intrinsic conduction. Estimated longevity 4 years. Patient will follow up with [facility] in April 2015.

## 2013-04-30 ENCOUNTER — Other Ambulatory Visit (HOSPITAL_BASED_OUTPATIENT_CLINIC_OR_DEPARTMENT_OTHER): Payer: Medicare Other

## 2013-04-30 ENCOUNTER — Ambulatory Visit (HOSPITAL_BASED_OUTPATIENT_CLINIC_OR_DEPARTMENT_OTHER): Payer: Medicare Other

## 2013-04-30 VITALS — BP 120/60 | HR 83 | Temp 97.4°F

## 2013-04-30 DIAGNOSIS — D649 Anemia, unspecified: Secondary | ICD-10-CM

## 2013-04-30 DIAGNOSIS — D469 Myelodysplastic syndrome, unspecified: Secondary | ICD-10-CM

## 2013-04-30 LAB — MORPHOLOGY: PLT EST: ADEQUATE

## 2013-04-30 LAB — CBC WITH DIFFERENTIAL/PLATELET
BASO%: 0.2 % (ref 0.0–2.0)
EOS%: 0.3 % (ref 0.0–7.0)
Eosinophils Absolute: 0.1 10*3/uL (ref 0.0–0.5)
HCT: 31.9 % — ABNORMAL LOW (ref 34.8–46.6)
MCH: 31.4 pg (ref 25.1–34.0)
MCHC: 32.5 g/dL (ref 31.5–36.0)
MONO#: 1.2 10*3/uL — ABNORMAL HIGH (ref 0.1–0.9)
NEUT#: 21.4 10*3/uL — ABNORMAL HIGH (ref 1.5–6.5)
NEUT%: 91 % — ABNORMAL HIGH (ref 38.4–76.8)
RBC: 3.29 10*6/uL — ABNORMAL LOW (ref 3.70–5.45)
RDW: 22.4 % — ABNORMAL HIGH (ref 11.2–14.5)
WBC: 23.6 10*3/uL — ABNORMAL HIGH (ref 3.9–10.3)
lymph#: 0.8 10*3/uL — ABNORMAL LOW (ref 0.9–3.3)

## 2013-04-30 MED ORDER — DARBEPOETIN ALFA-POLYSORBATE 300 MCG/0.6ML IJ SOLN
300.0000 ug | Freq: Once | INTRAMUSCULAR | Status: AC
  Administered 2013-04-30: 300 ug via SUBCUTANEOUS
  Filled 2013-04-30: qty 0.6

## 2013-05-18 ENCOUNTER — Telehealth: Payer: Self-pay | Admitting: *Deleted

## 2013-05-18 ENCOUNTER — Other Ambulatory Visit: Payer: Self-pay | Admitting: Oncology

## 2013-05-18 NOTE — Telephone Encounter (Signed)
[name], dtr. Calls to say her Mom has been in [employer] for about 8 weeks.  She had been doing well until about 2 weeks ago when she started declining.  They met with Education officer, museum (including patient) and have decided patient will go home with Hospice Referral.  She only weighs 77# with her clothes on.  Dtr. Is asking if she should come for her aranesp injection?  They are willing to bring her if Dr. Beryle Beams thinks it will be helpful, however the injections are very painful for her.  Discussed with Dr. Beryle Beams.  Will hold for now, however if she starts improving we can re-start them.  Called dtr.back and let her know Dr.Granfortunas thoughts on this.  She is fine with this and appreciated the call back.  She will see how she does when she comes home next Thursday and will give [name] a call after a couple of weeks.and let us know how she is doing and if Hospice is seeing her.

## 2013-05-21 ENCOUNTER — Ambulatory Visit: Payer: Medicare Other

## 2013-05-21 ENCOUNTER — Other Ambulatory Visit: Payer: Medicare Other

## 2013-05-22 ENCOUNTER — Telehealth: Payer: Self-pay | Admitting: Family Medicine

## 2013-05-22 NOTE — Telephone Encounter (Signed)
Please advise 

## 2013-05-22 NOTE — Telephone Encounter (Signed)
Juliann Pulse from [facility] called in stating that patient will be discharged from [facility] skilled nursing on Thursday and she wants to know if Dr. Charlett Blake would agree to be the attending provider. Juliann Pulse also wanted to know if Dr. Charlett Blake would want the hospice physicians to help with symptoms management?

## 2013-05-22 NOTE — Telephone Encounter (Signed)
I tried to call but the office was closed.  Please call the office for [name].  Thanks

## 2013-05-22 NOTE — Telephone Encounter (Signed)
OK to be the hospice physician and OK if Hospice physician's to help with symptom management

## 2013-05-23 NOTE — Telephone Encounter (Signed)
Spoke to [place] and informed her of Dr. Frederik Pear decision

## 2013-05-28 ENCOUNTER — Telehealth: Payer: Self-pay | Admitting: Family Medicine

## 2013-05-28 NOTE — Telephone Encounter (Signed)
FYI

## 2013-05-28 NOTE — Telephone Encounter (Signed)
Karel from [facility] called stating that patient passed away on 2013/06/19 at 5:17am.

## 2013-05-30 ENCOUNTER — Telehealth: Payer: Self-pay | Admitting: *Deleted

## 2013-05-30 NOTE — Telephone Encounter (Signed)
Received message from pt daughter Lottie Mussel notifying office pt passed away 14-Jun-2013 and wanted to thank Dr. Beryle Beams for all his care "You are the best"  MD made aware.

## 2013-06-11 ENCOUNTER — Ambulatory Visit: Payer: Medicare Other

## 2013-06-11 ENCOUNTER — Other Ambulatory Visit: Payer: Medicare Other

## 2013-06-17 DEATH — deceased

## 2013-06-26 ENCOUNTER — Ambulatory Visit: Payer: Medicare Other | Admitting: Oncology

## 2013-07-02 ENCOUNTER — Ambulatory Visit: Payer: Medicare Other | Admitting: Oncology

## 2013-07-02 ENCOUNTER — Ambulatory Visit: Payer: Medicare Other

## 2013-07-02 ENCOUNTER — Other Ambulatory Visit: Payer: Medicare Other

## 2013-07-23 ENCOUNTER — Telehealth: Payer: Self-pay

## 2013-07-23 NOTE — Telephone Encounter (Signed)
Patient past away per Obituary in [employer] °

## 2014-01-11 ENCOUNTER — Other Ambulatory Visit: Payer: Self-pay | Admitting: *Deleted

## 2014-02-06 IMAGING — CR DG FEMUR 2V*L*
5 series · 5 of 5 positions shown · non-contrast
Comparison: .  None

CLINICAL DATA: Fall, left leg pain

LEFT FEMUR - 2 VIEW

[t femur distal ap left (1 of 2)]
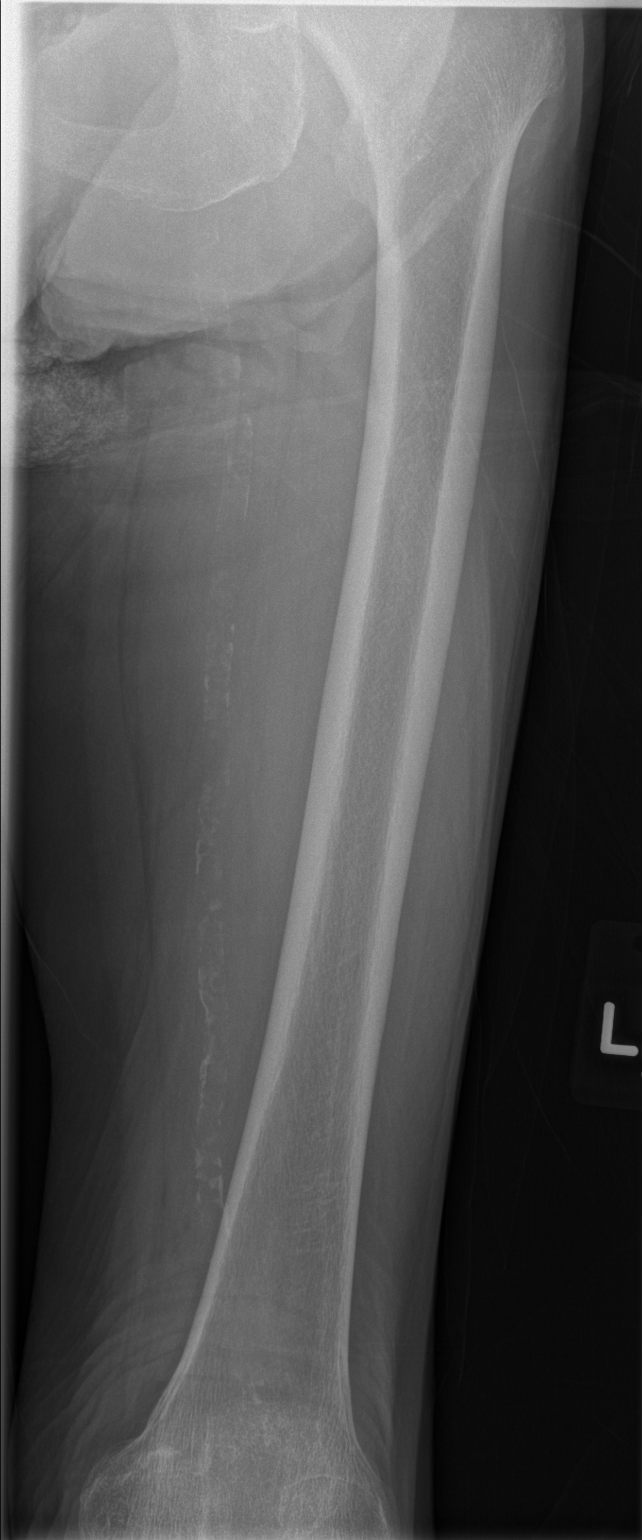

[t femur distal ap left (2 of 2)]
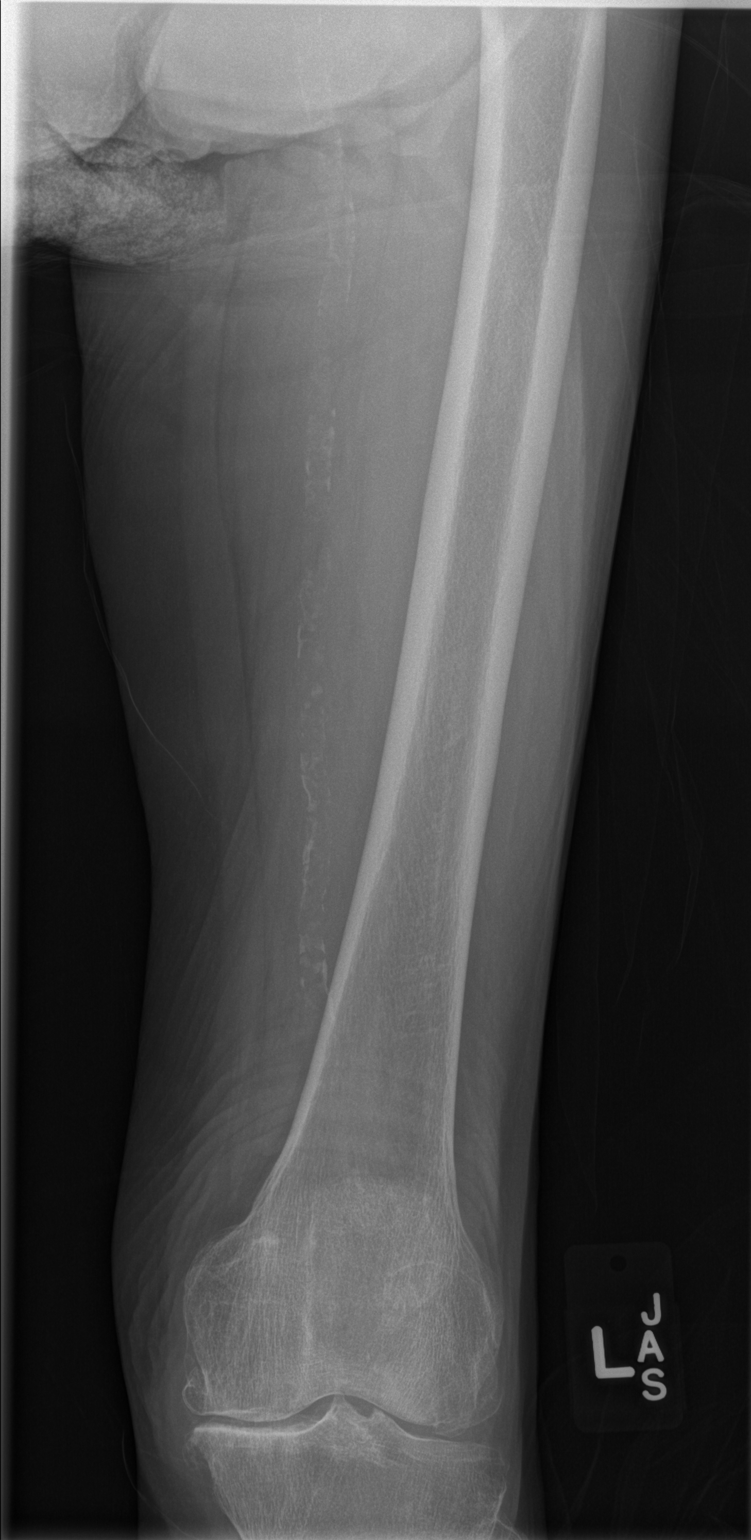

[t femur proximal ap left]
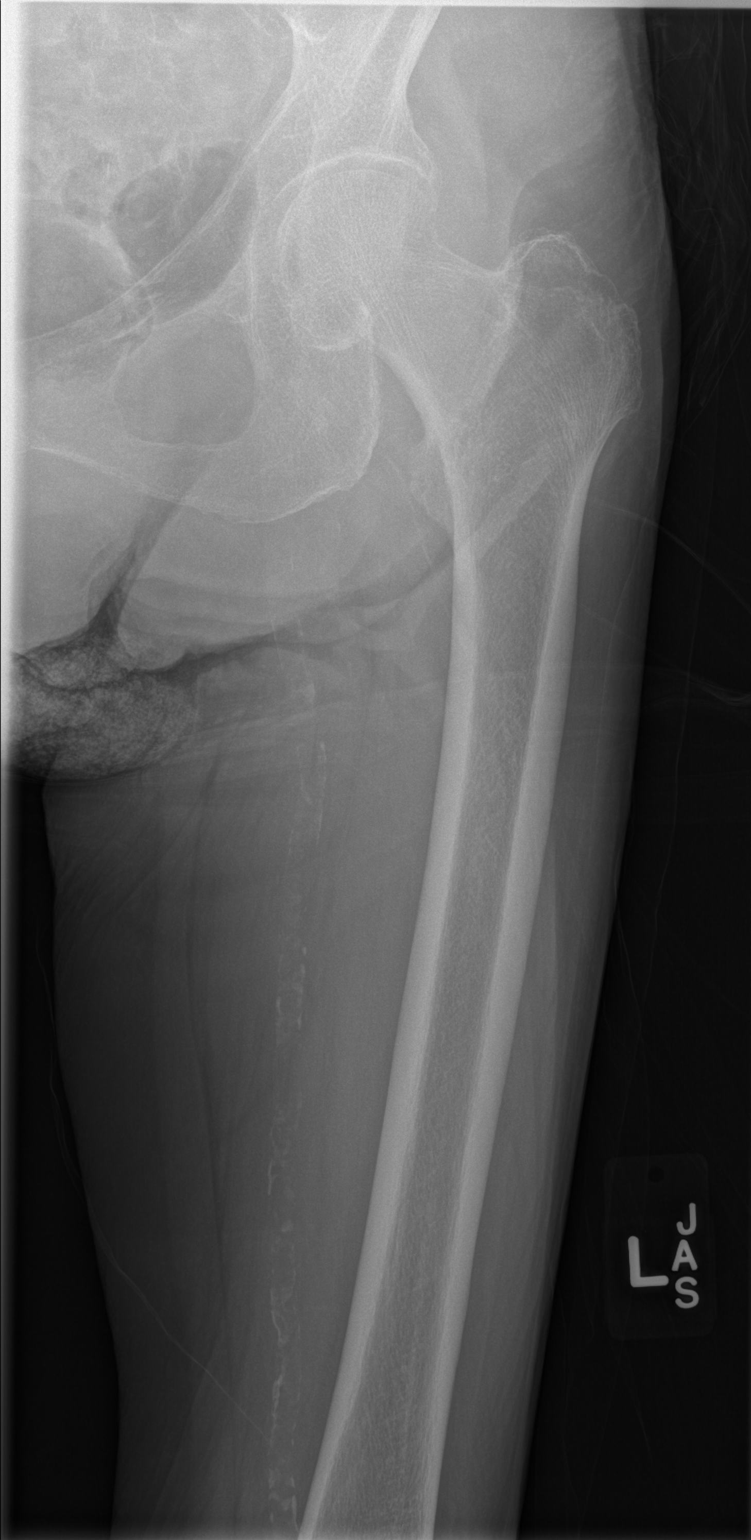

[t femur proximal lat left]
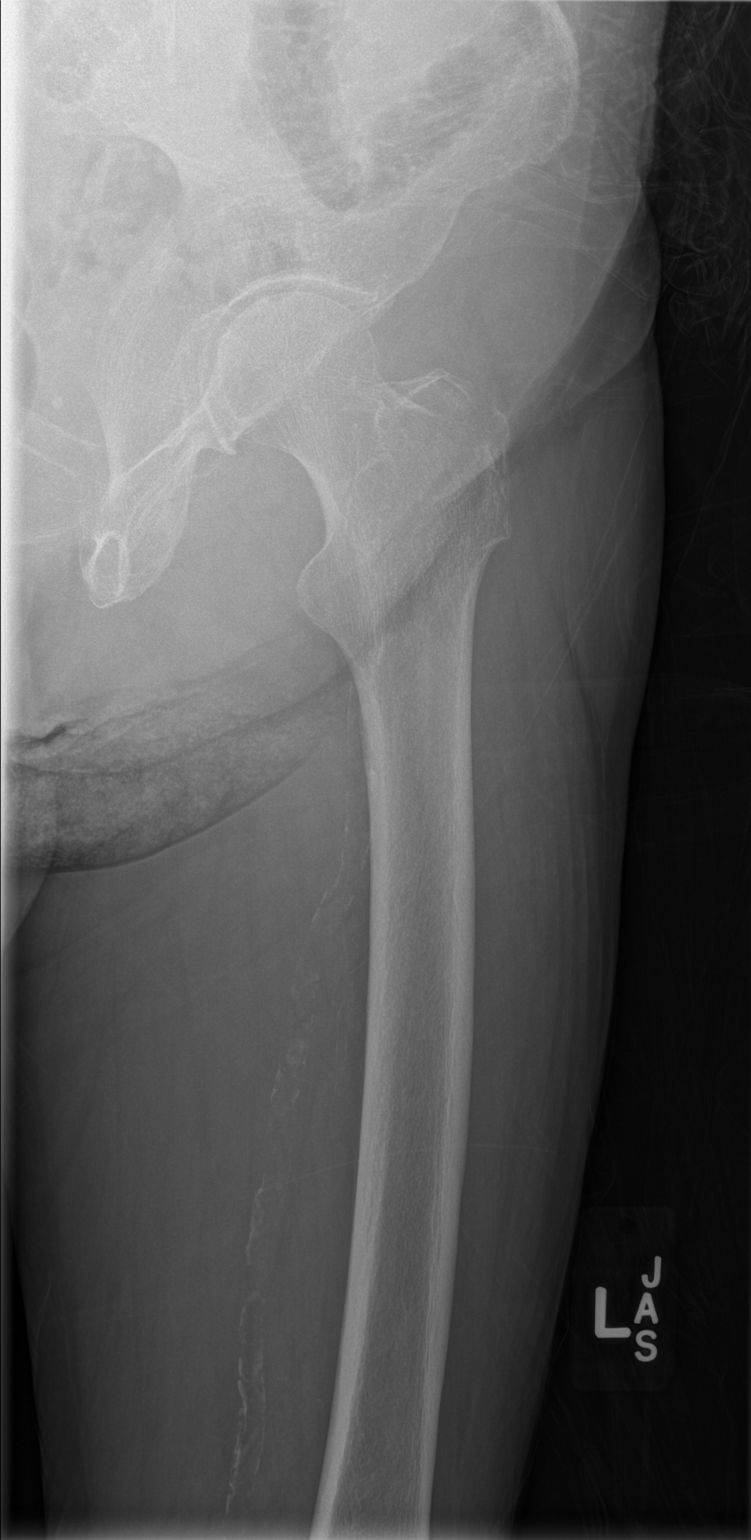

[t femur distal lat left]
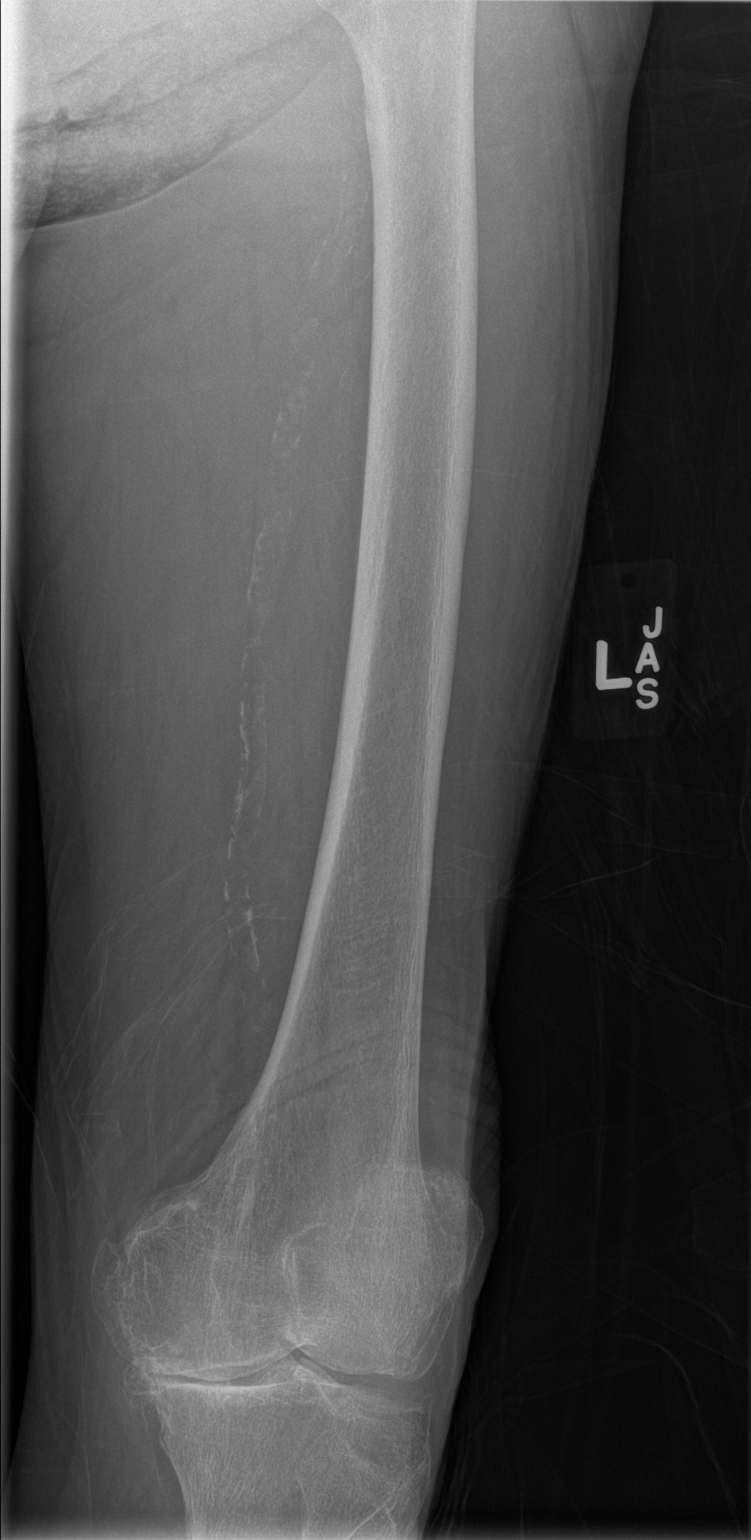

[5 of 5 positions shown; findings below may reference images not displayed]

FINDINGS: No evidence of fracture of the distal left femur.
Vascular calcifications noted.
IMPRESSION: No distal left femur fracture.

## 2014-02-07 IMAGING — CR DG HIP 1V PORT*L*
1 series · 1 of 1 positions shown · non-contrast
Comparison: Intraoperative radiographs 7545 hours the same day and
earlier.

CLINICAL DATA: 88-year-old female status post ORIF left femur.

PORTABLE PELVIS,
PORTABLE LEFT HIP - 1 VIEW

[lateral]
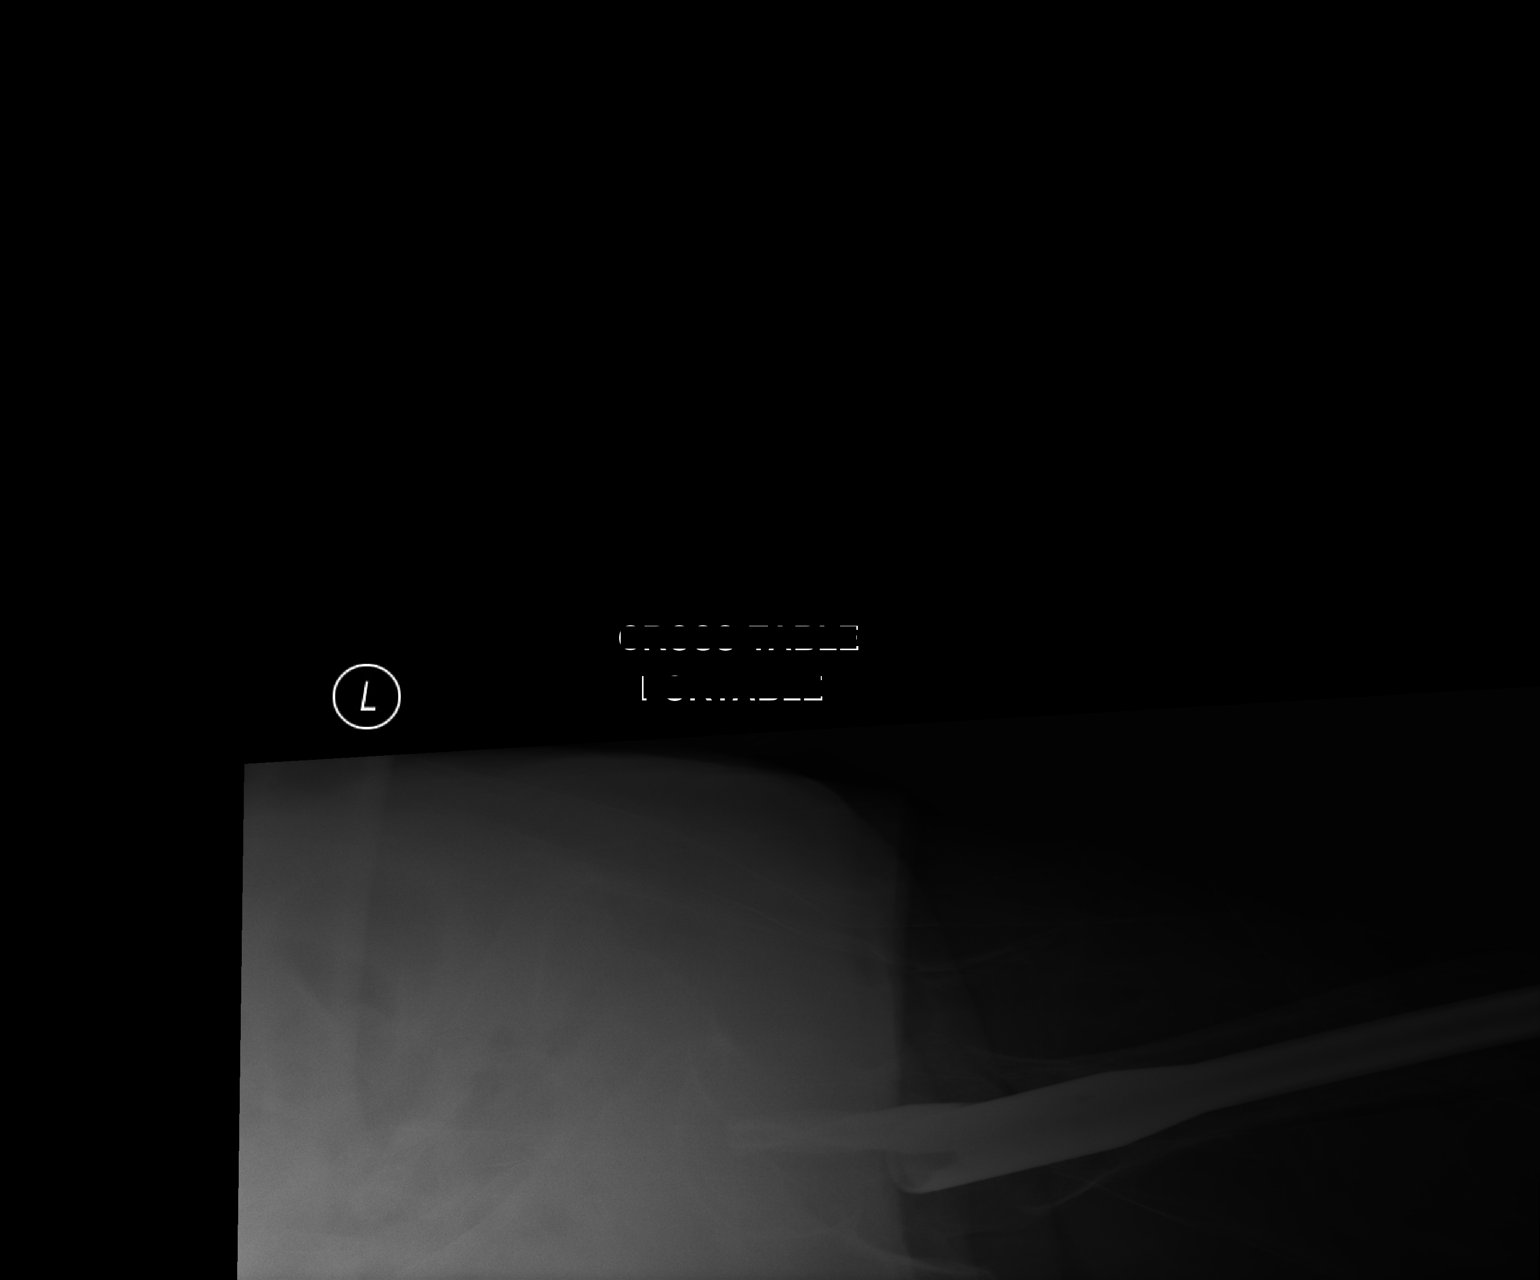

[1 of 1 positions shown; findings below may reference images not displayed]

FINDINGS: Portable pelvis:  AP portable supine views of the pelvis.  Left
femoral intramedullary rod with proximal interlocking dynamic hip
screw traverse the intertrochanteric fracture.  Near anatomic
alignment.  Postoperative changes to the surrounding soft tissues
including subcutaneous gas.  Calcified atherosclerosis.
Osteopenia.  Pelvis appears stable and intact.

Portable left hip:  Portable cross-table lateral view the left hip.
Good alignment at the intertrochanteric fracture.  Left femoral
intramedullary rod intact.
IMPRESSION: Left femoral arthroplasty with near anatomic alignment at the
intertrochanteric fracture and no adverse features.

## 2014-02-07 IMAGING — CR DG PORTABLE PELVIS
3 series · 3 of 3 positions shown · non-contrast
Comparison: Intraoperative radiographs 7545 hours the same day and
earlier.

CLINICAL DATA: 88-year-old female status post ORIF left femur.

PORTABLE PELVIS,
PORTABLE LEFT HIP - 1 VIEW

[AP (1 of 3)]
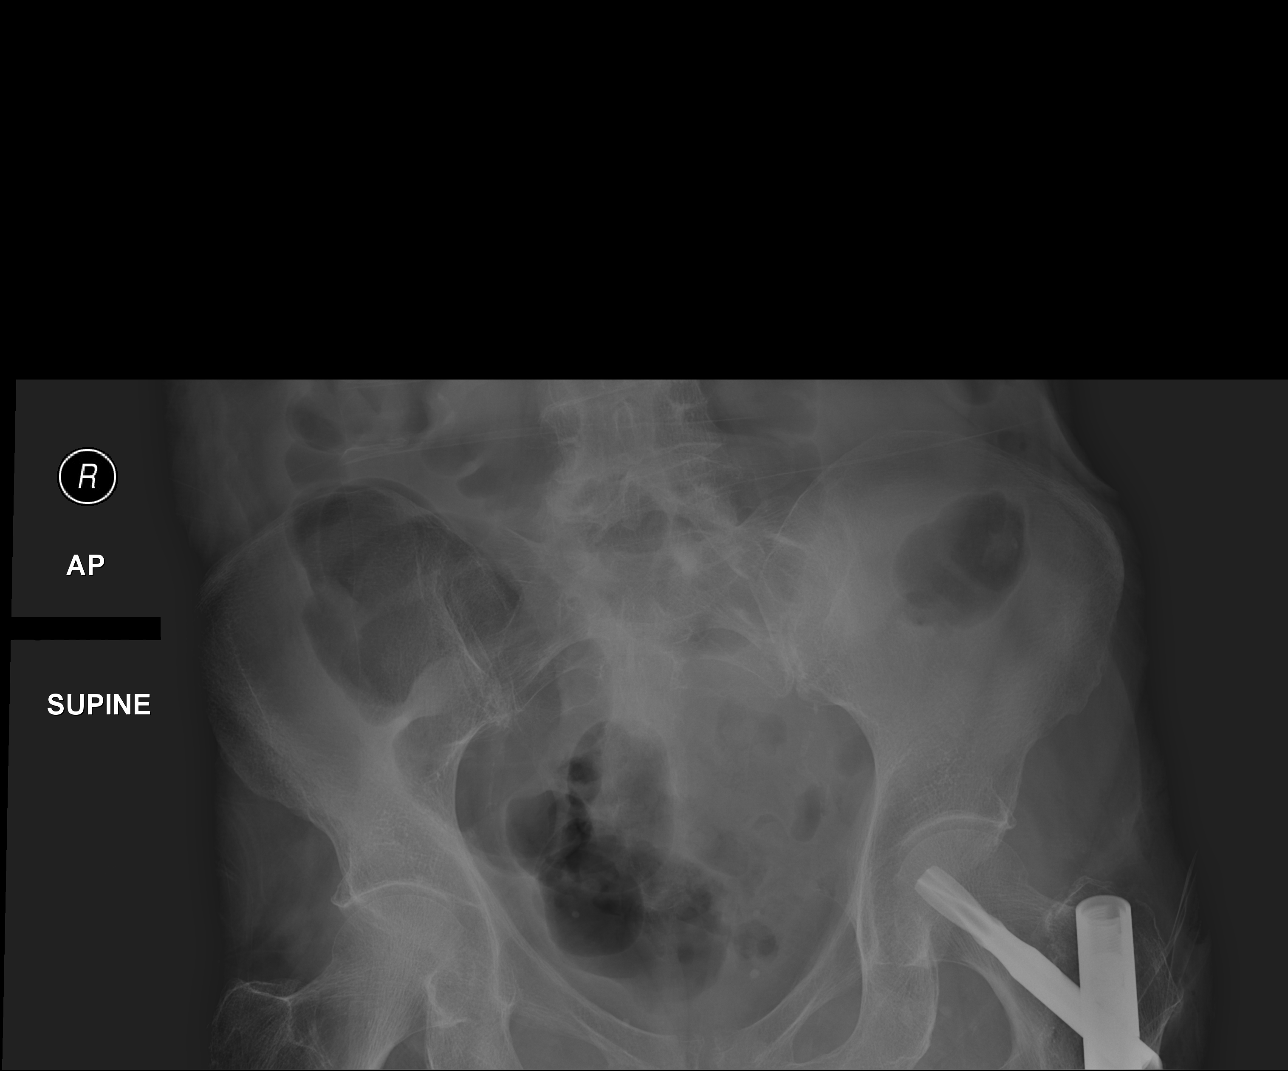

[AP (2 of 3)]
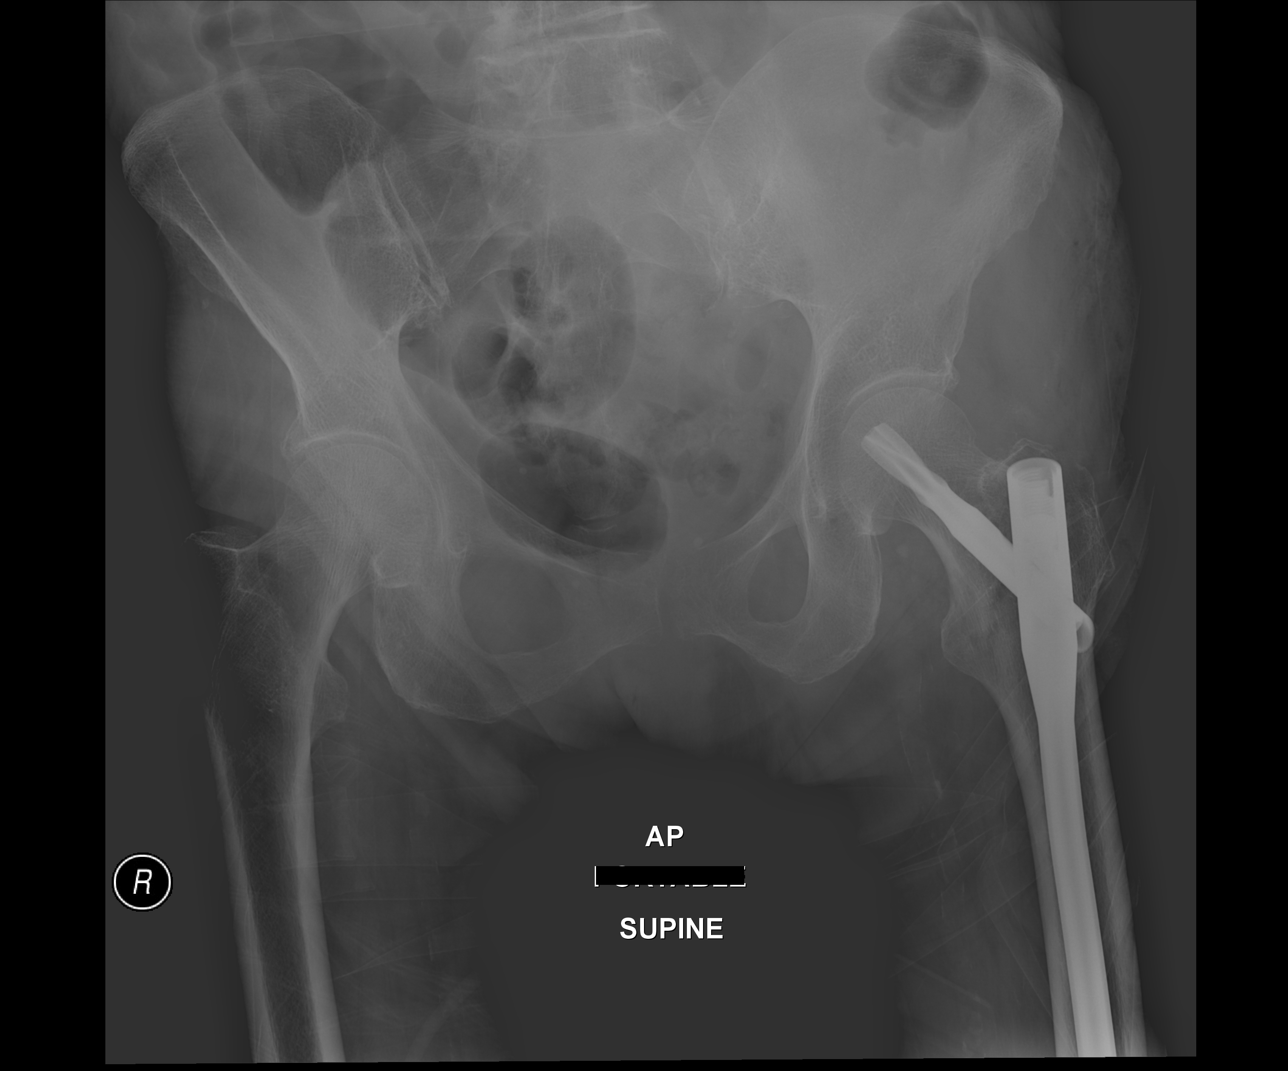

[AP (3 of 3)]
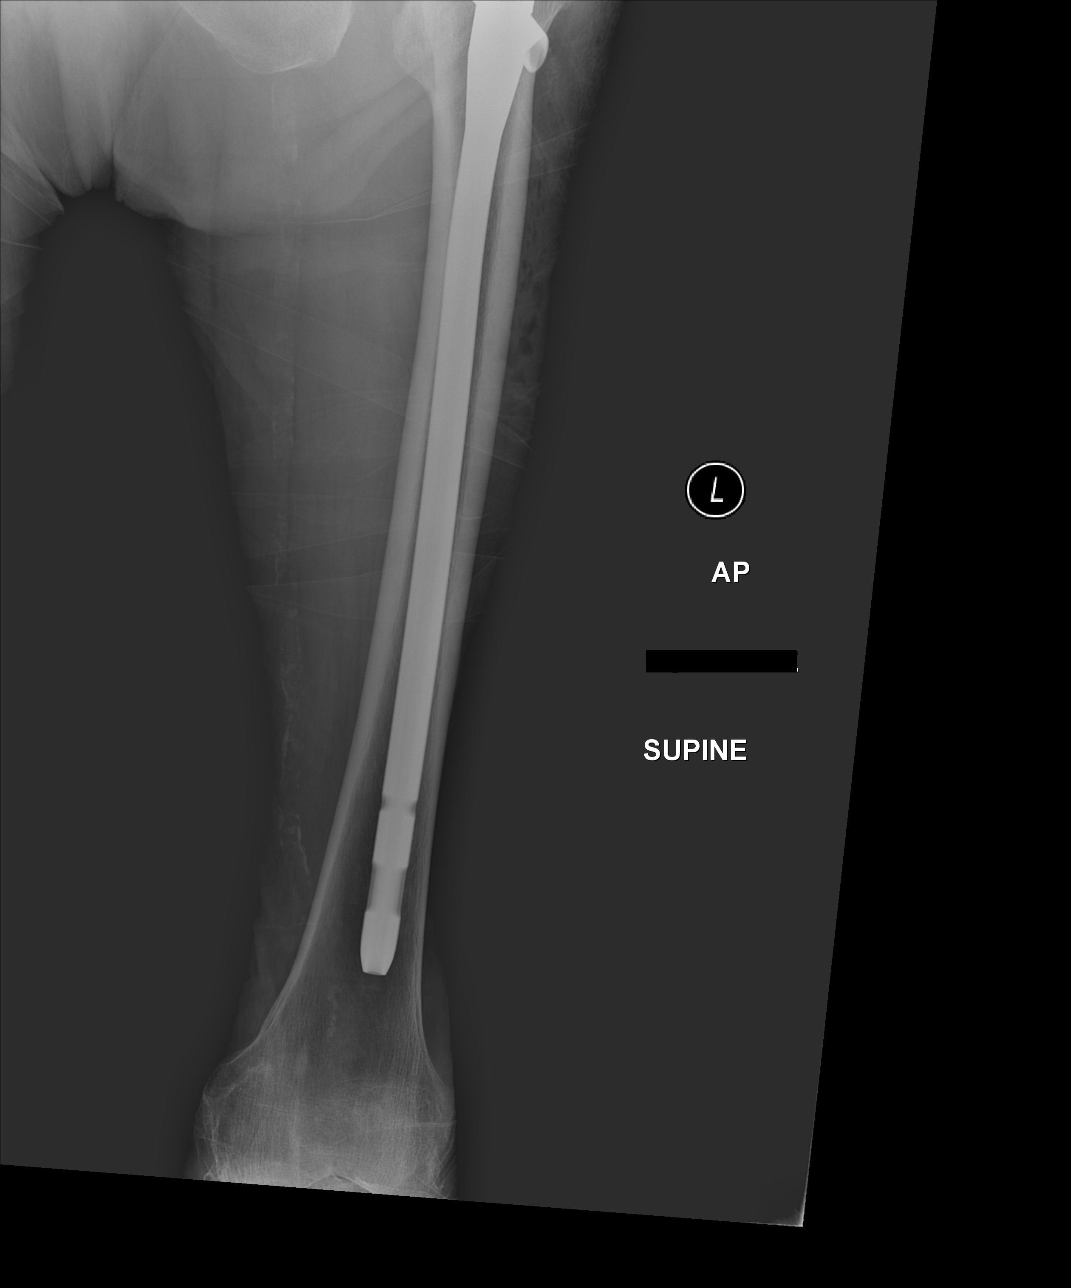

[3 of 3 positions shown; findings below may reference images not displayed]

FINDINGS: Portable pelvis:  AP portable supine views of the pelvis.  Left
femoral intramedullary rod with proximal interlocking dynamic hip
screw traverse the intertrochanteric fracture.  Near anatomic
alignment.  Postoperative changes to the surrounding soft tissues
including subcutaneous gas.  Calcified atherosclerosis.
Osteopenia.  Pelvis appears stable and intact.

Portable left hip:  Portable cross-table lateral view the left hip.
Good alignment at the intertrochanteric fracture.  Left femoral
intramedullary rod intact.
IMPRESSION: Left femoral arthroplasty with near anatomic alignment at the
intertrochanteric fracture and no adverse features.
